# Patient Record
Sex: Female | Born: 1950 | Race: White | Hispanic: No | Marital: Single | Smoking: Former smoker
Health system: Southern US, Community
[De-identification: ages and names within clinical notes are randomized; demographics above are authoritative.]

## PROBLEM LIST (undated history)

## (undated) DIAGNOSIS — C9590 Leukemia, unspecified not having achieved remission: Secondary | ICD-10-CM

## (undated) DIAGNOSIS — F329 Major depressive disorder, single episode, unspecified: Secondary | ICD-10-CM

## (undated) DIAGNOSIS — I2510 Atherosclerotic heart disease of native coronary artery without angina pectoris: Secondary | ICD-10-CM

## (undated) DIAGNOSIS — S42309A Unspecified fracture of shaft of humerus, unspecified arm, initial encounter for closed fracture: Secondary | ICD-10-CM

## (undated) DIAGNOSIS — E7800 Pure hypercholesterolemia, unspecified: Secondary | ICD-10-CM

## (undated) DIAGNOSIS — F209 Schizophrenia, unspecified: Secondary | ICD-10-CM

## (undated) DIAGNOSIS — E039 Hypothyroidism, unspecified: Secondary | ICD-10-CM

## (undated) DIAGNOSIS — F32A Depression, unspecified: Secondary | ICD-10-CM

## (undated) DIAGNOSIS — R569 Unspecified convulsions: Secondary | ICD-10-CM

## (undated) DIAGNOSIS — I219 Acute myocardial infarction, unspecified: Secondary | ICD-10-CM

## (undated) DIAGNOSIS — G40909 Epilepsy, unspecified, not intractable, without status epilepticus: Secondary | ICD-10-CM

## (undated) DIAGNOSIS — F29 Unspecified psychosis not due to a substance or known physiological condition: Secondary | ICD-10-CM

## (undated) DIAGNOSIS — G4700 Insomnia, unspecified: Secondary | ICD-10-CM

## (undated) DIAGNOSIS — M79603 Pain in arm, unspecified: Secondary | ICD-10-CM

## (undated) DIAGNOSIS — J449 Chronic obstructive pulmonary disease, unspecified: Secondary | ICD-10-CM

## (undated) DIAGNOSIS — G8929 Other chronic pain: Secondary | ICD-10-CM

## (undated) DIAGNOSIS — G249 Dystonia, unspecified: Secondary | ICD-10-CM

## (undated) HISTORY — PX: EXTERNAL EAR SURGERY: SHX627

## (undated) HISTORY — PX: HUMERUS FRACTURE SURGERY: SHX670

## (undated) HISTORY — PX: SHOULDER SURGERY: SHX246

---

## 1999-12-07 ENCOUNTER — Emergency Department (HOSPITAL_COMMUNITY): Admission: EM | Admit: 1999-12-07 | Discharge: 1999-12-07 | Payer: Self-pay | Admitting: Emergency Medicine

## 1999-12-17 ENCOUNTER — Encounter: Payer: Self-pay | Admitting: Emergency Medicine

## 1999-12-17 ENCOUNTER — Emergency Department (HOSPITAL_COMMUNITY): Admission: EM | Admit: 1999-12-17 | Discharge: 1999-12-18 | Payer: Self-pay | Admitting: Emergency Medicine

## 2000-02-05 ENCOUNTER — Emergency Department (HOSPITAL_COMMUNITY): Admission: EM | Admit: 2000-02-05 | Discharge: 2000-02-05 | Payer: Self-pay | Admitting: Emergency Medicine

## 2000-03-05 ENCOUNTER — Emergency Department (HOSPITAL_COMMUNITY): Admission: EM | Admit: 2000-03-05 | Discharge: 2000-03-05 | Payer: Self-pay | Admitting: Emergency Medicine

## 2000-04-02 ENCOUNTER — Encounter: Payer: Self-pay | Admitting: Emergency Medicine

## 2000-04-02 ENCOUNTER — Emergency Department (HOSPITAL_COMMUNITY): Admission: EM | Admit: 2000-04-02 | Discharge: 2000-04-02 | Payer: Self-pay | Admitting: Emergency Medicine

## 2000-05-11 ENCOUNTER — Emergency Department (HOSPITAL_COMMUNITY): Admission: EM | Admit: 2000-05-11 | Discharge: 2000-05-11 | Payer: Self-pay | Admitting: Emergency Medicine

## 2000-05-12 ENCOUNTER — Emergency Department (HOSPITAL_COMMUNITY): Admission: EM | Admit: 2000-05-12 | Discharge: 2000-05-12 | Payer: Self-pay | Admitting: Emergency Medicine

## 2000-05-24 ENCOUNTER — Emergency Department (HOSPITAL_COMMUNITY): Admission: EM | Admit: 2000-05-24 | Discharge: 2000-05-24 | Payer: Self-pay | Admitting: Emergency Medicine

## 2000-05-30 ENCOUNTER — Encounter: Payer: Self-pay | Admitting: Emergency Medicine

## 2000-05-30 ENCOUNTER — Emergency Department (HOSPITAL_COMMUNITY): Admission: EM | Admit: 2000-05-30 | Discharge: 2000-05-30 | Payer: Self-pay | Admitting: Emergency Medicine

## 2000-06-06 ENCOUNTER — Ambulatory Visit (HOSPITAL_COMMUNITY): Admission: RE | Admit: 2000-06-06 | Discharge: 2000-06-06 | Payer: Self-pay | Admitting: Family Medicine

## 2000-06-20 ENCOUNTER — Emergency Department (HOSPITAL_COMMUNITY): Admission: EM | Admit: 2000-06-20 | Discharge: 2000-06-20 | Payer: Self-pay | Admitting: Emergency Medicine

## 2000-06-27 ENCOUNTER — Inpatient Hospital Stay (HOSPITAL_COMMUNITY): Admission: EM | Admit: 2000-06-27 | Discharge: 2000-07-03 | Payer: Self-pay | Admitting: *Deleted

## 2000-07-04 ENCOUNTER — Emergency Department (HOSPITAL_COMMUNITY): Admission: EM | Admit: 2000-07-04 | Discharge: 2000-07-04 | Payer: Self-pay | Admitting: Emergency Medicine

## 2000-07-19 ENCOUNTER — Emergency Department (HOSPITAL_COMMUNITY): Admission: EM | Admit: 2000-07-19 | Discharge: 2000-07-19 | Payer: Self-pay

## 2000-09-19 ENCOUNTER — Emergency Department (HOSPITAL_COMMUNITY): Admission: EM | Admit: 2000-09-19 | Discharge: 2000-09-19 | Payer: Self-pay | Admitting: Emergency Medicine

## 2000-11-10 ENCOUNTER — Inpatient Hospital Stay (HOSPITAL_COMMUNITY): Admission: AD | Admit: 2000-11-10 | Discharge: 2000-11-21 | Payer: Self-pay | Admitting: *Deleted

## 2000-11-28 ENCOUNTER — Emergency Department (HOSPITAL_COMMUNITY): Admission: EM | Admit: 2000-11-28 | Discharge: 2000-11-28 | Payer: Self-pay

## 2000-12-18 ENCOUNTER — Observation Stay (HOSPITAL_COMMUNITY): Admission: EM | Admit: 2000-12-18 | Discharge: 2000-12-19 | Payer: Self-pay

## 2001-08-06 ENCOUNTER — Emergency Department (HOSPITAL_COMMUNITY): Admission: EM | Admit: 2001-08-06 | Discharge: 2001-08-06 | Payer: Self-pay | Admitting: Emergency Medicine

## 2001-08-08 ENCOUNTER — Inpatient Hospital Stay (HOSPITAL_COMMUNITY): Admission: EM | Admit: 2001-08-08 | Discharge: 2001-08-14 | Payer: Self-pay | Admitting: Emergency Medicine

## 2001-08-14 ENCOUNTER — Inpatient Hospital Stay (HOSPITAL_COMMUNITY): Admission: EM | Admit: 2001-08-14 | Discharge: 2001-08-30 | Payer: Self-pay | Admitting: Psychiatry

## 2001-08-17 ENCOUNTER — Encounter: Payer: Self-pay | Admitting: Emergency Medicine

## 2001-11-23 ENCOUNTER — Encounter: Admission: RE | Admit: 2001-11-23 | Discharge: 2002-02-21 | Payer: Self-pay | Admitting: Neurology

## 2002-07-23 ENCOUNTER — Encounter: Admission: RE | Admit: 2002-07-23 | Discharge: 2002-07-23 | Payer: Self-pay | Admitting: Sports Medicine

## 2002-07-30 ENCOUNTER — Encounter (INDEPENDENT_AMBULATORY_CARE_PROVIDER_SITE_OTHER): Payer: Self-pay | Admitting: Cardiology

## 2002-07-30 ENCOUNTER — Ambulatory Visit: Admission: RE | Admit: 2002-07-30 | Discharge: 2002-07-30 | Payer: Self-pay | Admitting: Sports Medicine

## 2002-08-13 ENCOUNTER — Encounter: Admission: RE | Admit: 2002-08-13 | Discharge: 2002-08-13 | Payer: Self-pay | Admitting: Sports Medicine

## 2002-09-10 ENCOUNTER — Encounter: Admission: RE | Admit: 2002-09-10 | Discharge: 2002-09-10 | Payer: Self-pay | Admitting: Sports Medicine

## 2002-09-17 ENCOUNTER — Encounter: Payer: Self-pay | Admitting: Oral Surgery

## 2002-09-17 ENCOUNTER — Ambulatory Visit (HOSPITAL_COMMUNITY): Admission: RE | Admit: 2002-09-17 | Discharge: 2002-09-17 | Payer: Self-pay | Admitting: Oral Surgery

## 2002-10-24 ENCOUNTER — Encounter: Payer: Self-pay | Admitting: Oral Surgery

## 2002-10-29 ENCOUNTER — Ambulatory Visit (HOSPITAL_COMMUNITY): Admission: RE | Admit: 2002-10-29 | Discharge: 2002-10-29 | Payer: Self-pay | Admitting: Oral Surgery

## 2002-11-01 ENCOUNTER — Ambulatory Visit (HOSPITAL_COMMUNITY): Admission: RE | Admit: 2002-11-01 | Discharge: 2002-11-01 | Payer: Self-pay | Admitting: Oral Surgery

## 2003-02-05 ENCOUNTER — Encounter: Admission: RE | Admit: 2003-02-05 | Discharge: 2003-02-05 | Payer: Self-pay | Admitting: Family Medicine

## 2003-03-25 ENCOUNTER — Encounter: Admission: RE | Admit: 2003-03-25 | Discharge: 2003-03-25 | Payer: Self-pay | Admitting: Sports Medicine

## 2003-04-15 ENCOUNTER — Encounter: Admission: RE | Admit: 2003-04-15 | Discharge: 2003-04-15 | Payer: Self-pay | Admitting: Sports Medicine

## 2003-05-15 ENCOUNTER — Inpatient Hospital Stay (HOSPITAL_COMMUNITY): Admission: EM | Admit: 2003-05-15 | Discharge: 2003-05-18 | Payer: Self-pay | Admitting: Emergency Medicine

## 2003-06-17 ENCOUNTER — Emergency Department (HOSPITAL_COMMUNITY): Admission: EM | Admit: 2003-06-17 | Discharge: 2003-06-17 | Payer: Self-pay | Admitting: Emergency Medicine

## 2003-06-26 ENCOUNTER — Encounter: Admission: RE | Admit: 2003-06-26 | Discharge: 2003-06-26 | Payer: Self-pay | Admitting: Sports Medicine

## 2003-07-03 ENCOUNTER — Ambulatory Visit (HOSPITAL_COMMUNITY): Admission: RE | Admit: 2003-07-03 | Discharge: 2003-07-03 | Payer: Self-pay | Admitting: Oral Surgery

## 2003-07-15 ENCOUNTER — Encounter: Admission: RE | Admit: 2003-07-15 | Discharge: 2003-07-15 | Payer: Self-pay | Admitting: Sports Medicine

## 2003-08-20 ENCOUNTER — Encounter: Admission: RE | Admit: 2003-08-20 | Discharge: 2003-08-20 | Payer: Self-pay | Admitting: Family Medicine

## 2003-10-24 ENCOUNTER — Encounter: Admission: RE | Admit: 2003-10-24 | Discharge: 2003-10-24 | Payer: Self-pay | Admitting: Family Medicine

## 2003-11-19 ENCOUNTER — Encounter: Admission: RE | Admit: 2003-11-19 | Discharge: 2003-11-19 | Payer: Self-pay | Admitting: Sports Medicine

## 2004-02-19 ENCOUNTER — Emergency Department (HOSPITAL_COMMUNITY): Admission: EM | Admit: 2004-02-19 | Discharge: 2004-02-19 | Payer: Self-pay | Admitting: Emergency Medicine

## 2004-04-13 ENCOUNTER — Ambulatory Visit: Payer: Self-pay | Admitting: Sports Medicine

## 2004-05-05 ENCOUNTER — Ambulatory Visit: Payer: Self-pay | Admitting: Sports Medicine

## 2004-10-13 ENCOUNTER — Ambulatory Visit: Payer: Self-pay | Admitting: Sports Medicine

## 2004-12-17 ENCOUNTER — Emergency Department (HOSPITAL_COMMUNITY): Admission: EM | Admit: 2004-12-17 | Discharge: 2004-12-17 | Payer: Self-pay | Admitting: Family Medicine

## 2005-07-14 ENCOUNTER — Emergency Department (HOSPITAL_COMMUNITY): Admission: EM | Admit: 2005-07-14 | Discharge: 2005-07-14 | Payer: Self-pay | Admitting: Family Medicine

## 2005-08-09 ENCOUNTER — Encounter (INDEPENDENT_AMBULATORY_CARE_PROVIDER_SITE_OTHER): Payer: Self-pay | Admitting: *Deleted

## 2005-08-09 LAB — CONVERTED CEMR LAB

## 2005-08-18 ENCOUNTER — Emergency Department (HOSPITAL_COMMUNITY): Admission: EM | Admit: 2005-08-18 | Discharge: 2005-08-18 | Payer: Self-pay | Admitting: Family Medicine

## 2005-08-23 ENCOUNTER — Ambulatory Visit: Payer: Self-pay | Admitting: Sports Medicine

## 2005-08-23 ENCOUNTER — Encounter (INDEPENDENT_AMBULATORY_CARE_PROVIDER_SITE_OTHER): Payer: Self-pay | Admitting: Sports Medicine

## 2005-09-12 ENCOUNTER — Emergency Department (HOSPITAL_COMMUNITY): Admission: EM | Admit: 2005-09-12 | Discharge: 2005-09-12 | Payer: Self-pay | Admitting: Family Medicine

## 2005-10-28 ENCOUNTER — Emergency Department (HOSPITAL_COMMUNITY): Admission: EM | Admit: 2005-10-28 | Discharge: 2005-10-28 | Payer: Self-pay | Admitting: Emergency Medicine

## 2006-03-09 ENCOUNTER — Ambulatory Visit (HOSPITAL_BASED_OUTPATIENT_CLINIC_OR_DEPARTMENT_OTHER): Admission: RE | Admit: 2006-03-09 | Discharge: 2006-03-09 | Payer: Self-pay | Admitting: Urology

## 2006-06-08 DIAGNOSIS — F209 Schizophrenia, unspecified: Secondary | ICD-10-CM | POA: Insufficient documentation

## 2006-06-08 DIAGNOSIS — E039 Hypothyroidism, unspecified: Secondary | ICD-10-CM

## 2006-06-09 ENCOUNTER — Encounter (INDEPENDENT_AMBULATORY_CARE_PROVIDER_SITE_OTHER): Payer: Self-pay | Admitting: *Deleted

## 2006-12-07 ENCOUNTER — Telehealth: Payer: Self-pay | Admitting: *Deleted

## 2006-12-14 ENCOUNTER — Encounter: Payer: Self-pay | Admitting: Family Medicine

## 2007-04-25 ENCOUNTER — Telehealth: Payer: Self-pay | Admitting: *Deleted

## 2007-07-11 ENCOUNTER — Ambulatory Visit: Payer: Self-pay | Admitting: Family Medicine

## 2007-07-11 LAB — CONVERTED CEMR LAB
ALT: 28 units/L (ref 0–35)
AST: 27 units/L (ref 0–37)
CO2: 23 meq/L (ref 19–32)
Calcium: 9.8 mg/dL (ref 8.4–10.5)
Chloride: 107 meq/L (ref 96–112)
Creatinine, Ser: 0.95 mg/dL (ref 0.40–1.20)
Potassium: 4.4 meq/L (ref 3.5–5.3)
Sodium: 139 meq/L (ref 135–145)
TSH: 9.99 microintl units/mL — ABNORMAL HIGH (ref 0.350–5.50)
Total Protein: 7.1 g/dL (ref 6.0–8.3)

## 2007-07-12 ENCOUNTER — Telehealth: Payer: Self-pay | Admitting: Family Medicine

## 2007-07-12 ENCOUNTER — Encounter: Payer: Self-pay | Admitting: Family Medicine

## 2007-08-06 ENCOUNTER — Encounter: Payer: Self-pay | Admitting: Family Medicine

## 2007-08-21 ENCOUNTER — Encounter: Payer: Self-pay | Admitting: Family Medicine

## 2007-09-05 ENCOUNTER — Ambulatory Visit: Payer: Self-pay | Admitting: Family Medicine

## 2007-09-24 ENCOUNTER — Encounter: Payer: Self-pay | Admitting: Family Medicine

## 2008-07-25 ENCOUNTER — Telehealth: Payer: Self-pay | Admitting: *Deleted

## 2008-07-31 ENCOUNTER — Encounter: Payer: Self-pay | Admitting: Sports Medicine

## 2008-07-31 ENCOUNTER — Ambulatory Visit: Payer: Self-pay | Admitting: Family Medicine

## 2008-07-31 LAB — CONVERTED CEMR LAB
Cholesterol: 246 mg/dL — ABNORMAL HIGH (ref 0–200)
HDL: 47 mg/dL (ref >39)
LDL Cholesterol: 140 mg/dL — ABNORMAL HIGH (ref 0–99)
TSH: 9.23 microintl units/mL — ABNORMAL HIGH (ref 0.350–4.500)
Total CHOL/HDL Ratio: 5.2
Triglycerides: 297 mg/dL — ABNORMAL HIGH (ref <150)
VLDL: 59 mg/dL — ABNORMAL HIGH (ref 0–40)

## 2008-08-11 ENCOUNTER — Encounter: Payer: Self-pay | Admitting: Family Medicine

## 2008-09-04 ENCOUNTER — Encounter: Payer: Self-pay | Admitting: Family Medicine

## 2008-10-15 ENCOUNTER — Telehealth: Payer: Self-pay | Admitting: *Deleted

## 2008-10-21 ENCOUNTER — Ambulatory Visit: Payer: Self-pay | Admitting: Family Medicine

## 2008-10-21 ENCOUNTER — Encounter: Payer: Self-pay | Admitting: Sports Medicine

## 2008-10-22 LAB — CONVERTED CEMR LAB: TSH: 0.554 u[IU]/mL

## 2008-11-13 ENCOUNTER — Encounter: Payer: Self-pay | Admitting: Family Medicine

## 2008-12-02 ENCOUNTER — Encounter: Payer: Self-pay | Admitting: Sports Medicine

## 2008-12-02 ENCOUNTER — Ambulatory Visit: Payer: Self-pay | Admitting: Family Medicine

## 2008-12-05 LAB — CONVERTED CEMR LAB: TSH: 9.364 u[IU]/mL — ABNORMAL HIGH (ref 0.350–4.500)

## 2008-12-09 ENCOUNTER — Ambulatory Visit: Payer: Self-pay | Admitting: Family Medicine

## 2008-12-09 ENCOUNTER — Encounter: Payer: Self-pay | Admitting: Sports Medicine

## 2008-12-10 LAB — CONVERTED CEMR LAB: TSH: 4.939 microintl units/mL — ABNORMAL HIGH (ref 0.350–4.500)

## 2009-01-19 ENCOUNTER — Ambulatory Visit: Payer: Self-pay | Admitting: Family Medicine

## 2009-01-19 ENCOUNTER — Encounter: Payer: Self-pay | Admitting: Sports Medicine

## 2009-01-19 LAB — CONVERTED CEMR LAB
BUN: 20 mg/dL (ref 6–23)
CO2: 21 meq/L (ref 19–32)
Calcium: 9.3 mg/dL (ref 8.4–10.5)
Chloride: 106 meq/L (ref 96–112)
Creatinine, Ser: 0.82 mg/dL (ref 0.40–1.20)
Glucose, Bld: 94 mg/dL (ref 70–99)
Potassium: 4.4 meq/L (ref 3.5–5.3)
Sodium: 141 meq/L (ref 135–145)
TSH: 0.516 microintl units/mL (ref 0.350–4.500)

## 2009-03-09 ENCOUNTER — Telehealth (INDEPENDENT_AMBULATORY_CARE_PROVIDER_SITE_OTHER): Payer: Self-pay | Admitting: Family Medicine

## 2009-07-23 ENCOUNTER — Emergency Department (HOSPITAL_COMMUNITY): Admission: EM | Admit: 2009-07-23 | Discharge: 2009-07-23 | Payer: Self-pay | Admitting: Family Medicine

## 2009-08-11 ENCOUNTER — Encounter: Admission: RE | Admit: 2009-08-11 | Discharge: 2009-08-11 | Payer: Self-pay | Admitting: Orthopedic Surgery

## 2009-09-08 ENCOUNTER — Ambulatory Visit: Payer: Self-pay | Admitting: Family Medicine

## 2009-09-08 ENCOUNTER — Encounter: Payer: Self-pay | Admitting: Family Medicine

## 2009-09-08 LAB — CONVERTED CEMR LAB
Glucose, Urine, Semiquant: NEGATIVE
Nitrite: NEGATIVE
Specific Gravity, Urine: >=1.03
Urobilinogen, UA: 1
pH: 5.5

## 2009-09-16 ENCOUNTER — Encounter: Payer: Self-pay | Admitting: Sports Medicine

## 2009-09-16 ENCOUNTER — Ambulatory Visit: Payer: Self-pay | Admitting: Family Medicine

## 2009-09-16 LAB — CONVERTED CEMR LAB
Bilirubin Urine: NEGATIVE
Blood in Urine, dipstick: NEGATIVE
Glucose, Urine, Semiquant: NEGATIVE
Ketones, urine, test strip: NEGATIVE
Nitrite: NEGATIVE
Protein, U semiquant: NEGATIVE
Specific Gravity, Urine: 1.025
TSH: 0.183 microintl units/mL — ABNORMAL LOW (ref 0.350–4.500)
Urobilinogen, UA: 0.2
WBC Urine, dipstick: NEGATIVE
pH: 6

## 2009-09-24 ENCOUNTER — Telehealth: Payer: Self-pay | Admitting: Sports Medicine

## 2009-10-29 ENCOUNTER — Ambulatory Visit: Payer: Self-pay | Admitting: Family Medicine

## 2009-11-03 ENCOUNTER — Telehealth: Payer: Self-pay | Admitting: Sports Medicine

## 2009-11-04 ENCOUNTER — Ambulatory Visit: Payer: Self-pay | Admitting: Family Medicine

## 2009-11-04 ENCOUNTER — Encounter: Payer: Self-pay | Admitting: Family Medicine

## 2009-11-05 ENCOUNTER — Telehealth: Payer: Self-pay | Admitting: *Deleted

## 2009-11-24 ENCOUNTER — Ambulatory Visit: Payer: Self-pay | Admitting: Family Medicine

## 2009-11-26 ENCOUNTER — Encounter: Admission: RE | Admit: 2009-11-26 | Discharge: 2009-11-26 | Payer: Self-pay | Admitting: Orthopedic Surgery

## 2009-12-07 ENCOUNTER — Encounter: Payer: Self-pay | Admitting: Sports Medicine

## 2009-12-07 ENCOUNTER — Ambulatory Visit: Payer: Self-pay | Admitting: Family Medicine

## 2009-12-07 LAB — CONVERTED CEMR LAB: TSH: 2.857 microintl units/mL (ref 0.350–4.500)

## 2009-12-08 ENCOUNTER — Encounter: Admission: RE | Admit: 2009-12-08 | Discharge: 2009-12-08 | Payer: Self-pay | Admitting: Family Medicine

## 2009-12-09 ENCOUNTER — Encounter: Payer: Self-pay | Admitting: *Deleted

## 2009-12-22 ENCOUNTER — Ambulatory Visit (HOSPITAL_COMMUNITY): Admission: RE | Admit: 2009-12-22 | Discharge: 2009-12-22 | Payer: Self-pay | Admitting: Sports Medicine

## 2009-12-22 ENCOUNTER — Encounter: Payer: Self-pay | Admitting: Sports Medicine

## 2009-12-24 ENCOUNTER — Encounter: Payer: Self-pay | Admitting: *Deleted

## 2009-12-25 ENCOUNTER — Encounter: Payer: Self-pay | Admitting: Sports Medicine

## 2009-12-25 ENCOUNTER — Ambulatory Visit: Payer: Self-pay | Admitting: Family Medicine

## 2009-12-25 LAB — CONVERTED CEMR LAB: Pro B Natriuretic peptide (BNP): 9.8 pg/mL (ref 0.0–100.0)

## 2010-01-20 ENCOUNTER — Encounter: Payer: Self-pay | Admitting: Sports Medicine

## 2010-02-10 ENCOUNTER — Emergency Department (HOSPITAL_COMMUNITY)
Admission: EM | Admit: 2010-02-10 | Discharge: 2010-02-13 | Payer: Self-pay | Source: Home / Self Care | Admitting: Emergency Medicine

## 2010-02-15 ENCOUNTER — Encounter: Payer: Self-pay | Admitting: Sports Medicine

## 2010-02-27 ENCOUNTER — Ambulatory Visit: Payer: Self-pay | Admitting: Psychiatry

## 2010-04-20 ENCOUNTER — Encounter: Payer: Self-pay | Admitting: Sports Medicine

## 2010-05-11 NOTE — Miscellaneous (Signed)
  Clinical Lists Changes  Problems: Removed problem of OTHER DISEASES OF RESPIRATORY SYSTEM NEC (ICD-519.8) Removed problem of UTI (ICD-599.0) Removed problem of SHOULDER PAIN (ICD-719.41) Removed problem of DYSURIA (ICD-788.1) Removed problem of MUSCLE SPASM (ICD-728.85) Removed problem of UNSPECIFIED DISORER NERVOUS SYSTEM&SENSE ORGANS (ICD-V12.40)

## 2010-05-11 NOTE — Assessment & Plan Note (Signed)
Summary: ? UTI & Bronchitis,tcb   Vital Signs:  Patient profile:   60 year old female Weight:      188.8 pounds Temp:     98.8 degrees F oral Pulse rate:   101 / minute Pulse rhythm:   regular BP sitting:   109 / 70  (left arm) Cuff size:   regular  Vitals Entered By: Loralee Pacas CMA (Sep 08, 2009 11:28 AM)  Primary Care Provider:  Rodney Langton MD   History of Present Illness: Bruning with urination, history of urological problems.  Followed in past by [place] MD in [facility], CMG test reported normal one year ago.  Past bladder sling procedure, had been on prophylaxis antibiotics in past.  Denies fever, vomiting, or abdominal pain.  Flexeril not effective in controlling pain from shoulder injury.  She describes it as 'tonic/clonic spasms", she would like to try another muscle relaxant.  She would like to see Dr. Albertha Ghee at [facility], as she knows her from the past and would like her to review her films for a second opinion.  Has another orthopod who told her there is noting that can be done.  She wears an immobilizer.  She believes that she has bronchitis.  She has cough and congestion for weeks.    Current Medications (verified): 1)  Levothroid 125 Mcg Tabs (Levothyroxine Sodium) .... One Tab By Mouth Daily 2)  Zocor 80 Mg Tabs (Simvastatin) .... One Tab By Mouth At Bedtime 3)  Albuterol 90 Mcg/act  Aers (Albuterol) .Marland Kitchen.. 1-2 Puffs Up To Three Times A Day Prn 4)  Neurontin .... 900mg  By Mouth Three Times A Day 5)  Cogentin 1 Mg/ml Soln (Benztropine Mesylate) .... 6 Mg By Mouth Once Daily 6)  Temazepam 15 Mg  Caps (Temazepam) .... One Tab By Mouth Qhs 7)  Abilify 20 Mg  Tabs (Aripiprazole) .Marland Kitchen.. 1 By Mouth Once Daily Per Psychiatry 8)  Effexor Xr 150 Mg  Cp24 (Venlafaxine Hcl) .... Two Times A Day Per Psychiatry 9)  Diphenhydramine .... 50mg  By Mouth Qam, 50mg  By Mouth Midday, 125 By Mouth Qhs 10)  Gas-X Extra Strength 125 Mg Chew (Simethicone) .... One Daily 11)   Valtrex 500 Mg Tabs (Valacyclovir Hcl) .... One Tab By Mouth Daily 12)  Skelaxin 800 Mg Tabs (Metaxalone) .... One Three Times A Day As Needed For Muscle Spasm 13)  Levaquin 500 Mg Tabs (Levofloxacin) .... One Daily For 7 Days 14)  Mucinex 600 Mg Xr12h-Tab (Guaifenesin) .... One Two Times A Day As Needed For Congestion, 1 Box  Allergies (verified): 1)  Amoxicillin (Amoxicillin)  Review of Systems General:  Denies fever. Resp:  Complains of cough, sputum productive, and wheezing.  Physical Exam  General:  Moderately anxious, alert, talkative 60 year old Lungs:  normal respiratory effort, expiratory wheeze, inspiratory rhonchi, frequent cough Heart:  normal rate and regular rhythm.   Genitalia:  catheterized for small amount of cloudy, reddish urine:  + WBC,  +RBC, sent for culture   Impression & Recommendations:  Problem # 1:  UTI (ICD-599.0)  Wanted to add something that would cover her chronic bronchitis and urine, subsequently Levaquin. Her updated medication list for this problem includes:    Levaquin 500 Mg Tabs (Levofloxacin) ..... 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 1-2 puffs up to three times a day prn  Orders: [facility]- Est  Level 4 (57846)  Problem # 3:  SHOULDER PAIN (ICD-719.41) per her request refered for second opinion to [facility], she has her films on dic in her purse The following medications were removed from the medication list:    Flexeril 5 Mg Tabs (Cyclobenzaprine hcl) ..... One tab by mouth tid Her updated medication list for this problem includes:    Skelaxin 800 Mg Tabs (Metaxalone) ..... One three times a day as needed for muscle spasm  Problem # 4:  MUSCLE SPASM (ICD-728.85) requested another relaxant, trial of skelaxin,  follow up with primary MD Orders: Orthopedic Referral (Ortho) [facility]- Est  Level 4 (96295)  Complete Medication List: 1)  Levothroid 125 Mcg Tabs (Levothyroxine sodium) .... One tab by mouth daily 2)  Zocor 80 Mg Tabs (Simvastatin) .... One tab by mouth at bedtime 3)  Albuterol 90 Mcg/act Aers (Albuterol) .Marland Kitchen.. 1-2 puffs up to three times a day prn 4)  Neurontin  .... 900mg  by mouth three times a day 5)  Cogentin 1 Mg/ml Soln (Benztropine mesylate) .... 6 mg by mouth once daily 6)  Temazepam 15 Mg Caps (Temazepam) .... One tab by mouth qhs 7)  Abilify 20 Mg Tabs (Aripiprazole) .Marland Kitchen.. 1 by mouth once daily per psychiatry 8)  Effexor Xr 150 Mg Cp24 (Venlafaxine hcl) .... Two times a day per psychiatry 9)  Diphenhydramine  .... 50mg  by mouth qam, 50mg  by mouth midday, 125 by mouth qhs 10)  Gas-x Extra Strength 125 Mg Chew (Simethicone) .... One daily 11)  Valtrex 500 Mg Tabs (Valacyclovir hcl) .... One tab by mouth daily 12)  Skelaxin 800 Mg Tabs (Metaxalone) .... One three times a day as needed for muscle spasm 13)  Levaquin 500 Mg Tabs (Levofloxacin) .... One daily for 7 days 14)  Mucinex 600 Mg Xr12h-tab (Guaifenesin) .... One two times a day as needed for congestion, 1 box  Other Orders: Urinalysis-[facility] (00000) Urine Culture-[facility] ([id number])  Patient Instructions: 1)  Mucinex to thin secreations 2)  Use inhaller every 4 hours 3)  Use all of antibiotics to treat your urinary infection and lungs 4)  Drink a lot of water-8-10 glasses per day 5)  Trial of skelaxin muscle relaxant 6)  Apt wtih Dr. Serena Colonel Prescriptions: SKELAXIN 800 MG TABS (METAXALONE) one three times a day as needed for muscle spasm Brand medically necessary #90 x 0   Entered and Authorized by:   Luretha Murphy NP   Signed by:   Luretha Murphy NP on 09/08/2009   Method used:   Printed then faxed to ...       [facility] (retail)       [address]       [place], [place]  [id number]       Ph:  [phone]       Fax: [phone]   RxID:   [id number] MUCINEX 600 MG XR12H-TAB (GUAIFENESIN) one two times a day as needed for congestion, 1 box Brand medically necessary #1 x 11   Entered and Authorized by:   Luretha Murphy NP   Signed by:   Luretha Murphy NP on 09/08/2009   Method used:   Printed then faxed to ...       [facility] (retail)       [address]       [place], [place]  [id number]  Ph: [phone]       Fax: [phone]   RxID:   [id number] LEVAQUIN 500 MG TABS (LEVOFLOXACIN) one daily for 7 days Brand medically necessary #7 x 0   Entered and Authorized by:   Luretha Murphy NP   Signed by:   Luretha Murphy NP on 09/08/2009   Method used:   Printed then faxed to ...       [facility] (retail)       [address]       [place], [place]  [address]       Ph: [phone]       Fax: [phone]   RxID:   [id number] SKELAXIN 800 MG TABS (METAXALONE) one three times a day as needed for muscle spasm  #90 x 0   Entered and Authorized by:   Luretha Murphy NP   Signed by:   Luretha Murphy NP on 09/08/2009   Method used:   Printed then faxed to ...       [facility] (retail)       [address]       [place], [place]  [address]       Ph: [phone]       Fax: [phone]   RxID:   [id number]   Laboratory Results   Urine Tests  Date/Time Received: Sep 08, 2009 11:31 AM  Date/Time Reported: Sep 08, 2009 12:12 PM   Routine Urinalysis   Color: brown Appearance: Cloudy Glucose: negative   (Normal Range: Negative) Bilirubin: small-icto negative   (Normal Range: Negative) Ketone: small (15)   (Normal Range: Negative) Spec. Gravity: >=1.030   (Normal Range: 1.003-1.035) Blood: large   (Normal Range: Negative) pH: 5.5   (Normal Range: 5.0-8.0) Protein: >=300   (Normal Range: Negative) Urobilinogen: 1.0   (Normal Range: 0-1) Nitrite: negative   (Normal Range:  Negative) Leukocyte Esterace: small   (Normal Range: Negative)  Urine Microscopic WBC/HPF: >20 RBC/HPF: >20 Bacteria/HPF: 3+ Epithelial/HPF: 5-10    Comments: cath sample. urine sent for culture ...........test performed by...........Marland KitchenTerese Door, CMA

## 2010-05-11 NOTE — Progress Notes (Signed)
----   Converted from flag ---- ---- 11/04/2009 7:18 PM, Jamie Brookes MD wrote: Pt needs more personal care services. She is already getting them for about 2 hours a day from [facility] [phone]). I have put in an order for her to get more. Let me know if there is anything else I need to do. Thanks ------------------------------  [id number] for a change of status for to be faxed over. Then can request more pt time. Jimmy Footman, CMA  Filled out papers and put on Jimmy Footman desk. Jamie Brookes MD  November 06, 2009 9:10 AM  faxed forms out.Jimmy Footman, CMA  November 06, 2009 10:43 AM     New Problems: FRACTURE, ARM, RIGHT (ICD-818.0)   New Problems: FRACTURE, ARM, RIGHT (ICD-818.0)

## 2010-05-11 NOTE — Letter (Signed)
Summary: Generic Letter  [facility]  [address]   [place], [place] [address]   Phone: [phone]  Fax: [phone]    12/09/2009  Dominique Acosta [address] [place], [place]  [address]  Dear Ms. Zimmermann,   I have made an appointment for you to have an 2D Echo done on 09.13.2011 at 10am at [facility]. You will need to report to 1st floor admitting at 945am BEFORE YOU GO TO HAVE YOUR ECHO DONE.  Should you have any questions  please call our office.  Thank you in advance for your time and attention to this matter.        Sincerely,   Loralee Pacas CMA

## 2010-05-11 NOTE — Assessment & Plan Note (Signed)
 Summary: thyroid /meds/kh   Vital Signs:  Patient profile:   60 year old female Weight:      194.3 pounds Temp:     98.7 degrees F oral Pulse rate:   99 / minute BP sitting:   107 / 69  (left arm)  Vitals Entered By: Letitia Reusing (October 21, 2008 2:26 PM) CC: thyroid  meds Is Patient Diabetic? No   Primary Care Provider:  Camie Mulch MD  CC:  thyroid  meds.  History of Present Illness: Pt comes in to discuss thyroid  meds.  Saw me in april 2010, I checked TSH, found it to still be high and increased levothyroxine  from 88mcg to 100mcg.  She did not follow up with PCP as desired, missed appt, and returns to me needing a refill of meds.  No palpitations, restlessness, heat intolerance, but does have fatigue, occasional constipation.  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 EOMI. Perrla. No lid lag. Neck:  No palpable masses. Lungs:  Normal respiratory effort, chest expands symmetrically. Lungs are clear to auscultation, no crackles or wheezes. Heart:  Normal rate and regular rhythm. S1 and S2 normal without gallop, murmur, click, rub or other extra sounds.   Impression & Recommendations:  Problem # 1:  HYPOTHYROIDISM, UNSPECIFIED (ICD-244.9) Assessment Improved Will check TSH today, increase  Levothyroidfrom 100 mcg to 125 micrograms if still elevated TSH.  Will also have pt come back in 6 weeks to check another TSH, then follow up with me AFTER that to readjust med dosage if needed.  Her updated medication list for this problem includes:    Levothroid 100 Mcg Tabs (Levothyroxine  sodium) ..... One tab by mouth daily  Orders: TSH-[facility] ([id number]) [facility]- Est Level  3 (00786)  Future Orders: TSH-[facility] ([id number]) ... 12/05/2008  Complete Medication List: 1)  Levothroid 100 Mcg Tabs (Levothyroxine  sodium) .... One tab by mouth daily 2)  Zocor  80 Mg Tabs (Simvastatin ) .... One tab by mouth at bedtime 3)  Albuterol  90 Mcg/act Aers (Albuterol ) .SABRA.. 1-2 puffs up to three times a day prn 4)  Neurontin   .... 900mg  by mouth three times a day 5)  Cogentin  1 Mg/ml Soln (Benztropine  mesylate) .... 6 mg by mouth once daily 6)  Temazepam  15 Mg Caps (Temazepam ) .... One tab by mouth qhs 7)  Abilify  20 Mg Tabs (Aripiprazole ) .SABRA.. 1 by mouth once daily per psychiatry 8)  Seroquel  Xr 150 Mg Xr24h-tab (Quetiapine  fumarate) .... One tab by mouth qhs 9)  Trazodone Hcl 100 Mg Tabs (Trazodone hcl) .... Per psychiatry 10)  Effexor  Xr 150 Mg Cp24 (Venlafaxine  hcl) .... Two times a day per psychiatry 11)  Diphenhydramine   .... 50mg  by mouth qam, 50mg  by mouth midday, 125 by mouth qhs 12)  Gas-x Extra Strength 125 Mg Chew (Simethicone) .... One daily 13)  Valtrex  500 Mg Tabs (Valacyclovir  hcl) .... One tab by mouth daily  Patient Instructions: 1)  Great to see you again today, 2)  We will check your TSH today, then I will call you and call in the new dosage of your Levothyroxine  if needed. 3)  Make an appt at the front to come back to see me in 6 weeks.  I would like you to come and make a lab appt just prior to your visit to check your TSH so that I know the results PRIOR to your appt. 4)  -Dr. ONEIDA.

## 2010-05-11 NOTE — Assessment & Plan Note (Signed)
 Summary: FU/KH   Vital Signs:  Patient profile:   60 year old female Height:      60 inches Weight:      192.2 pounds BMI:     37.67 Temp:     99.4 degrees F oral Pulse rate:   100 / minute BP sitting:   118 / 76  (left arm) Cuff size:   regular  Vitals Entered By: Chrissie Lerner CMA, (December 09, 2008 11:42 AM) CC: f/u Is Patient Diabetic? No Pain Assessment Patient in pain? no        Primary Care Provider:  Debby Petties MD  CC:  f/u.  History of Present Illness: 79F here for fu of hypothyroidism.   TSH was in the 9's, then dropped to 0.5 after taking levothyroid 100, now back to 9's.  Pt feels sluggish.  Denies skipping doses, states she has taken her levothyroid every day.  No other complaints.  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,  frequent uti--on trimethoprim  daily, --sees UROL Dr Roseann in [facility] Psych doctor at mental health  Past Surgical History: Last updated: 07/11/2007 Echo-normal - 08/20/2002,  tooth extraction--gen anaesthia - 09/10/2002  Family History: Last updated: 09/05/2007 cad--dad mother breast ca in her 74s  Social History: Last updated: 09/05/2007 Lives alone-[facility] housing ACT team visits 3x per week Father helps her with medicationepenses Smokes 2 ppd  Social History: Smoking Status:  never  Physical Exam  General:  Well-developed,well-nourished,in no acute distress; alert,appropriate and cooperative throughout examination Eyes:  No corneal or conjunctival inflammation noted. EOMI. Perrla. No lid lag. Lungs:  Normal respiratory effort, chest expands symmetrically. Lungs are clear to auscultation, no crackles or wheezes. Heart:   Normal rate and regular rhythm. S1 and S2 normal without gallop, murmur, click, rub or other extra sounds. Extremities:  No pretibial myxedema.   Impression & Recommendations:  Problem # 1:  HYPOTHYROIDISM, UNSPECIFIED (ICD-244.9) Assessment Deteriorated Unclear why TSH was normal, then immediately increased if pt was compliant.  Will recheck TSH today and refill levothyroid with 125mcg form.  If TSH still elevated then can just recheck in 6 weeks.  If normal then will go back to 100mcg form.  Her updated medication list for this problem includes:    Levothroid 125 Mcg Tabs (Levothyroxine  sodium) ..... One tab by mouth daily  Orders: TSH-[facility] ([id number]) [facility]- Est Level  3 ([address])Future Orders: TSH-[facility] ([id number]) ... 12/09/2009  Complete Medication List: 1)  Levothroid 125 Mcg Tabs (Levothyroxine  sodium) .... One tab by mouth daily 2)  Zocor  80 Mg Tabs (Simvastatin ) .... One tab by mouth at bedtime 3)  Albuterol  90 Mcg/act Aers (Albuterol ) .SABRA.. 1-2 puffs up to three times a day prn 4)  Neurontin   .... 900mg  by mouth three times a day 5)  Cogentin  1 Mg/ml Soln (Benztropine  mesylate) .... 6 mg by mouth once daily 6)  Temazepam  15 Mg Caps (Temazepam ) .... One tab by mouth qhs 7)  Abilify  20 Mg Tabs (Aripiprazole ) .SABRA.. 1 by mouth once daily per psychiatry 8)  Seroquel  Xr 150 Mg Xr24h-tab (Quetiapine  fumarate) .... One tab by mouth qhs 9)  Trazodone Hcl 100 Mg Tabs (Trazodone hcl) .... Per psychiatry 10)  Effexor  Xr 150 Mg Cp24 (Venlafaxine  hcl) .... Two times a day per psychiatry  11)  Diphenhydramine   .... 50mg  by mouth qam, 50mg  by mouth midday, 125 by mouth qhs 12)  Gas-x Extra Strength 125 Mg Chew (Simethicone) .... One daily 13)  Valtrex  500 Mg Tabs (Valacyclovir  hcl) .... One tab by mouth daily  Patient Instructions: 1)  Good to see you today, 2)  lets check another TSH to make sure there was no lab error and increase your medication to 125mcg daily. 3)  I want you to come  back for a lab visit in 6 weeks to check your TSH again to see how the dose has helped.  Go to the front to set this up. 4)  -Dr. ONEIDA. Prescriptions: LEVOTHROID 125 MCG TABS (LEVOTHYROXINE  SODIUM) One tab by mouth daily  #45 x 0   Entered and Authorized by:   Debby Petties MD   Signed by:   Debby Petties MD on 12/09/2008   Method used:   Electronically to        [facility] (retail)       [address]       [place], [place]  [id number]       Ph: [phone]       Fax: [phone]   RxID:   [id number]

## 2010-05-11 NOTE — Progress Notes (Signed)
Summary: triage  Phone Note Call from Patient Call back at Home Phone [phone]   Caller: Patient Summary of Call: Pt seen last week and told to call if not feeling better this week. Initial call taken by: Clydell Hakim,  November 03, 2009 3:46 PM  Follow-up for Phone Call        took the doxy. this is her last day & still has it. she thinks it is from the smoking & wants patches prescribed. work in tomorrow at 3 with Dr. Clotilde Dieter Follow-up by: Golden Circle RN,  November 03, 2009 3:46 PM  Additional Follow-up for Phone Call Additional follow up Details #1::        Noted, to be seen tomo. Additional Follow-up by: Rodney Langton MD,  November 03, 2009 9:53 PM

## 2010-05-11 NOTE — Assessment & Plan Note (Signed)
Summary: wants patches to stop smoking/[place]/t   Vital Signs:  Patient profile:   60 year old female Weight:      183 pounds Temp:     98.6 degrees F Pulse rate:   99 / minute BP sitting:   101 / 67  (left arm)  Vitals Entered By: Theresia Lo RN (November 04, 2009 3:21 PM) CC: talk about smoking cessation, f/u bronchitis   Primary Care Provider:  Rodney Langton MD  CC:  talk about smoking cessation and f/u bronchitis.  History of Present Illness: Smoking Cessation: Pt comes in today after having chronic bronchitis since Nov (per her report) and recently being on antibiotics again for bronchitis. She has continued to smoke and has actually increased her smoking in the past few months (death in family, mom put in nursing home) to 2.5 - 3 ppd. She has recently broken her arm and says some of the meds she is taking for the pain is making her smoke even more. She has tried to quit in the past and lasted 6 weeks. She has never asked for help before but wants to know about meds to help her quit. She has not had a cig since last night and says she is tired and irritable and really wants a cigarette. She is interested in the patches.   Chronic Bronchitis: Pt just finished 14 days of Doxy and is feeling like she still is sick. She continues to cough and says she feels like she is going to lapse back into an infection. No fevers, no chills, pos cough, pos fatigue.  Habits & Providers  Alcohol-Tobacco-Diet     Tobacco Status: current     Cigarette Packs/Day: 3.0  Current Medications (verified): 1)  Levothroid 112 Mcg Tabs (Levothyroxine Sodium) .... One Tab By Mouth Daily 2)  Zocor 80 Mg Tabs (Simvastatin) .... One Tab By Mouth At Bedtime 3)  Albuterol 90 Mcg/act  Aers (Albuterol) .Marland Kitchen.. 1-2 Puffs Up To Three Times A Day Prn 4)  Neurontin .... 900mg  By Mouth Three Times A Day 5)  Cogentin 1 Mg/ml Soln (Benztropine Mesylate) .... 6 Mg By Mouth Once Daily 6)  Temazepam 15 Mg  Caps (Temazepam)  .... One Tab By Mouth Qhs 7)  Abilify 20 Mg  Tabs (Aripiprazole) .Marland Kitchen.. 1 By Mouth Once Daily Per Psychiatry 8)  Effexor Xr 150 Mg  Cp24 (Venlafaxine Hcl) .... Two Times A Day Per Psychiatry 9)  Diphenhydramine .... 50mg  By Mouth Qam, 50mg  By Mouth Midday, 125 By Mouth Qhs 10)  Gas-X Extra Strength 125 Mg Chew (Simethicone) .... One Daily 11)  Valtrex 500 Mg Tabs (Valacyclovir Hcl) .... One Tab By Mouth Daily 12)  Skelaxin 800 Mg Tabs (Metaxalone) .... One Three Times A Day As Needed For Muscle Spasm 13)  Levaquin 500 Mg Tabs (Levofloxacin) .... One Daily For 7 Days 14)  Mucinex 600 Mg Xr12h-Tab (Guaifenesin) .... One Two Times A Day As Needed For Congestion, 1 Box 15)  Doxycycline Hyclate 100 Mg Caps (Doxycycline Hyclate) .... Take One Two Times A Day By Mouth 16)  Promethazine Hcl 12.5 Mg Tabs (Promethazine Hcl) .... Take One Q6 Hours As Needed Nausea 17)  Nicotine 21 Mg/24hr Pt24 (Nicotine) .... Place One Patch On Daily and Take The Other One Off, Do This For 6 Weeks. 18)  Ra Nicotine Polacrilex 4 Mg Lozg (Nicotine Polacrilex) .... Use 1 Lozenge Every 1-2 Hours, Continue For 6 Weeks.  Allergies (verified): 1)  ! Dilaudid (Hydromorphone Hcl) 2)  Amoxicillin (  Amoxicillin)  Family History: cad--dad mother breast ca in her 73s  Social History: Lives alone-[employer] housing ACT team visits 1x q2 weeks Father helps her with medication expenses Smokes 2.5-3 ppdPacks/Day:  3.0  Review of Systems        vitals reviewed and pertinent negatives and positives seen in HPI   Physical Exam  General:  Well-developed,well-nourished,in no acute distress; alert,appropriate and cooperative throughout examination Lungs:  left posterior lung sounds have rhonchi, otherwise clear throughout.  Heart:  Normal rate and regular rhythm. S1 and S2 normal without gallop, murmur, click, rub or other extra sounds. Psych:  very anxious and jittery.    Impression & Recommendations:  Problem # 1:  CIGARETTE SMOKER  (ICD-305.1) Assessment Deteriorated Pt really wants to quit smoking. Can't have surgery on arm unless she quits. She is using 2.5-3 ppd. She wants to try the patches, suggested using the lozenges as well to make up the difference. Discussed with Paulino Rily. Would consider adding Welbutrin to her regimine (even though she is on other antidepressants) if she is still having a hard time with mood and fatigue next time. Pt to see Paulino Rily in 1 week, then see an MD in 4-5 weeks so we can start to taper her meds at week 6.   Her updated medication list for this problem includes:    Nicotine 21 Mg/24hr Pt24 (Nicotine) .Marland Kitchen... Place one patch on daily and take the other one off, do this for 6 weeks.    Ra Nicotine Polacrilex 4 Mg Lozg (Nicotine polacrilex) ..... Use 1 lozenge every 1-2 hours, continue for 6 weeks.  Orders: [facility]- Est Level  3 (27253)  Problem # 2:  BRONCHITIS, CHRONIC (ICD-491.9) Assessment: Unchanged Pt is still having symptoms. Never went to get her CXR. Still having rhonchi in her left post lung. Plan to extend treatment by 1 week.   Orders: [facility]- Est Level  3 (66440)  Complete Medication List: 1)  Levothroid 112 Mcg Tabs (Levothyroxine sodium) .... One tab by mouth daily 2)  Zocor 80 Mg Tabs (Simvastatin) .... One tab by mouth at bedtime 3)  Albuterol 90 Mcg/act Aers (Albuterol) .Marland Kitchen.. 1-2 puffs up to three times a day prn 4)  Neurontin  .... 900mg  by mouth three times a day 5)  Cogentin 1 Mg/ml Soln (Benztropine mesylate) .... 6 mg by mouth once daily 6)  Temazepam 15 Mg Caps (Temazepam) .... One tab by mouth qhs 7)  Abilify 20 Mg Tabs (Aripiprazole) .Marland Kitchen.. 1 by mouth once daily per psychiatry 8)  Effexor Xr 150 Mg Cp24 (Venlafaxine hcl) .... Two times a day per psychiatry 9)  Diphenhydramine  .... 50mg  by mouth qam, 50mg  by mouth midday, 125 by mouth qhs 10)  Gas-x Extra Strength 125 Mg Chew (Simethicone) .... One daily 11)  Valtrex 500 Mg Tabs (Valacyclovir hcl) .... One tab by  mouth daily 12)  Skelaxin 800 Mg Tabs (Metaxalone) .... One three times a day as needed for muscle spasm 13)  Levaquin 500 Mg Tabs (Levofloxacin) .... One daily for 7 days 14)  Mucinex 600 Mg Xr12h-tab (Guaifenesin) .... One two times a day as needed for congestion, 1 box 15)  Doxycycline Hyclate 100 Mg Caps (Doxycycline hyclate) .... Take one two times a day by mouth 16)  Promethazine Hcl 12.5 Mg Tabs (Promethazine hcl) .... Take one q6 hours as needed nausea 17)  Nicotine 21 Mg/24hr Pt24 (Nicotine) .... Place one patch on daily and take the other one off, do this  for 6 weeks. 18)  Ra Nicotine Polacrilex 4 Mg Lozg (Nicotine polacrilex) .... Use 1 lozenge every 1-2 hours, continue for 6 weeks.  Patient Instructions: 1)  Call us for anything.  2)  If you are doing great make an appointment at week 4-5 so we can adjust doses.  3)  Make an appointment with Paulino Rily (pharmacist) in our office in 1 week.  Prescriptions: DOXYCYCLINE HYCLATE 100 MG CAPS (DOXYCYCLINE HYCLATE) take one two times a day by mouth  #7 x 0   Entered and Authorized by:   Jamie Brookes MD   Signed by:   Jamie Brookes MD on 11/04/2009   Method used:   Electronically to        [facility]* (retail)       [address]       [place], [place]  [address]       Ph: [phone]       Fax: [phone]   RxID:   [id number] RA NICOTINE POLACRILEX 4 MG LOZG (NICOTINE POLACRILEX) use 1 lozenge every 1-2 hours, continue for 6 weeks.  #450 x 1   Entered and Authorized by:   Jamie Brookes MD   Signed by:   Jamie Brookes MD on 11/04/2009   Method used:   Electronically to        [facility]* (retail)       [address]       [place], [place]  [address]       Ph: [phone]       Fax: [phone]   RxID:   [id number] NICOTINE 21 MG/24HR PT24 (NICOTINE) place one patch on daily and take the other one off, do this for 6 weeks.  #30 x 1   Entered and Authorized  by:   Jamie Brookes MD   Signed by:   Jamie Brookes MD on 11/04/2009   Method used:   Electronically to        [facility]* (retail)       [address]       [place], [place]  [address]       Ph: [phone]       Fax: [phone]   RxID:   [id number]

## 2010-05-11 NOTE — Assessment & Plan Note (Signed)
 Summary: med ck,tcb   Vital Signs:  Patient profile:   60 year old female Weight:      193.4 pounds Temp:     98. degrees F Pulse rate:   101 / minute BP sitting:   94 / 67  (left arm)  Vitals Entered By: Letitia Reusing (July 31, 2008 3:45 PM)  CC: med check Is Patient Diabetic? No   History of Present Illness: 69 female with HLD, Hypothyroid, schozophrenia comes in because she needs a refill of her thyroid  medication.  Hasn't been seen in almost a year by Dr. Rosalynn.  Hypothyroidism:  Last TSH was 9.9 one year ago, hasn't had TSH checked since.  Is feeling somewhat fatigued and feels like she cannot lose weight.  No palpitations, no noticeable changes in her eyes, no noticeable changes in her pre-tibial skin.  Occasional constipation, no diarrhea.  No complaints of feeling cold or warm when others feel the opposite.  No HA, SOB, CP, N/V.  No other complaints.  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  Physical Exam  General:  Well-developed,well-nourished,in no acute distress; alert,appropriate and cooperative throughout  examination Eyes:  Lateral Portion of eyebrows thinning.  No lid lag or exophthalmos.  EOMI Neck:  No deformities, masses, or tenderness noted.  Thyroid  gland non-palpable. Lungs:  Normal respiratory effort, chest expands symmetrically. Lungs are clear to auscultation, no crackles or wheezes. Heart:  Normal rate and regular rhythm. S1 and S2 normal without gallop, murmur, click, rub or other extra sounds. Abdomen:  Bowel sounds positive,abdomen soft and non-tender without masses, organomegaly or hernias noted. Extremities:  No clubbing, cyanosis, edema, or deformity noted with normal full range of motion of all joints.     Impression & Recommendations:  Problem # 1:  HYPOTHYROIDISM, UNSPECIFIED (ICD-244.9) Will check TSH today and have pt follow up with Dr. Rosalynn at her next available slot.  Can adjust Levothyroxine  as indicated by TSH.  No signs hyperthyroidism.  Will refill just enough to last until her next appt with Dr. Rosalynn on May 19.  Her updated medication list for this problem includes:    Levothroid 88 Mcg Tabs (Levothyroxine  sodium) .SABRA... 1 by mouth once daily  Orders: [facility]- Est Level  3 (99213) TSH-[facility] ([address]19)  Problem # 2:  HYPERLIPIDEMIA (ICD-272.4) Pt has not had lipid panel checked in a long time.  Will draw it today so that results are ready when pt sees PCP.  Her updated medication list for  this problem includes:    Zocor  40 Mg Tabs (Simvastatin ) .SABRA... Take 1 tablet by mouth at bedtime  Orders: [facility]- Est Level  3 (99213) Lipid-[facility] ([id number])  Complete Medication List: 1)  Levothroid 88 Mcg Tabs (Levothyroxine  sodium) .SABRA.. 1 by mouth once daily 2)  Prilosec 20 Mg Cpdr (Omeprazole) .... Not taking due to expense 3)  Zocor  40 Mg Tabs (Simvastatin ) .... Take 1 tablet by mouth at bedtime 4)  Albuterol  90 Mcg/act Aers (Albuterol ) .SABRA.. 1-2 puffs up to three times a day prn 5)  Celebrex 200 Mg Caps (Celecoxib) .SABRA.. 1 by mouth once daily 6)  Acyclovir  200 Mg Caps  (Acyclovir ) .SABRA.. 1 by mouth once daily 7)  Neurontin  800 Mg Tabs (Gabapentin ) .SABRA.. 1 by mouth three times a day for neuropathy 8)  Enablex  7.5 Mg Tb24 (Darifenacin  hydrobromide) .SABRA.. 1 by mouth once daily per urology 9)  Cogentin   .... Dose per psychiatry 10)  Temazepam  15 Mg Caps (Temazepam ) .... Dose per psychiatry 11)  Abilify  20 Mg Tabs (Aripiprazole ) .SABRA.. 1 by mouth once daily per psychiatry 12)  Seroquel   .... Dose is  once daily per psychiatry 13)  Trazodone Hcl 100 Mg Tabs (Trazodone hcl) .... Per psychiatry 14)  Effexor  Xr 150 Mg Cp24 (Venlafaxine  hcl) .... Two times a day per psychiatry  Patient Instructions: 1)  Great to meet you today. 2)  The physical exam of your thyroid  was normal but because you haven't had a TSH level done in some time I will check another today.  I also want to check your cholesterol so that the results are ready when you go to see Dr. Rosalynn at your next appt. 3)  I will also refill your thyroid  medicine but please be aware that we may have to change the dosage after getting your TSH levels back. 4)  Please come back to see DR NEAL at her next available spot for follow-up of your Thyroid  levels, cholesterol, and other health maintenance items. 5)  -Dr. ONEIDA. Prescriptions: LEVOTHROID 88 MCG  TABS (LEVOTHYROXINE  SODIUM) 1 by mouth once daily  #30 x 0   Entered and Authorized by:   Debby Petties MD   Signed by:   Debby Petties MD on 07/31/2008   Method used:   Electronically to        [facility]* (retail)       [address]       [place], [place]  [id number]       Ph: [phone]       Fax: [phone]   RxID:   [id number]

## 2010-05-11 NOTE — Miscellaneous (Signed)
Summary: re: 2-D Echo/ts  Clinical Lists Changes tried to call pt. re: 2-D Echo is normal. Phone # is disconnected. pt has upcoming appt for labs tomorrow.Arlyss Repress CMA,  December 24, 2009 3:53 PM   Pt informed of nl echo, and is aware that she is to have labs drawn today.Loralee Pacas CMA  December 25, 2009 10:08 AM

## 2010-05-11 NOTE — Assessment & Plan Note (Signed)
Summary: Dominique Acosta   Vital Signs:  Patient profile:   60 year old female Weight:      187.9 pounds Temp:     98.5 degrees F oral Pulse rate:   103 / minute Pulse rhythm:   regular BP sitting:   113 / 71  (left arm) Cuff size:   regular  Vitals Entered By: Loralee Pacas CMA (September 16, 2009 10:04 AM)  Primary Care Provider:  Rodney Langton MD   History of Present Illness: Pt arrived late for appt.  Discussed only Hypothyroidism.  Hypothyroid:  On synthroid.  Due for TSH.  no AE's.  Urine:  Recent UTI tx with levoquin.  UCx sensitive.  Demanded I test for another since she still had some dysuria.  Pt thinks she has bronchitis.  Asked to reschedule to talk about this and hoarseness.   Current Medications (verified): 1)  Levothroid 125 Mcg Tabs (Levothyroxine Sodium) .... One Tab By Mouth Daily 2)  Zocor 80 Mg Tabs (Simvastatin) .... One Tab By Mouth At Bedtime 3)  Albuterol 90 Mcg/act  Aers (Albuterol) .Marland Kitchen.. 1-2 Puffs Up To Three Times A Day Prn 4)  Neurontin .... 900mg  By Mouth Three Times A Day 5)  Cogentin 1 Mg/ml Soln (Benztropine Mesylate) .... 6 Mg By Mouth Once Daily 6)  Temazepam 15 Mg  Caps (Temazepam) .... One Tab By Mouth Qhs 7)  Abilify 20 Mg  Tabs (Aripiprazole) .Marland Kitchen.. 1 By Mouth Once Daily Per Psychiatry 8)  Effexor Xr 150 Mg  Cp24 (Venlafaxine Hcl) .... Two Times A Day Per Psychiatry 9)  Diphenhydramine .... 50mg  By Mouth Qam, 50mg  By Mouth Midday, 125 By Mouth Qhs 10)  Gas-X Extra Strength 125 Mg Chew (Simethicone) .... One Daily 11)  Valtrex 500 Mg Tabs (Valacyclovir Hcl) .... One Tab By Mouth Daily 12)  Skelaxin 800 Mg Tabs (Metaxalone) .... One Three Times A Day As Needed For Muscle Spasm 13)  Levaquin 500 Mg Tabs (Levofloxacin) .... One Daily For 7 Days 14)  Mucinex 600 Mg Xr12h-Tab (Guaifenesin) .... 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,  frequent uti--on trimethoprim daily, --sees UROL Dr Pete Glatter in [facility] Psych doctor at mental health  Past Surgical History: Last updated: 07/11/2007 Echo-normal - 08/20/2002,  tooth extraction--gen anaesthia - 09/10/2002  Family History: Last updated: 09/05/2007 cad--dad mother breast ca in her 25s  Social History: Last updated: 09/05/2007 Lives alone-[employer] housing ACT team visits 3x per week Father helps her with medicationepenses Smokes 2 ppd  Review of Systems       See HPI  Physical Exam  General:  Well-developed,well-nourished,in no acute distress; alert,appropriate and cooperative throughout examination Neck:  No deformities, masses, or tenderness noted. Lungs:  Normal respiratory effort, chest expands symmetrically. Lungs are clear to auscultation, no crackles or wheezes. Heart:  Normal rate and regular rhythm. S1 and S2 normal without gallop, murmur, click, rub or other extra sounds.   Impression & Recommendations:  Problem # 1:  HYPOTHYROIDISM, UNSPECIFIED (ICD-244.9) Assessment Unchanged TSH today.  Her updated medication list for this problem includes:    Levothroid 125 Mcg Tabs (Levothyroxine sodium) ..... One tab by mouth daily  Orders: TSH-[facility] ([id number]) [facility]- Est  Level 4 (09811)  Problem # 2:  DYSURIA (ICD-788.1) Assessment: Improved UA  neg.  Her updated medication list for this problem includes:    Levaquin 500 Mg Tabs (Levofloxacin) ..... One daily for 7 days  Orders: Urinalysis-[facility] (00000) [facility]- Est  Level 4 ([address])  Complete Medication List: 1)  Levothroid 125 Mcg Tabs (Levothyroxine sodium) .... One tab by mouth daily 2)  Zocor 80 Mg Tabs (Simvastatin) .... One tab by mouth at bedtime 3)  Albuterol 90 Mcg/act Aers (Albuterol) .Marland Kitchen.. 1-2 puffs up to three times a day  prn 4)  Neurontin  .... 900mg  by mouth three times a day 5)  Cogentin 1 Mg/ml Soln (Benztropine mesylate) .... 6 mg by mouth once daily 6)  Temazepam 15 Mg Caps (Temazepam) .... One tab by mouth qhs 7)  Abilify 20 Mg Tabs (Aripiprazole) .Marland Kitchen.. 1 by mouth once daily per psychiatry 8)  Effexor Xr 150 Mg Cp24 (Venlafaxine hcl) .... Two times a day per psychiatry 9)  Diphenhydramine  .... 50mg  by mouth qam, 50mg  by mouth midday, 125 by mouth qhs 10)  Gas-x Extra Strength 125 Mg Chew (Simethicone) .... One daily 11)  Valtrex 500 Mg Tabs (Valacyclovir hcl) .... One tab by mouth daily 12)  Skelaxin 800 Mg Tabs (Metaxalone) .... One three times a day as needed for muscle spasm 13)  Levaquin 500 Mg Tabs (Levofloxacin) .... One daily for 7 days 14)  Mucinex 600 Mg Xr12h-tab (Guaifenesin) .... One two times a day as needed for congestion, 1 box  Laboratory Results   Urine Tests  Date/Time Received: September 16, 2009 10:30 AM  Date/Time Reported: September 16, 2009 10:35 AM   Routine Urinalysis   Color: yellow Appearance: Clear Glucose: negative   (Normal Range: Negative) Bilirubin: negative   (Normal Range: Negative) Ketone: negative   (Normal Range: Negative) Spec. Gravity: 1.025   (Normal Range: 1.003-1.035) Blood: negative   (Normal Range: Negative) pH: 6.0   (Normal Range: 5.0-8.0) Protein: negative   (Normal Range: Negative) Urobilinogen: 0.2   (Normal Range: 0-1) Nitrite: negative   (Normal Range: Negative) Leukocyte Esterace: negative   (Normal Range: Negative)    Comments: ...........test performed by...........Marland KitchenTerese Door, CMA

## 2010-05-11 NOTE — Assessment & Plan Note (Signed)
 Summary: f/u thyroid    Vital Signs:  Patient profile:   60 year old female Height:      60 inches Weight:      195.13 pounds Temp:     99.8 degrees F oral Pulse rate:   109 / minute BP sitting:   114 / 69  (right arm)  Vitals Entered By: Arland Morel (January 19, 2009 1:53 PM) CC: F/U thyroid  Is Patient Diabetic? No Pain Assessment Patient in pain? yes     Location: generalized muscle Intensity: 7 Type: spasm   Primary Care Provider:  Debby Petties MD  CC:  F/U thyroid .  History of Present Illness: 55F here for Fu of hypothyroidism, also with some muscle spasm.  Hypothyroid:  Last TSH in the 4's, synthroid  increased from 100 to 125 micrograms, pt feeling MUCH better after increase in dose.  Some jitteriness, rapid speech, but no palpitations, swelling in LE, protrusion of eyes, heat intolerance or other hyperthyroid symptoms.  Muscle spasm:  Feels that her left bicep and right foot are spasming although she has excellent motion and can walk well.  Had used flexeril in the past and had excellent results with it.  Still using benztropine  as Rx'ed.  No other sites of spasm.  Habits & Providers  Alcohol-Tobacco-Diet     Tobacco Status: current     Cigarette Packs/Day: 0.5  Allergies: 1)  Amoxicillin (Amoxicillin)  Past History:  Past Medical History: Last updated: 07/11/2007 significant mental illness--?schizoaffective 295.7 vs schizophrenia vs bipolar vs combo disorganized thinking--paranoid,  dystonia, drug induced  333.7,  frequent uti--on trimethoprim  daily, --sees UROL Dr Roseann in [facility] Psych doctor at mental health  Past Surgical History: Last updated: 07/11/2007 Echo-normal - 08/20/2002,  tooth extraction--gen anaesthia - 09/10/2002  Family History: Last updated: 09/05/2007 cad--dad mother breast ca in her 41s  Social History: Last updated: 09/05/2007 Lives alone-[employer] housing ACT team visits 3x per week Father helps her with  medicationepenses Smokes 2 ppd  Social History: Smoking Status:  current Packs/Day:  0.5  Review of Systems       See HPI  Physical Exam  General:  Well-developed,well-nourished,in no acute distress; alert,appropriate and cooperative throughout examination Eyes:  No corneal or conjunctival inflammation noted. EOMI. Perrla. No lid lag. Lungs:  Normal respiratory effort, chest expands symmetrically. Lungs are clear to auscultation, no crackles or wheezes. Heart:  Normal rate and regular rhythm. S1 and S2 normal without gallop, murmur, click, rub or other extra sounds. Msk:  No deformity or scoliosis noted of thoracic or lumbar spine.   Extremities:  No clubbing, cyanosis, edema, or deformity noted with normal full range of motion of all joints.   Skin:  Intact without suspicious lesions or rashes   Impression & Recommendations:  Problem # 1:  HYPOTHYROIDISM, UNSPECIFIED (ICD-244.9) Assessment Improved Symptoms greatly improved. No changes in dosage, check TSH today with symptoms of jitteriness, adjust meds as needed.  Her updated medication list for this problem includes:    Levothroid 125 Mcg Tabs (Levothyroxine  sodium) ..... One tab by mouth daily  Orders: TSH-[facility] (15556-76719) [facility]- Est Level  3 (00786)  Problem # 2:  MUSCLE SPASM (ICD-728.85) Assessment: New Hx Myotonia, Low Ca can also cause carpopedal spasm, pt with c/o pedal spasm.  Will check BMET for Ca levels, fill rx for flexeril as this has helped in the past.  Pt taking cogentin  as rx'ed.  RTC 6 weeks for re-eval.  Orders: Basic Met-[facility] ([id number]) [facility]- Est Level  3 (00786)  Complete Medication List: 1)  Levothroid 125 Mcg Tabs (Levothyroxine  sodium) .... One tab by mouth daily 2)  Zocor  80 Mg Tabs (Simvastatin ) .... One tab by mouth at bedtime 3)  Albuterol  90 Mcg/act Aers (Albuterol ) .SABRA.. 1-2 puffs up to three times a day prn 4)  Neurontin   .... 900mg  by mouth three times a day 5)  Cogentin  1 Mg/ml Soln  (Benztropine  mesylate) .... 6 mg by mouth once daily 6)  Temazepam  15 Mg Caps (Temazepam ) .... One tab by mouth qhs 7)  Abilify  20 Mg Tabs (Aripiprazole ) .SABRA.. 1 by mouth once daily per psychiatry 8)  Seroquel  Xr 150 Mg Xr24h-tab (Quetiapine  fumarate) .... One tab by mouth qhs 9)  Trazodone Hcl 100 Mg Tabs (Trazodone hcl) .... Per psychiatry 10)  Effexor  Xr 150 Mg Cp24 (Venlafaxine  hcl) .... Two times a day per psychiatry 11)  Diphenhydramine   .... 50mg  by mouth qam, 50mg  by mouth midday, 125 by mouth qhs 12)  Gas-x Extra Strength 125 Mg Chew (Simethicone) .... One daily 13)  Valtrex  500 Mg Tabs (Valacyclovir  hcl) .... One tab by mouth daily 14)  Flexeril 5 Mg Tabs (Cyclobenzaprine hcl) .... One tab by mouth tid  Patient Instructions: 1)  Good to see you today, 2)  Will check your TSH and calcium  levels. 3)  Rx'ed flexeril for one month. 4)  Come back to see me in 6 weeks. 5)  Make sure to take your cogentin  and temazepam . 6)  -Dr. ONEIDA. Prescriptions: FLEXERIL 5 MG TABS (CYCLOBENZAPRINE HCL) One tab by mouth TID  #90 x 0   Entered and Authorized by:   Debby Petties MD   Signed by:   Debby Petties MD on 01/19/2009   Method used:   Electronically to        [facility] (retail)       [address]       [place], [place]  [id number]       Ph: [phone]       Fax: [phone]   RxID:   [id number]

## 2010-05-11 NOTE — Assessment & Plan Note (Signed)
Summary: ? bronchitis,tcb   Vital Signs:  Patient profile:   60 year old female Height:      60 inches Weight:      187 pounds BMI:     36.65 BSA:     1.82 O2 Sat:      95 % on Room air Temp:     98.6 degrees F Pulse rate:   112 / minute BP sitting:   124 / 68  Vitals Entered By: Jone Baseman CMA (October 29, 2009 9:29 AM)  O2 Flow:  Room air CC: ? bronchitis Is Patient Diabetic? No Pain Assessment Patient in pain? no        Primary Care Provider:  Rodney Langton MD  CC:  ? bronchitis.  History of Present Illness: VS repeated and O2 sat was 97 and pulse was 91.  pt complains of 10 days of increased cough which is productive with thick sputum.  she has chills and sweats but no fevers.  she does complain of some reflux symptoms, but feels these are not related to her coughing.  This cough has been on and off since november.  she has never had a cxr.  she complains of nausea and is worried she is going to vomit.  she requests phenergan.  pt now open to smoking cessation and is down to 1/2 ppd.  gave her the handout on the class we have here.  Habits & Providers  Alcohol-Tobacco-Diet     Tobacco Status: current     Tobacco Counseling: to quit use of tobacco products     Cigarette Packs/Day: 0.5  Current Medications (verified): 1)  Levothroid 112 Mcg Tabs (Levothyroxine Sodium) .... One Tab By Mouth Daily 2)  Zocor 80 Mg Tabs (Simvastatin) .... One Tab By Mouth At Bedtime 3)  Albuterol 90 Mcg/act  Aers (Albuterol) .Marland Kitchen.. 1-2 Puffs Up To Three Times A Day Prn 4)  Neurontin .... 900mg  By Mouth Three Times A Day 5)  Cogentin 1 Mg/ml Soln (Benztropine Mesylate) .... 6 Mg By Mouth Once Daily 6)  Temazepam 15 Mg  Caps (Temazepam) .... One Tab By Mouth Qhs 7)  Abilify 20 Mg  Tabs (Aripiprazole) .Marland Kitchen.. 1 By Mouth Once Daily Per Psychiatry 8)  Effexor Xr 150 Mg  Cp24 (Venlafaxine Hcl) .... Two Times A Day Per Psychiatry 9)  Diphenhydramine .... 50mg  By Mouth Qam, 50mg  By  Mouth Midday, 125 By Mouth Qhs 10)  Gas-X Extra Strength 125 Mg Chew (Simethicone) .... One Daily 11)  Valtrex 500 Mg Tabs (Valacyclovir Hcl) .... One Tab By Mouth Daily 12)  Skelaxin 800 Mg Tabs (Metaxalone) .... One Three Times A Day As Needed For Muscle Spasm 13)  Levaquin 500 Mg Tabs (Levofloxacin) .... One Daily For 7 Days 14)  Mucinex 600 Mg Xr12h-Tab (Guaifenesin) .... One Two Times A Day As Needed For Congestion, 1 Box 15)  Doxycycline Hyclate 100 Mg Caps (Doxycycline Hyclate) .... Take One Two Times A Day By Mouth 16)  Promethazine Hcl 12.5 Mg Tabs (Promethazine Hcl) .... 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  The patient denies anorexia, fever, weight loss, chest pain, syncope, headaches, abdominal pain, hematochezia, muscle weakness, and difficulty walking.    Physical Exam  General:  Well-developed,well-nourished,in no acute distress; alert,appropriate and cooperative throughout examination Nose:  no external deformity, no external erythema, and no nasal discharge.   Mouth:  oropharynx clear without postnasal drip evident Lungs:  rhonchi throughout.  no crackles. no wheezes Heart:  normal rate and regular rhythm.   Abdomen:  soft, non-tender, and no masses.     Impression & Recommendations:  Problem # 1:  CHRONIC AIRWAY OBSTRUCTION NEC (ICD-496) Assessment Deteriorated reviewed old records  for this problem in our clinic.   while pt states this is related to bronchospasm due to dystonia, this is more likely to be related to her smoking.  She has been given albuterol, which she has stated has helped her.  Also, she has been offered smoking cessation several times in the past.   Today since pt has worsened and has not had a cxr, will get one today.  will start doxy for 7 days.  and phenergan for nausea.  gave red flags to RTC.   encouraged smoking cessation today and gave handout on class. Her updated medication list for this problem includes:    Albuterol 90 Mcg/act Aers (Albuterol) .Marland Kitchen... 1-2 puffs up to three times a day prn  Orders: CXR- 2view (CXR) [facility]- Est  Level 4 (60454)  Complete Medication List: 1)  Levothroid 112 Mcg Tabs (Levothyroxine sodium) .... One tab by mouth daily 2)  Zocor 80 Mg Tabs (Simvastatin) .... One tab by mouth at bedtime 3)  Albuterol 90 Mcg/act Aers (Albuterol) .Marland Kitchen.. 1-2 puffs up to three times a day prn 4)  Neurontin  .... 900mg  by mouth three times a day 5)  Cogentin 1 Mg/ml Soln (Benztropine mesylate) .... 6 mg by mouth once daily 6)  Temazepam 15 Mg Caps (Temazepam) .... One tab by mouth qhs 7)  Abilify 20 Mg Tabs (Aripiprazole) .Marland Kitchen.. 1 by mouth once daily per psychiatry 8)  Effexor Xr 150 Mg Cp24 (Venlafaxine hcl) .... Two times a day per psychiatry 9)  Diphenhydramine  .... 50mg  by mouth qam, 50mg  by mouth midday, 125 by mouth qhs 10)  Gas-x Extra Strength 125 Mg Chew (Simethicone) .... One daily 11)  Valtrex 500 Mg Tabs (Valacyclovir hcl) .... One tab by mouth daily 12)  Skelaxin 800 Mg Tabs (Metaxalone) .... One three times a day as needed for muscle spasm 13)  Levaquin 500 Mg Tabs (Levofloxacin) .... One daily for 7 days 14)  Mucinex 600 Mg Xr12h-tab (Guaifenesin) .... One two times a day as needed for congestion, 1 box 15)  Doxycycline Hyclate 100 Mg Caps (Doxycycline hyclate) .... Take one two times a day by mouth 16)  Promethazine Hcl 12.5  Mg Tabs (Promethazine hcl) .... Take one q6 hours as needed nausea  Patient Instructions: 1)  It was nice to meet you today. 2)  I am sending you for an xray.  I will call you when I have the result. 3)  I agree that this is probably bronchitis.  Take 7 days of antibiotic doxycycline and I have also sent a perscription for phenergan.  If you get fever or you are not feeling better in the next few days, call us back. 4)  THe air quality right now is bad for people with lung problems.  Try to stay inside. Prescriptions: PROMETHAZINE HCL 12.5 MG TABS (PROMETHAZINE HCL) take one q6 hours as needed nausea  #30 x 1   Entered and Authorized by:   Ellery Plunk MD   Signed by:   Ellery Plunk MD on 10/29/2009   Method used:   Electronically to        [facility] (retail)       [address]       [place], [place]  [address]       Ph: [phone]       Fax: [phone]   RxID:   [id number] DOXYCYCLINE HYCLATE 100 MG CAPS (DOXYCYCLINE HYCLATE) take one two times a day by mouth  #14 x 0   Entered and Authorized by:   Ellery Plunk MD   Signed by:   Ellery Plunk MD on 10/29/2009   Method used:   Electronically to        [facility] (retail)       [address]       [place], [place]  [address]       Ph: [phone]       Fax: [phone]   RxID:   [id number]

## 2010-05-11 NOTE — Miscellaneous (Signed)
Summary: referral for more personal care services.   Clinical Lists Changes  Orders: Added new Referral order of Home Health Referral [facility]) - Signed

## 2010-05-11 NOTE — Consult Note (Signed)
Summary: [facility]   Imported By: De Nurse 01/27/2010 15:45:06  _____________________________________________________________________  External Attachment:    Type:   Image     Comment:   External Document

## 2010-05-11 NOTE — Assessment & Plan Note (Signed)
Summary: Smoking Cessation - Rx Clinic   Vital Signs:  Patient profile:   60 year old female Height:      60 inches Weight:      185 pounds (84.09 kg) BMI:     36.26 Pulse rate:   104 / minute BP sitting:   116 / 24  (left arm) Cuff size:   regular  Primary Care Provider:  Rodney Langton MD   History of Present Illness: Dominique Acosta presenting to Rx clinic wanting to stop smoking. She has tried previously to quit cold [place] and most recently has tried the nicotine patch. She stopped the nicotine patch but her orthopedic doctor told her to stop.   Cold [place] did not work for her. She smokes 3 to 4 packs per day. Reports numerous amily issues that causes stress.  Habits & Providers  Alcohol-Tobacco-Diet     Tobacco Status: current     Year Started: 1964     Pack years: 144  Current Medications (verified): 1)  Levothroid 112 Mcg Tabs (Levothyroxine Sodium) .... One Tab By Mouth Daily 2)  Zocor 80 Mg Tabs (Simvastatin) .... One Tab By Mouth At Bedtime 3)  Albuterol 90 Mcg/act  Aers (Albuterol) .Marland Kitchen.. 1-2 Puffs Up To Three Times A Day Prn 4)  Neurontin .... 900mg  By Mouth Three Times A Day 5)  Cogentin 1 Mg/ml Soln (Benztropine Mesylate) .... 6 Mg By Mouth Once Daily 6)  Temazepam 15 Mg  Caps (Temazepam) .... One Tab By Mouth Qhs 7)  Abilify 20 Mg  Tabs (Aripiprazole) .Marland Kitchen.. 1 By Mouth Once Daily Per Psychiatry 8)  Effexor Xr 150 Mg  Cp24 (Venlafaxine Hcl) .... Two Times A Day Per Psychiatry 9)  Diphenhydramine .... 50mg  By Mouth Qam, 50mg  By Mouth Midday, 125 By Mouth Qhs 10)  Valtrex 500 Mg Tabs (Valacyclovir Hcl) .... One Tab By Mouth Daily 11)  Skelaxin 800 Mg Tabs (Metaxalone) .... One Three Times A Day As Needed For Muscle Spasm 12)  Promethazine Hcl 12.5 Mg Tabs (Promethazine Hcl) .... Take One Q6 Hours As Needed Nausea 13)  Buproban 150 Mg Xr12h-Tab (Bupropion Hcl (Smoking Deter)) .... One Daily in Am 14)  Nicotrol 10 Mg Inha (Nicotine) .... Use Up To 3 Cartridges Per Day.   Dispense One Box  Allergies (verified): 1)  ! 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 Patient also has history of leukemia and bronchitis and would like to quit to better her health. However motivation as of now more for the surgery. Long term success uncertain at this point but patient states she is willing to try. She previously tried the nicotine patch but orthopedic doctor told her to not use it. Patient also states she is bipolar and is on Abilify and effexor xr to treat. Started Buproprion and nictrol inhaler. Patient will  replace 2 cigarettes with the inhaler. Goal to decrease to 10 cigarettes/day x 1 week then decrease by 2 cigarettes each week. Will follow up with patient in 1 week by phone to ensure no issues with buproprion and the current psych regimen. TTFFC:  55 minutes.  Patient seen with: Lyna Poser, pharmD The following medications were removed from the medication list:    Nicotine 21 Mg/24hr Pt24 (Nicotine) .Marland Kitchen... Place one patch on daily and take the other one off, do this for 6 weeks.    Ra Nicotine Polacrilex 4 Mg Lozg (Nicotine polacrilex) ..... Use 1 lozenge every 1-2 hours, continue for 6 weeks. Her  updated medication list for this problem includes:    Buproban 150 Mg Xr12h-tab (Bupropion hcl (smoking deter)) ..... One daily in am    Nicotrol 10 Mg Inha (Nicotine) ..... Use up to 3 cartridges per day.  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   Will follow for change in mood.   Currently she is not being followed closely by the ACT team.   F/U with Dr. Briant Sites.   Complete Medication List: 1)  Levothroid 112 Mcg Tabs (Levothyroxine sodium) .... One tab by mouth daily 2)  Zocor 80 Mg Tabs (Simvastatin) .... One tab by mouth at bedtime 3)  Albuterol 90 Mcg/act Aers (Albuterol) .Marland Kitchen.. 1-2 puffs up to three times a day prn 4)  Neurontin  .... 900mg  by mouth three times a day 5)  Cogentin 1 Mg/ml Soln (Benztropine mesylate) .... 6 mg by mouth once daily 6)  Temazepam 15 Mg Caps (Temazepam) .... One tab by mouth qhs 7)  Abilify 20 Mg Tabs (Aripiprazole) .Marland Kitchen.. 1 by mouth once daily per psychiatry 8)  Effexor Xr 150 Mg Cp24 (Venlafaxine hcl) .... Two times a day per psychiatry 9)  Diphenhydramine  .... 50mg  by mouth qam, 50mg  by mouth midday, 125 by mouth qhs 10)  Valtrex 500 Mg Tabs (Valacyclovir hcl) .... One tab by mouth daily 11)  Skelaxin 800 Mg Tabs (Metaxalone) .... One three times a day as needed for muscle spasm 12)  Promethazine Hcl 12.5 Mg Tabs (Promethazine hcl) .... Take one q6 hours as needed nausea 13)  Buproban 150 Mg Xr12h-tab (Bupropion hcl (smoking deter)) .... One daily in am 14)  Nicotrol 10 Mg Inha (Nicotine) .... Use up to 3 cartridges per day.  dispense one box  Patient Instructions: 1)  Cut down to 10 cigarettes or less in the next week.  2)  Use the nicotrol inhaler to replace one cigarette per day or every other day to help you cut down.  3)  Start bupropion once daily in the morning.   4)  Return to Pharmacy Clinic in one  week.  Prescriptions: NICOTROL 10 MG INHA (NICOTINE) Use up to 3 cartridges per day.  Dispense one box  #1 x 1   Entered by:   Christian Mate D   Authorized by:   Rodney Langton MD   Signed by:   Madelon Lips Pharm D on 11/24/2009   Method used:   Electronically to        [facility] (retail)       [address]       [place], [place]  [address]       Ph: [phone]       Fax: [phone]  RxID:   [id number] BUPROBAN 150 MG XR12H-TAB (BUPROPION HCL (SMOKING DETER)) One daily in AM  #30 x 1   Entered by:   Christian Mate D   Authorized by:   Rodney Langton MD   Signed by:   Madelon Lips Pharm D on 11/24/2009   Method used:   Electronically to        [facility] (retail)       [address]       [place], [place]  [id number]       Ph: [phone]       Fax: [phone]   RxID:   [id number]   Tobacco Counseling   Currently uses tobacco.    Cigarettes      Year started smoking cigarettes:      1964     Number of packs per day smoked:      3.0     Years smoked:            48     Packs per year:          1095     Pack Years:            144  Counseled to quit/cut down on tobacco use.   Readiness to Quit:     Somewhat Ready Cessation Stage:     Contemplative Target Quit Date:     12/10/2009  Quitting Barriers:      -  fear of weight gain     -  enjoys smoking     -  feels addicted  Quitting Motivators:      -  poor health     -  other  Comments: Patient has to have orthopedic surgery in September and the physician told her she needed to quit. She is trying to quit before the surgery or at least cut down so she won't have decreased wound healing. For now she is trying a "cigarette diet" with hopes to quit in September.  Previous Quit Attempts   Comments: to quit use of tobacco products  Smoking Cessation Plan   Readiness:     Somewhat Ready Cessation Stage:   Contemplative Target Quit  Date:   12/10/2009 New Medications: BUPROBAN 150 MG XR12H-TAB (BUPROPION HCL (SMOKING DETER)) One daily in AM NICOTROL 10 MG INHA (NICOTINE) Use up to 3 cartridges per day.  Dispense one box  Medications Added this Update:  BUPROBAN 150 MG XR12H-TAB (BUPROPION HCL (SMOKING DETER)) One daily in AM NICOTROL 10 MG INHA (NICOTINE) Use up to 3 cartridges per day.  Dispense one box  Orders Added this Update:  Inital Assessment Each - [facility] [id number]   Prevention & Chronic Care Immunizations   Influenza vaccine: Not documented    Tetanus booster: Not documented    Pneumococcal vaccine: Not documented    H. zoster vaccine: Not documented  Colorectal Screening   Hemoccult: Not documented    Colonoscopy: Not documented  Other Screening   Pap smear: Done.  (08/09/2005)   Pap smear due: 08/10/2006    Mammogram: Not documented    DXA bone density scan: Not documented   Smoking status: current  (11/24/2009)   Smoking cessation counseling: yes  (11/24/2009)   Target quit date: 12/10/2009  (11/24/2009)  Lipids   Total Cholesterol: 246  (07/31/2008)   LDL: 140  (07/31/2008)   LDL Direct: Not documented   HDL: 47  (07/31/2008)   Triglycerides: 297  (07/31/2008)  SGOT (AST): 27  (07/11/2007)   SGPT (ALT): 28  (07/11/2007)   Alkaline phosphatase: 87  (07/11/2007)   Total bilirubin: 0.3  (07/11/2007)    Lipid flowsheet reviewed?: Yes   Progress toward LDL goal: Unchanged  Self-Management Support :   Personal Goals (by the next clinic visit) :      Personal LDL goal: 130  (11/24/2009)    Lipid self-management support: Not documented

## 2010-05-11 NOTE — Assessment & Plan Note (Signed)
Summary: F/U/KH   Vital Signs:  Patient profile:   59 year old female Weight:      186.5 pounds Temp:     99 degrees F oral Pulse rate:   98 / minute Pulse rhythm:   regular BP sitting:   108 / 76  (left arm) Cuff size:   regular  Vitals Entered By: Loralee Pacas CMA (December 07, 2009 3:29 PM)  Primary Care Neeley Sedivy:  Rodney Langton MD   History of Present Illness: 60 yo female with multiple issues.  Smoking: Recently in pharm clinic.  Provided with nicotine inhaler.  Advised to smoke 10 cigs a day and decrease by 2/d each week until she quits.  She thought this meant she could just smoke 10/day and keep smoking this amount forever.  She has also switched to cigars and smokes 2 cigars a day because the price is better.  She feels that the Wellbutrin has helped and her mood is actually much better.  Doesn't know if it has helped her smoking.  Cough:  Thinks she has bronchitis.  Was rx'ed abx at previous visit and symptoms improved.  Coughing, hoarse voice, refuses to accept that this is because of her smoking.  No fevers/chills, no SOB, no LE swelling.   Thyroid:  recently decreased synthroid due to low TSH approx 7-8wks ago.  Due for a recheck.  Current Medications (verified): 1)  Levothroid 112 Mcg Tabs (Levothyroxine Sodium) .... One Tab By Mouth Daily 2)  Zocor 80 Mg Tabs (Simvastatin) .... One Tab By Mouth At Bedtime 3)  Albuterol 90 Mcg/act  Aers (Albuterol) .Marland Kitchen.. 1-2 Puffs Up To Three Times A Day Prn 4)  Neurontin .... 900mg  By Mouth Three Times A Day 5)  Cogentin 1 Mg/ml Soln (Benztropine Mesylate) .... 6 Mg By Mouth Once Daily 6)  Temazepam 15 Mg  Caps (Temazepam) .... One Tab By Mouth Qhs 7)  Abilify 20 Mg  Tabs (Aripiprazole) .Marland Kitchen.. 1 By Mouth Once Daily Per Psychiatry 8)  Effexor Xr 150 Mg  Cp24 (Venlafaxine Hcl) .... Two Times A Day Per Psychiatry 9)  Diphenhydramine .... 50mg  By Mouth Qam, 50mg  By Mouth Midday, 125 By Mouth Qhs 10)  Valtrex 500 Mg Tabs (Valacyclovir  Hcl) .... One Tab By Mouth Daily 11)  Skelaxin 800 Mg Tabs (Metaxalone) .... One Three Times A Day As Needed For Muscle Spasm 12)  Promethazine Hcl 12.5 Mg Tabs (Promethazine Hcl) .... Take One Q6 Hours As Needed Nausea 13)  Buproban 150 Mg Xr12h-Tab (Bupropion Hcl (Smoking Deter)) .... Two Daily in Am 14)  Nicotrol 10 Mg Inha (Nicotine) .... Use Up To 3 Cartridges Per Day.  Dispense One Box 15)  Zithromax 250 Mg Tabs (Azithromycin) .... Two Tabs By Mouth On Day 1, Then One Tab By Mouth Daily For Days 2-5.  Allergies (verified): 1)  ! Dilaudid (Hydromorphone Hcl) 2)  Amoxicillin (Amoxicillin)  Review of Systems       See HPI  Physical Exam  General:  Well-developed,well-nourished,in no acute distress; alert,appropriate and cooperative throughout examination Neck:  No deformities, masses, or tenderness noted. Lungs:  Normal respiratory effort, chest expands symmetrically. Some crackles heard R lung fields.  No wheeze, no rhonchi. Heart:  Normal rate and regular rhythm. S1 and S2 normal without gallop, murmur, click, rub or other extra sounds. Abdomen:  Bowel sounds positive,abdomen soft and non-tender without masses, organomegaly or hernias noted. Extremities:  No edema.   Impression & Recommendations:  Problem # 1:  BRONCHITIS, CHRONIC (ICD-491.9)  Assessment Deteriorated With abnormal lung exam, will check CXR and tx for CAP with azithro.  Can stop azithro if CXR neg.  Orders: [facility]- Est  Level 4 (99214) CXR- 2view (CXR)  Problem # 2:  CIGARETTE SMOKER (ICD-305.1) Assessment: Deteriorated Will increase wellbutrin dose, pt does not want to fu with pharm appt this soon since we just talked about her smoking.  Advised that she change her pharm clinic appt for a few weeks from now.  Smoking cessation advise given.  Her updated medication list for this problem includes:    Buproban 150 Mg Xr12h-tab (Bupropion hcl (smoking deter)) .Marland Kitchen..Marland Kitchen Two daily in am    Nicotrol 10 Mg Inha  (Nicotine) ..... Use up to 3 cartridges per day.  dispense one box  Orders: [facility]- Est  Level 4 (99214)  Problem # 3:  HYPOTHYROIDISM, UNSPECIFIED (ICD-244.9) Assessment: Unchanged Recheck TSH today.  RTC 6 weeks.  Her updated medication list for this problem includes:    Levothroid 112 Mcg Tabs (Levothyroxine sodium) ..... One tab by mouth daily  Orders: TSH-[facility] ([address]366-44034) [facility]- Est  Level 4 (74259)  Complete Medication List: 1)  Levothroid 112 Mcg Tabs (Levothyroxine sodium) .... One tab by mouth daily 2)  Zocor 80 Mg Tabs (Simvastatin) .... One tab by mouth at bedtime 3)  Albuterol 90 Mcg/act Aers (Albuterol) .Marland Kitchen.. 1-2 puffs up to three times a day prn 4)  Neurontin  .... 900mg  by mouth three times a day 5)  Cogentin 1 Mg/ml Soln (Benztropine mesylate) .... 6 mg by mouth once daily 6)  Temazepam 15 Mg Caps (Temazepam) .... One tab by mouth qhs 7)  Abilify 20 Mg Tabs (Aripiprazole) .Marland Kitchen.. 1 by mouth once daily per psychiatry 8)  Effexor Xr 150 Mg Cp24 (Venlafaxine hcl) .... Two times a day per psychiatry 9)  Diphenhydramine  .... 50mg  by mouth qam, 50mg  by mouth midday, 125 by mouth qhs 10)  Valtrex 500 Mg Tabs (Valacyclovir hcl) .... One tab by mouth daily 11)  Skelaxin 800 Mg Tabs (Metaxalone) .... One three times a day as needed for muscle spasm 12)  Promethazine Hcl 12.5 Mg Tabs (Promethazine hcl) .... Take one q6 hours as needed nausea 13)  Buproban 150 Mg Xr12h-tab (Bupropion hcl (smoking deter)) .... Two daily in am 14)  Nicotrol 10 Mg Inha (Nicotine) .... Use up to 3 cartridges per day.  dispense one box 15)  Zithromax 250 Mg Tabs (Azithromycin) .... Two tabs by mouth on day 1, then one tab by mouth daily for days 2-5.  Patient Instructions: 1)  -Cut back your cigars by 1/2 a cigar weekly until you have stopped. 2)  -Chest Xray, Azithromycin. 3)  -Go to the front and cancel your appt with Dr. Raymondo Band: the pharmacist, I will send them a note letting them know what changes have  occurred, I would like you to make another appt with them. 4)  -Increasing your Wellbutrin. 5)  -Checking a TSH 6)  Come back to see me in 6 weeks. 7)  -Dr. Karie Schwalbe. Prescriptions: ZITHROMAX 250 MG TABS (AZITHROMYCIN) Two tabs by mouth on day 1, then One tab by mouth daily for days 2-5.  #6 x 0   Entered and Authorized by:   Rodney Langton MD   Signed by:   Rodney Langton MD on 12/07/2009   Method used:   Electronically to        [facility] (retail)       [address]  [place], [place]  [id number]       Ph: [phone]       Fax: [phone]   RxID:   [id number]   Appended Document: F/U/KH    Clinical Lists Changes  Medications: Changed medication from BUPROBAN 150 MG XR12H-TAB (BUPROPION HCL (SMOKING DETER)) Two daily in AM to BUPROPION HCL 300 MG XR24H-TAB (BUPROPION HCL) One tab by mouth in the morning - Signed Rx of BUPROPION HCL 300 MG XR24H-TAB (BUPROPION HCL) One tab by mouth in the morning;  #30 x 3;  Signed;  Entered by: Rodney Langton MD;  Authorized by: Rodney Langton MD;  Method used: Electronically to [facility] [address] [place], [place]  [id number], Ph: [phone], Fax: [phone]    Prescriptions: BUPROPION HCL 300 MG XR24H-TAB (BUPROPION HCL) One tab by mouth in the morning  #30 x 3   Entered and Authorized by:   Rodney Langton MD   Signed by:   Rodney Langton MD on 12/08/2009   Method used:   Electronically to        [facility] (retail)       [address]       [place], [place]  [id number]       Ph: [phone]       Fax: [phone]   RxID:   [id number]

## 2010-05-11 NOTE — Progress Notes (Signed)
Summary: Rx Ques  Phone Note Call from Patient Call back at [facility] Phone [phone]   Caller: Patient Summary of Call: Has questions about her thyroid medicine. Had appt today but could not come due to aide not showing up to help her get dressed her arm is broken in 3 places. Initial call taken by: Clydell Hakim,  September 24, 2009 1:49 PM  Follow-up for Phone Call        patient is concerned because she understood that her thyroid test was low and the doctor has sent her in a new Rx for thryoid med and it is a reduced dose. she is confused about this. explained that her lab test ,TSH  , was low which  indicated she had been on a llittle too much thryoid  replacement and this is why it was reduced. she voices understanding. Follow-up by: Theresia Lo RN,  September 24, 2009 4:11 PM  Additional Follow-up for Phone Call Additional follow up Details #1::        Thanks! Additional Follow-up by: Rodney Langton MD,  September 24, 2009 4:46 PM

## 2010-05-11 NOTE — Letter (Signed)
Summary: Generic Letter  [facility]  [address]   [place], [place] [address]   Phone: [phone]  Fax: [phone]    12/09/2009  Dominique Acosta [address] [place], [place]  [address]  Dear Ms. Kimoto,   Dr. Benjamin Stain would like for you to come in to have labs done before you go have your Echo done. Please call our office to schedule a lab apppointment to have this done.  Thank you.        Sincerely,   Loralee Pacas CMA

## 2010-05-13 NOTE — Consult Note (Signed)
Summary: [facility]   Imported By: De Nurse 04/21/2010 15:18:55  _____________________________________________________________________  External Attachment:    Type:   Image     Comment:   External Document

## 2010-06-22 LAB — URINALYSIS, ROUTINE W REFLEX MICROSCOPIC
Bilirubin Urine: NEGATIVE
Ketones, ur: NEGATIVE mg/dL
Nitrite: NEGATIVE
pH: 7 (ref 5.0–8.0)

## 2010-06-22 LAB — RAPID URINE DRUG SCREEN, HOSP PERFORMED
Benzodiazepines: POSITIVE — AB
Cocaine: NOT DETECTED
Tetrahydrocannabinol: NOT DETECTED

## 2010-06-22 LAB — COMPREHENSIVE METABOLIC PANEL
ALT: 27 U/L (ref 0–35)
AST: 31 U/L (ref 0–37)
Alkaline Phosphatase: 102 U/L (ref 39–117)
CO2: 29 mEq/L (ref 19–32)
Calcium: 8.8 mg/dL (ref 8.4–10.5)
Chloride: 100 mEq/L (ref 96–112)
GFR calc Af Amer: >60 mL/min (ref >60)
GFR calc non Af Amer: >60 mL/min (ref >60)
Potassium: 3.9 mEq/L (ref 3.5–5.1)
Sodium: 137 mEq/L (ref 135–145)
Total Bilirubin: 0.6 mg/dL (ref 0.3–1.2)

## 2010-06-22 LAB — DIFFERENTIAL
Basophils Absolute: 0 10*3/uL (ref 0.0–0.1)
Basophils Relative: 0 % (ref 0–1)
Basophils Relative: 0 % (ref 0–1)
Eosinophils Absolute: 0.2 10*3/uL (ref 0.0–0.7)
Eosinophils Relative: 1 % (ref 0–5)
Eosinophils Relative: 1 % (ref 0–5)
Lymphs Abs: 1.8 10*3/uL (ref 0.7–4.0)
Monocytes Absolute: 1.1 10*3/uL — ABNORMAL HIGH (ref 0.1–1.0)

## 2010-06-22 LAB — SALICYLATE LEVEL: Salicylate Lvl: <4 mg/dL (ref 2.8–20.0)

## 2010-06-22 LAB — CBC
HCT: 27.9 % — ABNORMAL LOW (ref 36.0–46.0)
Hemoglobin: 9.1 g/dL — ABNORMAL LOW (ref 12.0–15.0)
MCHC: 31.5 g/dL (ref 30.0–36.0)
MCV: 95.2 fL (ref 78.0–100.0)
RBC: 2.98 MIL/uL — ABNORMAL LOW (ref 3.87–5.11)
RDW: 15.3 % (ref 11.5–15.5)
WBC: 14.9 10*3/uL — ABNORMAL HIGH (ref 4.0–10.5)

## 2010-06-22 LAB — TRICYCLICS SCREEN, URINE: TCA Scrn: NOT DETECTED

## 2010-06-22 LAB — GLUCOSE, CAPILLARY

## 2010-06-22 LAB — ACETAMINOPHEN LEVEL: Acetaminophen (Tylenol), Serum: <10 ug/mL — ABNORMAL LOW (ref 10–30)

## 2010-06-28 ENCOUNTER — Ambulatory Visit: Payer: Self-pay | Admitting: Pharmacist

## 2010-08-13 ENCOUNTER — Ambulatory Visit: Payer: Self-pay | Admitting: Pharmacist

## 2010-08-27 NOTE — H&P (Signed)
[facility]  Patient:    Dominique Acosta, Dominique Acosta                          MRN: [id number] Adm. Date:  06/27/00 Attending:  Jasmine Pang, M.D.                   Psychiatric Admission Assessment  IDENTIFYING INFORMATION:  This is a 60 year old Caucasian female referred by [facility] on [facility] papers.  HISTORY OF PRESENT ILLNESS:  Patient presented to the emergency room in a very histrionic and distressed manner.  She was agitated and distraught and claimed she was "splitting into."  She was acting in a bizarre manner.  She was also delusional regarding medical problems.  She was having "seizures" but these were deemed to be pseudoseizures by the ER doctors.  They had access to her charts and she has been seen by Dr. Thad Ranger, neurologist.  Her thinking was tangential and she was not able to be calmed down safely enough to be sent home.  PAST PSYCHIATRIC HISTORY:  Patient is not seeing a psychiatrist right now. She was on an inpatient unit in [place].  PAST MEDICAL HISTORY:  Dr. Thad Ranger is her neurologist.  Medical problems include complains of "seizures."  She is said to be possible pseudoseizures by [facility] ER.  She also complains of "dystonia."  MEDICATIONS:  Neurontin 1200 mg p.o. t.i.d. (will determine whether she is indeed taking this amount), Vicodin 5/500 p.o. q.6h. p.r.n. pain, Restoril 30 mg p.o. q.h.s., Paxil 20 mg p.o. q.d., Klonopin 2 mg p.o. t.i.d., Cogentin 2 mg p.o. t.i.d., nitroglycerin 0.4 SL every 5 minutes x 3 p.r.n. chest pain.  DRUG ALLERGIES:  PENICILLIN and "ALL NEUROLEPTICS."  FAMILY HISTORY:  Brother has a history of substance abuse.  SUBSTANCE ABUSE HISTORY:  Patient denies.  SOCIAL HISTORY:  Patient has recently moved from [place] several months ago.  She lives with her parents in [place].  She describes a lot of conflict with them.  She has been unable to work due to her medical and psychiatric problem.  There  are no legal problems.  ADMISSION MENTAL STATUS EXAMINATION:  Patient presented as an agitated, flailing, disheveled Caucasian female dressed in pajamas.  She was having "seizures" as we talked.  She was also having "dystonia."  However, she was talking with me throughout all of the episodes including the seizures.  There was no postictal confusion and she never lost consciousness or control of her bowels or bladder.  Speech was soft and slow.  She spoke at times with a clenched mouth, which she said was related to her dystonia.  Mood was angry, depressed.  Affect consistent with mood.  There was positive suicidal ideation.  She stated she wanted to die right away.  There was no homicidal ideation.  There was positive psychosis as indicated in HPI regarding her medical problems.  Thought processes revealed flight of ideas, tangentiality, circumstantiality and bizarre ideation.  Thought content revolved predominantly around her having a seizure disorder and dystonia.  Cognitive exam revealed decreased short-term and long-term memory, decreased attention and concentration with distractibility and judgment and insight were poor.  ADMISSION DIAGNOSES: Axis I:    Psychotic disorder not otherwise specified. Axis II:   Deferred. Axis III:  Pseudoseizures. Axis IV:   Severe. Axis V:    Current Global Assessment of Functioning 10; highest past year 45.  STRENGTHS AND ASSETS:  Patient has a  supportive family.  PROBLEMS:  Delusional ideation regarding medical problems with suicidal ideation.  SHORT-TERM TREATMENT GOAL:  Resolution of suicidal ideation and improvement in ability to function (i.e. decrease in constant pseudoseizures).  LONG-TERM TREATMENT GOAL:  Improvement in mood and psychosis to the point that she is able to manage in a more meaningful manner.  PLAN:  Continue medications.  Start an antipsychotic.  Will place on constant observations at this point for her  safety.  ANTICIPATED LENGTH OF STAY:  Five to seven days.  CONDITION NECESSARY FOR DISCHARGE:  Not psychotic.  Not suicidal.  POST-HOSPITAL CARE PLANS:  Return home to live with family.  Follow-up therapy and medication management will be at the [facility]. D:  06/29/00 TD:  07/01/00 Job: [id number]

## 2010-08-27 NOTE — Op Note (Signed)
   NAME:  Dominique Acosta, Dominique Acosta                           ACCOUNT NO.:  [id number]   MEDICAL RECORD NO.:  [id number]                   PATIENT TYPE:  OIB   LOCATION:  2899                                 FACILITY:  [facility]   PHYSICIAN:  Sable Feil., D.D.S.        DATE OF BIRTH:  05-Sep-1950   DATE OF PROCEDURE:  09/17/2002  DATE OF DISCHARGE:                                 OPERATIVE REPORT   PREOPERATIVE DIAGNOSIS:  Multiple carious necrotic and nonrestorable teeth.   POSTOPERATIVE DIAGNOSIS:  Multiple carious necrotic and nonrestorable teeth.   PROCEDURE:  Extraction of multiple teeth.   INDICATIONS FOR PROCEDURE:  [name] has multiple medical problems and is  unable to tolerate this surgery in an office setting, therefore, general  anesthesia is indicated.   DESCRIPTION OF PROCEDURE:  The patient was brought to the operating room and  placed on the operating table in the supine position. Following successful  induction of general anesthesia via oral endotracheal intubation, the  patient was prepped and draped in the usual sterile fashion for a procedure  of this type. Initially an oropharyngeal throat pack was placed which was  removed prior to the conclusion of the case. Next the oral cavity was  thoroughly irrigated with normal saline and suctioned dry. Following this a  2% Lidocaine solution with 1:100,000 epinephrine was injected into the  maxillary and mandibular tissues; approximately 6 cc of local anesthetic was  injected in total.   After allowing adequate time for anesthesia and hemostasis, teeth numbers 3,  4, 6, 7, 8, 14, 23, 24, 25, 26, 28 and 29 were surgically extracted. The  operative sites were then debrided. Several 4-0 chromic  gut sutures were  then placed  to reapproximate the gingival tissues. The oral cavity was  again irrigated and the throat pack was removed. This completed the case.  All sponge, instrument and needle counts were correct at the  conclusion of  the case.                                               Sable Feil., D.D.S.    JWB/MEDQ  D:  09/17/2002  T:  09/17/2002  Job:  [id number]

## 2010-08-27 NOTE — Op Note (Signed)
NAME:  Dominique Acosta, GRILLI NO.:  [id number]   MEDICAL RECORD NO.:  [id number]                   PATIENT TYPE:  OIB   LOCATION:  2550                                 FACILITY:  [facility]   PHYSICIAN:  Sable Feil., D.D.S.        DATE OF BIRTH:  1950-09-01   DATE OF PROCEDURE:  07/03/2003  DATE OF DISCHARGE:  07/03/2003                                 OPERATIVE REPORT   PREOPERATIVE DIAGNOSIS:  Advanced periodontal disease and dental caries.   POSTOPERATIVE DIAGNOSIS:  Advanced periodontal disease and dental caries.   PROCEDURE:  Surgical extraction of teeth numbers 5, 11, 12, 15, 27, and 30,  and four quadrants of alveoloplasty.   ANESTHESIA:  General via nasal endotracheal intubation.   PROCEDURE IN DETAIL:  The patient was brought to the operating room in  satisfactory preoperative condition and placed on the operating table in  supine position.  Following successful induction of general anesthesia via  nasal endotracheal intubation, the patient was prepped and draped in the  usual fashion for a procedure of this type.  Initially, an oropharyngeal  throat pack was placed which was removed prior to the conclusion of the  case.  Next, the oral cavity was irrigated with normal saline and suctioned  dry.  The patient was given a dose of intravenous Clindamycin 600 mg  preoperatively.  Next, the maxillary and mandibular soft tissues were then  infiltrated with approximately 6 mL of a 2% lidocaine solution with  1:100,000 epinephrine.  After allowing adequate time for local anesthesia  and hemostasis, the remaining teeth were surgically extracted.  These  included teeth numbers 5, 11, 12, 15, 27, and 30.  Following this, a 15  blade was utilized to create crest incisions along the maxilla and mandible.  Full thickness mucoperiosteal flaps were then elevated and alveoloplasty was  carried out with rongeurs and a bone file.  After establishing level,  smooth  bone on the alveolus of the maxilla and mandible, the operative sites were  then irrigated and closed with multiple 4-0 chromic gut sutures.  This  completed the procedure.  The throat pack was removed and the patient was  allowed to recover from general anesthesia and was transported to the post  anesthesia care unit in satisfactory postoperative condition.  All sponge,  needle, and instrument counts were correct at the conclusion of the case.                                               Sable Feil., D.D.S.    JWB/MEDQ  D:  07/03/2003  T:  07/03/2003  Job:  [id number]

## 2010-08-27 NOTE — H&P (Signed)
[facility]  Patient:    Dominique Acosta, Dominique Acosta Visit Number: [id number] MRN: [id number]          Service Type: PSY Location: 400 [id number] 01 Attending Physician:  Jeanice Lim Dictated by:   Candi Leash. Orsini, N.P. Admit Date:  08/14/2001                     Psychiatric Admission Assessment  IDENTIFYING INFORMATION:  A 60 year old single white female who was involuntarily committed on Aug 14, 2001, for psychosis and questionable overdose.  HISTORY OF PRESENT ILLNESS:  The patient presents with a history of schizoaffective disorder, found verbally nonresponsive after an argument with her father.  There was a questionable overdose as patients pupils were very dilated on admission to the emergency room.  Her tox screen was nonspecific. The patient was monitored at [facility] for a 4-day hospitalization, then transferred to [facility] for further evaluation of patients psychosis.  The patient has been increasingly paranoid for the past few days prior to this admission, and some depressive symptoms.  The patient denies any suicidal or homicidal ideation or psychosis, reports her sleep and appetite has been satisfactory.  The patient has been pacing a lot, has a history of tardive dyskinesia, states she does not want to be touched during the interview, and has a history of falls.  PAST PSYCHIATRIC HISTORY:  Sees Dr. Isaac Bliss in [facility].  Has multiple psychiatric admissions to [facility] and [facility].  SOCIAL HISTORY:  She is a 60 year old single white female with no children. She lives alone.  She is disabled.  FAMILY HISTORY:  Unknown.  ALCOHOL DRUG HISTORY:  The patient smokes, denies any alcohol or substance abuse.  PAST MEDICAL HISTORY:  Primary care Amadeus Oyama is unknown.  Medical problems are anemia, epilepsy and reported coronary artery disease with a history of MI.  MEDICATIONS:  Klonopin 2 mg t.i.d., Cogentin 2  mg t.i.d., Neurontin 1200 mg b.i.d., trazodone 25 mg q.h.s.  DRUG ALLERGIES:  PENICILLIN.  PHYSICAL EXAMINATION:  Performed the patient was hospitalized at [facility]. Her acetaminophen level was less than 2.8.  Urine drug screen was positive for benzodiazepines.  Alcohol level was less than 5.  Urinalysis showed positive ketones and 30+ protein.  CBC within normal limits, CMET within normal limits.  MENTAL STATUS EXAMINATION:  She is an alert, middle-aged, unkempt Caucasian female, staggering, unsteady on her feet, talking to the air, appears to be responding to internal stimuli.  Good eye contact, restless.  Speech is normal, some relevant information obtained.  Mood is depressed, affect is somewhat labile.  Thought processes are positive paranoia and questionable auditory hallucinations, appears to be responding to internal stimuli.  No visual hallucinations, no flight of ideas, denies any suicidal ideation. Cognitive function intact.  Memory is fair, impaired insight and judgment.  ADMISSION DIAGNOSES: Axis I:    Schizoaffective disorder, depressive and psychotic features. Axis II:   Deferred. Axis III:  Questionable status post overdose, possible tardive dyskinesia. Axis IV:   Problems with primary support group and other psychosocial problems Axis V:    Current 25, estimated this past year 55-60.  INITIAL PLAN OF CARE:  Involuntary commitment for psychosis, status post overdose.  Contract for safety, check every 15 minutes.  The patient will be placed on the 400 hall for close monitoring and will require 1 to 1 for falls and redirection.  We will resume her routine medications, check her labs,  have case worker to look at living arrangements after discharge.  Patient to be medication compliant.  Treatment plans were discussed with Dr. Kathrynn Running.  We will d/c her Geodon and replace with Zydis Zyprexa for psychosis.  TENTATIVE LENGTH OF STAY:  4 to 6 days or more depending on  patient response to medication. Dictated by:   Candi Leash. Orsini, N.P. Attending Physician:  Jeanice Lim DD:  08/17/01 TD:  08/17/01 Job: [id number]

## 2010-08-27 NOTE — Discharge Summary (Signed)
[facility]  Patient:    Dominique Acosta, Dominique Acosta Visit Number: [id number] MRN: [id number]          Service Type: OBV Location: 4700 4712 01 Attending Physician:  Leilani Able D. Dictated by:   Netta Cedars, M.D. Admit Date:  12/18/2000 Discharge Date: 12/19/2000                             Discharge Summary  HISTORY OF PRESENT ILLNESS:  The patient is a 60 year old single female who was admitted on involuntary commitment.  She was found in the emergency room being disorganized and unable to attend her ADLs.  Mood and affect were extremely labile.  The patient was afraid somebody was going to kill her and maybe she can kill somebody in self-defense.  The patient felt like she is losing her mind.  She felt agitated and like she does not have control of her own body.  Prior to admission, she was treated with Neurontin, Paxil, and Klonopin.  The patient is a client of [facility] with questionable compliance in the past.  Historically, she suffers with some form of schizophrenia.  Medically, she has history of UTI and seizure disorder. 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  CURRENT MEDICATIONS:  Most recent medications were Paxil, Neurontin, and Klonopin.  INITIAL DIAGNOSTIC IMPRESSION: Axis I:     Schizophrenia, disorganized type. Axis V:     Recent Global Assessment of Functioning upon admission 30.  HOSPITAL COURSE:  After admitting to the ward, the patient was placed on special observation.  She was very delusional and suspicious of her family and extremely hard to convince to take her medications.  The patient had pretty severe abnormal movement which could be related to previous use of neuroleptics.  The patient was convinced that because of her dyskinesia she should not receive anymore neuroleptics.  After persuasion, she agreed to take Seroquel which was gradually increased to 25 mg at noon and 50 mg at night. She  was also intermittently agitated at one point and required safety restraints.  Throughout hospitalization the patient had mania-like presentation which however, gradually improved and the patient gained some considerable insight into her condition.  On November 21, 2000, she was still somehow delusional but less irritable and agreeable for arrangements for discharge.  She was reluctant to agree to contact her family but finally she agreed to contact her father and this way discharge planning could materialize.  Review of blood work and additional labs showed normal urinalysis and normal thyroid panel.  Chemistry-8 showed normal electrolytes, BUN and creatinine, calcium and glucose.  CBC was normal.  Urine drug screen was positive for methadone, benzodiazepines, and tricyclics.  The patient denied taking medication which would effect her urine drug screen in this way.  DISCHARGE RECOMMENDATIONS:  Neurontin 600 mg three times a day, vitamin E 400 three times a day, colchicine 0.5 mg three times a day, Seroquel 25 mg at noon and 50 at bedtime, Klonopin 1 mg four times a day, nitroglycerin 0.5 mg sublingually p.r.n.  The patient should call her family doctor and follow up with him because of heart problems and low blood pressure.  The patient should come to ER if recurrence of symptoms or problems with medications.  She has appointment in [facility] on November 22, 2000 at 8:30 p.m. in emergency services.  The patient understood instructions, in good condition was discharged home in care  of her father. Dictated by:   Netta Cedars, M.D. Attending Physician:  Leilani Able D. DD:  01/02/01 TD:  01/03/01 Job: [id number]

## 2010-08-27 NOTE — Consult Note (Signed)
[facility]  Patient:    Dominique Acosta, Dominique Acosta Visit Number: [id number] MRN: [id number]          Service Type: EMS Location: ED Attending Physician:  Shelba Flake Dictated by:   Katy Fitch Naaman Plummer., M.D. Proc. Date: 08/17/01 Consultation Date: 08/17/01 Admit Date:  08/17/2001   CC:         Please send Dr. Budd Palmer copy to his [facility] box rather than [facility] box                          Consultation Report  CHIEF COMPLAINT:  Pain right wrist, status post fall.  HISTORY OF PRESENT ILLNESS:  Dominique Acosta is a 60 year old woman currently residing at the [facility].  She was involved in a dance exercise this evening and fell onto her outstretched right hand sustaining an acute injury to her right wrist region. She was noted to have bruising and deformity of the wrist and was transported by [facility] to the [facility] emergency room.  There she was evaluated by Dr. Earlyne Iba who ordered x-rays of the left wrist.  Dr. Margretta Ditty did a primary survey and identified no other significant injuries.  Dr. Margretta Ditty interpreted the x-rays of her wrist reveal a displaced dorsally angulated impacted fracture of the distal radius and ulnar styloid fracture. Dr. Margretta Ditty called a hand surgery consult.  Prior to my arrival Dr. Margretta Ditty had administered some pain medication.  On initial interview, Ms. Bauer is a 60 year old woman who was mildly sedated. She was noted to have obvious deformity of her right radius.  She was accompanied by an attendant and a nursing tech from the [facility].  Photocopy records of her chart were transported from the [facility] which indicated that she had allergies to PENICILLIN and PHENYTOIN (DILANTIN).  Her medications were listed.  I contacted her charge nurse to confirm her medical records ([facility] at [facility]) who confirmed that her chart  was accurate.  It was also documented that pain medication would be available for her at [facility].  I also had the [facility] folks contact Ms. Morgans father, Hazell Siwik, at home in [place] and we explained her predicament to him.  We obtained informed consent - both from her nursing supervisor and from the conversation with her father - to proceed with treatment of her wrist injury.  After discussion with Ms. Muhlenkamp of the predicament and clinical examination of her upper extremities which revealed intact pulses, normal sensibility, and ability to move her fingers and thumb bilaterally, we proceeded to recommend a closed reduction and application of sugar tong splint.  PROCEDURE:  After questions were answered, Ms. Whittinghill had Betadine prep of the dorsal aspect of her wrist followed by irrigation of 2% Lidocaine, a total of 4 cc into the fracture hematoma.  After approximately five minutes she was placed in fingertraps on the index, long, and ring fingers with 6 pounds of countertraction.  After five minutes of traction a closed reduction was performed, reducing the fracture to a clinically anatomic position.  A sugar tong splint was applied with the Webril directly on the skin and 3 inch plaster slabs and Ace wrap.  No Kling or other unyielding dressing was applied.  Post reduction she had normal capillary refill in her fingers.  DISPOSITION:  After the splint was three-point molded, arrangements were made for her  to be transferred back to the [facility].  We advised the charge nurse that we would write orders for pain medicine in the form of Percocet 5 mg one or two p.o. q.4-6h. p.r.n. pain to be given with nursing discretion after following her vital signs.  I advised her father of her injury, our treatment plan, and the need to see her in follow-up on Aug 22, 2001 at the office.  I further informed her father that we may need  to consider closed or open reduction with internal fixation and/or pin fixation should her fracture behave in an unstable manner.  Questions were invited and answered. Dictated by:   Katy Fitch Naaman Plummer., M.D. Attending Physician:  Shelba Flake DD:  08/17/01 TD:  08/18/01 Job: [id number]

## 2010-08-27 NOTE — H&P (Signed)
[facility]  Patient:    Dominique Acosta, Dominique Acosta Visit Number: [id number] MRN: [id number]          Service Type: OBV Location: 4700 4712 01 Attending Physician:  Leilani Able D. Dictated by:   Young Berry Scott, N.P. Admit Date:  12/18/2000 Discharge Date: 12/19/2000                     Psychiatric Admission Assessment  DATE OF ADMISSION:  November 10, 2000  PATIENT IDENTIFICATION:  This is a 60 year old female who is single.  She is an involuntary commitment.  HISTORY OF PRESENT ILLNESS:  This patient with a history of schizophrenia called EMS for chest pain and they found the patient with disorganized thinking and a messy household.  The patient appeared to have not been attending to her activities of daily living or able to maintain the house in any type of order.  Today her mood is extremely labile.  She fears someone is trying to kill her at home through her natural gas system at home.  She is agitated and crying one minute and calm the next.  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  She denies homicidal ideation or suicidal ideation at this time.  She denies any auditory or visual hallucinations.  PAST PSYCHIATRIC HISTORY:  The patient is followed at [facility] although it is not clear if she is maintaining current appointments.  She does have a history of one prior inpatient admission to [facility] in March 2002 and she states she has a history of prior admissions in [place] when she resided there.  SUBSTANCE ABUSE HISTORY:  The patient denies any use of alcohol or illegal drugs.  PAST MEDICAL HISTORY:  The patients primary care Gleb Mcguire is unclear at this time.  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  The patient is unable to describe the pharmacy that she uses. 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  MEDICATIONS:  Unclear at this time and it is unclear if she is compliant with her medications.  Based on the nursing assessment and what she is able to tell me today, it appears that she takes: 1. Neurontin 1200 mg p.o. t.i.d.; it is unclear is that is for her nerves or    for seizures. 2. Nitroglycerin p.r.n. for chest pain. 3. Klonopin 1 to 2 mg b.i.d. 4. Benadryl dose unknown. 5. Paxil 20 mg q.d. 6. Cogentin at times.  DRUG ALLERGIES:  INDERAL causes and asthma-like reaction.  PHYSICAL EXAMINATION:  GENERAL:  The patient was seen in the emergency room at [facility] and was medically cleared with a negative physical examination.  LABORATORY DATA:  Urine drug screen was positive for benzodiazepines and methadone.  Urinalysis was positive for many epithelials.  She denies any pain or dysuria at this time. SOCIAL HISTORY:  The patient moved from [place] and her previous history states that she has been living with her parents here.  She describes conflict with them now, having to live in "a starched household" where she is made to feel that she is somehow inadequate.  She feels that Gaylyn Lambert, her sister, is the more favored daughter and for this reason she does not  get along well or feel positive toward her relationship with her parents that are currently in the home.  She is currently not permitting [place] today to call her family or obtain any additional history.  FAMILY HISTORY:  Positive for brother with a history of substance abuse.  MENTAL STATUS EXAMINATION:  This is a disheveled Caucasian female in hospital gown with considerable dystonic movements.  Affect is labile and anxious. Speech is rambling and tangential but shows no pressure.  Mood is guarded and anxious.  She  is quite restless and agitated both physically and has psychomotor agitation also.  Thought process is disorganized with evidence of paranoia and flight of ideas.  Cognitive: Intact.  ADMISSION DIAGNOSES: Axis I:    Rule out schizophrenia, disorganized type. Axis II:   Deferred. Axis III:  1. Rule out urinary tract infection.            2. Seizure disorder, not otherwise specified. Axis IV:   Moderate problems related to the primary support group and conflict            within her family. Axis V:    Current 30, past year 78.  INITIAL PLAN OF CARE:  Plan is to admit the patient to stabilize her mood. Q.84m. checks are in place.  We will check her medications with [facility] as soon as possible.  She is not able to tell us her pharmacy at this time or how to locate her physician, Dr. Thad Ranger.  We will repeat her urine in the morning, clean catch for culture and sensitivity and urinalysis.  We will attempt to get information from her family as soon as she will allow [place] to do so.  Meanwhile, we will restart her current medications.  ESTIMATED LENGTH OF STAY: Three to six days. Dictated by:   Young Berry Scott, N.P. Attending Physician:  Leilani Able D. DD:  01/03/01 TD:  01/03/01 Job: [id number]

## 2010-08-27 NOTE — Discharge Summary (Signed)
[facility]  Patient:    Dominique Acosta, Dominique Acosta Visit Number: [id number] MRN: [id number]          Service Type: PSY Location: 400 0402 01 Attending Physician:  Jeanice Lim Dictated by:   Jeanice Lim, M.D. Admit Date:  08/14/2001 Discharge Date: 08/30/2001                             Discharge Summary  IDENTIFYING DATA:  This is a 60 year old single Caucasian female involuntarily committed for psychosis and questionable overdose.  MEDICATIONS:  Klonopin 2 mg t.i.d., Cogentin 2 mg t.i.d., Neurontin 1200 mg b.i.d. and trazodone 25 mg q.h.s.  ALLERGIES:  PENICILLIN.  PHYSICAL EXAMINATION:  Performed at [facility] and essentially within normal limits.  Neurologically nonfocal.  LABORATORY DATA:  Urine drug screen positive for benzodiazepines.  Alcohol level less than 5.  Urinalysis showed positive protein.  CBC and CMET were within normal limits.  MENTAL STATUS EXAMINATION:  Alert, middle-aged, unkempt, Caucasian female, staggering, unsteady on her feet, talking to the air.  Responding to internal stimuli, was somewhat restless.  Speech was mostly relevant.  Mood depressed. Affect somewhat labile.  Thought processes positive for paranoia and possible auditory hallucinations, appearing respondent to internal stimulus.  The patient denied suicidal, homicidal or violent ideation and cognitively intact. Judgment and insight impaired.  ADMISSION DIAGNOSES: Axis I:    Schizoaffective disorder, depressed without psychotic features. Axis II:   None. Axis III:  1. Possible status post overdose.            2. Possible tardive dyskinesia. Axis IV:   Moderate (problems with primary support group). Axis V:    25/55.  HOSPITAL COURSE:  The patient was admitted and ordered routine p.r.n. medications.  Restarted on Ativan, Geodon, Cogentin, Klonopin and Geodon was optimized.  Zyprexa Zydis was added to control psychotic symptoms and mood lability and  Klonopin optimized.  The patient was difficult to redirect at times and would get up and walk unsteadily due to movement disorder with a long history of unstable gait and multiple past falls.  The patient fractured arm and this was placed in a sling as well as patient was put on increased fall precautions as well as a Posey at times to decrease risk of fall until patient was further stabilized.  The patient would pace at times and, as she was stabilized on medication, pacing, abnormal movements and unsteadiness all improved gradually after extended one-to-one, which was required.  The patient reported feeling depressed, was ambivalent about medications but gradually became more cooperative, was clearly responding to internal stimulus, mumbling and talking to people that were not there.  This behavior decreased as patient was further stabilized on medications.  Klonopin was further optimized for akathisia and abnormal movements as well as adjusted on Seroquel.  She described chronic auditory and visual hallucinations, having conversations with herself, aware that the thoughts were coming from inside her head and she refused most antipsychotics, which were ordered due to belief that these were bad for her and would worsen her TD.  The risk/benefit ratio and alternative treatments regarding medications were discussed and Neurontin and Klonopin were optimized to decrease risk of worsening movements.  The patient continued to require episodic one-to-one.  Family was involved in treatment.  Father felt the patient should be further stabilized.  The patient would use wheelchair to decrease risk of fall but had to be redirected to  do so.  The patient exhibited combativeness towards staff, continued to exhibit psychotic symptoms, indicating a need for longer term inpatient treatment and stabilization on medications.  The patient was petitioned and considered for transfer to [facility] for  continued stabilization.  The patient had orthopedic follow-up as needed and was discharged to continue the same medications for further evaluation and stabilization.  DISCHARGE DIAGNOSES: Axis I:    Schizoaffective disorder, depressed without psychotic features. Axis II:   None. Axis III:  1. Possible status post overdose.            2. Possible tardive dyskinesia. Axis IV:   Moderate (problems with primary support group). Axis V:    Global Assessment of Functioning on discharge 40. Dictated by:   Jeanice Lim, M.D. Attending Physician:  Jeanice Lim DD:  10/04/01 TD:  10/05/01 Job: [id number]

## 2010-08-27 NOTE — Op Note (Signed)
NAMEMERAL, GEISSINGER                 ACCOUNT NO.:  [id number]   MEDICAL RECORD NO.:  [id number]          PATIENT TYPE:  AMB   LOCATION:  [facility]                         FACILITY:  [facility]   PHYSICIAN:  Claudette Laws, M.D.  DATE OF BIRTH:  22-Apr-1950   DATE OF PROCEDURE:  03/09/2006  DATE OF DISCHARGE:                               OPERATIVE REPORT   CHIEF COMPLAINT:  History of urethral stricture/urethral syndrome.   POSTOPERATIVE DIAGNOSIS:  History of urethral stricture/urethral  syndrome.   OPERATIONS:  Cystoscopy and urethral dilation to #28-French.   SURGEON:  Claudette Laws, M.D.   HISTORY:  This is a 60 year old lady with a history of urethral  stricture which periodically requires dilation under anesthesia.  She  has had a lot of irritative voiding symptoms recently.  She has been on  Urelle.  In the office, however, she refuses any type of cystoscopic  exam or a catheterization procedure.  She requested a dilation under  anesthesia.   PROCEDURE:  The patient was prepped and draped in the dorsal lithotomy  position under LMA anesthesia.  Exam of the external genitalia reveals a  normal meatus, fairly good anterior support, no obvious pelvic masses.  Initially I passed a 18-French female sound without difficulty.  There  was some depression to the urethra but we did dilate her up to a 28-  [place].  We sent the urine for UA the culture.  Cystoscopy with both at  12 degrees and 70 degrees lens revealed some mild hyperemia throughout  the bladder but also +1 to  +2 trabeculation but no obvious tumors, no  calculi, normal ureteral orifices.  Appropriate pictures were taken.   At the end of the procedure I injected 10 mL of Xylocaine jelly.  She  was then taken back to PACU in satisfactory condition.      Claudette Laws, M.D.  Electronically Signed     RFS/MEDQ  D:  03/09/2006  T:  03/09/2006  Job:  [id number]

## 2010-08-27 NOTE — Discharge Summary (Signed)
[facility]  Patient:    Dominique Acosta, Dominique Acosta                          MRN: [id number] Adm. Date:  [id number] Disc. Date: [id number] Attending:  Milford Cage H                           Discharge Summary  REASON FOR ADMISSION:  The patient is a 60 year old Caucasian female who was referred by [facility] on [facility] papers.  She has a history of pseudoseizures.  She presented to the emergency room acting in a bizarre and delusional manner with tangential thinking.  She was very agitated and distraught.  She claims she was "splitting in two."  She is currently not seeing a psychiatrist in [place].  She had recently moved here from [place].  She does see Dr. Thad Ranger, a neurologist, who is treating her with Neurontin 1200 mg p.o. t.i.d. for "seizures."  For further assessment information, see Psychiatric Admission Assessment.  PHYSICAL EXAMINATION:  This was done at the [facility] ED; results were within normal limits.  She was reported to be having pseudoseizures.  The report is in the [facility] ED chart.  LABORATORY DATA:  Basic metabolic panel was within normal limits, the lipase was within normal limits, and the rest of the labs were done at [facility] Emergency Room and were read by their ER physicians.  There was nothing of concern.  A urine drug screen was also negative.  Urinalysis revealed a large amount of leukocytes indicating a possible urinary tract infection.  I will prescribe Septra DS b.i.d. x 7 days for this.  HOSPITAL COURSE:  Upon admission, the patient was extremely agitated.  She continued to display seizure-like activity consistent with the pseudoseizures that had been seen in the emergency room.  She was restarted on her medications, though Neurontin was decreased initially to 600 mg p.o. t.i.d. She was also restarted on Klonopin 2 mg t.i.d., Vicodin 5/500 one to two p.o. q.6h. p.r.n. pain, Paxil 20 mg q.d., Cogentin 2 mg p.o.  t.i.d., nitroglycerin 0.4 sublingual q. 5 minutes x 3 p.r.n. chest pain, Restoril 30 mg p.o. q.h.s. On June 28, 2000, she became extremely agitated and was having nonstop pseudoseizures.  She was, however, able to talk with me throughout the seizures.  She also was having episodes of what she called dystonia where her extremity muscles would clinch.  She was given Ativan 2 mg IM stat. Eventually, she calmed down, but was placed on a constant observation level in order to avoid her harming herself with her flailing around.  The Neurontin was increased to 900 mg p.o. t.i.d.  A p.r.n. dose of Ativan was started at 2 mg p.o. q.4h. p.r.n. agitation.  On June 30, 2000, she had to be sent to the [facility] ED due to severe stomach spasms and epigastric distress.  She refused to go to the [facility] ED.  She was found to have a gastritis and treated with Phenergan.  Upon return, she was able to sleep probably from the Phenergan dose.  On July 02, 2000, she was started on Protonix 40 mg q.d. for her gastritis/GERD symptoms.  There were no significant side effects to her medications.  The nausea and vomiting did not appear to be related to the meds, but were from gastritis.  As the hospitalization progressed, the patients mood and affect  improved. She was initially very irritable, hostile, and angry.  She was blaming of staff and acting in a paranoid manner.  This improved, and by the end of her hospitalization, she was conversing in a polite engaging manner.  She was no longer angry or irritable and, in fact, expressed the fact that the was sorry for having been difficult upon admission.  She received support from her parents with whom she lives currently, and they made plans to pick her up when she was ready for discharge.  At the time of discharge, she was no longer having pseudoseizures and no longer having her "dystonic episodes."  I attempted to start antipsychotics; however, the patient  refused adamantly to take these.  She stated she was allergic to all NEUROLEPTICS.  At one point, I thought we may attempt to force meds, but the need for this resolved, and she did not meet criteria.  DISCHARGE MENTAL STATUS EXAMINATION:  The patient was much less agitated.  Eye contact was very good.  She was friendly and polite.  She was well dressed and displayed good hygiene.  Her speech was normal rate and flow.  Her mood was less depressed and not angry at all.  Affect wide range and able to smile appropriately and joke.  There was no suicidal or homicidal ideation, no self-injurious behavior, or aggression.  Psychosis was positive for delusions regarding her medical condition, but she was less insistent and agitated about this.  Thought processes still revealed some flight of ideas, but they were more logical and goal directed.  Cognitive exam was back to baseline.  Her judgment was good, insight fair.  DISCHARGE DIAGNOSES: Axis I:    Psychotic disorder, not otherwise specified. Axis II:   Personality disorder, not otherwise specified. Axis III:  1. Pseudoseizures.            2. Gastroesophageal reflux disease/gastritis.            3. Possible urinary tract infection currently being treated with               Septra DS.            4. Occasional back pain. Axis IV:   Severe. Axis V:    Global Assessment of Functioning current is 50; highest past year            is 60; admission status was 10.  DISCHARGE MEDICATIONS: 1. Neurontin 1200 mg p.o. t.i.d. as prescribed by her neurologist,    Dr. Thad Ranger. 2. Klonopin 2 mg p.o. t.i.d. 3. Vicodin 5/500 one to two pills q.4h. p.r.n. pain as prescribed by    Dr. Thad Ranger. 4. Paxil 20 mg q.d. 5. Cogentin 2 mg t.i.d. 6. Nitroglycerin 0.4 mg SL q. 5 minutes x 3 for chest pain. 7. Restoril 30 mg h.s. p.r.n. insomnia. 8. Septra DS one p.o. b.i.d. x 7 days. 9. Protonix 40 mg q.d.  DISCHARGE INSTRUCTIONS:  Activity level:  No  restrictions.  Diet:  No restrictions.  POSTHOSPITAL CARE PLANS:  Return home to live with her family.  DISCHARGE FOLLOWUP:  Follow-up therapy and medication management will be at  the [facility]. DD:  07/03/00 TD:  07/04/00 Job: [id number]

## 2010-08-27 NOTE — Discharge Summary (Signed)
[name], [name]                             ACCOUNT NO.:  [id number]   MEDICAL RECORD NO.:  [id number]                   PATIENT TYPE:  INP   LOCATION:  0457                                 FACILITY:  [facility]   PHYSICIAN:  Corinna L. Lendell Caprice, MD             DATE OF BIRTH:  07/25/1950   DATE OF ADMISSION:  05/14/2003  DATE OF DISCHARGE:  05/17/2003                                 DISCHARGE SUMMARY   TRANSFER SUMMARY:   DIAGNOSES:  1. Bibasilar pneumonia.  2. Tobacco abuse.  3. Prerenal azotemia.  4. Dehydration.  5. Acute psychosis.  6. Schizophrenia.   DISCHARGE MEDICATIONS:  1. Avelox 400 mg p.o. daily x 4 more days.  2. Klonopin 2 mg p.o. t.i.d.  3. Cogentin 2 mg p.o. t.i.d.  4. Zocor 40 mg p.o. daily.  5. Synthroid 75 mcg p.o. daily.  6. Trazodone 150 mg p.o. q.h.s.  7. Neurontin 300 mg p.o. t.i.d.  8. Effexor XR 112 mg p.o. daily.  9. Atarax 25 mg p.o. t.i.d.  10.      Seroquel 100 mg p.o. t.i.d.  11.      Ativan 1-2 mg p.o. IM or IV q.6h. p.r.n.   CONDITION ON DISCHARGE:  Stable.   ACTIVITY:  She was encouraged to stop smoking.   DIET:  Low cholesterol.   CONSULTATIONS:  Dr. Jeanie Sewer.   LABORATORY DATA:  Initial white blood cell count was 20,000.  It decreased  to 10,000.  The rest of her CBC was essentially unremarkable.  Her complete  metabolic panel with unremarkable lipase 21.  Urine drug screen negative.  CPK 652.  UA negative.  Chest x-ray on admission was a poor film but  possibly bilateral infiltrates versus atelectasis.  Repeat chest x-ray the  day after showed the continued infiltrates versus atelectasis.   HISTORY AND HOSPITAL COURSE:  Dominique Acosta is a 60 year old white female,  smoker, with schizophrenia who was brought to the emergency room as she had  been yelling for 24 hours.  She was acutely psychotic.  On laboratory  evaluation in the emergency room, she was noted to have a leukocytosis.  She  had normal oxygen saturations.  She was  tachycardic, normal respiratory rate  and other than the psychosis, had a fairly unremarkable examination.  Given  the leukocytosis and possible pneumonia on chest x-ray, she was admitted to  the medical floor, given IV antibiotics, and monitored.  Dr. Jeanie Sewer was  consulted and agreed that patient was acutely psychotic and recommended  admission to [facility] when medically cleared.  At the time of  discharge, she was slightly less agitated, but she remained hostile and  argumentative, accusational, and she was also noted to be talking to the  walls, according to the nurse.  She was very concerned about her oxygen  levels, but her oxygen saturations remained normal, and she insisted upon  going  out to smoke.  She also complained of right leg pain but refused  Doppler to rule out DVT.  She had no physical findings of DVT.  She also had  no suggestion of fracture on examination and was noted to be walking without  any difficulty, essentially constantly throughout the day, so I feel no x-  ray is necessary.  She should follow up with her primary care physician in  four weeks and will need a repeat chest x-ray in 4-6 weeks.                                               Corinna L. Lendell Caprice, MD    CLS/MEDQ  D:  05/17/2003  T:  05/17/2003  Job:  [id number]

## 2010-08-27 NOTE — Op Note (Signed)
   NAME:  Dominique Acosta, Dominique Acosta                           ACCOUNT NO.:  [id number]   MEDICAL RECORD NO.:  [id number]                   PATIENT TYPE:  OIB   LOCATION:  2866                                 FACILITY:  [facility]   PHYSICIAN:  Sable Feil., D.D.S.        DATE OF BIRTH:  [birth year]   DATE OF PROCEDURE:  11/01/2002  DATE OF DISCHARGE:  11/01/2002                                 OPERATIVE REPORT   PREOPERATIVE DIAGNOSES:  1. Dental caries and periodontal disease.  2. Schizophrenia.   POSTOPERATIVE DIAGNOSES:  1. Dental caries and periodontal disease.  2. Schizophrenia.   DESCRIPTION OF PROCEDURE:  The patient was brought to the operating room in  satisfactory preoperative condition and placed on the operating table in the  supine position. Following successful induction of general anesthesia via  orotracheal intubation, the patient was prepped and draped in the usual  fashion for a procedure of this type.   Initially the oral cavity was thoroughly irrigated with normal saline and  suctioned dry and an oropharyngeal throat pack was placed which was removed  prior to the conclusion of the case. Next approximately 3 mL of 2% Lidocaine  solution with 1:100,000 solution of epinephrine was infiltrated into the  maxillary and mandibular soft tissues.   Following this teeth numbers 9, 10 and 22 were surgically extracted. The  extraction sites were debrided and irrigated. This completed the procedure.   The throat pack was removed and the patient was allowed to recover from  general anesthesia and was extubated and transported to the post anesthesia  care unit in satisfactory condition. All sponge, instrument and needle  counts were correct at the conclusion of the case.                                                Sable Feil., D.D.S.    JWB/MEDQ  D:  11/01/2002  T:  11/02/2002  Job:  [id number]

## 2010-08-27 NOTE — Discharge Summary (Signed)
[facility]  Patient:    MAYUMI, SUMMERSON Visit Number: [id number] MRN: [id number]          Service Type: MED Location: [phone] Attending Physician:  Edwyna Perfect Dictated by:   Ladell Pier, M.D. Admit Date:  08/08/2001 Disc. Date: 08/14/01                             Discharge Summary  ADMISSION DIAGNOSIS:  Overdose.  DISCHARGE DIAGNOSES: 1. Drug overdose of her psychiatric medications. 2. Schizoaffective disorder. 3. Depression. 4. Prolonged Q-T. 5. Urinary retention. 6. Hypokalemia. 7. Tobacco abuse. 8. Dystonia.  DISCHARGE MEDICATIONS: 1. Geodon 30 mg p.o. b.i.d. 2. Klonopin 1 mg p.o. t.i.d. 3. Cogentin 2 mg p.o. b.i.d. 4. Geodon 20 mg IM q.4h. p.r.n. maximum of 120 mg per day.  FOLLOW UP APPOINTMENTS:  The patient is being transferred to [facility].  She is involuntarily committed for two weeks per myself, Dr. Olena Leatherwood and Dr. Jeanie Sewer.  CONSULTS: 1. Dr. Jeanie Sewer, psychiatry. 2. Renal.  PROCEDURES: None.  HISTORY OF PRESENT ILLNESS:  Ms. Regner is a 60 year old, white female brought to the emergency department by EMS secondary to drug overdose after and overdose after an argument with her father.  Most of the history was not able to be obtained because patient had catatonic mutism but per conversation with her father, over the past month the patient has become progressively paranoid stating that someone is going to kill her, changed the locks on her door and has decreased appetite and hallucinating.  She went to the doctor on July 23, 2001 and had her medication of her admission medications of Klonopin, benztropine, Trazodone, Neurontin, Temazepam and Trimethobenzamide filled and father found the bottles empty.  PAST MEDICAL HISTORY:  Significant for schizoaffective disorder, depression, and tobacco use. Per patients father she had had also dystonia.  Father stated that she has had dystonia since she  was 59 years old.  FAMILY HISTORY:  Unknown.  SOCIAL HISTORY:  She smokes. No alcohol use. She has had one year of college. Normally her family takes her around secondary to her mental illness. She is a member of the [employer].  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 Normally she is in good function until she gets these psychotic episodes.  ADMISSION MEDICATIONS: 1. Benztropine 2 mg t.i.d. 2. Trazodone 50 mg 1/2 q.d. 3. Neurontin 600 mg two b.i.d. 4. Clonazepam 2 mg t.i.d. 5. Temazepam 15 mg q.h.s. 6. Trimethobenzamide 250 mg q.6h.  PHYSICAL EXAMINATION:  VITAL SIGNS ON ADMISSION:  Temperature 98.2, blood pressure 109/60, pulse 86, respirations 16, oxygen saturation 97% on room air.  GENERAL:  She is a thin, white female.  HEENT:  On admission her pupils were dilated 5 mm, sluggishly reactive.  Today her pupils are equal, round and reactive to light.  NECK: Supple.  LUNGS: Clear to auscultation bilaterally.  CARDIOVASCULAR:  Tachycardic. S1 and S2 constant.  ABDOMEN: Soft and nontender.  Positive bowel sounds.  EXTREMITIES:  Without edema.  2+ DP pulse bilaterally.  NEUROLOGIC: She is alert, very psychotic.  Talking to the wall.  Constantly active and this is present neurologic status.  HOSPITAL COURSE:  #1 - DRUG OVERDOSE:  She was admitted to the intensive care unit and monitored on telemetry without event.  #2 - PROLONGED Q-T:  That resolved on the second day of her admission.  #3 - SCHIZOAFFECTIVE DISORDER:  Dr. Jeanie Sewer was  called, came in and interviewed patient and between ourselves the patient was of harm to herself and we started her on Geodon scheduled and p.r.n., progressively started back her Klonopin. The patient calmed down for a few days but after that she became psychotic again.  She was then transferred to [facility].  #4 - HYPOKALEMIA:  It was unclear of the etiology of her hypokalemia.  She was not having diarrhea, was not  vomiting.  All the medications on her list did not seem to have a side effect of hypokalemia. She was repleted and her potassium remained stable.  On discharge urine potassium, sodium and creatinine were checked and were pending.  She was not on any diuretics.  Most likely the reason for her hypokalemia is poor intake and because her BUN was less than 1 which also corrected to 9, although it was 15 when she was admitted, so mostly she was dehydrated and with rehydration and equilibration of fluid and probably her low potassium was from potassium shift.  DISCHARGE LABORATORY DATA:  Blood gas:  pH 7.45, pCO2 33, pO2 150, bicarbonate 22, oxygen 98%.  BMET: Sodium 140, potassium 4.3, chloride 111, CO2 24, glucose 85, BUN 9, creatinine 0.6, calcium 9.3, magnesium 1.6.  Cardiac enzymes: Creatinine kinase 97, CK MB 1.9, troponin 0.01.  X-RAYS:  Head CT:  No acute intracranial abnormality. Dictated by:   Ladell Pier, M.D. Attending Physician:  Edwyna Perfect DD:  08/14/01 TD:  08/14/01 Job: [id number]

## 2010-09-02 ENCOUNTER — Encounter: Payer: Self-pay | Admitting: Sports Medicine

## 2010-09-02 ENCOUNTER — Ambulatory Visit (INDEPENDENT_AMBULATORY_CARE_PROVIDER_SITE_OTHER): Payer: Medicare Other | Admitting: Sports Medicine

## 2010-09-02 DIAGNOSIS — J449 Chronic obstructive pulmonary disease, unspecified: Secondary | ICD-10-CM

## 2010-09-02 DIAGNOSIS — S42309A Unspecified fracture of shaft of humerus, unspecified arm, initial encounter for closed fracture: Secondary | ICD-10-CM

## 2010-09-02 DIAGNOSIS — J4489 Other specified chronic obstructive pulmonary disease: Secondary | ICD-10-CM

## 2010-09-02 DIAGNOSIS — K5900 Constipation, unspecified: Secondary | ICD-10-CM

## 2010-09-02 DIAGNOSIS — F209 Schizophrenia, unspecified: Secondary | ICD-10-CM

## 2010-09-02 DIAGNOSIS — G40909 Epilepsy, unspecified, not intractable, without status epilepticus: Secondary | ICD-10-CM

## 2010-09-02 DIAGNOSIS — E039 Hypothyroidism, unspecified: Secondary | ICD-10-CM

## 2010-09-02 DIAGNOSIS — E785 Hyperlipidemia, unspecified: Secondary | ICD-10-CM

## 2010-09-02 DIAGNOSIS — K5909 Other constipation: Secondary | ICD-10-CM | POA: Insufficient documentation

## 2010-09-02 LAB — COMPREHENSIVE METABOLIC PANEL
ALT: 17 U/L (ref 0–35)
Albumin: 4 g/dL (ref 3.5–5.2)
Alkaline Phosphatase: 118 U/L — ABNORMAL HIGH (ref 39–117)
CO2: 22 mEq/L (ref 19–32)
Glucose, Bld: 82 mg/dL (ref 70–99)
Potassium: 4.4 mEq/L (ref 3.5–5.3)
Sodium: 140 mEq/L (ref 135–145)
Total Protein: 6.9 g/dL (ref 6.0–8.3)

## 2010-09-02 LAB — CBC
Hemoglobin: 13.5 g/dL (ref 12.0–15.0)
MCHC: 32.1 g/dL (ref 30.0–36.0)
RBC: 4.49 MIL/uL (ref 3.87–5.11)

## 2010-09-02 LAB — LIPID PANEL
LDL Cholesterol: 81 mg/dL (ref 0–99)
VLDL: 41 mg/dL — ABNORMAL HIGH (ref 0–40)

## 2010-09-02 MED ORDER — FLUTICASONE-SALMETEROL 250-50 MCG/DOSE IN AEPB: 1.0000 | INHALATION_SPRAY | Freq: Two times a day (BID) | RESPIRATORY_TRACT | Status: DC

## 2010-09-02 MED ORDER — LORAZEPAM 0.5 MG PO TABS: 0.5000 mg | ORAL_TABLET | Freq: Three times a day (TID) | ORAL | Status: DC | PRN

## 2010-09-02 MED ORDER — LUBIPROSTONE 24 MCG PO CAPS: 24.0000 ug | ORAL_CAPSULE | Freq: Two times a day (BID) | ORAL | Status: AC

## 2010-09-02 NOTE — Assessment & Plan Note (Signed)
Will rx amitiza. GI referral, due for colonoscopy.

## 2010-09-02 NOTE — Progress Notes (Signed)
Addended by: Rodney Langton on: 09/02/2010 04:57 PM   Modules accepted: Orders

## 2010-09-02 NOTE — Assessment & Plan Note (Addendum)
Controlled, has psychiatrist. Refilling ativan.

## 2010-09-02 NOTE — Patient Instructions (Signed)
Great to see you, Referrals to Neurology, Gastroenterology for colonoscopy, and pain clinic.  We will call you with appts. Refilled lorazepam. Called in Amitiza. Stat advair as directed for breathing.  Take this every day 2x a day regardless of symptoms. Come back to see [place] in 6 weeks to reassess your breathing.  Ihor Austin. Benjamin Stain, M.D.

## 2010-09-02 NOTE — Assessment & Plan Note (Signed)
Checking Lipids.

## 2010-09-02 NOTE — Assessment & Plan Note (Signed)
S/P ORIF. Needs pain MD, was fired from previous pain clinic.

## 2010-09-02 NOTE — Assessment & Plan Note (Signed)
Will go ahead and Rx advair.

## 2010-09-02 NOTE — Assessment & Plan Note (Signed)
Pt req referral to Dr. Sandria Manly with neurology.

## 2010-09-02 NOTE — Assessment & Plan Note (Signed)
Checking TSH.

## 2010-09-02 NOTE — Progress Notes (Signed)
  Subjective:    Patient ID: Dominique Acosta, female    DOB: 11-19-1950, 60 y.o.   MRN: [id number]  HPI  Hypothyroid:  Due for TSH.  Thought disorder:  On Abilify, benadryl, ativan, Effexor.  Has psychiatrist.  Needs refill on ativan.  Constipation:  Would like to try amitiza.  Has tried stool softeners, laxatives, and bulking agents.  Last colonscopy in [place] ~ 10 years ago, negative.  Chronic pain: Fired from pain clinic for not having opiates they rx'ed in system.  She understands we will not be rxing opiates.  Review of Systems    See HPI Objective:   Physical Exam  Constitutional: She appears well-developed and well-nourished. No distress.  Cardiovascular: Normal rate, regular rhythm and normal heart sounds.  Exam reveals no gallop and no friction rub.   No murmur heard. Pulmonary/Chest: Effort normal. No respiratory distress. She has wheezes. She has no rales. She exhibits no tenderness.       Exp wheeze  Skin: Skin is warm and dry.          Assessment & Plan:

## 2010-09-03 ENCOUNTER — Telehealth: Payer: Self-pay | Admitting: *Deleted

## 2010-09-03 ENCOUNTER — Encounter: Payer: Self-pay | Admitting: Sports Medicine

## 2010-09-03 DIAGNOSIS — F209 Schizophrenia, unspecified: Secondary | ICD-10-CM

## 2010-09-03 NOTE — Telephone Encounter (Signed)
PA required for Amitiza. Form placed in MD box for completion.

## 2010-09-03 NOTE — Telephone Encounter (Signed)
Pt is calling back, says someone called her but she wasn't able to get to the phone.

## 2010-09-03 NOTE — Telephone Encounter (Signed)
Informed pt of appt with Dr. Randa Evens 6.5.12 @ 930am. Pt informed me that she could not get transportation for that time and that the appt would need to be changed. I gave pt the # for their office [phone] to call and reschedule her appt she agreed.Laureen Ochs, Viann Shove

## 2010-09-03 NOTE — Telephone Encounter (Signed)
Requesting to speak with RN re: Rxs from yesterday, has questions.

## 2010-09-03 NOTE — Telephone Encounter (Signed)
Patient was concerned because she didn't get the medication for constipation. Advised her that it requires a a PA and this form has been placed in MD box and he will fill out to send to insurance company    also she states RX for ativan was only for # 30 tabs . She can take it three times a day but only 30 tabs were given . Will send message to MD to ask for more tabs

## 2010-09-07 ENCOUNTER — Ambulatory Visit: Payer: Self-pay | Admitting: Pharmacist

## 2010-09-07 MED ORDER — LORAZEPAM 0.5 MG PO TABS: 0.5000 mg | ORAL_TABLET | Freq: Three times a day (TID) | ORAL | Status: DC | PRN

## 2010-09-07 NOTE — Telephone Encounter (Signed)
Patient notified that rx for ativan for additional tabs is ready to pick up.

## 2010-09-07 NOTE — Telephone Encounter (Signed)
Faxed PA to insurance company °

## 2010-09-07 NOTE — Telephone Encounter (Signed)
Addended by: Monica Becton on: 09/07/2010 09:30 AM   Modules accepted: Orders

## 2010-09-07 NOTE — Telephone Encounter (Signed)
Approval received for Amitiza from [employer].

## 2010-09-09 ENCOUNTER — Telehealth: Payer: Self-pay | Admitting: Sports Medicine

## 2010-09-09 NOTE — Telephone Encounter (Signed)
Spoke with Dominique Acosta and told her that all of her referrals have been faxed in and the referral providers will contact her with time and date of appts. Pt understood and was thankful.Laureen Ochs, Viann Shove

## 2010-09-09 NOTE — Telephone Encounter (Signed)
CNA calling requesting paperwork to be faxed to Dr. Sandria Manly at [phone] for pts neurologist, says this needs to be done asap so they can get pt an appt on 6/26 or 6/29.

## 2010-09-21 ENCOUNTER — Telehealth: Payer: Self-pay | Admitting: Sports Medicine

## 2010-09-21 NOTE — Telephone Encounter (Signed)
Spoke with patient and informed her that pain clinic referral was just faxed out. She informed me that she has already made appointments to her other referrals. Neurology 6/21 and GI 6/19.Dominique Acosta

## 2010-09-21 NOTE — Telephone Encounter (Signed)
Wants to know about pain clinic referral

## 2010-09-29 ENCOUNTER — Telehealth: Payer: Self-pay | Admitting: *Deleted

## 2010-09-29 NOTE — Telephone Encounter (Signed)
Spoke with patient's CNA and I asked her if she knew of any other pain clinics patient has been to. I have received 2 requests asking for previous pain clinic notes before pain clinic appointment can be made. She will ask and inform us later

## 2010-10-12 ENCOUNTER — Encounter: Payer: Self-pay | Admitting: Pharmacist

## 2010-10-12 ENCOUNTER — Ambulatory Visit (INDEPENDENT_AMBULATORY_CARE_PROVIDER_SITE_OTHER): Payer: Medicare Other | Admitting: Pharmacist

## 2010-10-12 ENCOUNTER — Other Ambulatory Visit: Payer: Self-pay | Admitting: Sports Medicine

## 2010-10-12 VITALS — Ht 61.0 in | Wt 184.0 lb

## 2010-10-12 DIAGNOSIS — F17200 Nicotine dependence, unspecified, uncomplicated: Secondary | ICD-10-CM

## 2010-10-12 DIAGNOSIS — G40909 Epilepsy, unspecified, not intractable, without status epilepticus: Secondary | ICD-10-CM

## 2010-10-12 NOTE — Assessment & Plan Note (Signed)
Patient with Severe Nicotine abuse who is currently preparing for quit attempt.  She appears to have a low level of motivation at this time. She did however agree to attempt dosage reduction in tobacco intake.  She agreed to decrease her utilization to less than 2 ppd AND possibly less in the next two weeks.  I would attempt patch (21mg ) and nicotrol inhaler in combination if patient is able to make considerable progress in reducing tobacco intake.   TTFFC:  35 minutes.  Next visit in Rx clinic in 2 weeks.

## 2010-10-12 NOTE — Progress Notes (Signed)
  Subjective:    Patient ID: Dominique Acosta, female    DOB: December 05, 1950, 60 y.o.   MRN: [id number]  HPI Patient arrives to visit with [facility] Claris Che (patient refers to her as "Marge").  She appears eager to talk.   She describes Chain Smoking 3 PPD since her mother died in 06-13-2022.    She has numerous complaints and family stressors.   She is willing to attempt a reduction in tobacco use as her next step.   Review of Systems     Objective:   Physical Exam    Appears to have akathesia.       Assessment & Plan:  Tobacco Abuse.  Patient with Severe Nicotine abuse who is currently preparing for quit attempt.  She appears to have a low level of motivation at this time. She did however agree to attempt dosage reduction in tobacco intake.  She agreed to decrease her utilization to less than 2 ppd AND possibly less in the next two weeks.  I would attempt patch (21mg ) and nicotrol inhaler in combination if patient is able to make considerable progress in reducing tobacco intake.   TTFFC:  35 minutes.  Next visit in Rx clinic in 2 weeks.

## 2010-10-12 NOTE — Telephone Encounter (Signed)
Refill request

## 2010-10-12 NOTE — Patient Instructions (Signed)
Cut down to 2 ppd or less in the next 2 weeks.  Follow up in 2 weeks.

## 2010-10-14 NOTE — Progress Notes (Signed)
  Subjective:    Patient ID: Dominique Acosta, female    DOB: 06-09-1950, 60 y.o.   MRN: [id number]  HPI Reviewed and agree with Dr. Raymondo Band management.      Review of Systems     Objective:   Physical Exam        Assessment & Plan:

## 2010-10-26 ENCOUNTER — Ambulatory Visit: Payer: Medicare Other | Admitting: Pharmacist

## 2010-11-22 ENCOUNTER — Encounter: Payer: Self-pay | Admitting: Pharmacist

## 2010-11-22 ENCOUNTER — Ambulatory Visit (INDEPENDENT_AMBULATORY_CARE_PROVIDER_SITE_OTHER): Payer: Medicare Other | Admitting: Pharmacist

## 2010-11-22 ENCOUNTER — Telehealth: Payer: Self-pay | Admitting: Family Medicine

## 2010-11-22 DIAGNOSIS — F17200 Nicotine dependence, unspecified, uncomplicated: Secondary | ICD-10-CM

## 2010-11-22 DIAGNOSIS — S42293A Other displaced fracture of upper end of unspecified humerus, initial encounter for closed fracture: Secondary | ICD-10-CM

## 2010-11-22 DIAGNOSIS — F209 Schizophrenia, unspecified: Secondary | ICD-10-CM

## 2010-11-22 MED ORDER — ALBUTEROL 90 MCG/ACT IN AERS: 2.0000 | INHALATION_SPRAY | RESPIRATORY_TRACT | Status: DC

## 2010-11-22 NOTE — Progress Notes (Signed)
  Subjective:    Patient ID: Dominique Acosta, female    DOB: 1950-10-31, 60 y.o.   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  She exhibited frustration with many elements of her life including legal issues with family, recent deaths, money, pain, smoking.  She has a significant history of mental illness being followed by multiple providers.    She reports smoking 1 1/2 to 2 packs per day, and has tried using an Photographer, which she feels helps to reduce the number of regular cigarettes she uses, though cost is an issue.  She reported not smoking for a length of 8 hours while shopping.  Unable to identify activities which give her enjoyment.    History  Smoking status  . Current Everyday Smoker  Smokeless tobacco  . Not on file   Review of Systems     Objective:   Physical Exam        Assessment & Plan:  Ms. Dominique Acosta has a longstanding history of severe nicotine dependence.  Her chance of long-term success with quitting is poor due to mental illness, family stress, and no support system.  She does express a desire to quit, but acknowledges lack of motivation and interest in daily activities.  CNA appears to be a supportive advocate.  Proposed goal of cutting back to below 1 1/2 packs per day before next visit, patient expressed understanding and willingness to accept plan.  Next visit in one month with Dr. Raymondo Band.  Total time spent counseling patient: . Patient seen with Audry Riles, PharmD Candidate and Albertine Grates, Pharmacy Resident

## 2010-11-22 NOTE — Telephone Encounter (Signed)
[facility] [address], they left without the Rx for Albuterol.  Was in and saw Dr. Raymondo Band.  Dominique Acosta are hoping he can take care of this right away.

## 2010-11-22 NOTE — Assessment & Plan Note (Signed)
Ms. Dominique Acosta has a longstanding history of severe nicotine dependence.  Her chance of long-term success with quitting is poor due to mental illness, family stress, and no support system.  She does express a desire to quit, but acknowledges lack of motivation and interest in daily activities.  CNA appears to be a supportive advocate.  Proposed goal of cutting back to below 1 1/2 packs per day before next visit, patient expressed understanding and willingness to accept plan.  Next visit in one month with Dr. Raymondo Band.  Total time spent counseling patient: . Patient seen with Audry Riles, PharmD Candidate and Albertine Grates, Pharmacy Resident

## 2010-11-22 NOTE — Patient Instructions (Addendum)
You have made great progress cutting back, keep it up!  My goal: cut back to less than 1 1/2 packs per day  Next visit with Dr. Raymondo Band in one month

## 2010-11-22 NOTE — Progress Notes (Signed)
  Subjective:    Patient ID: Dominique Acosta, female    DOB: 1950-10-09, 60 y.o.   MRN: [id number]  HPI Reviewed and agree with Dr. Macky Lower management    Review of Systems     Objective:   Physical Exam        Assessment & Plan:

## 2010-12-21 ENCOUNTER — Ambulatory Visit (INDEPENDENT_AMBULATORY_CARE_PROVIDER_SITE_OTHER): Payer: Medicare Other | Admitting: Pharmacist

## 2010-12-21 ENCOUNTER — Encounter: Payer: Self-pay | Admitting: Pharmacist

## 2010-12-21 VITALS — BP 95/67 | HR 97 | Ht 61.0 in | Wt 168.0 lb

## 2010-12-21 DIAGNOSIS — F17200 Nicotine dependence, unspecified, uncomplicated: Secondary | ICD-10-CM

## 2010-12-21 NOTE — Patient Instructions (Signed)
Good luck with quitting smoking this week. Quit date 12/24/10 after finishing the 2 packs of cigarettes you already have. Try to increase walking outside and being more active with cooler weather. Keep yourself busy with reading and writing to keep your mind off of smoking. Avoid stress and other triggers that make you want to have a cigarette. If you find any hidden money, buy yourself a treat or new piece of clothing instead of buying cigarettes. Follow up with me in 6-8 weeks.

## 2010-12-21 NOTE — Progress Notes (Signed)
  Subjective:    Patient ID: Dominique Acosta, female    DOB: 09/02/1950, 60 y.o.   MRN: [id number]  HPI Reviewed and agree with Dr. Koval's management    Review of Systems     Objective:   Physical Exam        Assessment & Plan:   

## 2010-12-21 NOTE — Assessment & Plan Note (Signed)
Severe Nicotine Dependence of long standing duration in a patient who is poor-to- fair candidate for success b/c of current level of motivation and improved pain control.    Patient will continue on current supply of nicotine cartridges.  Written information provided.  F/U Rx Clinic Visit  In 6-8 weeks. Total time with patient in face-to-face counseling 45 minutes.  Patient seen with Dominique Acosta, PharmD Candidate and Dominique Acosta, Pharmacy Resident.

## 2010-12-21 NOTE — Progress Notes (Signed)
  Subjective:    Patient ID: Kamri Gotsch, female    DOB: 25-Apr-1950, 60 y.o.   MRN: [id number]  HPI  Patient continues to utilize 1 ppd in addition to nicotine cartridges. Due to financial concerns, patient is being forced to quit by the end of the week (states she has 2 packs remaining and cannot afford to buy more cigarettes).  Review of Systems     Objective:   Physical Exam        Assessment & Plan:   Severe Nicotine Dependence of long standing duration in a patient who is poor-to- fair candidate for success b/c of current level of motivation and improved pain control.    Patient will continue on current supply of nicotine cartridges.  Written information provided.  F/U Rx Clinic Visit  In 6-8 weeks. Total time with patient in face-to-face counseling 45 minutes.  Patient seen with Tomi Bamberger, PharmD Candidate and Benjaman Pott, Pharmacy Resident.

## 2011-01-20 ENCOUNTER — Encounter: Payer: Self-pay | Admitting: Family Medicine

## 2011-01-20 ENCOUNTER — Ambulatory Visit (INDEPENDENT_AMBULATORY_CARE_PROVIDER_SITE_OTHER): Payer: Medicare Other | Admitting: Family Medicine

## 2011-01-20 DIAGNOSIS — G40909 Epilepsy, unspecified, not intractable, without status epilepticus: Secondary | ICD-10-CM

## 2011-01-20 DIAGNOSIS — F259 Schizoaffective disorder, unspecified: Secondary | ICD-10-CM

## 2011-01-20 DIAGNOSIS — Z23 Encounter for immunization: Secondary | ICD-10-CM

## 2011-01-20 MED ORDER — ARIPIPRAZOLE 20 MG PO TABS: 20.0000 mg | ORAL_TABLET | Freq: Every day | ORAL | Status: DC

## 2011-01-20 NOTE — Assessment & Plan Note (Signed)
Sees Dr. Sandria Manly. Increased Neurontin and does not have seizures at this time.

## 2011-01-20 NOTE — Assessment & Plan Note (Signed)
Unsure of diagnosis. Would need more time to interview patient. Will refill her Abilify 20 mg for mood disorder. Will refer to Dr. Norm Parcel for evaluation and clarification of diagnosis.

## 2011-01-20 NOTE — Patient Instructions (Signed)
Mood Disorders Mood disorders are conditions that affect the way a person feels emotionally. The main mood disorders include:  Depression.   Bipolar disorder.   Dysthymia. Symptoms of dysthymia are similar to depression, but not as severe. Dysthymia is a mild, lasting (chronic) depression.   Cyclothymia. Symptoms of cyclothymia are similar to those of bipolar disorder, but less extreme. Cyclothymia includes mood swings, but the highs and lows are not as severe as they are in bipolar disorder.  CAUSES Mood disorders are probably caused by a combination of factors. People with mood disorders seem to have physical and chemical changes in their brains. Mood disorders run in families, so there may be genetic causes. Severe trauma or stressful life events may also increase the risk of mood disorders.   SYMPTOMS   Symptoms of mood disorders depend on the specific type of condition. Symptoms of depression include:  Feeling sad, worthless, hopeless.   Negative thoughts.   Inability to enjoy one's usual activities.   Low energy.   Sleeping too much or too little.   Appetite changes.   Crying.   Problems with concentration.   Thoughts of harming oneself.  Symptoms of bipolar disorder include:  Periods of depression (see above symptoms).   Mood swings, from sadness and depression, to abnormal elation and excitement.   Periods of mania:   Racing thoughts.   Fast speech.   Poor judgment; careless, dangerous choices.   Decreased need for sleep.   Risky behavior.   Difficulty concentrating.   Irritability.   Increased energy.   Increased sex drive.  DIAGNOSIS There are no blood tests or x-rays that can confirm a mood disorder. However, your caregiver may choose to run some tests to make sure that there is not another physical cause for your symptoms. A mood disorder is usually diagnosed after an in-depth interview with a healthcare provider. TREATMENT Mood disorders can  be treated with one or more of the following:  Medicine (antidepressants, mood-stabilizers, anti-psychotics).   Psychotherapy (talk therapy).   Cognitive behavioral therapy (you are taught to recognize negative thoughts and behavior patterns, and replace them with healthy thoughts and behaviors).   Electroconvulsive therapy (for very severe cases of deep depression, a series of treatments in which an electrical current is applied to the brain).   Vagus nerve stimulation (a pulse of electricity is applied to a portion of the brain).   Transcranial magnetic stimulation (powerful magnets are placed on the head that produce electrical currents).   Hospitalization. In severe situations, or when someone is having serious thoughts of harming him or herself, hospitalization may be necessary in order to keep the person safe. This is also done to quickly start and monitor treatment.  HOME CARE INSTRUCTIONS  Take your medicine exactly as directed.   Attend all of your therapy sessions.   Try to eat regular, healthy meals.   Exercise daily - exercise is known to improve mood symptoms.   Get good sleep.   Do not use alcohol, pot, or other drugs. These can worsen mood symptoms and cause anxiety and psychosis.   If you develop any side effects, such as nausea, dry mouth, dizziness, constipation, drowsiness, tremor, weight gain, or sexual symptoms, tell your caregiver. He or she may suggest things you can do to improve symptoms.   Consider learning some ways to cope with the stress of having a chronic illness. This includes yoga, meditation, tai chi, or participating in a support group.   Drink a lot  of water and eat a high-fiber diet to avoid constipation from medicine.  SEEK IMMEDIATE MEDICAL CARE IF YOU DEVELOP:  Worsening mood.   Thoughts of hurting yourself or others.   Inability to care for yourself.   The sensation of hearing or seeing something that is not actually present (auditory  or visual hallucinations).   Abnormal thoughts.  Document Released: 01/23/2009   [facility] Patient Information 2011 [place], [place].

## 2011-01-20 NOTE — Progress Notes (Signed)
  Subjective:    Patient ID: Dominique Acosta, female    DOB: 08-12-1950, 60 y.o.   MRN: [id number]  HPI 1. Here to Meet new doc Pt. Is a new patient of mine. She has a very complex medical history. She has seen a multitude of providers. She is currently getting care from the [facility], Orthopedics, another Family medicine doctor (who she will no longer see, but has a pain contract with) She is waiting to go see pain management for dystonia. And she sees Dr. Sandria Manly with neurology for seizure disorder. Her main complaint today is a refill of her Abilify.  She was seen by Dr. Solmon Ice a new psychiatrist with the [facility]. She however does not have records of what was prescribed her, but says she did not get any medication from her. She has been on Abilify in the past for Schizophrenia, but there is no formal eval. For schizophrenia and the patient denies this diagnosis. She says she has a mood disorder, but not sure what.   2. Mood Disorder of unknown type See above.   Review of Systems  Constitutional: Negative for fever, activity change, appetite change and unexpected weight change.  Eyes: Negative for visual disturbance.  Respiratory: Negative for shortness of breath.   Cardiovascular: Negative for chest pain, palpitations and leg swelling.  Gastrointestinal: Negative for abdominal pain.  Musculoskeletal: Positive for myalgias.  Neurological: Negative for seizures, syncope, speech difficulty and headaches.  Psychiatric/Behavioral: Positive for dysphoric mood. Negative for suicidal ideas, hallucinations, behavioral problems, self-injury and decreased concentration. The patient is nervous/anxious.        Objective:   Physical Exam  Nursing note and vitals reviewed. Constitutional: She appears well-developed and well-nourished. No distress.       obese  Cardiovascular: Normal rate, regular rhythm and normal heart sounds.   Pulmonary/Chest: Effort normal and breath sounds normal.  No respiratory distress.  Skin: She is not diaphoretic.      Assessment & Plan:

## 2011-01-26 ENCOUNTER — Ambulatory Visit (INDEPENDENT_AMBULATORY_CARE_PROVIDER_SITE_OTHER): Payer: Medicare Other | Admitting: Family Medicine

## 2011-01-26 ENCOUNTER — Encounter: Payer: Self-pay | Admitting: Family Medicine

## 2011-01-26 DIAGNOSIS — Z1211 Encounter for screening for malignant neoplasm of colon: Secondary | ICD-10-CM

## 2011-01-26 DIAGNOSIS — Z1231 Encounter for screening mammogram for malignant neoplasm of breast: Secondary | ICD-10-CM

## 2011-01-26 DIAGNOSIS — F4321 Adjustment disorder with depressed mood: Secondary | ICD-10-CM

## 2011-01-26 DIAGNOSIS — Z1239 Encounter for other screening for malignant neoplasm of breast: Secondary | ICD-10-CM

## 2011-01-26 DIAGNOSIS — F319 Bipolar disorder, unspecified: Secondary | ICD-10-CM

## 2011-01-26 NOTE — Patient Instructions (Signed)
Please call Dr. Pascal Lux and set up an appointment with her ASAP.  Depression You have signs of depression. This is a common problem. It can occur at any age. It is often hard to recognize. People can suffer from depression and still have moments of enjoyment. Depression interferes with your basic ability to function in life. It upsets your relationships, sleep, eating, and work habits. CAUSES Depression is believed to be caused by an imbalance in brain chemicals. It may be triggered by an unpleasant event. Relationship crises, a death in the family, financial worries, retirement, or other stressors are normal causes of depression. Depression may also start for no known reason. Other factors that may play a part include medical illnesses, some medicines, genetics, and alcohol or drug abuse. SYMPTOMS  Feeling unhappy or worthless.   Long-lasting (chronic) tiredness or worn-out feeling.   Self-destructive thoughts and actions.   Not being able to sleep or sleeping too much.   Eating more than usual or not eating at all.   Headaches or feeling anxious.   Trouble concentrating or making decisions.   Unexplained physical problems and substance abuse.  TREATMENT Depression usually gets better with treatment. This can include:  Antidepressant medicines. It can take weeks before the proper dose is achieved and benefits are reached.   Talking with a therapist, clergyperson, counselor, or friend. These people can help you gain insight into your problem and regain control of your life.   Eating a good diet.   Getting regular physical exercise, such as walking for 30 minutes every day.   Not abusing alcohol or drugs.  Treating depression often takes 6 months or longer. This length of treatment is needed to keep symptoms from returning. Call your caregiver and arrange for follow-up care as suggested. SEEK IMMEDIATE MEDICAL CARE IF:  You start to have thoughts of hurting yourself or others.    Call your local emergency services (911 in [place]   Go to your local medical emergency department.   Call the National Suicide Prevention Lifeline: 1-800-273-TALK 709-051-3933).  Document Released: 03/28/2005 Document Re-Released: 09/15/2009 [facility] Patient Information 2011 [place], [place].

## 2011-01-26 NOTE — Progress Notes (Signed)
  Subjective:   Dominique Acosta is an 60 y.o. female who presents for evaluation and treatment of depressive symptoms.  Onset approximately 3 years ago, unchanged since that time.  Current symptoms include depressed mood, hypersomnia, psychomotor retardation, fatigue, difficulty concentrating, hopelessness, anxiety, loss of energy/fatigue, weight loss and decreased appetite.  Current treatment for depression:Medication Sleep problems: Mild   Early awakening:Absent   Energy: Deteriorated Motivation: Poor Concentration: Poor Rumination/worrying: Moderate Memory: Good Tearfulness: Mild  Anxiety: Moderate  Panic: Moderate  Overall Mood: Moderately worse  Hopelessness: Mild Suicidal ideation: Absent  Other/Psychosocial Stressors: grief from death of 9 friends and family in last 5 years. Family history positive for depression in the patient's unknown.  Previous treatment modalities employed include Medication.  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   Review of Systems Pertinent items are noted in HPI.   Objective:   Mental Status Examination: Posture and motor behavior: appears hyperactive Dress, grooming, personal hygiene: hygiene good, appears histrionic, wearing see through blouse without bra. Facial expression: appears upset Speech: Appropriate Mood: appears hopeless, and upset Coherency and relevance of thought: Positive for tangential thinking Thought content: no paranoid thoughts. Seems irritable. Perceptions: Negative Orientation:Appropriate Attention and concentration: Appropriate Memory: : Not examined Information: Not examined Vocabulary: Appropriate Abstract reasoning: Appropriate Judgment: appears off    Assessment:   Experiencing the following symptoms of depression most of the day nearly every day for more than two consecutive weeks: depressed mood, loss of interests/pleasure, change in  appetite or weight, change in psychomotor activity, loss of energy, trouble concentrating, thoughts of worthlessness or guilt  Depressive Disorder: appears combined with mania. Likely bipolar.  Suicide Risk Assessment: not suicidal  Suicidal intent: no Suicidal plan: no3 Access to means for suicide: no Lethality of means for suicide: no Prior suicide attempts: no Recent exposure to suicide:no   Plan:    1. Screening for breast cancer  MM Digital Screening  2. Screening for colon cancer      Reviewed concept of depression as biochemical imbalance of neurotransmitters and rationale for treatment. Instructed patient to contact office or on-call physician promptly should condition worsen or any new symptoms appear and provided on-call telephone numbers.    Will continue her Abilify as this seems to be helping somewhat. [facility]. She filled out a PHQ 9 with a score of 19, somewhat difficult. She also filled out a MDQ. Both documents should be scanned in the system.

## 2011-01-27 ENCOUNTER — Other Ambulatory Visit: Payer: Self-pay | Admitting: Family Medicine

## 2011-01-27 MED ORDER — AZITHROMYCIN 250 MG PO TABS: ORAL_TABLET | ORAL | Status: AC

## 2011-01-27 NOTE — Telephone Encounter (Signed)
[facility] for the [facility] [address] yesterday, but it was not there.  The Aide was adamant that this needs to happen as soon as possible because the patient is not feeling well.  She also would like a call back when this has been completed.

## 2011-02-14 ENCOUNTER — Ambulatory Visit (INDEPENDENT_AMBULATORY_CARE_PROVIDER_SITE_OTHER): Payer: Medicare Other | Admitting: Psychology

## 2011-02-14 ENCOUNTER — Ambulatory Visit (INDEPENDENT_AMBULATORY_CARE_PROVIDER_SITE_OTHER): Payer: Medicare Other | Admitting: Family Medicine

## 2011-02-14 ENCOUNTER — Encounter: Payer: Self-pay | Admitting: Family Medicine

## 2011-02-14 VITALS — BP 97/62 | HR 100 | Temp 98.3°F | Wt 165.0 lb

## 2011-02-14 DIAGNOSIS — R197 Diarrhea, unspecified: Secondary | ICD-10-CM

## 2011-02-14 DIAGNOSIS — F259 Schizoaffective disorder, unspecified: Secondary | ICD-10-CM

## 2011-02-14 NOTE — Patient Instructions (Signed)
Viral Gastroenteritis Viral gastroenteritis is also known as stomach flu. This condition affects the stomach and intestinal tract. The illness typically lasts 3 to 8 days. Most people develop an immune response. This eventually gets rid of the virus. While this natural response develops, the virus can make you quite ill.   CAUSES   Diarrhea and vomiting are often caused by a virus. Medicines (antibiotics) that kill germs will not help unless there is also a germ (bacterial) infection. SYMPTOMS   The most common symptom is diarrhea. This can cause severe loss of fluids (dehydration) and body salt (electrolyte) imbalance. TREATMENT   Treatments for this illness are aimed at rehydration. Antidiarrheal medicines are not recommended. They do not decrease diarrhea volume and may be harmful. Usually, home treatment is all that is needed. The most serious cases involve vomiting so severely that you are not able to keep down fluids taken by mouth (orally). In these cases, intravenous (IV) fluids are needed. Vomiting with viral gastroenteritis is common, but it will usually go away with treatment. HOME CARE INSTRUCTIONS   Small amounts of fluids should be taken frequently. Large amounts at one time may not be tolerated. Plain water may be harmful in infants and the elderly. Oral rehydration solutions (ORS) are available at pharmacies and grocery stores. ORS replace water and important electrolytes in proper proportions. Sports drinks are not as effective as ORS and may be harmful due to sugars worsening diarrhea.  As a general guideline for children, replace any new fluid losses from diarrhea or vomiting with ORS as follows:     If your child weighs 22 pounds or under (10 kg or less), give 60-120 mL (1/4 - 1/2 cup or 2 - 4 ounces) of ORS for each diarrheal stool or vomiting episode.     If your child weighs more than 22 pounds (more than 10 kgs), give 120-240 mL (1/2 - 1 cup or 4 - 8 ounces) of ORS for  each diarrheal stool or vomiting episode.     In a child with vomiting, it may be helpful to give the above ORS replacement in 5 mL (1 teaspoon) amounts every 5 minutes, then increase as tolerated.     While correcting for dehydration, children should eat normally. However, foods high in sugar should be avoided because this may worsen diarrhea. Large amounts of carbonated soft drinks, juice, gelatin desserts, and other highly sugared drinks should be avoided.     After correction of dehydration, other liquids that are appealing to the child may be added. Children should drink small amounts of fluids frequently and fluids should be increased as tolerated.     Adults should eat normally while drinking more fluids than usual. Drink small amounts of fluids frequently and increase as tolerated. Drink enough water and fluids to keep your urine clear or pale yellow. Broths, weak decaffeinated tea, lemon-lime soft drinks (allowed to go flat), and ORS replace fluids and electrolytes.     Avoid:    Carbonated drinks.     Juice.    Extremely hot or cold fluids.     Caffeine drinks.     Fatty, greasy foods.     Alcohol.    Tobacco.    Too much intake of anything at one time.     Gelatin desserts.     Probiotics are active cultures of beneficial bacteria. They may lessen the amount and number of diarrheal stools in adults. Probiotics can be found in yogurt with active  cultures and in supplements.     Wash your hands well to avoid spreading bacteria and viruses.     Antidiarrheal medicines are not recommended for infants and children.     Only take over-the-counter or prescription medicines for pain, discomfort, or fever as directed by your caregiver. Do not give aspirin to children.     For adults with dehydration, ask your caregiver if you should continue all prescribed and over-the-counter medicines.     If your caregiver has given you a follow-up appointment, it is very important to  keep that appointment. Not keeping the appointment could result in a lasting (chronic) or permanent injury and disability. If there is any problem keeping the appointment, you must call to reschedule.  SEEK IMMEDIATE MEDICAL CARE IF:    You are unable to keep fluids down.     There is no urine output in 6 to 8 hours or there is only a small amount of very dark urine.     You develop shortness of breath.     There is blood in the vomit (may look like coffee grounds) or stool.     Belly (abdominal) pain develops, increases, or localizes.     There is persistent vomiting or diarrhea.     You have a fever.      MAKE SURE YOU:    Understand these instructions.     Will watch your condition.     Will get help right away if you are not doing well or get worse.  Document Released: 03/28/2005 Document Revised: 12/08/2010 Document Reviewed: 08/09/2006 [facility] Patient Information 2012 [place], [place].

## 2011-02-14 NOTE — Assessment & Plan Note (Addendum)
Will treat today as self limited gastroenteritis, supportive care with PO fluids, No evidence of bowel obstruction or severe dehydration.  If continues to be a pattern, would speak to PCP as it is possible she has underlying IBS as she is suggesting with her request for librax.  She was referred to gastroenterology this year twice but states she does not want to go.  I recommended immodium, she states she cannot afford this.  I declined her requests for librax at this time.

## 2011-02-14 NOTE — Progress Notes (Signed)
  Subjective:    Patient ID: Dominique Acosta, female    DOB: 19-Oct-1950, 60 y.o.   MRN: [id number]  HPI 1 day episodes of diarrhea.  States she has had nonstop water diarrhea for 1 day.  No blood or mucous.  Some abdominal bloating and mild pain throughout.  Denies fevers, has had one resident also with diarrhea.  States she feels dehydrated? Dizziness.  No emesis or nausea.  NO betetr or worse with food, but states everything "goes right through"  She requests rx for librax  States she has a history of chronic constipation, but every few months has diarrhea.  Here with her aide.  Review of Systems see HPI   Objective:   Physical Exam GEN: Alert & Oriented, No acute distress CV:  Regular Rate & Rhythm, no murmur Respiratory:  Normal work of breathing, CTAB Abd:  + BS, soft, bloated, no rebound or guarding.  Mild TTP throguhout. Ext: no pre-tibial edema       Assessment & Plan:

## 2011-02-15 NOTE — Assessment & Plan Note (Addendum)
Krizia has pronounced make up and is wearing a bright red kimono type wrap over black pants and shirt.  She maintains adequate eye contact.  Affect appears anxious.  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  Thought process is tangential and full of statements that may not be true such as she is a genius, an award Clinical biochemist and has written seven books (most of which remain in her head).  No evidence of current suicidal ideation.  Unsure if she is responding to any internal stimuli.  She is not able to maintain train of thought or concentrate on the questions.  Judgment and insight are poor.  Carlye has a long and complex psychiatric history.  I can not make a specific diagnosis based on my limited interaction with her and the difficulty I had eliciting a coherent history.  Would suspect that delusions are present and would also suspect a mood component making schizoaffective a possibility.  I do think delusions occur outside the context of a mood disturbance.  Katelinn fits the picture of someone who chronically, persistently, mentally ill.  She has had care in the past that is the most appropriate for this type of mental health problem - specifically, community based care.  The [facility] is not equipped (either through [facility] or Behavioral Medicine) to manage her illness.  She reports having been to the [facility] in the past and "hating" a doctor there.  Not sure what happened with the ACT team.  Hannelore seems sensitive to people spending limited time with her and changing her medications.  I wonder if a Behavioral Medicine caseworker might be a good match for her for social / psychological support in her home environment.  Will speak with her PCP and Nelva Bush, clinic social worker, to see what resources might be available to [place].  I told Shiann I would call her back when I found out some information.  At this point, I would caution adding or changing psychiatric medications in a  primary care environment.   Addendum:  Dr. Rivka Safer made referral to social work and [place] followed up with the patient with recommendations.
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
## 2011-02-16 ENCOUNTER — Telehealth: Payer: Self-pay | Admitting: Family Medicine

## 2011-02-16 DIAGNOSIS — E785 Hyperlipidemia, unspecified: Secondary | ICD-10-CM

## 2011-02-16 MED ORDER — SIMVASTATIN 80 MG PO TABS: 40.0000 mg | ORAL_TABLET | Freq: Every day | ORAL | Status: DC

## 2011-02-16 NOTE — Telephone Encounter (Signed)
States she is still having diarrhea and nausea - would like something called into pharmacy pls call to let them know [facility]

## 2011-02-16 NOTE — Telephone Encounter (Signed)
Dominique Acosta, I just spoke with patient's aide Marge. Yamilka was in here yesterday to see Dr. Pascal Lux and then was seen by Dr. Earnest Bailey for nausea & vomiting. She stated that she was told to call back if not feeling any better. Patient bought OTC meds for this and it is not helping at all. Aide would like something called in for this. Also, Dr. Pascal Lux had told them yesterday that she was going to consult with you about her visit yesterday with her. Stated that they were told that she was going to talk to you about what meds patient needs to be on and what kind and a plan of action. I inform her that you were not in clinic yesterday and was not about to speak with Dr. Pascal Lux about patient yet. Told her you are in clinic today with a full schedule and may not be able to consult till afternoon or later on about this with Dr. Pascal Lux. May be another day before a plan of action can be made. She agreed to this and was appreciative.  -----Huntley Dec

## 2011-02-16 NOTE — Telephone Encounter (Signed)
Calling back, also needs to speak with Dr. Rivka Safer about having an appt with Dr. Pascal Lux, wants to speak with him first.

## 2011-02-17 ENCOUNTER — Telehealth: Payer: Self-pay | Admitting: Family Medicine

## 2011-02-17 NOTE — Telephone Encounter (Signed)
I called and spoke with patient and her aide. Dr. Rivka Safer also witnessed this conversation that happened. Ms. Been and I discussed the Ativan issue. She told me that Dr. Benjamin Stain has prescribed her this before and she does not know what the issue is now since getting new doctor. I explained to her that this is not a preferred drug for her anymore due to her psychologic condition at this time and that Dr. Rivka Safer does not feel comfortable prescribing this to her. She then told me that she understands this and now wants to be prescribed Valium which was supposedly discussed at the visit. After Aurea Graff had changed subject several times she then gave phone to her aide.  I then spoke with aide who said pretty much the same as above but now wants to know why we are not re prescribing her the Ativan. I also informed her that at the last visit she was at it was told to both of them that the medications will not discussed or prescribed until visit with Dr. Pascal Lux and Dr. Kathrynn Running. After speaking with Dr. Rivka Safer these medications are not going to be prescribed and that patient will be needing to find a psychiatrist to handle these medications due to patient's background situation.  Will forward to [facility] to see what options we have to help patient get in to see a psychiatrist.   Forward to [facility]. Pascal Lux to [facility]

## 2011-02-17 NOTE — Telephone Encounter (Signed)
Pts aide is calling re: rx for ativan, says pt has always gotten it from Dr. Karie Schwalbe and wants to know why Dr. Rivka Safer denied it?

## 2011-02-18 ENCOUNTER — Telehealth: Payer: Self-pay | Admitting: Family Medicine

## 2011-02-18 ENCOUNTER — Telehealth: Payer: Self-pay | Admitting: Clinical

## 2011-02-18 MED ORDER — METAXALONE 800 MG PO TABS: 800.0000 mg | ORAL_TABLET | Freq: Three times a day (TID) | ORAL | Status: DC

## 2011-02-18 NOTE — Telephone Encounter (Signed)
Thank you. This is a very difficult patient and I appreciate your assistance greatly.

## 2011-02-18 NOTE — Telephone Encounter (Signed)
Clinical Social Worker received referral to assist pt. In finding a psychiatrist. Clinical Social Worker contacted pt. Who stated she was very upset with the services/ or lack of that she has received at the clinic. Clinical Social Worker provided active listening and showed empathy towards frustration. Clinical Social Worker provided pt. With contact information to several psychiatrist in the area and encouraged her to call them today. Pt. Asked that I inform her PCP of her frustration and interest in possibly pursuing services elsewhere. I informed pt. That if she needs additional resources she can contact Family Medicine and they will contact me. Theresia Bough, MSW, Theresia Majors [phone]

## 2011-02-18 NOTE — Telephone Encounter (Signed)
Medication sent in. 

## 2011-02-18 NOTE — Telephone Encounter (Signed)
Is changing pharmacies and she has no refills on her    metaxalone (SKELAXIN) 800 MG tablet    Going to [facility]

## 2011-03-08 ENCOUNTER — Encounter: Payer: Medicare Other | Attending: Physical Medicine & Rehabilitation | Admitting: Physical Medicine & Rehabilitation

## 2011-03-08 DIAGNOSIS — M47817 Spondylosis without myelopathy or radiculopathy, lumbosacral region: Secondary | ICD-10-CM

## 2011-03-08 DIAGNOSIS — F319 Bipolar disorder, unspecified: Secondary | ICD-10-CM

## 2011-03-08 DIAGNOSIS — R259 Unspecified abnormal involuntary movements: Secondary | ICD-10-CM

## 2011-03-08 DIAGNOSIS — M25519 Pain in unspecified shoulder: Secondary | ICD-10-CM | POA: Insufficient documentation

## 2011-03-08 DIAGNOSIS — G40909 Epilepsy, unspecified, not intractable, without status epilepticus: Secondary | ICD-10-CM | POA: Insufficient documentation

## 2011-03-08 DIAGNOSIS — M7500 Adhesive capsulitis of unspecified shoulder: Secondary | ICD-10-CM | POA: Insufficient documentation

## 2011-03-08 DIAGNOSIS — M545 Low back pain, unspecified: Secondary | ICD-10-CM | POA: Insufficient documentation

## 2011-03-08 DIAGNOSIS — Z79899 Other long term (current) drug therapy: Secondary | ICD-10-CM | POA: Insufficient documentation

## 2011-03-08 DIAGNOSIS — G4700 Insomnia, unspecified: Secondary | ICD-10-CM | POA: Insufficient documentation

## 2011-03-08 NOTE — Progress Notes (Signed)
REFERRING PROVIDER:  Melina Fiddler, MD  CHIEF COMPLAINT:  Shoulder and low back pain.  HISTORY OF PRESENT ILLNESS:  This is a 60 year old white female with apparent bipolar disorder and extremely complicated overall medical history, who presents today for evaluation.  I have gerth of background material on this patient, which I am sure just scratches of the surface of her medical history.  She states that she was diagnosed with "dystonia" in 1984.  She has been on medications for bipolar disorder for some time.  She states that her dystonia is not a side effect or complication of her medication but some type of familial dystonia apparently.  She tells me she has seen Dr. Avie Echevaria, for evaluation. She has had a history of seizures in her past as well.  She had a fall in April of this year with a fracture of her right humerus and has had limited range of motion since then.  She has had increased back pain and leg pain as well.  She has had more falls recently and just fell the other day.  Apparently she has a home health aide who assists her around the house.  She rarely gets out of the home.  She walks short distances within the house but uses a wheelchair outside.  She states she has an ACT team member come to the house 3 times a week for followup regarding her psychiatric issues.  Her pain is about a 5-7/10.  Described as sharp, stabbing, tingling, and aching.  Pain interferes with general activities, relations with others, enjoyment of life on a moderate level.  REVIEW OF SYSTEMS:  Notable for confusion, depression, anxiety, suicidal thoughts, loss of taste, insomnia.  Reports vomiting, constipation, dysuria, urine retention.  Full review is in the written health and history section of the chart.  SOCIAL HISTORY:  Noted above.  She is single, living alone.  She still smokes a pack of cigarettes per day.  She tells me she has experienced a loss of family members due to  deaths over the last year which has been particularly stressful for her.  She has not worked for some time, is on disability.  PAST MEDICAL HISTORY:  Notable for bipolar disorder, coronary artery disease, apparent bypass surgeries, bladder sling for incontinence and frequency and frequent UTIs, vaginal herpes, seizure disorder, history of low back pain.  Again I am sure just scratched the surface of her medical history.  CURRENT MEDICATIONS: 1. Macrodantin 100 mg at bedtime. 2. Benztropine 2 mg 1 pill t.i.d. 3. Acyclovir 200 mg a day. 4. Simvastatin 40 mg daily. 5. Abilify 20 mg q.a.m. 6. Lorazepam 0.5 q.8 hours p.r.n. 7. Venlafaxine 75 mg daily. 8. Gabapentin 800 mg 1 t.i.d. and 2 at bedtime. 9. Venlafaxine XR 150 mg b.i.d. 10.Aspirin 81 mg daily. 11.OxyContin 20 mg q.8. 12.Flurazepam 30 mg at bedtime. 13.Skelaxin 800 mg t.i.d. p.r.n. 14.Levothyroxine 112 mcg q.a.m.  FAMILY HISTORY:  Positive for heart disease, diabetes, high blood pressure, alcohol, psychiatric history, drug abuse, disability.  X-RAY:  All I have the secondhand report regarding a lumbar spine film which shows degenerative disk disease at L5-S1 and an old facet fracture with slight spondylolisthesis of L5 on S1.  PHYSICAL EXAMINATION:  VITAL SIGNS:  Blood pressure is 121/82, pulse is 91, respiratory rate 16, she is satting 98% on room air. GENERAL:  The patient is alert.  She is quite anxious.  She ambulated for me today and uses a step-to-pattern with the right leg.  She has antalgia with weightbearing on the right side.  The right leg and left leg themselves generally have full motor function.  She has multiple dystonic movements of either leg which seemed to be worse when we performed exams.  They worsen to the point that I suspect there is a non physiological aspect to these.  Upper extremities were similar in that pattern.  Strength in the upper extremity is grossly 4 5/5.  However, she is limited in  the right shoulder to about 30-40 degrees of active abduction, about 50 degrees of flexion, 40 degrees of extension, and 20 degrees of internal and external rotation.  She has pain when trying to push the shoulder to pass those movements.  Back was tender with palpation over the PSIS areas in the lower lumbar segments.  Posture was fair although she tends to be a bit more elevated on the left than the right in standing, but was able to balance out when cued.  She had pain with flexion at the 40-50 degrees more than extension.  Facet maneuvers to the right were provocative and not provocative to the left.  Rotation and lateral bending were nonspecific. CRANIAL NERVE:  Grossly intact.  From a psychological standpoint,  she is quite anxious and difficult to keep on tasks.  Thought processing was tangential and circumferential.  I had to redirect her multiple times today.  ASSESSMENT: 1. Chronic low back pain with exam and reported x-ray consistent with     lumbar spondylosis with facet and likely disk components leading to     pain. 2. Recent right humerus fracture with adhesive capsulitis of the right     shoulder. 3. Bipolar disorder. 4. Chronic dystonia. 5. Seizure disorder. 6. Insomnia.  PLAN: 1. The patient states that she is out of her OxyContin today and will     going to withdrawal as she has no more and was not told that she     needs to continue with her prior prescriber prior to [place] filling her     meds initially.  We will attempt to collect the urine drug screen     today.  I did give her 10 days worth of OxyContin to hold her over     until we have results back. 2. We will change her lorazepam to Ambien 5 mg at bedtime for sleep. 3. Refill lorazepam 0.5 q.8 hours for her anxiety. 4. Made referral to [facility] for balance assessment as well as to work     on her right shoulder and low back.  Details of my prescription are     in the carbon copy. 5. The patient needs  ongoing psychiatric management.  Obviously the     main component here is psychiatric overlay as is playing a large     role in the overall picture. 6. We will see her back here in about a month.  We will talk about     other options going forward as I get to know were a bit better.     Ranelle Oyster, M.D. Electronically Signed   ZTS/MedQ D:  03/08/2011 13:07:32  T:  03/08/2011 11:91:47  Job #:  [id number]  cc:   Melina Fiddler, MD

## 2011-04-01 ENCOUNTER — Encounter: Payer: Medicare Other | Admitting: Neurosurgery

## 2011-04-06 ENCOUNTER — Encounter: Payer: Medicare Other | Attending: Neurosurgery | Admitting: Neurosurgery

## 2011-04-06 DIAGNOSIS — S42309S Unspecified fracture of shaft of humerus, unspecified arm, sequela: Secondary | ICD-10-CM | POA: Insufficient documentation

## 2011-04-06 DIAGNOSIS — X58XXXS Exposure to other specified factors, sequela: Secondary | ICD-10-CM | POA: Insufficient documentation

## 2011-04-06 DIAGNOSIS — M545 Low back pain, unspecified: Secondary | ICD-10-CM | POA: Insufficient documentation

## 2011-04-06 DIAGNOSIS — G40909 Epilepsy, unspecified, not intractable, without status epilepticus: Secondary | ICD-10-CM | POA: Insufficient documentation

## 2011-04-06 DIAGNOSIS — M7500 Adhesive capsulitis of unspecified shoulder: Secondary | ICD-10-CM | POA: Insufficient documentation

## 2011-04-06 DIAGNOSIS — G894 Chronic pain syndrome: Secondary | ICD-10-CM

## 2011-04-06 DIAGNOSIS — G8929 Other chronic pain: Secondary | ICD-10-CM | POA: Insufficient documentation

## 2011-04-06 DIAGNOSIS — M5430 Sciatica, unspecified side: Secondary | ICD-10-CM

## 2011-04-06 DIAGNOSIS — F319 Bipolar disorder, unspecified: Secondary | ICD-10-CM | POA: Insufficient documentation

## 2011-04-06 DIAGNOSIS — M25519 Pain in unspecified shoulder: Secondary | ICD-10-CM | POA: Insufficient documentation

## 2011-04-06 DIAGNOSIS — M25529 Pain in unspecified elbow: Secondary | ICD-10-CM

## 2011-04-07 NOTE — Assessment & Plan Note (Signed)
This patient was seen last month with Dr. Hermelinda Medicus as a new patient for shoulder and low back pain.  The patient obviously has some heavy and serious psychological issues.  She was seen today with her CNA.  "The patient's last UDS was consistent."  Considering the fact that Dr. Thersa Salt had given her a 10-day supply of OxyContin, did follow that today with a full month's supply, however, the patient was somewhat argumentative over pain medicines but she was not sure exactly what pain medicine, the CNA did try to explain it to her as well.  She rates her average pain as 7.  It is a burning, dull aching type pain.  General activity level is 5-9.  Pain is worse in the morning.  Sleep patterns are poor.  Walking, bending, standing and activity aggravate. Medication helps some.  She does use a cane for ambulation.  She can walk about 15 minutes at a time.  She does not climb steps.  She does not drive.  She is on disability.  REVIEW OF SYSTEMS:  Notable for difficulties described above as well as some night sweats, GI issues, coughing, shortness of breath, wheezing, numbness, trouble walking, spasms, depression, confusion and anxiety. No suicidal thoughts.  As I said, UDS was consistent with reported meds.  PAST MEDICAL HISTORY, SOCIAL HISTORY AND FAMILY HISTORY:  Unchanged.  PHYSICAL EXAMINATION:  Her blood pressure is 131/74, pulse 95, respirations are 16, O2 sats 97 on room air.  Upper extremity strength on the left is about 4/5, about a 3/5 on the right.  She has had several scar on her right lateral biceps from a previous surgery.  She is about 4/5 resistance testing in lower extremities.  Sensation appears to be somewhat diminished.  She has a somewhat unstable gait.  ASSESSMENT: 1. Chronic low back pain. 2. History of right humerus fracture. 3. Adhesive capsulitis right shoulder. 4. Bipolar. 5. Seizure disorder.  PLAN:  I went ahead and refilled her OxyContin CR 20 mg 1 p.o. q.8  hours 90 with no refill.  I have tried to answer her questions about mobility. She will follow up with Dr. Riley Kill to discuss her medicines in 1 month.     Costas Sena L. Blima Dessert Electronically Signed    RLW/MedQ D:  04/06/2011 14:05:03  T:  04/07/2011 04:27:44  Job #:  [id number]

## 2011-05-06 ENCOUNTER — Ambulatory Visit: Payer: Medicare Other | Admitting: Physical Medicine & Rehabilitation

## 2011-05-15 ENCOUNTER — Encounter (HOSPITAL_COMMUNITY): Payer: Self-pay | Admitting: *Deleted

## 2011-05-15 ENCOUNTER — Emergency Department (INDEPENDENT_AMBULATORY_CARE_PROVIDER_SITE_OTHER)
Admission: EM | Admit: 2011-05-15 | Discharge: 2011-05-15 | Disposition: A | Discharge status: Home / Self Care | Payer: Medicare Other | Source: Home / Self Care | Attending: Family Medicine | Admitting: Family Medicine

## 2011-05-15 ENCOUNTER — Other Ambulatory Visit: Payer: Self-pay

## 2011-05-15 ENCOUNTER — Emergency Department (INDEPENDENT_AMBULATORY_CARE_PROVIDER_SITE_OTHER): Payer: Medicare Other

## 2011-05-15 DIAGNOSIS — R609 Edema, unspecified: Secondary | ICD-10-CM

## 2011-05-15 DIAGNOSIS — G40909 Epilepsy, unspecified, not intractable, without status epilepticus: Secondary | ICD-10-CM

## 2011-05-15 DIAGNOSIS — R600 Localized edema: Secondary | ICD-10-CM

## 2011-05-15 HISTORY — DX: Epilepsy, unspecified, not intractable, without status epilepticus: G40.909

## 2011-05-15 HISTORY — DX: Atherosclerotic heart disease of native coronary artery without angina pectoris: I25.10

## 2011-05-15 HISTORY — DX: Hypothyroidism, unspecified: E03.9

## 2011-05-15 HISTORY — DX: Pure hypercholesterolemia, unspecified: E78.00

## 2011-05-15 HISTORY — DX: Unspecified convulsions: R56.9

## 2011-05-15 HISTORY — DX: Acute myocardial infarction, unspecified: I21.9

## 2011-05-15 HISTORY — DX: Major depressive disorder, single episode, unspecified: F32.9

## 2011-05-15 HISTORY — DX: Pain in arm, unspecified: M79.603

## 2011-05-15 HISTORY — DX: Unspecified fracture of shaft of humerus, unspecified arm, initial encounter for closed fracture: S42.309A

## 2011-05-15 HISTORY — DX: Depression, unspecified: F32.A

## 2011-05-15 HISTORY — DX: Leukemia, unspecified not having achieved remission: C95.90

## 2011-05-15 HISTORY — DX: Other chronic pain: G89.29

## 2011-05-15 HISTORY — DX: Dystonia, unspecified: G24.9

## 2011-05-15 LAB — POCT I-STAT, CHEM 8
BUN: 10 mg/dL (ref 6–23)
Calcium, Ion: 1.11 mmol/L — ABNORMAL LOW (ref 1.12–1.32)
Chloride: 107 mEq/L (ref 96–112)
Creatinine, Ser: 0.5 mg/dL (ref 0.50–1.10)
Glucose, Bld: 98 mg/dL (ref 70–99)
HCT: 45 % (ref 36.0–46.0)
Potassium: 3.9 mEq/L (ref 3.5–5.1)

## 2011-05-15 NOTE — ED Notes (Signed)
Pt with questionable seizure last night - CNA left at 9pm this am CNA arrived 9am - found pt on floor incontinent of urine - pt with swelling bilateral eye lids - per pt and sitter some swelling face - pt alert and oriented - - right foot pain and leg pain - unable to wear brace due to back pain -

## 2011-05-15 NOTE — ED Provider Notes (Signed)
History     CSN: [id number]  Arrival date & time 05/15/11  1423   First MD Initiated Contact with Patient 05/15/11 1505      Chief Complaint  Patient presents with  . Facial Swelling  . Eye Pain  . Seizures  . Neck Pain  . Arm Pain    (Consider location/radiation/quality/duration/timing/severity/associated sxs/prior treatment) HPI Comments: 61 y/o female smoker with multiple co-morbidities including seizure disorder, dystonia, COPD, chronic pain and mood disorder. Here with care taker complaining of swelling around her eyes. Her care taker left her at home at 9 pm last night this morning at 9 am found her in the floor conscious but urine incontinent. Apparently had a seizure. Pt unsure about what time she had the seizure and does not have an idea for how long she was in the floor. Has had about 6 seizure episodes in the last month which is not usual for her states she has about 2-3 episodes a year. Her neurologist is doctor Love whom she see every 3 months.  Denies head bruising or focal tenderness no skin hematomas abrasions or lacerations. Swelling around eyes is decreasing and no tender and although right eye vision is difficult due to swelling, when she keeps her eye open with the help of her hands; there are not changes in her vision from base line. Appears as she was wearing her eyeglasses and was head down during last seizure, eyeglasses are intact. Also reports not new right foot and leg pain she takes oxycodone for chronic pain. Denies weakness or motor impairment from base line. Denies focal tenderness, able to bear weight in both extremities walks with her cane. She was recently diagnosed with bronchitis and just finished 10 days of antibiotics. Denies chest pain fever or shortness of breath. Also denies burning on urination, nausea or vomiting. Patient and care taker state that patient has been at her base line at home for the last 8 hours before coming here and they are just  concerned about the eye swelling.    Past Medical History  Diagnosis Date  . Seizure   . Epilepsy   . Hypothyroid   . High cholesterol   . Arm pain   . Dystonia   . Depression   . Chronic pain   . Coronary artery disease   . Leukemia   . Myocardial infarct   . Humerus fracture     Past Surgical History  Procedure Date  . Shoulder surgery   . Humerus fracture surgery   . External ear surgery     History reviewed. No pertinent family history.  History  Substance Use Topics  . Smoking status: Current Everyday Smoker -- 3.0 packs/day for 41 years    Types: Cigarettes  . Smokeless tobacco: Not on file  . Alcohol Use: No    OB History    Grav Para Term Preterm Abortions TAB SAB Ect Mult Living                  Review of Systems  Constitutional: Negative for fever and chills.  HENT: Negative for trouble swallowing and neck pain.   Respiratory: Positive for cough. Negative for chest tightness and shortness of breath.   Cardiovascular: Negative for chest pain, palpitations and leg swelling.  Gastrointestinal: Negative for nausea, vomiting, abdominal pain and diarrhea.  Genitourinary: Negative for dysuria, urgency and frequency.  Skin: Negative for rash.       No skin bruising, abrasions or cuts.   Neurological:  Positive for seizures. Negative for dizziness, syncope, speech difficulty, weakness, numbness and headaches.    Allergies  Amoxicillin; Dilaudid; Morphine and related; and Penicillins  Home Medications   Current Outpatient Rx  Name Route Sig Dispense Refill  . ARIPIPRAZOLE 20 MG PO TABS Oral Take 1 tablet (20 mg total) by mouth daily. Per psychiatry 30 tablet 3  . ASPIRIN 81 MG PO TABS Oral Take 160 mg by mouth daily.    Marland Kitchen BENZTROPINE MESYLATE 1 MG PO TABS Oral Take 2 tablets (2 mg total) by mouth 3 (three) times daily.    Marland Kitchen DIPHENHYDRAMINE HCL 50 MG PO TABS Oral Take 50 mg by mouth as directed. 50mg  by mouth qAM, 50mg  by mouth midday, 125 by mouth qHS.     Marland Kitchen FLUTICASONE-SALMETEROL 250-50 MCG/DOSE IN AEPB Inhalation Inhale 1 puff into the lungs 2 (two) times daily. 1 each 11  . GABAPENTIN 800 MG PO TABS  1 tab three times daily and 2 tablets at beditme. 30 tablet 2  . LEVOTHYROXINE SODIUM 112 MCG PO TABS Oral Take 112 mcg by mouth daily.      Marland Kitchen LORAZEPAM 0.5 MG PO TABS Oral Take 1 mg by mouth 3 (three) times daily.     Marland Kitchen NITROFURANTOIN MONOHYD MACRO 100 MG PO CAPS Oral Take 1 capsule (100 mg total) by mouth at bedtime.    . OXYCODONE HCL ER 30 MG PO TB12 Oral Take 30 mg by mouth 3 (three) times daily as needed.    Marland Kitchen PROMETHAZINE HCL 12.5 MG PO TABS Oral Take 12.5 mg by mouth every 6 (six) hours as needed. For nausea     . SIMVASTATIN 80 MG PO TABS Oral Take 0.5 tablets (40 mg total) by mouth at bedtime. 30 tablet 11  . SOLIFENACIN SUCCINATE 5 MG PO TABS Oral Take 5 mg by mouth daily.     Marland Kitchen TEMAZEPAM 30 MG PO CAPS Oral Take 30 mg by mouth at bedtime as needed.    Marland Kitchen VALACYCLOVIR HCL 500 MG PO TABS Oral Take 500 mg by mouth daily.      . VENLAFAXINE HCL 75 MG PO TABS Oral Take 75 mg by mouth 1 day or 1 dose. Takes at 6pm     . VENLAFAXINE HCL ER 150 MG PO CP24 Oral Take 150 mg by mouth 2 (two) times daily. Per psychiatry Morning and noon    . ALBUTEROL 90 MCG/ACT IN AERS Inhalation Inhale 2 puffs into the lungs as directed. 1-2 puffs up to three times a day prn 17 g 5  . METAXALONE 800 MG PO TABS Oral Take 1 tablet (800 mg total) by mouth 3 (three) times daily. 90 tablet 3  . NICOTINE 10 MG IN INHA Inhalation Inhale 1 puff into the lungs as needed for smoking cessation.      BP 111/62  Pulse 98  Temp(Src) 98.9 F (37.2 C) (Oral)  Resp 22  SpO2 96%  Physical Exam  Nursing note and vitals reviewed. Constitutional: She is oriented to person, place, and time. She appears well-developed and well-nourished. No distress.  HENT:  Head: Normocephalic and atraumatic.  Right Ear: External ear normal.  Left Ear: External ear normal.  Nose: Nose  normal.  Mouth/Throat: Oropharynx is clear and moist.  Eyes: Conjunctivae and EOM are normal. Pupils are equal, round, and reactive to light. Right eye exhibits no discharge. Left eye exhibits no discharge.       There is mild to moderate lid edema bilateral worse in  right upper lid. No erythema or blepharitis. No tenderness to palpation around periorbital areas. No hematomas, echymosis or raccoon eyes.   Neck: Neck supple. No JVD present.       Fair range of motion.  Cardiovascular: Normal rate, regular rhythm and intact distal pulses.  Exam reveals no gallop and no friction rub.   No murmur heard. Pulmonary/Chest:       transmitted sound with scattered bilateral rhonchi. No active wheezing. No tachypnea or orthopnea.   Abdominal: Soft. There is no tenderness.  Lymphadenopathy:    She has no cervical adenopathy.  Neurological: She is alert and oriented to person, place, and time. She has normal strength. No cranial nerve deficit or sensory deficit. Coordination normal.    ED Course  Procedures (including critical care time)  Labs Reviewed  POCT I-STAT, CHEM 8 - Abnormal; Notable for the following:    Calcium, Ion 1.11 (*)    Hemoglobin 15.3 (*)    All other components within normal limits  LAB REPORT - SCANNED   Dg Chest 2 View  05/15/2011  *RADIOLOGY REPORT*  Clinical Data: Chest and arm pain.  Abnormal breath sounds on physical exam.  CHEST - 2 VIEW  Comparison: 02/10/2010  Findings: Improved aeration of both lungs is seen.  Both lungs are clear.  No evidence of pleural effusion.  Heart size is within normal limits.  No mass or lymphadenopathy identified.  Right middle lobe and lingular scarring is again noted.  Right shoulder prosthesis again seen.  IMPRESSION: Right middle lobe and lingular scarring.  No active cardiopulmonary disease.  Original Report Authenticated By: Danae Orleans, M.D.     1. Periorbital edema   2. Seizure disorder       MDM  Known seizure disorder.  Clinically well here with stable vital signs. Rhabdomyolysis unlikely with normal creatinine. Impress impending edema of the face possibly from laying face down in the floor. No clinical or radiologic findings consistent with pneumonia.  Patient was discharged home in stable condition reccommended follow up with neurologist to discuss increased seizure activity. Return if any new or worsening symptoms.           Sharin Grave, MD 05/16/11 1345

## 2012-01-19 ENCOUNTER — Other Ambulatory Visit: Payer: Self-pay | Admitting: Internal Medicine

## 2012-01-19 ENCOUNTER — Ambulatory Visit
Admission: RE | Admit: 2012-01-19 | Discharge: 2012-01-19 | Disposition: A | Discharge status: Home / Self Care | Payer: Medicare Other | Source: Ambulatory Visit | Attending: Internal Medicine | Admitting: Internal Medicine

## 2012-01-19 DIAGNOSIS — R05 Cough: Secondary | ICD-10-CM

## 2013-05-21 ENCOUNTER — Other Ambulatory Visit: Payer: Self-pay | Admitting: Internal Medicine

## 2013-05-21 ENCOUNTER — Ambulatory Visit
Admission: RE | Admit: 2013-05-21 | Discharge: 2013-05-21 | Disposition: A | Discharge status: Home / Self Care | Payer: Medicare Other | Source: Ambulatory Visit | Attending: Internal Medicine | Admitting: Internal Medicine

## 2013-05-21 DIAGNOSIS — R05 Cough: Secondary | ICD-10-CM

## 2013-05-21 DIAGNOSIS — R059 Cough, unspecified: Secondary | ICD-10-CM

## 2014-03-21 ENCOUNTER — Encounter (HOSPITAL_COMMUNITY): Payer: Self-pay | Admitting: Emergency Medicine

## 2014-03-21 ENCOUNTER — Other Ambulatory Visit (HOSPITAL_COMMUNITY): Payer: Medicare Other

## 2014-03-21 ENCOUNTER — Emergency Department (HOSPITAL_COMMUNITY): Payer: Medicare Other

## 2014-03-21 ENCOUNTER — Emergency Department (HOSPITAL_COMMUNITY)
Admission: EM | Admit: 2014-03-21 | Discharge: 2014-03-22 | Disposition: A | Discharge status: Home / Self Care | Payer: Medicare Other | Attending: Emergency Medicine | Admitting: Emergency Medicine

## 2014-03-21 DIAGNOSIS — I252 Old myocardial infarction: Secondary | ICD-10-CM | POA: Diagnosis not present

## 2014-03-21 DIAGNOSIS — E780 Pure hypercholesterolemia: Secondary | ICD-10-CM | POA: Diagnosis not present

## 2014-03-21 DIAGNOSIS — G8929 Other chronic pain: Secondary | ICD-10-CM | POA: Diagnosis not present

## 2014-03-21 DIAGNOSIS — I2510 Atherosclerotic heart disease of native coronary artery without angina pectoris: Secondary | ICD-10-CM | POA: Insufficient documentation

## 2014-03-21 DIAGNOSIS — Z792 Long term (current) use of antibiotics: Secondary | ICD-10-CM | POA: Insufficient documentation

## 2014-03-21 DIAGNOSIS — E039 Hypothyroidism, unspecified: Secondary | ICD-10-CM | POA: Insufficient documentation

## 2014-03-21 DIAGNOSIS — M549 Dorsalgia, unspecified: Secondary | ICD-10-CM

## 2014-03-21 DIAGNOSIS — Z720 Tobacco use: Secondary | ICD-10-CM | POA: Insufficient documentation

## 2014-03-21 DIAGNOSIS — Z7982 Long term (current) use of aspirin: Secondary | ICD-10-CM | POA: Insufficient documentation

## 2014-03-21 DIAGNOSIS — J449 Chronic obstructive pulmonary disease, unspecified: Secondary | ICD-10-CM | POA: Insufficient documentation

## 2014-03-21 DIAGNOSIS — M5442 Lumbago with sciatica, left side: Secondary | ICD-10-CM

## 2014-03-21 DIAGNOSIS — F329 Major depressive disorder, single episode, unspecified: Secondary | ICD-10-CM | POA: Diagnosis not present

## 2014-03-21 DIAGNOSIS — Z880 Allergy status to penicillin: Secondary | ICD-10-CM | POA: Insufficient documentation

## 2014-03-21 DIAGNOSIS — Z8781 Personal history of (healed) traumatic fracture: Secondary | ICD-10-CM | POA: Insufficient documentation

## 2014-03-21 HISTORY — DX: Chronic obstructive pulmonary disease, unspecified: J44.9

## 2014-03-21 LAB — COMPREHENSIVE METABOLIC PANEL
ALK PHOS: 83 U/L (ref 39–117)
ALT: 16 U/L (ref 0–35)
AST: 20 U/L (ref 0–37)
Albumin: 3.6 g/dL (ref 3.5–5.2)
Anion gap: 14 (ref 5–15)
BUN: 11 mg/dL (ref 6–23)
CO2: 27 meq/L (ref 19–32)
Calcium: 9.3 mg/dL (ref 8.4–10.5)
Chloride: 101 mEq/L (ref 96–112)
Creatinine, Ser: 0.73 mg/dL (ref 0.50–1.10)
GFR calc non Af Amer: 88 mL/min — ABNORMAL LOW (ref >90)
GLUCOSE: 83 mg/dL (ref 70–99)
POTASSIUM: 4.5 meq/L (ref 3.7–5.3)
SODIUM: 142 meq/L (ref 137–147)
TOTAL PROTEIN: 7.4 g/dL (ref 6.0–8.3)
Total Bilirubin: 0.3 mg/dL (ref 0.3–1.2)

## 2014-03-21 LAB — CBC WITH DIFFERENTIAL/PLATELET
BASOS ABS: 0.1 10*3/uL (ref 0.0–0.1)
Basophils Relative: 1 % (ref 0–1)
EOS ABS: 0.4 10*3/uL (ref 0.0–0.7)
Eosinophils Relative: 4 % (ref 0–5)
HCT: 42.2 % (ref 36.0–46.0)
Hemoglobin: 12.8 g/dL (ref 12.0–15.0)
LYMPHS ABS: 2.6 10*3/uL (ref 0.7–4.0)
LYMPHS PCT: 26 % (ref 12–46)
MCH: 29.4 pg (ref 26.0–34.0)
MCHC: 30.3 g/dL (ref 30.0–36.0)
MCV: 97 fL (ref 78.0–100.0)
Monocytes Absolute: 0.8 10*3/uL (ref 0.1–1.0)
Monocytes Relative: 8 % (ref 3–12)
NEUTROS PCT: 61 % (ref 43–77)
Neutro Abs: 6.2 10*3/uL (ref 1.7–7.7)
PLATELETS: 270 10*3/uL (ref 150–400)
RBC: 4.35 MIL/uL (ref 3.87–5.11)
RDW: 14 % (ref 11.5–15.5)
WBC: 10 10*3/uL (ref 4.0–10.5)

## 2014-03-21 MED ORDER — DIAZEPAM 5 MG/ML IJ SOLN
5.0000 mg | Freq: Once | INTRAMUSCULAR | Status: AC
  Administered 2014-03-21: 5 mg via INTRAVENOUS
  Filled 2014-03-21: qty 2

## 2014-03-21 MED ORDER — OXYCODONE HCL 5 MG PO TABS
10.0000 mg | ORAL_TABLET | Freq: Once | ORAL | Status: AC
  Administered 2014-03-21: 10 mg via ORAL
  Filled 2014-03-21: qty 2

## 2014-03-21 NOTE — ED Provider Notes (Signed)
CSN: [id number]     Arrival date & time 03/21/14  1650 History   First MD Initiated Contact with Patient 03/21/14 1836     Chief Complaint  Patient presents with  . Back Pain     (Consider location/radiation/quality/duration/timing/severity/associated sxs/prior Treatment) Patient is a 63 y.o. female presenting with back pain.  Back Pain Location:  Lumbar spine Quality:  Shooting Radiates to:  L posterior upper leg and L knee Pain severity:  Severe Onset quality:  Gradual Duration:  3 days Timing:  Constant Progression:  Worsening Chronicity:  Chronic Context comment:  Pt reportedly had a seizure yesterday, denies falling because she recognized her aura. Relieved by:  Nothing Worsened by:  Coughing, movement and standing Associated symptoms: bladder incontinence ("only if you make me laugh" described as chronic.)   Associated symptoms: no bowel incontinence, no chest pain, no fever, no numbness, no paresthesias, no perianal numbness, no tingling and no weakness     Past Medical History  Diagnosis Date  . Seizure   . Epilepsy   . Hypothyroid   . High cholesterol   . Arm pain   . Dystonia   . Depression   . Chronic pain   . Coronary artery disease   . Leukemia   . Myocardial infarct   . Humerus fracture   . COPD (chronic obstructive pulmonary disease)    Past Surgical History  Procedure Laterality Date  . Shoulder surgery    . Humerus fracture surgery    . External ear surgery     No family history on file. History  Substance Use Topics  . Smoking status: Current Every Day Smoker -- 3.00 packs/day for 41 years    Types: Cigarettes  . Smokeless tobacco: Not on file  . Alcohol Use: No   OB History    No data available     Review of Systems  Constitutional: Negative for fever.  Cardiovascular: Negative for chest pain.  Gastrointestinal: Negative for bowel incontinence.  Genitourinary: Positive for bladder incontinence ("only if you make me laugh" described  as chronic.).  Musculoskeletal: Positive for back pain.  Neurological: Negative for tingling, weakness, numbness and paresthesias.  All other systems reviewed and are negative.     Allergies  Amoxicillin; Penicillins; Dilaudid; and Morphine and related  Home Medications   Prior to Admission medications   Medication Sig Start Date End Date Taking? Authorizing Provider  acetaminophen-codeine (TYLENOL #3) 300-30 MG per tablet Take 1 tablet by mouth 2 (two) times daily as needed for moderate pain.  02/20/14  Yes Historical Provider, MD  acyclovir (ZOVIRAX) 200 MG capsule Take 1 capsule by mouth 3 (three) times daily. 03/10/14  Yes Historical Provider, MD  albuterol (PROVENTIL,VENTOLIN) 90 MCG/ACT inhaler Inhale 2 puffs into the lungs as directed. 1-2 puffs up to three times a day prn Patient taking differently: Inhale 1-2 puffs into the lungs 3 (three) times daily as needed for wheezing or shortness of breath. 1-2 puffs up to three times a day prn 11/22/10  Yes Todd D McDiarmid, MD  ARIPiprazole (ABILIFY) 30 MG tablet Take 1 tablet by mouth daily. 03/10/14  Yes Historical Provider, MD  aspirin 81 MG tablet Take 81 mg by mouth daily.    Yes Historical Provider, MD  benztropine (COGENTIN) 2 MG tablet Take 1 tablet by mouth 3 (three) times daily. 01/06/14  Yes Historical Provider, MD  ciprofloxacin (CIPRO) 250 MG tablet Take 1 tablet by mouth 2 (two) times daily. 10 day course. Pt started taking  03/20/14 03/12/14  Yes Historical Provider, MD  diphenhydrAMINE (BENADRYL) 50 MG tablet Take 50 mg by mouth 2 (two) times daily.    Yes Historical Provider, MD  gabapentin (NEURONTIN) 800 MG tablet Take 800-1,600 mg by mouth 4 (four) times daily. 1 tab three times daily and 2 tablets at beditme. 10/12/10  Yes Todd D McDiarmid, MD  levothyroxine (SYNTHROID, LEVOTHROID) 112 MCG tablet Take 112 mcg by mouth daily.     Yes Historical Provider, MD  LORazepam (ATIVAN) 1 MG tablet Take 1 tablet by mouth 3 (three)  times daily as needed for anxiety.  03/12/14  Yes Historical Provider, MD  Oxycodone HCl 10 MG TABS Take 2 tablets by mouth 4 (four) times daily. 03/12/14  Yes Historical Provider, MD  OXYCONTIN 20 MG T12A 12 hr tablet Take 1 tablet by mouth 2 (two) times daily. 02/11/14  Yes Historical Provider, MD  promethazine (PHENERGAN) 12.5 MG tablet Take 12.5 mg by mouth every 6 (six) hours as needed for nausea. For nausea   Yes Historical Provider, MD  simvastatin (ZOCOR) 80 MG tablet Take 0.5 tablets (40 mg total) by mouth at bedtime. Patient taking differently: Take 80 mg by mouth at bedtime.  02/16/11  Yes Lyndee Hensen, MD  temazepam (RESTORIL) 30 MG capsule Take 30 mg by mouth at bedtime as needed for sleep.    Yes Historical Provider, MD  venlafaxine (EFFEXOR-XR) 150 MG 24 hr capsule Take 150 mg by mouth 2 (two) times daily. Pt takes 150mg  capsule in the morning and at noon, then takes 75mg  capsule at bedtime.   Yes Historical Provider, MD  venlafaxine XR (EFFEXOR-XR) 75 MG 24 hr capsule Take 1 capsule by mouth every evening. Pt takes 150mg  capsule in the morning and at noon, then takes 75mg  capsule at bedtime. 03/10/14  Yes Historical Provider, MD  ARIPiprazole (ABILIFY) 20 MG tablet Take 1 tablet (20 mg total) by mouth daily. Per psychiatry Patient not taking: Reported on 03/21/2014 01/20/11   Lyndee Hensen, MD  Fluticasone-Salmeterol (ADVAIR DISKUS) 250-50 MCG/DOSE AEPB Inhale 1 puff into the lungs 2 (two) times daily. Patient not taking: Reported on 03/21/2014 09/02/10 09/02/11  Silverio Decamp, MD  metaxalone (SKELAXIN) 800 MG tablet Take 1 tablet (800 mg total) by mouth 3 (three) times daily. Patient not taking: Reported on 03/21/2014 02/18/11   Lyndee Hensen, MD   BP 102/53 mmHg  Pulse 81  Temp(Src) 99.1 F (37.3 C) (Oral)  Resp 15  SpO2 95% Physical Exam  Constitutional: She is oriented to person, place, and time. She appears well-developed and well-nourished. No distress.  HENT:    Head: Normocephalic and atraumatic.  Eyes: Conjunctivae are normal. No scleral icterus.  Neck: Neck supple.  Cardiovascular: Normal rate and intact distal pulses.   Pulmonary/Chest: Effort normal. No stridor. No respiratory distress.  Abdominal: Normal appearance. She exhibits no distension.  Musculoskeletal:       Back:  Neurological: She is alert and oriented to person, place, and time.  Reflex Scores:      Patellar reflexes are 2+ on the right side and 4+ on the left side.      Achilles reflexes are 4+ on the left side. Skin: Skin is warm and dry. No rash noted.  Psychiatric: She has a normal mood and affect. Her behavior is normal.  Nursing note and vitals reviewed.   ED Course  Procedures (including critical care time) Labs Review Labs Reviewed  COMPREHENSIVE METABOLIC PANEL - Abnormal; Notable for the following:  GFR calc non Af Amer 88 (*)    All other components within normal limits  CBC WITH DIFFERENTIAL    Imaging Review Mr Attempted Study-no Report  03/21/2014   This examination belongs to an outside facility and is stored  here for comparison purposes only.  Contact the originating outside  institution for any associated report or interpretation.    EKG Interpretation None      MDM   Final diagnoses:  Back pain  Left-sided low back pain with left-sided sciatica    63 year old female with a history of schizophrenia and dystonia who presents with left low back pain. Symptoms present for the past several days. On exam, has severe tenderness of her left paraspinal lumbar musculature radiating into her gluteus. She has no strength or sensation deficits. However, she is hyperreflexive, seemingly worse on the left. She is unable to state whether this is baseline for her. I attempted to get imaging studies, but patient was unable to tolerate these secondary to pain and spasms. I consulted neurology and Dr. Leonel Ramsay evaluated the patient in the emergency room.  He did not feel that we needed to pursue MR imaging tonight.  He recommended outpatient follow-up. Plan treat with muscle relaxers and low-dose NSAIDs.    Artis Delay, MD 03/22/14 [id number]

## 2014-03-21 NOTE — ED Notes (Signed)
Patient taken to MRI

## 2014-03-21 NOTE — ED Notes (Signed)
Patient states she is ready to go home.   

## 2014-03-21 NOTE — ED Notes (Signed)
Pt comes from home via EMS for buttock pain that radiates done her leg. Pt told EMS that pain started 3 weeks ago then changed it to 5 days.    Pt told EMS that she had a seizure yesterday and today she woke up on the floor.

## 2014-03-22 DIAGNOSIS — M545 Low back pain: Secondary | ICD-10-CM

## 2014-03-22 MED ORDER — NAPROXEN SODIUM 220 MG PO TABS: 220.0000 mg | ORAL_TABLET | Freq: Two times a day (BID) | ORAL | Status: AC

## 2014-03-22 MED ORDER — METAXALONE 800 MG PO TABS: 800.0000 mg | ORAL_TABLET | Freq: Three times a day (TID) | ORAL | Status: DC | PRN

## 2014-03-22 NOTE — ED Notes (Signed)
Patient is upset because she said she came to the hospital with a wheelchair. Patient came by [facility]. Patient did not come with a wheelchair.

## 2014-03-22 NOTE — ED Notes (Signed)
Pt able to ambulate with cane assistance.

## 2014-03-22 NOTE — Discharge Instructions (Signed)
Back Pain, Adult Low back pain is very common. About 1 in 5 people have back pain.The cause of low back pain is rarely dangerous. The pain often gets better over time.About half of people with a sudden onset of back pain feel better in just 2 weeks. About 8 in 10 people feel better by 6 weeks.  CAUSES Some common causes of back pain include:  Strain of the muscles or ligaments supporting the spine.  Wear and tear (degeneration) of the spinal discs.  Arthritis.  Direct injury to the back. DIAGNOSIS Most of the time, the direct cause of low back pain is not known.However, back pain can be treated effectively even when the exact cause of the pain is unknown.Answering your caregiver's questions about your overall health and symptoms is one of the most accurate ways to make sure the cause of your pain is not dangerous. If your caregiver needs more information, he or she may order lab work or imaging tests (X-rays or MRIs).However, even if imaging tests show changes in your back, this usually does not require surgery. HOME CARE INSTRUCTIONS For many people, back pain returns.Since low back pain is rarely dangerous, it is often a condition that people can learn to manageon their own.   Remain active. It is stressful on the back to sit or stand in one place. Do not sit, drive, or stand in one place for more than 30 minutes at a time. Take short walks on level surfaces as soon as pain allows.Try to increase the length of time you walk each day.  Do not stay in bed.Resting more than 1 or 2 days can delay your recovery.  Do not avoid exercise or work.Your body is made to move.It is not dangerous to be active, even though your back may hurt.Your back will likely heal faster if you return to being active before your pain is gone.  Pay attention to your body when you bend and lift. Many people have less discomfortwhen lifting if they bend their knees, keep the load close to their bodies,and  avoid twisting. Often, the most comfortable positions are those that put less stress on your recovering back.  Find a comfortable position to sleep. Use a firm mattress and lie on your side with your knees slightly bent. If you lie on your back, put a pillow under your knees.  Only take over-the-counter or prescription medicines as directed by your caregiver. Over-the-counter medicines to reduce pain and inflammation are often the most helpful.Your caregiver may prescribe muscle relaxant drugs.These medicines help dull your pain so you can more quickly return to your normal activities and healthy exercise.  Put ice on the injured area.  Put ice in a plastic bag.  Place a towel between your skin and the bag.  Leave the ice on for 15-20 minutes, 03-04 times a day for the first 2 to 3 days. After that, ice and heat may be alternated to reduce pain and spasms.  Ask your caregiver about trying back exercises and gentle massage. This may be of some benefit.  Avoid feeling anxious or stressed.Stress increases muscle tension and can worsen back pain.It is important to recognize when you are anxious or stressed and learn ways to manage it.Exercise is a great option. SEEK MEDICAL CARE IF:  You have pain that is not relieved with rest or medicine.  You have pain that does not improve in 1 week.  You have new symptoms.  You are generally not feeling well. SEEK   IMMEDIATE MEDICAL CARE IF:   You have pain that radiates from your back into your legs.  You develop new bowel or bladder control problems.  You have unusual weakness or numbness in your arms or legs.  You develop nausea or vomiting.  You develop abdominal pain.  You feel faint. Document Released: 03/28/2005 Document Revised: 09/27/2011 Document Reviewed: 07/30/2013 [employer] Patient Information 2015 [employer]. This information is not intended to replace advice given to you by your health care provider. Make sure you  discuss any questions you have with your health care provider.  

## 2014-03-22 NOTE — ED Notes (Signed)
Patient came back from MRI.

## 2014-03-22 NOTE — Consult Note (Addendum)
Neurology Consultation Reason for Consult: Back pain Referring Physician: Doy Mince, T  CC: Back pain  History is obtained from: Patient  HPI: Dominique Acosta is a 63 y.o. female who states that she was diagnosed by Dr. love with epilepsy as well as paroxysmal dystonia. She is currently having back pain radiating down into her left leg which she says is consistent with her history of dystonia. She states that these pains can come on at any time and can be anywhere but they feel similar to what she is experiencing. This event has been going on for several days.  She is tangential when answering questions, and it is difficult to get obtain a history of what has worked for her in the past. Currently she has tried gabapentin as well as analgesia.  She states that any movement makes it much worse but she has been coping at home.    ROS: A 14 point ROS was performed and is negative except as noted in the HPI.   Past Medical History  Diagnosis Date  . Seizure   . Epilepsy   . Hypothyroid   . High cholesterol   . Arm pain   . Dystonia   . Depression   . Chronic pain   . Coronary artery disease   . Leukemia   . Myocardial infarct   . Humerus fracture   . COPD (chronic obstructive pulmonary disease)     Family History: No history of dystonia  Social History: Tob: Current smoker  Exam: Current vital signs: BP 112/54 mmHg  Pulse 78  Temp(Src) 98.9 F (37.2 C) (Oral)  Resp 14  SpO2 92% Vital signs in last 24 hours: Temp:  [98.9 F (37.2 C)-99.1 F (37.3 C)] 98.9 F (37.2 C) (12/12 0010) Pulse Rate:  [76-81] 78 (12/12 0010) Resp:  [14-17] 14 (12/12 0010) BP: (102-122)/(49-54) 112/54 mmHg (12/12 0010) SpO2:  [88 %-100 %] 92 % (12/12 0010) Weight:  [74.844 kg (165 lb)] 74.844 kg (165 lb) (12/11 2141)  General: In bed   Physical Exam  Constitutional: Appears well-developed and well-nourished.  Psych: Affect appropriate, but tangential responses at times. Eyes: No scleral  injection HENT: No OP obstrucion Head: Normocephalic.  Cardiovascular: Normal rate and regular rhythm.  Respiratory: Effort normal and breath sounds normal to anterior ascultation GI: Soft.  No distension. There is no tenderness.  Skin: WDI  Neuro: Mental Status: Patient is awake, alert, oriented to person, place, month, year, and situation. Patient is able to give a clear and coherent history. No signs of aphasia or neglect Cranial Nerves: II: Visual Fields are full. Pupils are equal, round, and reactive to light.   III,IV, VI: EOMI without ptosis or diploplia.  V: Facial sensation is symmetric to temperature VII: Facial movement is symmetric.  VIII: hearing is intact to voice X: Uvula elevates symmetrically XI: Shoulder shrug is symmetric. XII: tongue is midline without atrophy or fasciculations.  Motor: Tone is normal. Bulk is normal. 5/5 strength was present in all four extremities.  Sensory: Sensation is symmetric to light touch and temperature in the arms and legs. Deep Tendon Reflexes: 3+ and symmetric in the biceps and patellae.  Plantars: Toes are downgoing bilaterally.  Cerebellar: FNF intact bilaterally  Her back was tender to palpation to the left of the midline and she experiences a subjective spasm after I palpate in the area.   I have reviewed labs in epic and the results pertinent to this consultation are: CMP-unremarkable  Impression: 63 year old female with new  back pain of several days duration. It is possible that this is related to some paroxysmal dystonic disorder, but difficult for me to say this with certainty given that I do not have access to records. She was not able to tolerate an MRI. Given that she has no deficits in strength or sensation, even if she did have a bulging disc, I think that conservative therapy would be indicated as opposed to surgery in any case.  I would be hesitant to pursue steroids given her psychiatric  history.  Recommendations: 1) Skelaxin 2) could increase her Ativan to taking 3 times per day (this is what her prescription is for but she takes less often most times.) 3) follow up with outpatient neurology. 4) analgesia 5) also with hyperreflexia, I advised her that if she has any worsening of her gait or weakness to pursue a C-spine MRI.  Roland Rack, MD [facility] [phone]  If 7pm- 7am, please page neurology on call as listed in [place].

## 2014-03-25 ENCOUNTER — Emergency Department (HOSPITAL_COMMUNITY)
Admission: EM | Admit: 2014-03-25 | Discharge: 2014-03-25 | Disposition: A | Discharge status: Home / Self Care | Payer: Medicare Other | Attending: Emergency Medicine | Admitting: Emergency Medicine

## 2014-03-25 ENCOUNTER — Emergency Department (HOSPITAL_COMMUNITY): Payer: Medicare Other

## 2014-03-25 ENCOUNTER — Encounter (HOSPITAL_COMMUNITY): Payer: Self-pay | Admitting: Emergency Medicine

## 2014-03-25 DIAGNOSIS — F1310 Sedative, hypnotic or anxiolytic abuse, uncomplicated: Secondary | ICD-10-CM | POA: Diagnosis not present

## 2014-03-25 DIAGNOSIS — E780 Pure hypercholesterolemia: Secondary | ICD-10-CM | POA: Insufficient documentation

## 2014-03-25 DIAGNOSIS — G43909 Migraine, unspecified, not intractable, without status migrainosus: Secondary | ICD-10-CM | POA: Insufficient documentation

## 2014-03-25 DIAGNOSIS — Z7982 Long term (current) use of aspirin: Secondary | ICD-10-CM | POA: Insufficient documentation

## 2014-03-25 DIAGNOSIS — J449 Chronic obstructive pulmonary disease, unspecified: Secondary | ICD-10-CM | POA: Insufficient documentation

## 2014-03-25 DIAGNOSIS — F1110 Opioid abuse, uncomplicated: Secondary | ICD-10-CM | POA: Diagnosis not present

## 2014-03-25 DIAGNOSIS — G8929 Other chronic pain: Secondary | ICD-10-CM | POA: Diagnosis not present

## 2014-03-25 DIAGNOSIS — Z856 Personal history of leukemia: Secondary | ICD-10-CM | POA: Insufficient documentation

## 2014-03-25 DIAGNOSIS — N390 Urinary tract infection, site not specified: Secondary | ICD-10-CM | POA: Diagnosis not present

## 2014-03-25 DIAGNOSIS — Z720 Tobacco use: Secondary | ICD-10-CM | POA: Diagnosis not present

## 2014-03-25 DIAGNOSIS — R4182 Altered mental status, unspecified: Secondary | ICD-10-CM | POA: Diagnosis present

## 2014-03-25 DIAGNOSIS — R410 Disorientation, unspecified: Secondary | ICD-10-CM

## 2014-03-25 DIAGNOSIS — I2510 Atherosclerotic heart disease of native coronary artery without angina pectoris: Secondary | ICD-10-CM | POA: Insufficient documentation

## 2014-03-25 DIAGNOSIS — Z880 Allergy status to penicillin: Secondary | ICD-10-CM | POA: Insufficient documentation

## 2014-03-25 DIAGNOSIS — F329 Major depressive disorder, single episode, unspecified: Secondary | ICD-10-CM | POA: Diagnosis not present

## 2014-03-25 DIAGNOSIS — Z79899 Other long term (current) drug therapy: Secondary | ICD-10-CM | POA: Diagnosis not present

## 2014-03-25 DIAGNOSIS — E039 Hypothyroidism, unspecified: Secondary | ICD-10-CM | POA: Insufficient documentation

## 2014-03-25 DIAGNOSIS — Z7951 Long term (current) use of inhaled steroids: Secondary | ICD-10-CM | POA: Diagnosis not present

## 2014-03-25 DIAGNOSIS — Z8781 Personal history of (healed) traumatic fracture: Secondary | ICD-10-CM | POA: Diagnosis not present

## 2014-03-25 DIAGNOSIS — I252 Old myocardial infarction: Secondary | ICD-10-CM | POA: Diagnosis not present

## 2014-03-25 LAB — RAPID URINE DRUG SCREEN, HOSP PERFORMED
Amphetamines: NOT DETECTED
BARBITURATES: NOT DETECTED
Benzodiazepines: POSITIVE — AB
Cocaine: NOT DETECTED
Opiates: POSITIVE — AB
Tetrahydrocannabinol: NOT DETECTED

## 2014-03-25 LAB — I-STAT ARTERIAL BLOOD GAS, ED
Bicarbonate: 26.9 mEq/L — ABNORMAL HIGH (ref 20.0–24.0)
O2 SAT: 84 %
PH ART: 7.31 — AB (ref 7.350–7.450)
PO2 ART: 54 mmHg — AB (ref 80.0–100.0)
TCO2: 28 mmol/L (ref 0–100)
pCO2 arterial: 53.4 mmHg — ABNORMAL HIGH (ref 35.0–45.0)

## 2014-03-25 LAB — I-STAT CHEM 8, ED
BUN: 20 mg/dL (ref 6–23)
CHLORIDE: 106 meq/L (ref 96–112)
CREATININE: 0.6 mg/dL (ref 0.50–1.10)
Calcium, Ion: 0.93 mmol/L — ABNORMAL LOW (ref 1.13–1.30)
Glucose, Bld: 94 mg/dL (ref 70–99)
HCT: 44 % (ref 36.0–46.0)
Hemoglobin: 15 g/dL (ref 12.0–15.0)
POTASSIUM: 6.3 meq/L — AB (ref 3.7–5.3)
SODIUM: 136 meq/L — AB (ref 137–147)
TCO2: 25 mmol/L (ref 0–100)

## 2014-03-25 LAB — URINALYSIS, ROUTINE W REFLEX MICROSCOPIC
Glucose, UA: NEGATIVE mg/dL
Hgb urine dipstick: NEGATIVE
KETONES UR: NEGATIVE mg/dL
NITRITE: NEGATIVE
PROTEIN: NEGATIVE mg/dL
Specific Gravity, Urine: 1.036 — ABNORMAL HIGH (ref 1.005–1.030)
UROBILINOGEN UA: 1 mg/dL (ref 0.0–1.0)
pH: 5.5 (ref 5.0–8.0)

## 2014-03-25 LAB — URINE MICROSCOPIC-ADD ON

## 2014-03-25 MED ORDER — ALBUTEROL SULFATE (2.5 MG/3ML) 0.083% IN NEBU
5.0000 mg | INHALATION_SOLUTION | Freq: Once | RESPIRATORY_TRACT | Status: AC
  Administered 2014-03-25: 5 mg via RESPIRATORY_TRACT
  Filled 2014-03-25: qty 6

## 2014-03-25 MED ORDER — IPRATROPIUM BROMIDE 0.02 % IN SOLN
0.5000 mg | Freq: Once | RESPIRATORY_TRACT | Status: AC
  Administered 2014-03-25: 0.5 mg via RESPIRATORY_TRACT
  Filled 2014-03-25: qty 2.5

## 2014-03-25 MED ORDER — SODIUM CHLORIDE 0.9 % IV SOLN: Freq: Once | INTRAVENOUS | Status: DC

## 2014-03-25 MED ORDER — NALOXONE HCL 1 MG/ML IJ SOLN
2.0000 mg | Freq: Once | INTRAMUSCULAR | Status: AC
  Administered 2014-03-25: 2 mg via SUBCUTANEOUS
  Filled 2014-03-25 (×2): qty 2

## 2014-03-25 MED ORDER — CIPROFLOXACIN HCL 500 MG PO TABS: 500.0000 mg | ORAL_TABLET | Freq: Two times a day (BID) | ORAL | Status: DC

## 2014-03-25 NOTE — Discharge Instructions (Signed)
Follow up with your family md this week °

## 2014-03-25 NOTE — ED Notes (Signed)
Per ems, pt came from MD's office after going for normal physical; pt became altered at office with hallucinations, confabulation and unable to stand without assist; pt recently admitted to [facility] for back pain--only new med noted on list was flexeril; BS 81; no weakness; slurred speech noted and pt is lethargic

## 2014-03-25 NOTE — ED Notes (Signed)
[facility] contacted to tx patient back home 

## 2014-03-25 NOTE — ED Notes (Signed)
Pt left via [facility] with dc papers, underwear, hair tie, cane, purse with wallet and glasses. These are the same belongings pt came to facility with according to staff.

## 2014-03-25 NOTE — ED Provider Notes (Signed)
CSN: [id number]     Arrival date & time 03/25/14  1250 History   First MD Initiated Contact with Patient 03/25/14 1253     Chief Complaint  Patient presents with  . Altered Mental Status     (Consider location/radiation/quality/duration/timing/severity/associated sxs/prior Treatment) Patient is a 63 y.o. female presenting with altered mental status. The history is provided by the EMS personnel (pt sent here from office with lethargy and confusion).  Altered Mental Status Presenting symptoms: confusion and disorientation   Severity:  Moderate Most recent episode:  Today Episode history:  Single Timing:  Constant Progression:  Waxing and waning Chronicity:  New Context: drug use   Context: not alcohol use   Associated symptoms: no abdominal pain, no hallucinations, no headaches, no rash and no seizures     Past Medical History  Diagnosis Date  . Seizure   . Epilepsy   . Hypothyroid   . High cholesterol   . Arm pain   . Dystonia   . Depression   . Chronic pain   . Coronary artery disease   . Leukemia   . Myocardial infarct   . Humerus fracture   . COPD (chronic obstructive pulmonary disease)    Past Surgical History  Procedure Laterality Date  . Shoulder surgery    . Humerus fracture surgery    . External ear surgery     History reviewed. No pertinent family history. History  Substance Use Topics  . Smoking status: Current Every Day Smoker -- 3.00 packs/day for 41 years    Types: Cigarettes  . Smokeless tobacco: Not on file  . Alcohol Use: No   OB History    No data available     Review of Systems  Constitutional: Negative for appetite change and fatigue.  HENT: Negative for congestion, ear discharge and sinus pressure.   Eyes: Negative for discharge.  Respiratory: Negative for cough.   Cardiovascular: Negative for chest pain.  Gastrointestinal: Negative for abdominal pain and diarrhea.  Genitourinary: Negative for frequency and hematuria.   Musculoskeletal: Negative for back pain.  Skin: Negative for rash.  Neurological: Negative for seizures and headaches.  Psychiatric/Behavioral: Positive for confusion. Negative for hallucinations.      Allergies  Amoxicillin; Penicillins; Dilaudid; and Morphine and related  Home Medications   Prior to Admission medications   Medication Sig Start Date End Date Taking? Authorizing Provider  acetaminophen-codeine (TYLENOL #3) 300-30 MG per tablet Take 1 tablet by mouth 2 (two) times daily as needed for moderate pain.  02/20/14   Historical Provider, MD  acyclovir (ZOVIRAX) 200 MG capsule Take 1 capsule by mouth 3 (three) times daily. 03/10/14   Historical Provider, MD  albuterol (PROVENTIL,VENTOLIN) 90 MCG/ACT inhaler Inhale 2 puffs into the lungs as directed. 1-2 puffs up to three times a day prn Patient taking differently: Inhale 1-2 puffs into the lungs 3 (three) times daily as needed for wheezing or shortness of breath. 1-2 puffs up to three times a day prn 11/22/10   Blane Ohara McDiarmid, MD  ARIPiprazole (ABILIFY) 20 MG tablet Take 1 tablet (20 mg total) by mouth daily. Per psychiatry Patient not taking: Reported on 03/21/2014 01/20/11   Lyndee Hensen, MD  ARIPiprazole (ABILIFY) 30 MG tablet Take 1 tablet by mouth daily. 03/10/14   Historical Provider, MD  aspirin 81 MG tablet Take 81 mg by mouth daily.     Historical Provider, MD  benztropine (COGENTIN) 2 MG tablet Take 1 tablet by mouth 3 (three) times daily. 01/06/14  Historical Provider, MD  ciprofloxacin (CIPRO) 500 MG tablet Take 1 tablet (500 mg total) by mouth 2 (two) times daily. 03/25/14   Maudry Diego, MD  diphenhydrAMINE (BENADRYL) 50 MG tablet Take 50 mg by mouth 2 (two) times daily.     Historical Provider, MD  Fluticasone-Salmeterol (ADVAIR DISKUS) 250-50 MCG/DOSE AEPB Inhale 1 puff into the lungs 2 (two) times daily. Patient not taking: Reported on 03/21/2014 09/02/10 09/02/11  Silverio Decamp, MD  gabapentin  (NEURONTIN) 800 MG tablet Take 800-1,600 mg by mouth 4 (four) times daily. 1 tab three times daily and 2 tablets at beditme. 10/12/10   Blane Ohara McDiarmid, MD  levothyroxine (SYNTHROID, LEVOTHROID) 112 MCG tablet Take 112 mcg by mouth daily.      Historical Provider, MD  LORazepam (ATIVAN) 1 MG tablet Take 1 tablet by mouth 3 (three) times daily as needed for anxiety.  03/12/14   Historical Provider, MD  metaxalone (SKELAXIN) 800 MG tablet Take 1 tablet (800 mg total) by mouth 3 (three) times daily as needed for muscle spasms. 03/22/14   Artis Delay, MD  naproxen sodium (ANAPROX) 220 MG tablet Take 1 tablet (220 mg total) by mouth 2 (two) times daily with a meal. 03/22/14 03/25/14  Artis Delay, MD  Oxycodone HCl 10 MG TABS Take 2 tablets by mouth 4 (four) times daily. 03/12/14   Historical Provider, MD  OXYCONTIN 20 MG T12A 12 hr tablet Take 1 tablet by mouth 2 (two) times daily. 02/11/14   Historical Provider, MD  promethazine (PHENERGAN) 12.5 MG tablet Take 12.5 mg by mouth every 6 (six) hours as needed for nausea. For nausea    Historical Provider, MD  simvastatin (ZOCOR) 80 MG tablet Take 0.5 tablets (40 mg total) by mouth at bedtime. Patient taking differently: Take 80 mg by mouth at bedtime.  02/16/11   Lyndee Hensen, MD  temazepam (RESTORIL) 30 MG capsule Take 30 mg by mouth at bedtime as needed for sleep.     Historical Provider, MD  venlafaxine (EFFEXOR-XR) 150 MG 24 hr capsule Take 150 mg by mouth 2 (two) times daily. Pt takes 150mg  capsule in the morning and at noon, then takes 75mg  capsule at bedtime.    Historical Provider, MD  venlafaxine XR (EFFEXOR-XR) 75 MG 24 hr capsule Take 1 capsule by mouth every evening. Pt takes 150mg  capsule in the morning and at noon, then takes 75mg  capsule at bedtime. 03/10/14   Historical Provider, MD   BP 103/49 mmHg  Pulse 102  Temp(Src) 99.2 F (37.3 C) (Oral)  Resp 20  Ht 5\' 1"  (1.549 m)  Wt 165 lb (74.844 kg)  BMI 31.19 kg/m2  SpO2 82% Physical Exam   Constitutional: She appears well-developed.  HENT:  Head: Normocephalic.  Eyes: Conjunctivae and EOM are normal. No scleral icterus.  Neck: Neck supple. No thyromegaly present.  Cardiovascular: Normal rate and regular rhythm.  Exam reveals no gallop and no friction rub.   No murmur heard. Pulmonary/Chest: No stridor. She has no wheezes. She has no rales. She exhibits no tenderness.  Abdominal: She exhibits no distension. There is no tenderness. There is no rebound.  Musculoskeletal: Normal range of motion. She exhibits no edema.  Lymphadenopathy:    She has no cervical adenopathy.  Neurological: She exhibits normal muscle tone. Coordination normal.  Initially pt confused,  But oriented to person and place.   Pt a little ataxic but walked with a cane  Skin: No rash noted. No erythema.  Psychiatric: She  has a normal mood and affect. Her behavior is normal.    ED Course  Procedures (including critical care time) Labs Review Labs Reviewed  URINALYSIS, ROUTINE W REFLEX MICROSCOPIC - Abnormal; Notable for the following:    Color, Urine AMBER (*)    APPearance CLOUDY (*)    Specific Gravity, Urine 1.036 (*)    Bilirubin Urine SMALL (*)    Leukocytes, UA MODERATE (*)    All other components within normal limits  URINE RAPID DRUG SCREEN (HOSP PERFORMED) - Abnormal; Notable for the following:    Opiates POSITIVE (*)    Benzodiazepines POSITIVE (*)    All other components within normal limits  URINE MICROSCOPIC-ADD ON - Abnormal; Notable for the following:    Squamous Epithelial / LPF MANY (*)    Bacteria, UA FEW (*)    All other components within normal limits  I-STAT CHEM 8, ED - Abnormal; Notable for the following:    Sodium 136 (*)    Potassium 6.3 (*)    Calcium, Ion 0.93 (*)    All other components within normal limits  I-STAT ARTERIAL BLOOD GAS, ED - Abnormal; Notable for the following:    pH, Arterial 7.310 (*)    pCO2 arterial 53.4 (*)    pO2, Arterial 54.0 (*)     Bicarbonate 26.9 (*)    All other components within normal limits  BLOOD GAS, ARTERIAL    Imaging Review Ct Head Wo Contrast  03/25/2014   CLINICAL DATA:  Confusion  EXAM: CT HEAD WITHOUT CONTRAST  TECHNIQUE: Contiguous axial images were obtained from the base of the skull through the vertex without intravenous contrast.  COMPARISON:  02/10/2010  FINDINGS: Bony calvarium is intact. No gross soft tissue abnormality is noted. Atrophic changes are seen. No findings to suggest acute hemorrhage, acute infarction or space-occupying mass lesion are seen. No other focal abnormality is noted.  IMPRESSION: Atrophic changes without acute abnormality.   Electronically Signed   By: Inez Catalina M.D.   On: 03/25/2014 14:47   Dg Chest Port 1 View  03/25/2014   CLINICAL DATA:  Confusion.  Leukemia.  COPD.  EXAM: PORTABLE CHEST - 1 VIEW  COMPARISON:  05/21/2013  FINDINGS: Airway thickening is present, suggesting bronchitis or reactive airways disease. Low lung volumes are present, causing crowding of the pulmonary vasculature. Cardiac and mediastinal margins appear normal. Right shoulder joint prosthesis.  IMPRESSION: 1. Airway thickening is present, suggesting bronchitis or reactive airways disease.   Electronically Signed   By: Sherryl Barters M.D.   On: 03/25/2014 13:51     EKG Interpretation   Date/Time:  Tuesday March 25 2014 15:16:18 EST Ventricular Rate:  84 PR Interval:  167 QRS Duration: 101 QT Interval:  360 QTC Calculation: 425 R Axis:   57 Text Interpretation:  Sinus rhythm Confirmed by Ronit Cranfield  MD, Frederico Gerling [phone])  on 03/25/2014 3:48:41 PM      MDM   Final diagnoses:  Confused  UTI (lower urinary tract infection)  Narcotic abuse  femoral stick done by md,  Blood difficult to draw and was hemolized.   K elevated from hemolized specimin.  Pt did not want another blood draw and ekg showed no Twave peaked.    Pt was given narcan and she awoke and was oriented to person place and  situation and wanted to be sent home.  She will be treated for uti and follow up with her md    Maudry Diego, MD 03/25/14 [phone]

## 2014-03-25 NOTE — ED Notes (Signed)
IV team at bedside 

## 2014-04-11 DIAGNOSIS — F328 Other depressive episodes: Secondary | ICD-10-CM | POA: Diagnosis not present

## 2014-04-11 DIAGNOSIS — E890 Postprocedural hypothyroidism: Secondary | ICD-10-CM | POA: Diagnosis not present

## 2014-04-11 DIAGNOSIS — E782 Mixed hyperlipidemia: Secondary | ICD-10-CM | POA: Diagnosis not present

## 2014-04-12 DIAGNOSIS — F328 Other depressive episodes: Secondary | ICD-10-CM | POA: Diagnosis not present

## 2014-04-12 DIAGNOSIS — E890 Postprocedural hypothyroidism: Secondary | ICD-10-CM | POA: Diagnosis not present

## 2014-04-12 DIAGNOSIS — E782 Mixed hyperlipidemia: Secondary | ICD-10-CM | POA: Diagnosis not present

## 2014-04-13 DIAGNOSIS — F328 Other depressive episodes: Secondary | ICD-10-CM | POA: Diagnosis not present

## 2014-04-13 DIAGNOSIS — E890 Postprocedural hypothyroidism: Secondary | ICD-10-CM | POA: Diagnosis not present

## 2014-04-13 DIAGNOSIS — E782 Mixed hyperlipidemia: Secondary | ICD-10-CM | POA: Diagnosis not present

## 2014-04-14 DIAGNOSIS — M5431 Sciatica, right side: Secondary | ICD-10-CM | POA: Diagnosis not present

## 2014-04-21 DIAGNOSIS — F328 Other depressive episodes: Secondary | ICD-10-CM | POA: Diagnosis not present

## 2014-04-21 DIAGNOSIS — E782 Mixed hyperlipidemia: Secondary | ICD-10-CM | POA: Diagnosis not present

## 2014-04-21 DIAGNOSIS — E890 Postprocedural hypothyroidism: Secondary | ICD-10-CM | POA: Diagnosis not present

## 2014-04-22 DIAGNOSIS — E890 Postprocedural hypothyroidism: Secondary | ICD-10-CM | POA: Diagnosis not present

## 2014-04-22 DIAGNOSIS — F328 Other depressive episodes: Secondary | ICD-10-CM | POA: Diagnosis not present

## 2014-04-22 DIAGNOSIS — E782 Mixed hyperlipidemia: Secondary | ICD-10-CM | POA: Diagnosis not present

## 2014-04-23 DIAGNOSIS — E782 Mixed hyperlipidemia: Secondary | ICD-10-CM | POA: Diagnosis not present

## 2014-04-23 DIAGNOSIS — E890 Postprocedural hypothyroidism: Secondary | ICD-10-CM | POA: Diagnosis not present

## 2014-04-23 DIAGNOSIS — F328 Other depressive episodes: Secondary | ICD-10-CM | POA: Diagnosis not present

## 2014-04-24 DIAGNOSIS — E782 Mixed hyperlipidemia: Secondary | ICD-10-CM | POA: Diagnosis not present

## 2014-04-24 DIAGNOSIS — F328 Other depressive episodes: Secondary | ICD-10-CM | POA: Diagnosis not present

## 2014-04-24 DIAGNOSIS — E890 Postprocedural hypothyroidism: Secondary | ICD-10-CM | POA: Diagnosis not present

## 2014-04-25 DIAGNOSIS — E782 Mixed hyperlipidemia: Secondary | ICD-10-CM | POA: Diagnosis not present

## 2014-04-25 DIAGNOSIS — F328 Other depressive episodes: Secondary | ICD-10-CM | POA: Diagnosis not present

## 2014-04-25 DIAGNOSIS — E890 Postprocedural hypothyroidism: Secondary | ICD-10-CM | POA: Diagnosis not present

## 2014-04-26 DIAGNOSIS — E890 Postprocedural hypothyroidism: Secondary | ICD-10-CM | POA: Diagnosis not present

## 2014-04-26 DIAGNOSIS — E782 Mixed hyperlipidemia: Secondary | ICD-10-CM | POA: Diagnosis not present

## 2014-04-26 DIAGNOSIS — F328 Other depressive episodes: Secondary | ICD-10-CM | POA: Diagnosis not present

## 2014-04-27 DIAGNOSIS — E782 Mixed hyperlipidemia: Secondary | ICD-10-CM | POA: Diagnosis not present

## 2014-04-27 DIAGNOSIS — E890 Postprocedural hypothyroidism: Secondary | ICD-10-CM | POA: Diagnosis not present

## 2014-04-27 DIAGNOSIS — F328 Other depressive episodes: Secondary | ICD-10-CM | POA: Diagnosis not present

## 2014-04-28 DIAGNOSIS — F328 Other depressive episodes: Secondary | ICD-10-CM | POA: Diagnosis not present

## 2014-04-28 DIAGNOSIS — E782 Mixed hyperlipidemia: Secondary | ICD-10-CM | POA: Diagnosis not present

## 2014-04-28 DIAGNOSIS — E890 Postprocedural hypothyroidism: Secondary | ICD-10-CM | POA: Diagnosis not present

## 2014-04-29 DIAGNOSIS — E782 Mixed hyperlipidemia: Secondary | ICD-10-CM | POA: Diagnosis not present

## 2014-04-29 DIAGNOSIS — E890 Postprocedural hypothyroidism: Secondary | ICD-10-CM | POA: Diagnosis not present

## 2014-04-29 DIAGNOSIS — F328 Other depressive episodes: Secondary | ICD-10-CM | POA: Diagnosis not present

## 2014-04-30 DIAGNOSIS — F328 Other depressive episodes: Secondary | ICD-10-CM | POA: Diagnosis not present

## 2014-04-30 DIAGNOSIS — E782 Mixed hyperlipidemia: Secondary | ICD-10-CM | POA: Diagnosis not present

## 2014-04-30 DIAGNOSIS — E890 Postprocedural hypothyroidism: Secondary | ICD-10-CM | POA: Diagnosis not present

## 2014-05-01 DIAGNOSIS — E890 Postprocedural hypothyroidism: Secondary | ICD-10-CM | POA: Diagnosis not present

## 2014-05-01 DIAGNOSIS — F328 Other depressive episodes: Secondary | ICD-10-CM | POA: Diagnosis not present

## 2014-05-01 DIAGNOSIS — E782 Mixed hyperlipidemia: Secondary | ICD-10-CM | POA: Diagnosis not present

## 2014-05-02 DIAGNOSIS — E782 Mixed hyperlipidemia: Secondary | ICD-10-CM | POA: Diagnosis not present

## 2014-05-02 DIAGNOSIS — E890 Postprocedural hypothyroidism: Secondary | ICD-10-CM | POA: Diagnosis not present

## 2014-05-02 DIAGNOSIS — F328 Other depressive episodes: Secondary | ICD-10-CM | POA: Diagnosis not present

## 2014-05-03 DIAGNOSIS — F328 Other depressive episodes: Secondary | ICD-10-CM | POA: Diagnosis not present

## 2014-05-03 DIAGNOSIS — E782 Mixed hyperlipidemia: Secondary | ICD-10-CM | POA: Diagnosis not present

## 2014-05-03 DIAGNOSIS — E890 Postprocedural hypothyroidism: Secondary | ICD-10-CM | POA: Diagnosis not present

## 2014-05-04 DIAGNOSIS — E782 Mixed hyperlipidemia: Secondary | ICD-10-CM | POA: Diagnosis not present

## 2014-05-04 DIAGNOSIS — F328 Other depressive episodes: Secondary | ICD-10-CM | POA: Diagnosis not present

## 2014-05-04 DIAGNOSIS — E890 Postprocedural hypothyroidism: Secondary | ICD-10-CM | POA: Diagnosis not present

## 2014-05-05 DIAGNOSIS — E890 Postprocedural hypothyroidism: Secondary | ICD-10-CM | POA: Diagnosis not present

## 2014-05-05 DIAGNOSIS — E782 Mixed hyperlipidemia: Secondary | ICD-10-CM | POA: Diagnosis not present

## 2014-05-05 DIAGNOSIS — F328 Other depressive episodes: Secondary | ICD-10-CM | POA: Diagnosis not present

## 2014-05-12 DIAGNOSIS — E782 Mixed hyperlipidemia: Secondary | ICD-10-CM | POA: Diagnosis not present

## 2014-05-12 DIAGNOSIS — E890 Postprocedural hypothyroidism: Secondary | ICD-10-CM | POA: Diagnosis not present

## 2014-05-12 DIAGNOSIS — F328 Other depressive episodes: Secondary | ICD-10-CM | POA: Diagnosis not present

## 2014-05-13 DIAGNOSIS — E890 Postprocedural hypothyroidism: Secondary | ICD-10-CM | POA: Diagnosis not present

## 2014-05-13 DIAGNOSIS — F328 Other depressive episodes: Secondary | ICD-10-CM | POA: Diagnosis not present

## 2014-05-13 DIAGNOSIS — E782 Mixed hyperlipidemia: Secondary | ICD-10-CM | POA: Diagnosis not present

## 2014-05-14 DIAGNOSIS — M5431 Sciatica, right side: Secondary | ICD-10-CM | POA: Diagnosis not present

## 2014-05-14 DIAGNOSIS — E890 Postprocedural hypothyroidism: Secondary | ICD-10-CM | POA: Diagnosis not present

## 2014-05-14 DIAGNOSIS — E782 Mixed hyperlipidemia: Secondary | ICD-10-CM | POA: Diagnosis not present

## 2014-05-14 DIAGNOSIS — F328 Other depressive episodes: Secondary | ICD-10-CM | POA: Diagnosis not present

## 2014-05-15 DIAGNOSIS — E890 Postprocedural hypothyroidism: Secondary | ICD-10-CM | POA: Diagnosis not present

## 2014-05-15 DIAGNOSIS — F328 Other depressive episodes: Secondary | ICD-10-CM | POA: Diagnosis not present

## 2014-05-15 DIAGNOSIS — E782 Mixed hyperlipidemia: Secondary | ICD-10-CM | POA: Diagnosis not present

## 2014-05-16 DIAGNOSIS — E782 Mixed hyperlipidemia: Secondary | ICD-10-CM | POA: Diagnosis not present

## 2014-05-16 DIAGNOSIS — E890 Postprocedural hypothyroidism: Secondary | ICD-10-CM | POA: Diagnosis not present

## 2014-05-16 DIAGNOSIS — F328 Other depressive episodes: Secondary | ICD-10-CM | POA: Diagnosis not present

## 2014-05-17 DIAGNOSIS — E782 Mixed hyperlipidemia: Secondary | ICD-10-CM | POA: Diagnosis not present

## 2014-05-17 DIAGNOSIS — E890 Postprocedural hypothyroidism: Secondary | ICD-10-CM | POA: Diagnosis not present

## 2014-05-17 DIAGNOSIS — F328 Other depressive episodes: Secondary | ICD-10-CM | POA: Diagnosis not present

## 2014-05-18 DIAGNOSIS — F328 Other depressive episodes: Secondary | ICD-10-CM | POA: Diagnosis not present

## 2014-05-18 DIAGNOSIS — E890 Postprocedural hypothyroidism: Secondary | ICD-10-CM | POA: Diagnosis not present

## 2014-05-18 DIAGNOSIS — E782 Mixed hyperlipidemia: Secondary | ICD-10-CM | POA: Diagnosis not present

## 2014-05-19 DIAGNOSIS — E782 Mixed hyperlipidemia: Secondary | ICD-10-CM | POA: Diagnosis not present

## 2014-05-19 DIAGNOSIS — F328 Other depressive episodes: Secondary | ICD-10-CM | POA: Diagnosis not present

## 2014-05-19 DIAGNOSIS — E890 Postprocedural hypothyroidism: Secondary | ICD-10-CM | POA: Diagnosis not present

## 2014-05-20 DIAGNOSIS — F328 Other depressive episodes: Secondary | ICD-10-CM | POA: Diagnosis not present

## 2014-05-20 DIAGNOSIS — E890 Postprocedural hypothyroidism: Secondary | ICD-10-CM | POA: Diagnosis not present

## 2014-05-20 DIAGNOSIS — E782 Mixed hyperlipidemia: Secondary | ICD-10-CM | POA: Diagnosis not present

## 2014-05-21 DIAGNOSIS — E890 Postprocedural hypothyroidism: Secondary | ICD-10-CM | POA: Diagnosis not present

## 2014-05-21 DIAGNOSIS — E782 Mixed hyperlipidemia: Secondary | ICD-10-CM | POA: Diagnosis not present

## 2014-05-21 DIAGNOSIS — F328 Other depressive episodes: Secondary | ICD-10-CM | POA: Diagnosis not present

## 2014-05-22 DIAGNOSIS — F328 Other depressive episodes: Secondary | ICD-10-CM | POA: Diagnosis not present

## 2014-05-22 DIAGNOSIS — E782 Mixed hyperlipidemia: Secondary | ICD-10-CM | POA: Diagnosis not present

## 2014-05-22 DIAGNOSIS — E890 Postprocedural hypothyroidism: Secondary | ICD-10-CM | POA: Diagnosis not present

## 2014-05-23 DIAGNOSIS — F328 Other depressive episodes: Secondary | ICD-10-CM | POA: Diagnosis not present

## 2014-05-23 DIAGNOSIS — E782 Mixed hyperlipidemia: Secondary | ICD-10-CM | POA: Diagnosis not present

## 2014-05-23 DIAGNOSIS — E890 Postprocedural hypothyroidism: Secondary | ICD-10-CM | POA: Diagnosis not present

## 2014-05-24 DIAGNOSIS — E890 Postprocedural hypothyroidism: Secondary | ICD-10-CM | POA: Diagnosis not present

## 2014-05-24 DIAGNOSIS — F328 Other depressive episodes: Secondary | ICD-10-CM | POA: Diagnosis not present

## 2014-05-24 DIAGNOSIS — E782 Mixed hyperlipidemia: Secondary | ICD-10-CM | POA: Diagnosis not present

## 2014-05-25 DIAGNOSIS — F328 Other depressive episodes: Secondary | ICD-10-CM | POA: Diagnosis not present

## 2014-05-25 DIAGNOSIS — E890 Postprocedural hypothyroidism: Secondary | ICD-10-CM | POA: Diagnosis not present

## 2014-05-25 DIAGNOSIS — E782 Mixed hyperlipidemia: Secondary | ICD-10-CM | POA: Diagnosis not present

## 2014-05-26 DIAGNOSIS — F328 Other depressive episodes: Secondary | ICD-10-CM | POA: Diagnosis not present

## 2014-05-26 DIAGNOSIS — E890 Postprocedural hypothyroidism: Secondary | ICD-10-CM | POA: Diagnosis not present

## 2014-05-26 DIAGNOSIS — E782 Mixed hyperlipidemia: Secondary | ICD-10-CM | POA: Diagnosis not present

## 2014-05-27 DIAGNOSIS — F328 Other depressive episodes: Secondary | ICD-10-CM | POA: Diagnosis not present

## 2014-05-27 DIAGNOSIS — E782 Mixed hyperlipidemia: Secondary | ICD-10-CM | POA: Diagnosis not present

## 2014-05-27 DIAGNOSIS — E890 Postprocedural hypothyroidism: Secondary | ICD-10-CM | POA: Diagnosis not present

## 2014-05-28 DIAGNOSIS — F328 Other depressive episodes: Secondary | ICD-10-CM | POA: Diagnosis not present

## 2014-05-28 DIAGNOSIS — E890 Postprocedural hypothyroidism: Secondary | ICD-10-CM | POA: Diagnosis not present

## 2014-05-28 DIAGNOSIS — E782 Mixed hyperlipidemia: Secondary | ICD-10-CM | POA: Diagnosis not present

## 2014-05-29 DIAGNOSIS — E782 Mixed hyperlipidemia: Secondary | ICD-10-CM | POA: Diagnosis not present

## 2014-05-29 DIAGNOSIS — E890 Postprocedural hypothyroidism: Secondary | ICD-10-CM | POA: Diagnosis not present

## 2014-05-29 DIAGNOSIS — F328 Other depressive episodes: Secondary | ICD-10-CM | POA: Diagnosis not present

## 2014-05-30 DIAGNOSIS — E782 Mixed hyperlipidemia: Secondary | ICD-10-CM | POA: Diagnosis not present

## 2014-05-30 DIAGNOSIS — F328 Other depressive episodes: Secondary | ICD-10-CM | POA: Diagnosis not present

## 2014-05-30 DIAGNOSIS — E890 Postprocedural hypothyroidism: Secondary | ICD-10-CM | POA: Diagnosis not present

## 2014-05-31 DIAGNOSIS — F328 Other depressive episodes: Secondary | ICD-10-CM | POA: Diagnosis not present

## 2014-05-31 DIAGNOSIS — E782 Mixed hyperlipidemia: Secondary | ICD-10-CM | POA: Diagnosis not present

## 2014-05-31 DIAGNOSIS — E890 Postprocedural hypothyroidism: Secondary | ICD-10-CM | POA: Diagnosis not present

## 2014-06-01 DIAGNOSIS — E890 Postprocedural hypothyroidism: Secondary | ICD-10-CM | POA: Diagnosis not present

## 2014-06-01 DIAGNOSIS — F328 Other depressive episodes: Secondary | ICD-10-CM | POA: Diagnosis not present

## 2014-06-01 DIAGNOSIS — E782 Mixed hyperlipidemia: Secondary | ICD-10-CM | POA: Diagnosis not present

## 2014-06-02 DIAGNOSIS — E890 Postprocedural hypothyroidism: Secondary | ICD-10-CM | POA: Diagnosis not present

## 2014-06-02 DIAGNOSIS — E782 Mixed hyperlipidemia: Secondary | ICD-10-CM | POA: Diagnosis not present

## 2014-06-02 DIAGNOSIS — F328 Other depressive episodes: Secondary | ICD-10-CM | POA: Diagnosis not present

## 2014-06-02 DIAGNOSIS — G40001 Localization-related (focal) (partial) idiopathic epilepsy and epileptic syndromes with seizures of localized onset, not intractable, with status epilepticus: Secondary | ICD-10-CM | POA: Diagnosis not present

## 2014-06-03 DIAGNOSIS — E782 Mixed hyperlipidemia: Secondary | ICD-10-CM | POA: Diagnosis not present

## 2014-06-03 DIAGNOSIS — E890 Postprocedural hypothyroidism: Secondary | ICD-10-CM | POA: Diagnosis not present

## 2014-06-03 DIAGNOSIS — F328 Other depressive episodes: Secondary | ICD-10-CM | POA: Diagnosis not present

## 2014-06-04 DIAGNOSIS — E890 Postprocedural hypothyroidism: Secondary | ICD-10-CM | POA: Diagnosis not present

## 2014-06-04 DIAGNOSIS — F328 Other depressive episodes: Secondary | ICD-10-CM | POA: Diagnosis not present

## 2014-06-04 DIAGNOSIS — E782 Mixed hyperlipidemia: Secondary | ICD-10-CM | POA: Diagnosis not present

## 2014-06-05 DIAGNOSIS — F328 Other depressive episodes: Secondary | ICD-10-CM | POA: Diagnosis not present

## 2014-06-05 DIAGNOSIS — E890 Postprocedural hypothyroidism: Secondary | ICD-10-CM | POA: Diagnosis not present

## 2014-06-05 DIAGNOSIS — E782 Mixed hyperlipidemia: Secondary | ICD-10-CM | POA: Diagnosis not present

## 2014-06-06 DIAGNOSIS — F328 Other depressive episodes: Secondary | ICD-10-CM | POA: Diagnosis not present

## 2014-06-06 DIAGNOSIS — E782 Mixed hyperlipidemia: Secondary | ICD-10-CM | POA: Diagnosis not present

## 2014-06-06 DIAGNOSIS — E890 Postprocedural hypothyroidism: Secondary | ICD-10-CM | POA: Diagnosis not present

## 2014-06-07 DIAGNOSIS — E890 Postprocedural hypothyroidism: Secondary | ICD-10-CM | POA: Diagnosis not present

## 2014-06-07 DIAGNOSIS — F328 Other depressive episodes: Secondary | ICD-10-CM | POA: Diagnosis not present

## 2014-06-07 DIAGNOSIS — E782 Mixed hyperlipidemia: Secondary | ICD-10-CM | POA: Diagnosis not present

## 2014-06-10 DIAGNOSIS — E890 Postprocedural hypothyroidism: Secondary | ICD-10-CM | POA: Diagnosis not present

## 2014-06-10 DIAGNOSIS — E782 Mixed hyperlipidemia: Secondary | ICD-10-CM | POA: Diagnosis not present

## 2014-06-10 DIAGNOSIS — F328 Other depressive episodes: Secondary | ICD-10-CM | POA: Diagnosis not present

## 2014-06-11 DIAGNOSIS — E782 Mixed hyperlipidemia: Secondary | ICD-10-CM | POA: Diagnosis not present

## 2014-06-11 DIAGNOSIS — I10 Essential (primary) hypertension: Secondary | ICD-10-CM | POA: Diagnosis not present

## 2014-06-11 DIAGNOSIS — F328 Other depressive episodes: Secondary | ICD-10-CM | POA: Diagnosis not present

## 2014-06-11 DIAGNOSIS — E890 Postprocedural hypothyroidism: Secondary | ICD-10-CM | POA: Diagnosis not present

## 2014-06-12 DIAGNOSIS — F328 Other depressive episodes: Secondary | ICD-10-CM | POA: Diagnosis not present

## 2014-06-12 DIAGNOSIS — E890 Postprocedural hypothyroidism: Secondary | ICD-10-CM | POA: Diagnosis not present

## 2014-06-12 DIAGNOSIS — E782 Mixed hyperlipidemia: Secondary | ICD-10-CM | POA: Diagnosis not present

## 2014-06-13 DIAGNOSIS — E890 Postprocedural hypothyroidism: Secondary | ICD-10-CM | POA: Diagnosis not present

## 2014-06-13 DIAGNOSIS — E782 Mixed hyperlipidemia: Secondary | ICD-10-CM | POA: Diagnosis not present

## 2014-06-13 DIAGNOSIS — F328 Other depressive episodes: Secondary | ICD-10-CM | POA: Diagnosis not present

## 2014-06-14 DIAGNOSIS — F328 Other depressive episodes: Secondary | ICD-10-CM | POA: Diagnosis not present

## 2014-06-14 DIAGNOSIS — E890 Postprocedural hypothyroidism: Secondary | ICD-10-CM | POA: Diagnosis not present

## 2014-06-14 DIAGNOSIS — E782 Mixed hyperlipidemia: Secondary | ICD-10-CM | POA: Diagnosis not present

## 2014-06-15 DIAGNOSIS — E782 Mixed hyperlipidemia: Secondary | ICD-10-CM | POA: Diagnosis not present

## 2014-06-15 DIAGNOSIS — E890 Postprocedural hypothyroidism: Secondary | ICD-10-CM | POA: Diagnosis not present

## 2014-06-15 DIAGNOSIS — F328 Other depressive episodes: Secondary | ICD-10-CM | POA: Diagnosis not present

## 2014-06-16 DIAGNOSIS — E890 Postprocedural hypothyroidism: Secondary | ICD-10-CM | POA: Diagnosis not present

## 2014-06-16 DIAGNOSIS — F328 Other depressive episodes: Secondary | ICD-10-CM | POA: Diagnosis not present

## 2014-06-16 DIAGNOSIS — E782 Mixed hyperlipidemia: Secondary | ICD-10-CM | POA: Diagnosis not present

## 2014-06-17 DIAGNOSIS — E890 Postprocedural hypothyroidism: Secondary | ICD-10-CM | POA: Diagnosis not present

## 2014-06-17 DIAGNOSIS — F328 Other depressive episodes: Secondary | ICD-10-CM | POA: Diagnosis not present

## 2014-06-17 DIAGNOSIS — E782 Mixed hyperlipidemia: Secondary | ICD-10-CM | POA: Diagnosis not present

## 2014-06-18 DIAGNOSIS — E890 Postprocedural hypothyroidism: Secondary | ICD-10-CM | POA: Diagnosis not present

## 2014-06-18 DIAGNOSIS — E782 Mixed hyperlipidemia: Secondary | ICD-10-CM | POA: Diagnosis not present

## 2014-06-18 DIAGNOSIS — F328 Other depressive episodes: Secondary | ICD-10-CM | POA: Diagnosis not present

## 2014-06-19 DIAGNOSIS — E782 Mixed hyperlipidemia: Secondary | ICD-10-CM | POA: Diagnosis not present

## 2014-06-19 DIAGNOSIS — F328 Other depressive episodes: Secondary | ICD-10-CM | POA: Diagnosis not present

## 2014-06-19 DIAGNOSIS — E890 Postprocedural hypothyroidism: Secondary | ICD-10-CM | POA: Diagnosis not present

## 2014-06-20 DIAGNOSIS — E782 Mixed hyperlipidemia: Secondary | ICD-10-CM | POA: Diagnosis not present

## 2014-06-20 DIAGNOSIS — F328 Other depressive episodes: Secondary | ICD-10-CM | POA: Diagnosis not present

## 2014-06-20 DIAGNOSIS — E890 Postprocedural hypothyroidism: Secondary | ICD-10-CM | POA: Diagnosis not present

## 2014-06-21 DIAGNOSIS — E890 Postprocedural hypothyroidism: Secondary | ICD-10-CM | POA: Diagnosis not present

## 2014-06-21 DIAGNOSIS — F328 Other depressive episodes: Secondary | ICD-10-CM | POA: Diagnosis not present

## 2014-06-21 DIAGNOSIS — E782 Mixed hyperlipidemia: Secondary | ICD-10-CM | POA: Diagnosis not present

## 2014-06-22 DIAGNOSIS — E782 Mixed hyperlipidemia: Secondary | ICD-10-CM | POA: Diagnosis not present

## 2014-06-22 DIAGNOSIS — E890 Postprocedural hypothyroidism: Secondary | ICD-10-CM | POA: Diagnosis not present

## 2014-06-22 DIAGNOSIS — F328 Other depressive episodes: Secondary | ICD-10-CM | POA: Diagnosis not present

## 2014-06-23 DIAGNOSIS — E890 Postprocedural hypothyroidism: Secondary | ICD-10-CM | POA: Diagnosis not present

## 2014-06-23 DIAGNOSIS — E782 Mixed hyperlipidemia: Secondary | ICD-10-CM | POA: Diagnosis not present

## 2014-06-23 DIAGNOSIS — F328 Other depressive episodes: Secondary | ICD-10-CM | POA: Diagnosis not present

## 2014-06-24 DIAGNOSIS — E890 Postprocedural hypothyroidism: Secondary | ICD-10-CM | POA: Diagnosis not present

## 2014-06-24 DIAGNOSIS — E782 Mixed hyperlipidemia: Secondary | ICD-10-CM | POA: Diagnosis not present

## 2014-06-24 DIAGNOSIS — F328 Other depressive episodes: Secondary | ICD-10-CM | POA: Diagnosis not present

## 2014-06-25 DIAGNOSIS — E890 Postprocedural hypothyroidism: Secondary | ICD-10-CM | POA: Diagnosis not present

## 2014-06-25 DIAGNOSIS — E782 Mixed hyperlipidemia: Secondary | ICD-10-CM | POA: Diagnosis not present

## 2014-06-25 DIAGNOSIS — F328 Other depressive episodes: Secondary | ICD-10-CM | POA: Diagnosis not present

## 2014-06-26 DIAGNOSIS — E782 Mixed hyperlipidemia: Secondary | ICD-10-CM | POA: Diagnosis not present

## 2014-06-26 DIAGNOSIS — F328 Other depressive episodes: Secondary | ICD-10-CM | POA: Diagnosis not present

## 2014-06-26 DIAGNOSIS — E890 Postprocedural hypothyroidism: Secondary | ICD-10-CM | POA: Diagnosis not present

## 2014-06-27 DIAGNOSIS — E782 Mixed hyperlipidemia: Secondary | ICD-10-CM | POA: Diagnosis not present

## 2014-06-27 DIAGNOSIS — F328 Other depressive episodes: Secondary | ICD-10-CM | POA: Diagnosis not present

## 2014-06-27 DIAGNOSIS — E890 Postprocedural hypothyroidism: Secondary | ICD-10-CM | POA: Diagnosis not present

## 2014-06-28 DIAGNOSIS — F328 Other depressive episodes: Secondary | ICD-10-CM | POA: Diagnosis not present

## 2014-06-28 DIAGNOSIS — E782 Mixed hyperlipidemia: Secondary | ICD-10-CM | POA: Diagnosis not present

## 2014-06-28 DIAGNOSIS — E890 Postprocedural hypothyroidism: Secondary | ICD-10-CM | POA: Diagnosis not present

## 2014-06-29 DIAGNOSIS — E890 Postprocedural hypothyroidism: Secondary | ICD-10-CM | POA: Diagnosis not present

## 2014-06-29 DIAGNOSIS — E782 Mixed hyperlipidemia: Secondary | ICD-10-CM | POA: Diagnosis not present

## 2014-06-29 DIAGNOSIS — F328 Other depressive episodes: Secondary | ICD-10-CM | POA: Diagnosis not present

## 2014-06-30 DIAGNOSIS — E782 Mixed hyperlipidemia: Secondary | ICD-10-CM | POA: Diagnosis not present

## 2014-06-30 DIAGNOSIS — F328 Other depressive episodes: Secondary | ICD-10-CM | POA: Diagnosis not present

## 2014-06-30 DIAGNOSIS — E890 Postprocedural hypothyroidism: Secondary | ICD-10-CM | POA: Diagnosis not present

## 2014-07-01 DIAGNOSIS — E782 Mixed hyperlipidemia: Secondary | ICD-10-CM | POA: Diagnosis not present

## 2014-07-01 DIAGNOSIS — E890 Postprocedural hypothyroidism: Secondary | ICD-10-CM | POA: Diagnosis not present

## 2014-07-01 DIAGNOSIS — F328 Other depressive episodes: Secondary | ICD-10-CM | POA: Diagnosis not present

## 2014-07-02 DIAGNOSIS — E782 Mixed hyperlipidemia: Secondary | ICD-10-CM | POA: Diagnosis not present

## 2014-07-02 DIAGNOSIS — F328 Other depressive episodes: Secondary | ICD-10-CM | POA: Diagnosis not present

## 2014-07-02 DIAGNOSIS — E890 Postprocedural hypothyroidism: Secondary | ICD-10-CM | POA: Diagnosis not present

## 2014-07-03 DIAGNOSIS — F328 Other depressive episodes: Secondary | ICD-10-CM | POA: Diagnosis not present

## 2014-07-03 DIAGNOSIS — E890 Postprocedural hypothyroidism: Secondary | ICD-10-CM | POA: Diagnosis not present

## 2014-07-03 DIAGNOSIS — E782 Mixed hyperlipidemia: Secondary | ICD-10-CM | POA: Diagnosis not present

## 2014-07-04 DIAGNOSIS — E782 Mixed hyperlipidemia: Secondary | ICD-10-CM | POA: Diagnosis not present

## 2014-07-04 DIAGNOSIS — E890 Postprocedural hypothyroidism: Secondary | ICD-10-CM | POA: Diagnosis not present

## 2014-07-04 DIAGNOSIS — F328 Other depressive episodes: Secondary | ICD-10-CM | POA: Diagnosis not present

## 2014-07-05 DIAGNOSIS — F328 Other depressive episodes: Secondary | ICD-10-CM | POA: Diagnosis not present

## 2014-07-05 DIAGNOSIS — E890 Postprocedural hypothyroidism: Secondary | ICD-10-CM | POA: Diagnosis not present

## 2014-07-05 DIAGNOSIS — E782 Mixed hyperlipidemia: Secondary | ICD-10-CM | POA: Diagnosis not present

## 2014-07-06 DIAGNOSIS — E782 Mixed hyperlipidemia: Secondary | ICD-10-CM | POA: Diagnosis not present

## 2014-07-06 DIAGNOSIS — E890 Postprocedural hypothyroidism: Secondary | ICD-10-CM | POA: Diagnosis not present

## 2014-07-06 DIAGNOSIS — F328 Other depressive episodes: Secondary | ICD-10-CM | POA: Diagnosis not present

## 2014-07-11 DIAGNOSIS — E782 Mixed hyperlipidemia: Secondary | ICD-10-CM | POA: Diagnosis not present

## 2014-07-11 DIAGNOSIS — F328 Other depressive episodes: Secondary | ICD-10-CM | POA: Diagnosis not present

## 2014-07-11 DIAGNOSIS — E890 Postprocedural hypothyroidism: Secondary | ICD-10-CM | POA: Diagnosis not present

## 2014-07-12 DIAGNOSIS — E782 Mixed hyperlipidemia: Secondary | ICD-10-CM | POA: Diagnosis not present

## 2014-07-12 DIAGNOSIS — F328 Other depressive episodes: Secondary | ICD-10-CM | POA: Diagnosis not present

## 2014-07-12 DIAGNOSIS — E890 Postprocedural hypothyroidism: Secondary | ICD-10-CM | POA: Diagnosis not present

## 2014-07-13 DIAGNOSIS — E890 Postprocedural hypothyroidism: Secondary | ICD-10-CM | POA: Diagnosis not present

## 2014-07-13 DIAGNOSIS — E782 Mixed hyperlipidemia: Secondary | ICD-10-CM | POA: Diagnosis not present

## 2014-07-13 DIAGNOSIS — F328 Other depressive episodes: Secondary | ICD-10-CM | POA: Diagnosis not present

## 2014-07-14 DIAGNOSIS — E782 Mixed hyperlipidemia: Secondary | ICD-10-CM | POA: Diagnosis not present

## 2014-07-14 DIAGNOSIS — E890 Postprocedural hypothyroidism: Secondary | ICD-10-CM | POA: Diagnosis not present

## 2014-07-14 DIAGNOSIS — F328 Other depressive episodes: Secondary | ICD-10-CM | POA: Diagnosis not present

## 2014-07-15 DIAGNOSIS — F328 Other depressive episodes: Secondary | ICD-10-CM | POA: Diagnosis not present

## 2014-07-15 DIAGNOSIS — E890 Postprocedural hypothyroidism: Secondary | ICD-10-CM | POA: Diagnosis not present

## 2014-07-15 DIAGNOSIS — E782 Mixed hyperlipidemia: Secondary | ICD-10-CM | POA: Diagnosis not present

## 2014-07-16 DIAGNOSIS — E890 Postprocedural hypothyroidism: Secondary | ICD-10-CM | POA: Diagnosis not present

## 2014-07-16 DIAGNOSIS — I10 Essential (primary) hypertension: Secondary | ICD-10-CM | POA: Diagnosis not present

## 2014-07-16 DIAGNOSIS — E782 Mixed hyperlipidemia: Secondary | ICD-10-CM | POA: Diagnosis not present

## 2014-07-16 DIAGNOSIS — F328 Other depressive episodes: Secondary | ICD-10-CM | POA: Diagnosis not present

## 2014-07-17 DIAGNOSIS — E890 Postprocedural hypothyroidism: Secondary | ICD-10-CM | POA: Diagnosis not present

## 2014-07-17 DIAGNOSIS — F328 Other depressive episodes: Secondary | ICD-10-CM | POA: Diagnosis not present

## 2014-07-17 DIAGNOSIS — E782 Mixed hyperlipidemia: Secondary | ICD-10-CM | POA: Diagnosis not present

## 2014-07-18 DIAGNOSIS — F328 Other depressive episodes: Secondary | ICD-10-CM | POA: Diagnosis not present

## 2014-07-18 DIAGNOSIS — E782 Mixed hyperlipidemia: Secondary | ICD-10-CM | POA: Diagnosis not present

## 2014-07-18 DIAGNOSIS — E890 Postprocedural hypothyroidism: Secondary | ICD-10-CM | POA: Diagnosis not present

## 2014-07-19 DIAGNOSIS — E782 Mixed hyperlipidemia: Secondary | ICD-10-CM | POA: Diagnosis not present

## 2014-07-19 DIAGNOSIS — E890 Postprocedural hypothyroidism: Secondary | ICD-10-CM | POA: Diagnosis not present

## 2014-07-19 DIAGNOSIS — F328 Other depressive episodes: Secondary | ICD-10-CM | POA: Diagnosis not present

## 2014-07-20 DIAGNOSIS — E782 Mixed hyperlipidemia: Secondary | ICD-10-CM | POA: Diagnosis not present

## 2014-07-20 DIAGNOSIS — F328 Other depressive episodes: Secondary | ICD-10-CM | POA: Diagnosis not present

## 2014-07-20 DIAGNOSIS — E890 Postprocedural hypothyroidism: Secondary | ICD-10-CM | POA: Diagnosis not present

## 2014-07-21 DIAGNOSIS — E890 Postprocedural hypothyroidism: Secondary | ICD-10-CM | POA: Diagnosis not present

## 2014-07-21 DIAGNOSIS — F328 Other depressive episodes: Secondary | ICD-10-CM | POA: Diagnosis not present

## 2014-07-21 DIAGNOSIS — E782 Mixed hyperlipidemia: Secondary | ICD-10-CM | POA: Diagnosis not present

## 2014-07-22 DIAGNOSIS — E890 Postprocedural hypothyroidism: Secondary | ICD-10-CM | POA: Diagnosis not present

## 2014-07-22 DIAGNOSIS — E782 Mixed hyperlipidemia: Secondary | ICD-10-CM | POA: Diagnosis not present

## 2014-07-22 DIAGNOSIS — F328 Other depressive episodes: Secondary | ICD-10-CM | POA: Diagnosis not present

## 2014-07-23 DIAGNOSIS — E782 Mixed hyperlipidemia: Secondary | ICD-10-CM | POA: Diagnosis not present

## 2014-07-23 DIAGNOSIS — E890 Postprocedural hypothyroidism: Secondary | ICD-10-CM | POA: Diagnosis not present

## 2014-07-23 DIAGNOSIS — F328 Other depressive episodes: Secondary | ICD-10-CM | POA: Diagnosis not present

## 2014-07-24 DIAGNOSIS — E890 Postprocedural hypothyroidism: Secondary | ICD-10-CM | POA: Diagnosis not present

## 2014-07-24 DIAGNOSIS — E782 Mixed hyperlipidemia: Secondary | ICD-10-CM | POA: Diagnosis not present

## 2014-07-24 DIAGNOSIS — F328 Other depressive episodes: Secondary | ICD-10-CM | POA: Diagnosis not present

## 2014-07-25 DIAGNOSIS — E890 Postprocedural hypothyroidism: Secondary | ICD-10-CM | POA: Diagnosis not present

## 2014-07-25 DIAGNOSIS — F328 Other depressive episodes: Secondary | ICD-10-CM | POA: Diagnosis not present

## 2014-07-25 DIAGNOSIS — E782 Mixed hyperlipidemia: Secondary | ICD-10-CM | POA: Diagnosis not present

## 2014-07-26 DIAGNOSIS — E782 Mixed hyperlipidemia: Secondary | ICD-10-CM | POA: Diagnosis not present

## 2014-07-26 DIAGNOSIS — E890 Postprocedural hypothyroidism: Secondary | ICD-10-CM | POA: Diagnosis not present

## 2014-07-26 DIAGNOSIS — F328 Other depressive episodes: Secondary | ICD-10-CM | POA: Diagnosis not present

## 2014-07-27 DIAGNOSIS — F328 Other depressive episodes: Secondary | ICD-10-CM | POA: Diagnosis not present

## 2014-07-27 DIAGNOSIS — E782 Mixed hyperlipidemia: Secondary | ICD-10-CM | POA: Diagnosis not present

## 2014-07-27 DIAGNOSIS — E890 Postprocedural hypothyroidism: Secondary | ICD-10-CM | POA: Diagnosis not present

## 2014-07-28 DIAGNOSIS — E890 Postprocedural hypothyroidism: Secondary | ICD-10-CM | POA: Diagnosis not present

## 2014-07-28 DIAGNOSIS — F328 Other depressive episodes: Secondary | ICD-10-CM | POA: Diagnosis not present

## 2014-07-28 DIAGNOSIS — E782 Mixed hyperlipidemia: Secondary | ICD-10-CM | POA: Diagnosis not present

## 2014-07-29 DIAGNOSIS — E890 Postprocedural hypothyroidism: Secondary | ICD-10-CM | POA: Diagnosis not present

## 2014-07-29 DIAGNOSIS — E782 Mixed hyperlipidemia: Secondary | ICD-10-CM | POA: Diagnosis not present

## 2014-07-29 DIAGNOSIS — F328 Other depressive episodes: Secondary | ICD-10-CM | POA: Diagnosis not present

## 2014-07-30 DIAGNOSIS — E782 Mixed hyperlipidemia: Secondary | ICD-10-CM | POA: Diagnosis not present

## 2014-07-30 DIAGNOSIS — F328 Other depressive episodes: Secondary | ICD-10-CM | POA: Diagnosis not present

## 2014-07-30 DIAGNOSIS — M5431 Sciatica, right side: Secondary | ICD-10-CM | POA: Diagnosis not present

## 2014-07-30 DIAGNOSIS — E890 Postprocedural hypothyroidism: Secondary | ICD-10-CM | POA: Diagnosis not present

## 2014-07-31 DIAGNOSIS — E890 Postprocedural hypothyroidism: Secondary | ICD-10-CM | POA: Diagnosis not present

## 2014-07-31 DIAGNOSIS — E782 Mixed hyperlipidemia: Secondary | ICD-10-CM | POA: Diagnosis not present

## 2014-07-31 DIAGNOSIS — F328 Other depressive episodes: Secondary | ICD-10-CM | POA: Diagnosis not present

## 2014-08-01 DIAGNOSIS — E782 Mixed hyperlipidemia: Secondary | ICD-10-CM | POA: Diagnosis not present

## 2014-08-01 DIAGNOSIS — F328 Other depressive episodes: Secondary | ICD-10-CM | POA: Diagnosis not present

## 2014-08-01 DIAGNOSIS — E890 Postprocedural hypothyroidism: Secondary | ICD-10-CM | POA: Diagnosis not present

## 2014-08-02 DIAGNOSIS — E782 Mixed hyperlipidemia: Secondary | ICD-10-CM | POA: Diagnosis not present

## 2014-08-02 DIAGNOSIS — F328 Other depressive episodes: Secondary | ICD-10-CM | POA: Diagnosis not present

## 2014-08-02 DIAGNOSIS — E890 Postprocedural hypothyroidism: Secondary | ICD-10-CM | POA: Diagnosis not present

## 2014-08-03 DIAGNOSIS — E890 Postprocedural hypothyroidism: Secondary | ICD-10-CM | POA: Diagnosis not present

## 2014-08-03 DIAGNOSIS — F328 Other depressive episodes: Secondary | ICD-10-CM | POA: Diagnosis not present

## 2014-08-03 DIAGNOSIS — E782 Mixed hyperlipidemia: Secondary | ICD-10-CM | POA: Diagnosis not present

## 2014-08-04 DIAGNOSIS — F328 Other depressive episodes: Secondary | ICD-10-CM | POA: Diagnosis not present

## 2014-08-04 DIAGNOSIS — E782 Mixed hyperlipidemia: Secondary | ICD-10-CM | POA: Diagnosis not present

## 2014-08-04 DIAGNOSIS — E890 Postprocedural hypothyroidism: Secondary | ICD-10-CM | POA: Diagnosis not present

## 2014-08-05 DIAGNOSIS — F328 Other depressive episodes: Secondary | ICD-10-CM | POA: Diagnosis not present

## 2014-08-05 DIAGNOSIS — E890 Postprocedural hypothyroidism: Secondary | ICD-10-CM | POA: Diagnosis not present

## 2014-08-05 DIAGNOSIS — E782 Mixed hyperlipidemia: Secondary | ICD-10-CM | POA: Diagnosis not present

## 2014-08-06 DIAGNOSIS — E890 Postprocedural hypothyroidism: Secondary | ICD-10-CM | POA: Diagnosis not present

## 2014-08-06 DIAGNOSIS — E782 Mixed hyperlipidemia: Secondary | ICD-10-CM | POA: Diagnosis not present

## 2014-08-06 DIAGNOSIS — F328 Other depressive episodes: Secondary | ICD-10-CM | POA: Diagnosis not present

## 2014-08-07 DIAGNOSIS — F328 Other depressive episodes: Secondary | ICD-10-CM | POA: Diagnosis not present

## 2014-08-07 DIAGNOSIS — E890 Postprocedural hypothyroidism: Secondary | ICD-10-CM | POA: Diagnosis not present

## 2014-08-07 DIAGNOSIS — E782 Mixed hyperlipidemia: Secondary | ICD-10-CM | POA: Diagnosis not present

## 2014-08-08 DIAGNOSIS — F328 Other depressive episodes: Secondary | ICD-10-CM | POA: Diagnosis not present

## 2014-08-08 DIAGNOSIS — E890 Postprocedural hypothyroidism: Secondary | ICD-10-CM | POA: Diagnosis not present

## 2014-08-08 DIAGNOSIS — E782 Mixed hyperlipidemia: Secondary | ICD-10-CM | POA: Diagnosis not present

## 2014-08-09 DIAGNOSIS — E890 Postprocedural hypothyroidism: Secondary | ICD-10-CM | POA: Diagnosis not present

## 2014-08-09 DIAGNOSIS — E782 Mixed hyperlipidemia: Secondary | ICD-10-CM | POA: Diagnosis not present

## 2014-08-09 DIAGNOSIS — F328 Other depressive episodes: Secondary | ICD-10-CM | POA: Diagnosis not present

## 2014-09-05 ENCOUNTER — Emergency Department (HOSPITAL_COMMUNITY): Payer: Medicare Other

## 2014-09-05 ENCOUNTER — Inpatient Hospital Stay (HOSPITAL_COMMUNITY)
Admission: EM | Admit: 2014-09-05 | Discharge: 2014-09-06 | DRG: 071 | Disposition: A | Discharge status: Home / Self Care | Payer: Medicare Other | Attending: Internal Medicine | Admitting: Internal Medicine

## 2014-09-05 ENCOUNTER — Encounter (HOSPITAL_COMMUNITY): Payer: Self-pay | Admitting: Emergency Medicine

## 2014-09-05 DIAGNOSIS — I2510 Atherosclerotic heart disease of native coronary artery without angina pectoris: Secondary | ICD-10-CM | POA: Diagnosis present

## 2014-09-05 DIAGNOSIS — G894 Chronic pain syndrome: Secondary | ICD-10-CM | POA: Diagnosis present

## 2014-09-05 DIAGNOSIS — N390 Urinary tract infection, site not specified: Secondary | ICD-10-CM | POA: Diagnosis present

## 2014-09-05 DIAGNOSIS — F259 Schizoaffective disorder, unspecified: Secondary | ICD-10-CM | POA: Diagnosis present

## 2014-09-05 DIAGNOSIS — Z7982 Long term (current) use of aspirin: Secondary | ICD-10-CM

## 2014-09-05 DIAGNOSIS — F17210 Nicotine dependence, cigarettes, uncomplicated: Secondary | ICD-10-CM | POA: Diagnosis present

## 2014-09-05 DIAGNOSIS — G9340 Encephalopathy, unspecified: Secondary | ICD-10-CM | POA: Diagnosis present

## 2014-09-05 DIAGNOSIS — E785 Hyperlipidemia, unspecified: Secondary | ICD-10-CM | POA: Diagnosis present

## 2014-09-05 DIAGNOSIS — F329 Major depressive disorder, single episode, unspecified: Secondary | ICD-10-CM | POA: Diagnosis present

## 2014-09-05 DIAGNOSIS — Z888 Allergy status to other drugs, medicaments and biological substances status: Secondary | ICD-10-CM

## 2014-09-05 DIAGNOSIS — R410 Disorientation, unspecified: Secondary | ICD-10-CM

## 2014-09-05 DIAGNOSIS — E039 Hypothyroidism, unspecified: Secondary | ICD-10-CM | POA: Diagnosis present

## 2014-09-05 DIAGNOSIS — J449 Chronic obstructive pulmonary disease, unspecified: Secondary | ICD-10-CM | POA: Diagnosis present

## 2014-09-05 DIAGNOSIS — G40909 Epilepsy, unspecified, not intractable, without status epilepticus: Secondary | ICD-10-CM | POA: Diagnosis present

## 2014-09-05 DIAGNOSIS — Z880 Allergy status to penicillin: Secondary | ICD-10-CM | POA: Diagnosis not present

## 2014-09-05 DIAGNOSIS — I252 Old myocardial infarction: Secondary | ICD-10-CM

## 2014-09-05 DIAGNOSIS — E038 Other specified hypothyroidism: Secondary | ICD-10-CM | POA: Diagnosis not present

## 2014-09-05 DIAGNOSIS — Z881 Allergy status to other antibiotic agents status: Secondary | ICD-10-CM | POA: Diagnosis not present

## 2014-09-05 DIAGNOSIS — Z885 Allergy status to narcotic agent status: Secondary | ICD-10-CM | POA: Diagnosis not present

## 2014-09-05 DIAGNOSIS — R4182 Altered mental status, unspecified: Secondary | ICD-10-CM | POA: Diagnosis not present

## 2014-09-05 DIAGNOSIS — F258 Other schizoaffective disorders: Secondary | ICD-10-CM | POA: Diagnosis not present

## 2014-09-05 LAB — URINALYSIS, ROUTINE W REFLEX MICROSCOPIC
BILIRUBIN URINE: NEGATIVE
BILIRUBIN URINE: NEGATIVE
GLUCOSE, UA: NEGATIVE mg/dL
Glucose, UA: NEGATIVE mg/dL
HGB URINE DIPSTICK: NEGATIVE
HGB URINE DIPSTICK: NEGATIVE
Ketones, ur: NEGATIVE mg/dL
Ketones, ur: NEGATIVE mg/dL
NITRITE: NEGATIVE
Nitrite: NEGATIVE
PROTEIN: NEGATIVE mg/dL
Protein, ur: NEGATIVE mg/dL
SPECIFIC GRAVITY, URINE: 1.016 (ref 1.005–1.030)
Specific Gravity, Urine: 1.021 (ref 1.005–1.030)
UROBILINOGEN UA: 0.2 mg/dL (ref 0.0–1.0)
Urobilinogen, UA: 0.2 mg/dL (ref 0.0–1.0)
pH: 5.5 (ref 5.0–8.0)
pH: 7 (ref 5.0–8.0)

## 2014-09-05 LAB — URINE MICROSCOPIC-ADD ON

## 2014-09-05 LAB — CBC WITH DIFFERENTIAL/PLATELET
BASOS ABS: 0 10*3/uL (ref 0.0–0.1)
Basophils Relative: 0 % (ref 0–1)
Eosinophils Absolute: 0.2 10*3/uL (ref 0.0–0.7)
Eosinophils Relative: 2 % (ref 0–5)
HCT: 40.2 % (ref 36.0–46.0)
Hemoglobin: 12.6 g/dL (ref 12.0–15.0)
LYMPHS ABS: 2.5 10*3/uL (ref 0.7–4.0)
LYMPHS PCT: 27 % (ref 12–46)
MCH: 30.2 pg (ref 26.0–34.0)
MCHC: 31.3 g/dL (ref 30.0–36.0)
MCV: 96.4 fL (ref 78.0–100.0)
Monocytes Absolute: 0.5 10*3/uL (ref 0.1–1.0)
Monocytes Relative: 6 % (ref 3–12)
NEUTROS ABS: 5.9 10*3/uL (ref 1.7–7.7)
Neutrophils Relative %: 65 % (ref 43–77)
Platelets: 290 10*3/uL (ref 150–400)
RBC: 4.17 MIL/uL (ref 3.87–5.11)
RDW: 13.7 % (ref 11.5–15.5)
WBC: 9.2 10*3/uL (ref 4.0–10.5)

## 2014-09-05 LAB — RAPID URINE DRUG SCREEN, HOSP PERFORMED
Amphetamines: NOT DETECTED
BARBITURATES: NOT DETECTED
Benzodiazepines: POSITIVE — AB
Cocaine: NOT DETECTED
Opiates: POSITIVE — AB
Tetrahydrocannabinol: NOT DETECTED

## 2014-09-05 LAB — COMPREHENSIVE METABOLIC PANEL
ALT: 22 U/L (ref 14–54)
AST: 25 U/L (ref 15–41)
Albumin: 3.5 g/dL (ref 3.5–5.0)
Alkaline Phosphatase: 67 U/L (ref 38–126)
Anion gap: 8 (ref 5–15)
BILIRUBIN TOTAL: 0.3 mg/dL (ref 0.3–1.2)
BUN: 11 mg/dL (ref 6–20)
CALCIUM: 8.7 mg/dL — AB (ref 8.9–10.3)
CO2: 27 mmol/L (ref 22–32)
Chloride: 106 mmol/L (ref 101–111)
Creatinine, Ser: 0.81 mg/dL (ref 0.44–1.00)
GFR calc non Af Amer: >60 mL/min (ref >60)
Glucose, Bld: 93 mg/dL (ref 65–99)
Potassium: 4.3 mmol/L (ref 3.5–5.1)
SODIUM: 141 mmol/L (ref 135–145)
TOTAL PROTEIN: 6.6 g/dL (ref 6.5–8.1)

## 2014-09-05 LAB — GRAM STAIN

## 2014-09-05 LAB — TSH: TSH: 0.332 u[IU]/mL — ABNORMAL LOW (ref 0.350–4.500)

## 2014-09-05 LAB — CBG MONITORING, ED: Glucose-Capillary: 79 mg/dL (ref 65–99)

## 2014-09-05 LAB — I-STAT CG4 LACTIC ACID, ED: Lactic Acid, Venous: 0.86 mmol/L (ref 0.5–2.0)

## 2014-09-05 LAB — AMMONIA: AMMONIA: 26 umol/L (ref 9–35)

## 2014-09-05 MED ORDER — ALBUTEROL SULFATE (2.5 MG/3ML) 0.083% IN NEBU: 2.5000 mg | INHALATION_SOLUTION | RESPIRATORY_TRACT | Status: DC | PRN

## 2014-09-05 MED ORDER — ACETAMINOPHEN 650 MG RE SUPP: 650.0000 mg | Freq: Four times a day (QID) | RECTAL | Status: DC | PRN

## 2014-09-05 MED ORDER — LEVOTHYROXINE SODIUM 112 MCG PO TABS
112.0000 ug | ORAL_TABLET | Freq: Every day | ORAL | Status: DC
  Administered 2014-09-06: 112 ug via ORAL
  Filled 2014-09-05 (×2): qty 1

## 2014-09-05 MED ORDER — ONDANSETRON HCL 4 MG/2ML IJ SOLN: 4.0000 mg | Freq: Four times a day (QID) | INTRAMUSCULAR | Status: DC | PRN

## 2014-09-05 MED ORDER — LORAZEPAM 0.5 MG PO TABS
0.5000 mg | ORAL_TABLET | Freq: Two times a day (BID) | ORAL | Status: DC | PRN
  Administered 2014-09-05: 0.5 mg via ORAL
  Filled 2014-09-05: qty 1

## 2014-09-05 MED ORDER — ACETAMINOPHEN 325 MG PO TABS: 650.0000 mg | ORAL_TABLET | Freq: Four times a day (QID) | ORAL | Status: DC | PRN

## 2014-09-05 MED ORDER — ARIPIPRAZOLE 10 MG PO TABS: 20.0000 mg | ORAL_TABLET | Freq: Every day | ORAL | Status: DC

## 2014-09-05 MED ORDER — SODIUM CHLORIDE 0.9 % IV SOLN
INTRAVENOUS | Status: DC
  Administered 2014-09-05: 18:00:00 via INTRAVENOUS

## 2014-09-05 MED ORDER — ATORVASTATIN CALCIUM 40 MG PO TABS
40.0000 mg | ORAL_TABLET | Freq: Every day | ORAL | Status: DC
  Administered 2014-09-05: 40 mg via ORAL
  Filled 2014-09-05 (×2): qty 1

## 2014-09-05 MED ORDER — POLYETHYLENE GLYCOL 3350 17 G PO PACK
17.0000 g | PACK | Freq: Two times a day (BID) | ORAL | Status: DC
  Filled 2014-09-05 (×3): qty 1

## 2014-09-05 MED ORDER — BENZTROPINE MESYLATE 2 MG PO TABS
2.0000 mg | ORAL_TABLET | Freq: Three times a day (TID) | ORAL | Status: DC
  Administered 2014-09-05 – 2014-09-06 (×2): 2 mg via ORAL
  Filled 2014-09-05 (×4): qty 1

## 2014-09-05 MED ORDER — ONDANSETRON HCL 4 MG PO TABS: 4.0000 mg | ORAL_TABLET | Freq: Four times a day (QID) | ORAL | Status: DC | PRN

## 2014-09-05 MED ORDER — OXYCODONE HCL 5 MG PO TABS
5.0000 mg | ORAL_TABLET | ORAL | Status: DC | PRN
  Administered 2014-09-05: 5 mg via ORAL
  Filled 2014-09-05: qty 1

## 2014-09-05 MED ORDER — POLYETHYLENE GLYCOL 3350 17 G PO PACK
17.0000 g | PACK | Freq: Every day | ORAL | Status: DC | PRN
  Filled 2014-09-05: qty 1

## 2014-09-05 MED ORDER — ASPIRIN 81 MG PO TABS: 81.0000 mg | ORAL_TABLET | Freq: Every day | ORAL | Status: DC

## 2014-09-05 MED ORDER — BISACODYL 10 MG RE SUPP
10.0000 mg | Freq: Every day | RECTAL | Status: DC | PRN
Start: 2014-09-05 — End: 2014-09-06

## 2014-09-05 MED ORDER — VENLAFAXINE HCL ER 150 MG PO CP24
150.0000 mg | ORAL_CAPSULE | Freq: Two times a day (BID) | ORAL | Status: DC
Start: 2014-09-06 — End: 2014-09-06
  Administered 2014-09-06 (×2): 150 mg via ORAL
  Filled 2014-09-05 (×3): qty 1

## 2014-09-05 MED ORDER — BISACODYL 10 MG RE SUPP: 10.0000 mg | Freq: Two times a day (BID) | RECTAL | Status: DC

## 2014-09-05 MED ORDER — HEPARIN SODIUM (PORCINE) 5000 UNIT/ML IJ SOLN
5000.0000 [IU] | Freq: Three times a day (TID) | INTRAMUSCULAR | Status: DC
  Administered 2014-09-05 – 2014-09-06 (×3): 5000 [IU] via SUBCUTANEOUS
  Filled 2014-09-05 (×5): qty 1

## 2014-09-05 MED ORDER — ASPIRIN EC 81 MG PO TBEC
81.0000 mg | DELAYED_RELEASE_TABLET | Freq: Every day | ORAL | Status: DC
  Administered 2014-09-05 – 2014-09-06 (×2): 81 mg via ORAL
  Filled 2014-09-05 (×2): qty 1

## 2014-09-05 MED ORDER — SODIUM CHLORIDE 0.9 % IV BOLUS (SEPSIS)
1000.0000 mL | Freq: Once | INTRAVENOUS | Status: AC
  Administered 2014-09-05: 1000 mL via INTRAVENOUS

## 2014-09-05 MED ORDER — ARIPIPRAZOLE 15 MG PO TABS
30.0000 mg | ORAL_TABLET | Freq: Every day | ORAL | Status: DC
  Administered 2014-09-05 – 2014-09-06 (×2): 30 mg via ORAL
  Filled 2014-09-05 (×2): qty 2

## 2014-09-05 MED ORDER — ACYCLOVIR 200 MG PO CAPS
200.0000 mg | ORAL_CAPSULE | Freq: Three times a day (TID) | ORAL | Status: DC
  Administered 2014-09-05 – 2014-09-06 (×2): 200 mg via ORAL
  Filled 2014-09-05 (×4): qty 1

## 2014-09-05 MED ORDER — VENLAFAXINE HCL ER 75 MG PO CP24
75.0000 mg | ORAL_CAPSULE | Freq: Every day | ORAL | Status: DC
  Filled 2014-09-05 (×3): qty 1

## 2014-09-05 MED ORDER — LEVOFLOXACIN IN D5W 500 MG/100ML IV SOLN
500.0000 mg | INTRAVENOUS | Status: DC
  Administered 2014-09-05: 500 mg via INTRAVENOUS
  Filled 2014-09-05 (×2): qty 100

## 2014-09-05 NOTE — ED Notes (Signed)
Pt care giver states she does NOT have a hx of dementia.

## 2014-09-05 NOTE — ED Notes (Signed)
2 IV attempts unsuccessful by this RN. Second RN to attempt.

## 2014-09-05 NOTE — ED Notes (Signed)
Pt coming from home, pt home health RN called EMS, last seen normal 10 am yesterday; nurse stated "she had an abnormal gait today." Odd speech. CBG 90 - peripheral vision loss left side. NAD. Hx of dementia, hx of ataxia and hx of dystonia. NSR @ 90. BP 130/60.

## 2014-09-05 NOTE — ED Provider Notes (Signed)
CSN: [id number]     Arrival date & time 09/05/14  1025 History   First MD Initiated Contact with Patient 09/05/14 1040     Chief Complaint  Patient presents with  . Altered Mental Status     (Consider location/radiation/quality/duration/timing/severity/associated sxs/prior Treatment) HPI Comments: LEVEL 5 EXCEPTION AS PT ALTERED  64 year old female past medical history is negative for dementia comes in with altered mental status. Patient is altered and unable to write any information. Only information from EMS. States they were called by home health worker who states she had odd speech and was acting abnormally. Last known normal yesterday. No other information available  Patient is a 64 y.o. female presenting with altered mental status.  Altered Mental Status Presenting symptoms: confusion and disorientation   Severity:  Unable to specify   Past Medical History  Diagnosis Date  . Seizure   . Epilepsy   . Hypothyroid   . High cholesterol   . Arm pain   . Dystonia   . Depression   . Chronic pain   . Coronary artery disease   . Leukemia   . Myocardial infarct   . Humerus fracture   . COPD (chronic obstructive pulmonary disease)    Past Surgical History  Procedure Laterality Date  . Shoulder surgery    . Humerus fracture surgery    . External ear surgery     No family history on file. History  Substance Use Topics  . Smoking status: Current Every Day Smoker -- 2.00 packs/day for 41 years    Types: Cigarettes  . Smokeless tobacco: Not on file  . Alcohol Use: No   OB History    No data available     Review of Systems  Unable to perform ROS: Mental status change  Psychiatric/Behavioral: Positive for confusion.      Allergies  Amoxicillin; Penicillins; Dilaudid; and Morphine and related  Home Medications   Prior to Admission medications   Medication Sig Start Date End Date Taking? Authorizing Provider  acetaminophen-codeine (TYLENOL #3) 300-30 MG per  tablet Take 1 tablet by mouth 2 (two) times daily as needed for moderate pain.  02/20/14   Historical Provider, MD  acyclovir (ZOVIRAX) 200 MG capsule Take 1 capsule by mouth 3 (three) times daily. 03/10/14   Historical Provider, MD  albuterol (PROVENTIL,VENTOLIN) 90 MCG/ACT inhaler Inhale 2 puffs into the lungs as directed. 1-2 puffs up to three times a day prn Patient taking differently: Inhale 1-2 puffs into the lungs 3 (three) times daily as needed for wheezing or shortness of breath. 1-2 puffs up to three times a day prn 11/22/10   Blane Ohara McDiarmid, MD  ARIPiprazole (ABILIFY) 20 MG tablet Take 1 tablet (20 mg total) by mouth daily. Per psychiatry Patient not taking: Reported on 03/21/2014 01/20/11   Lyndee Hensen, MD  ARIPiprazole (ABILIFY) 30 MG tablet Take 1 tablet by mouth daily. 03/10/14   Historical Provider, MD  aspirin 81 MG tablet Take 81 mg by mouth daily.     Historical Provider, MD  benztropine (COGENTIN) 2 MG tablet Take 1 tablet by mouth 3 (three) times daily. 01/06/14   Historical Provider, MD  ciprofloxacin (CIPRO) 500 MG tablet Take 1 tablet (500 mg total) by mouth 2 (two) times daily. 03/25/14   Milton Ferguson, MD  diphenhydrAMINE (BENADRYL) 50 MG tablet Take 50 mg by mouth 2 (two) times daily.     Historical Provider, MD  Fluticasone-Salmeterol (ADVAIR DISKUS) 250-50 MCG/DOSE AEPB Inhale 1 puff into the  lungs 2 (two) times daily. Patient not taking: Reported on 03/21/2014 09/02/10 09/02/11  Silverio Decamp, MD  gabapentin (NEURONTIN) 800 MG tablet Take 800-1,600 mg by mouth 4 (four) times daily. 1 tab three times daily and 2 tablets at beditme. 10/12/10   Blane Ohara McDiarmid, MD  levothyroxine (SYNTHROID, LEVOTHROID) 112 MCG tablet Take 112 mcg by mouth daily.      Historical Provider, MD  LORazepam (ATIVAN) 1 MG tablet Take 1 tablet by mouth 3 (three) times daily as needed for anxiety.  03/12/14   Historical Provider, MD  metaxalone (SKELAXIN) 800 MG tablet Take 1 tablet (800 mg  total) by mouth 3 (three) times daily as needed for muscle spasms. 03/22/14   Serita Grit, MD  Oxycodone HCl 10 MG TABS Take 2 tablets by mouth 4 (four) times daily. 03/12/14   Historical Provider, MD  OXYCONTIN 20 MG T12A 12 hr tablet Take 1 tablet by mouth 2 (two) times daily. 02/11/14   Historical Provider, MD  promethazine (PHENERGAN) 12.5 MG tablet Take 12.5 mg by mouth every 6 (six) hours as needed for nausea. For nausea    Historical Provider, MD  simvastatin (ZOCOR) 80 MG tablet Take 0.5 tablets (40 mg total) by mouth at bedtime. Patient taking differently: Take 80 mg by mouth at bedtime.  02/16/11   Lyndee Hensen, MD  temazepam (RESTORIL) 30 MG capsule Take 30 mg by mouth at bedtime as needed for sleep.     Historical Provider, MD  venlafaxine (EFFEXOR-XR) 150 MG 24 hr capsule Take 150 mg by mouth 2 (two) times daily. Pt takes 150mg  capsule in the morning and at noon, then takes 75mg  capsule at bedtime.    Historical Provider, MD  venlafaxine XR (EFFEXOR-XR) 75 MG 24 hr capsule Take 1 capsule by mouth every evening. Pt takes 150mg  capsule in the morning and at noon, then takes 75mg  capsule at bedtime. 03/10/14   Historical Provider, MD   BP 113/60 mmHg  Pulse 76  Temp(Src) 98.9 F (37.2 C) (Oral)  Resp 18  SpO2 97% Physical Exam  Constitutional: She appears well-developed.  HENT:  Head: Normocephalic and atraumatic.  Eyes: Pupils are equal, round, and reactive to light.  Cardiovascular: Normal rate and intact distal pulses.   No murmur heard. Pulmonary/Chest: Effort normal. No stridor. No respiratory distress.  Abdominal: Soft. She exhibits no distension. There is no tenderness.  Musculoskeletal:  No gross deformity  Neurological:  Oriented to person only. No gross cranial nerve abnormalities. Unable to cooperate with coordination exam. Tone normal. Reflexes normal.  Skin: Skin is warm.  Psychiatric:  confused  Vitals reviewed.   ED Course  Procedures (including critical  care time) Labs Review Labs Reviewed  COMPREHENSIVE METABOLIC PANEL - Abnormal; Notable for the following:    Calcium 8.7 (*)    All other components within normal limits  URINALYSIS, ROUTINE W REFLEX MICROSCOPIC (NOT AT [facility]) - Abnormal; Notable for the following:    APPearance HAZY (*)    Leukocytes, UA MODERATE (*)    All other components within normal limits  URINE MICROSCOPIC-ADD ON - Abnormal; Notable for the following:    Squamous Epithelial / LPF MANY (*)    Bacteria, UA FEW (*)    All other components within normal limits  GRAM STAIN  CBC WITH DIFFERENTIAL/PLATELET  AMMONIA  URINE RAPID DRUG SCREEN (HOSP PERFORMED) NOT AT [facility]  URINALYSIS, ROUTINE W REFLEX MICROSCOPIC (NOT AT [facility])  CBG MONITORING, ED  I-STAT CG4 LACTIC ACID, ED  Imaging Review Dg Chest 2 View  09/05/2014   CLINICAL DATA:  Altered mental status.  EXAM: CHEST  2 VIEW  COMPARISON:  March 25, 2014.  FINDINGS: The heart size and mediastinal contours are within normal limits. Both lungs are clear. No pneumothorax or pleural effusion is noted. Status Nur Rabold right shoulder arthroplasty.  IMPRESSION: No active cardiopulmonary disease.   Electronically Signed   By: Marijo Conception, M.D.   On: 09/05/2014 12:33   Ct Head Wo Contrast  09/05/2014   CLINICAL DATA:  Altered mental status.  EXAM: CT HEAD WITHOUT CONTRAST  TECHNIQUE: Contiguous axial images were obtained from the base of the skull through the vertex without intravenous contrast.  COMPARISON:  CT scan of March 25, 2014.  FINDINGS: Bony calvarium appears intact. Mild diffuse cortical atrophy is noted. No mass effect or midline shift is noted. Ventricular size is within normal limits. There is no evidence of mass lesion, hemorrhage or acute infarction.  IMPRESSION: Mild diffuse cortical atrophy. No acute intracranial abnormality seen.   Electronically Signed   By: Marijo Conception, M.D.   On: 09/05/2014 13:09     EKG Interpretation   Date/Time:  Friday  Sep 05 2014 10:31:27 EDT Ventricular Rate:  81 PR Interval:  163 QRS Duration: 95 QT Interval:  367 QTC Calculation: 426 R Axis:   17 Text Interpretation:  Sinus rhythm Probable left ventricular hypertrophy  No significant change was found Confirmed by [facility]  MD, TREY ([id number]) on  09/05/2014 12:27:01 PM      MDM  64 year old female presents to emergency department with altered mental status. On arrival patient is oriented only to person. She does appear acutely confused. Her neuro exam is nonfocal. She has no meningismus. Initial head CT shows no acute abnormalities. Chest x-ray showed no sign of infectious process. UA did have some leukocytes and bacteria however likely contaminated with many squamous epithelial cells. Possible early developing UTI. However likely to be causing altered mental status. CBC unremarkable. CMP without abnormality. Do not suspect infectious etiology causing altered mental status. On reexamination patient appears better. She is now alert and oriented 4. Given patient's fluctuating mental status/delirium likely need workup for cause of this. At this time do not suspect this to be infectious etiology although maybe early developing UTI. We'll defer anabiotic choice to hospitalist. Also of note while the worst part patient initially had blood pressures which dipped into the 96E and 45W systolic which responded appropriately to 1 L normal saline.  Final diagnoses:  Delirium        Robynn Pane, MD 09/05/14 1422  Serita Grit, MD 09/09/14 1141

## 2014-09-05 NOTE — ED Notes (Signed)
Pt's home health aid is Pamala Hurry. [phone]. Pamala Hurry will drive pt home if discharged, or would like to be notified of admission.

## 2014-09-05 NOTE — ED Notes (Signed)
Admitting at bedside. Would like pt to be I/O cathed for urine sample. Jasmine RN of 6E has been made aware of MD order.

## 2014-09-05 NOTE — H&P (Signed)
Patient Demographics  Dominique Acosta, is a 64 y.o. female  MRN: [id number]   DOB - 1950-08-17  Admit Date - 09/05/2014  Outpatient Primary MD for the patient is ROBERTS, Sharol Given, MD   With History of -  Past Medical History  Diagnosis Date  . Seizure   . Epilepsy   . Hypothyroid   . High cholesterol   . Arm pain   . Dystonia   . Depression   . Chronic pain   . Coronary artery disease   . Leukemia   . Myocardial infarct   . Humerus fracture   . COPD (chronic obstructive pulmonary disease)       Past Surgical History  Procedure Laterality Date  . Shoulder surgery    . Humerus fracture surgery    . External ear surgery      in for   Chief Complaint  Patient presents with  . Altered Mental Status     HPI  Dominique Acosta  is a 64 y.o. female, with past medical history of schizoaffective disorder, smoker, hypothyroidism, chronic pain syndrome, patient was brought by her aide, given altered mental status, as she has been acting abnormally over the last 24 hours, will aid reports patient had multiple falls over the last 3 days, initially in ED patient was confused and disoriented, could not provide any history, workup was significant for abnormal urinalysis(specimen was contaminated), CT head without acute findings, chest x-ray with no active cardiopulmonary disease, patient mental status started to improve while in ED, back to baseline at the time of my examination, patient reports she had recent changes in her pain medication, where she had her oxycodone decreased, and OxyContin stopped, denies any fever, chills, shortness of breath, cough, productive sputum, reports mild dysuria.    Review of Systems    In addition to the HPI above,  No Fever-chills, No Headache, No changes with Vision or hearing, No problems swallowing food or Liquids, No Chest pain, Cough or Shortness of Breath, No Abdominal pain, No Nausea or Vommitting, Bowel movements are regular, No Blood  in stool or Urine, Reports dysuria, No new skin rashes or bruises, No new joints pains-aches,  No new weakness, tingling, numbness in any extremity, No recent weight gain or loss, No polyuria, polydypsia or polyphagia, No significant Mental Stressors.  A full 10 point Review of Systems was done, except as stated above, all other Review of Systems were negative.   Social History History  Substance Use Topics  . Smoking status: Current Every Day Smoker -- 2.00 packs/day for 41 years    Types: Cigarettes  . Smokeless tobacco: Not on file  . Alcohol Use: No     Family History No family history on file. significant for hypertension in the family  Prior to Admission medications   Medication Sig Start Date End Date Taking? Authorizing Provider  acetaminophen-codeine (TYLENOL #3) 300-30 MG per tablet Take 1 tablet by mouth 2 (two) times daily as needed for moderate pain.  02/20/14   Historical Provider, MD  acyclovir (ZOVIRAX) 200 MG capsule Take 1 capsule by mouth 3 (three) times daily. 03/10/14   Historical Provider, MD  albuterol (PROVENTIL,VENTOLIN) 90 MCG/ACT inhaler Inhale 2 puffs into the lungs as directed. 1-2 puffs up to three times a day prn Patient taking differently: Inhale 1-2 puffs into the lungs 3 (three) times daily as needed for wheezing or shortness of breath. 1-2 puffs up to three times a day prn 11/22/10  Blane Ohara McDiarmid, MD  ARIPiprazole (ABILIFY) 20 MG tablet Take 1 tablet (20 mg total) by mouth daily. Per psychiatry Patient not taking: Reported on 03/21/2014 01/20/11   Lyndee Hensen, MD  ARIPiprazole (ABILIFY) 30 MG tablet Take 1 tablet by mouth daily. 03/10/14   Historical Provider, MD  aspirin 81 MG tablet Take 81 mg by mouth daily.     Historical Provider, MD  benztropine (COGENTIN) 2 MG tablet Take 1 tablet by mouth 3 (three) times daily. 01/06/14   Historical Provider, MD  ciprofloxacin (CIPRO) 500 MG tablet Take 1 tablet (500 mg total) by mouth 2 (two) times  daily. 03/25/14   Milton Ferguson, MD  diphenhydrAMINE (BENADRYL) 50 MG tablet Take 50 mg by mouth 2 (two) times daily.     Historical Provider, MD  Fluticasone-Salmeterol (ADVAIR DISKUS) 250-50 MCG/DOSE AEPB Inhale 1 puff into the lungs 2 (two) times daily. Patient not taking: Reported on 03/21/2014 09/02/10 09/02/11  Silverio Decamp, MD  gabapentin (NEURONTIN) 800 MG tablet Take 800-1,600 mg by mouth 4 (four) times daily. 1 tab three times daily and 2 tablets at beditme. 10/12/10   Blane Ohara McDiarmid, MD  levothyroxine (SYNTHROID, LEVOTHROID) 112 MCG tablet Take 112 mcg by mouth daily.      Historical Provider, MD  LORazepam (ATIVAN) 1 MG tablet Take 1 tablet by mouth 3 (three) times daily as needed for anxiety.  03/12/14   Historical Provider, MD  metaxalone (SKELAXIN) 800 MG tablet Take 1 tablet (800 mg total) by mouth 3 (three) times daily as needed for muscle spasms. 03/22/14   Serita Grit, MD  Oxycodone HCl 10 MG TABS Take 2 tablets by mouth 4 (four) times daily. 03/12/14   Historical Provider, MD  OXYCONTIN 20 MG T12A 12 hr tablet Take 1 tablet by mouth 2 (two) times daily. 02/11/14   Historical Provider, MD  promethazine (PHENERGAN) 12.5 MG tablet Take 12.5 mg by mouth every 6 (six) hours as needed for nausea. For nausea    Historical Provider, MD  simvastatin (ZOCOR) 80 MG tablet Take 0.5 tablets (40 mg total) by mouth at bedtime. Patient taking differently: Take 80 mg by mouth at bedtime.  02/16/11   Lyndee Hensen, MD  temazepam (RESTORIL) 30 MG capsule Take 30 mg by mouth at bedtime as needed for sleep.     Historical Provider, MD  venlafaxine (EFFEXOR-XR) 150 MG 24 hr capsule Take 150 mg by mouth 2 (two) times daily. Pt takes 150mg  capsule in the morning and at noon, then takes 75mg  capsule at bedtime.    Historical Provider, MD  venlafaxine XR (EFFEXOR-XR) 75 MG 24 hr capsule Take 1 capsule by mouth every evening. Pt takes 150mg  capsule in the morning and at noon, then takes 75mg  capsule at  bedtime. 03/10/14   Historical Provider, MD    Allergies  Allergen Reactions  . Amoxicillin Anaphylaxis  . Penicillins Anaphylaxis  . Dilaudid [Hydromorphone Hcl] Other (See Comments)    Psychosis (per patient)  . Morphine And Related Other (See Comments)    psychosis    Physical Exam  Vitals  Blood pressure 102/71, pulse 87, temperature 99.1 F (37.3 C), temperature source Oral, resp. rate 19, SpO2 99 %.   1. General well-nourished female lying in bed in NAD,    2. Normal affect and insight, Not Suicidal or Homicidal, Awake Alert, Oriented X 3.  3. No F.N deficits, ALL C.Nerves Intact, Strength 5/5 all 4 extremities, Sensation intact all 4 extremities, Plantars down going.  4.  Ears and Eyes appear Normal, Conjunctivae clear, PERRLA. Moist Oral Mucosa.  5. Supple Neck, No JVD, No cervical lymphadenopathy appriciated, No Carotid Bruits.  6. Symmetrical Chest wall movement, Good air movement bilaterally, CTAB.  7. RRR, No Gallops, Rubs or Murmurs, No Parasternal Heave.  8. Positive Bowel Sounds, Abdomen Soft, No tenderness, No organomegaly appriciated,No rebound -guarding or rigidity.  9.  No Cyanosis, Normal Skin Turgor, No Skin Rash or Bruise.  10. Good muscle tone,  joints appear normal , no effusions, Normal ROM.  11. No Palpable Lymph Nodes in Neck or Axillae    Data Review  CBC  Recent Labs Lab 09/05/14 1125  WBC 9.2  HGB 12.6  HCT 40.2  PLT 290  MCV 96.4  MCH 30.2  MCHC 31.3  RDW 13.7  LYMPHSABS 2.5  MONOABS 0.5  EOSABS 0.2  BASOSABS 0.0   ------------------------------------------------------------------------------------------------------------------  Chemistries   Recent Labs Lab 09/05/14 1125  NA 141  K 4.3  CL 106  CO2 27  GLUCOSE 93  BUN 11  CREATININE 0.81  CALCIUM 8.7*  AST 25  ALT 22  ALKPHOS 67  BILITOT 0.3    ------------------------------------------------------------------------------------------------------------------ CrCl cannot be calculated (Unknown ideal weight.). ------------------------------------------------------------------------------------------------------------------ No results for input(s): TSH, T4TOTAL, T3FREE, THYROIDAB in the last 72 hours.  Invalid input(s): FREET3   Coagulation profile No results for input(s): INR, PROTIME in the last 168 hours. ------------------------------------------------------------------------------------------------------------------- No results for input(s): DDIMER in the last 72 hours. -------------------------------------------------------------------------------------------------------------------  Cardiac Enzymes No results for input(s): CKMB, TROPONINI, MYOGLOBIN in the last 168 hours.  Invalid input(s): CK ------------------------------------------------------------------------------------------------------------------ Invalid input(s): POCBNP   ---------------------------------------------------------------------------------------------------------------  Urinalysis    Component Value Date/Time   COLORURINE YELLOW 09/05/2014 1143   APPEARANCEUR HAZY* 09/05/2014 1143   LABSPEC 1.021 09/05/2014 1143   PHURINE 5.5 09/05/2014 1143   GLUCOSEU NEGATIVE 09/05/2014 1143   HGBUR NEGATIVE 09/05/2014 1143   HGBUR negative 09/16/2009 0954   BILIRUBINUR NEGATIVE 09/05/2014 1143   KETONESUR NEGATIVE 09/05/2014 1143   PROTEINUR NEGATIVE 09/05/2014 1143   UROBILINOGEN 0.2 09/05/2014 1143   NITRITE NEGATIVE 09/05/2014 1143   LEUKOCYTESUR MODERATE* 09/05/2014 1143    ----------------------------------------------------------------------------------------------------------------  Imaging results:   Dg Chest 2 View  09/05/2014   CLINICAL DATA:  Altered mental status.  EXAM: CHEST  2 VIEW  COMPARISON:  March 25, 2014.  FINDINGS: The  heart size and mediastinal contours are within normal limits. Both lungs are clear. No pneumothorax or pleural effusion is noted. Status post right shoulder arthroplasty.  IMPRESSION: No active cardiopulmonary disease.   Electronically Signed   By: Marijo Conception, M.D.   On: 09/05/2014 12:33   Ct Head Wo Contrast  09/05/2014   CLINICAL DATA:  Altered mental status.  EXAM: CT HEAD WITHOUT CONTRAST  TECHNIQUE: Contiguous axial images were obtained from the base of the skull through the vertex without intravenous contrast.  COMPARISON:  CT scan of March 25, 2014.  FINDINGS: Bony calvarium appears intact. Mild diffuse cortical atrophy is noted. No mass effect or midline shift is noted. Ventricular size is within normal limits. There is no evidence of mass lesion, hemorrhage or acute infarction.  IMPRESSION: Mild diffuse cortical atrophy. No acute intracranial abnormality seen.   Electronically Signed   By: Marijo Conception, M.D.   On: 09/05/2014 13:09        Assessment & Plan  Active Problems:   Hypothyroidism   Hyperlipemia   CIGARETTE SMOKER   Schizoaffective disorder   Acute encephalopathy   Chronic  pain syndrome    Acute encephalopathy - This is most likely in the setting of polypharmacy, she appears to be on multiple sedative medication including gabapentin, Ativan, oxycodone, Benadryl, Skelaxin, Phenergan, as well her schizoaffective disorder medication including Cogentin, Effexor and Abilify. - Will decrease the dosage of Ativan and oxycodone at this point to prevent withdrawals, especially patient mentation back to baseline, will continue to hold Phenergan, Skelaxin, Benadryl and gabapentin. - Patient does not appear to be having any symptoms of serotonin syndrome, so we'll resume her back on Effexor and Abilify. - We'll start her on Rocephin to treat UTI, will repeat urinalysis, but given patient complains of dysuria but on treatment.  UTI - Continue with Rocephin, follow on  urine culture  Chronic pain syndrome - Please see above discussion  Schizoaffective disorder - Resume home medication  Cigarette smoker - Consult, patient reports she is significantly decreased her tobacco use.  Hypothyroidism - Continue with Synthroid, check TSH  Hyperlipidemia - Continue with statin   DVT Prophylaxis Heparin -   AM Labs Ordered, also please review Full Orders  Family Communication: Admission, patients condition and plan of care including tests being ordered have been discussed with the patient who indicate understanding and agree with the plan and Code Status.  Code Status FULL  Likely DC to  home when stable Condition GUARDED   Time spent in minutes : 60 minutes    Amyjo Mizrachi M.D on 09/05/2014 at 4:18 PM  Between 7am to 7pm - Pager - [phone]  After 7pm go to [email or web address] - password [email or web address]  And look for the night coverage person covering me after hours  [facility] Office  [phone]

## 2014-09-05 NOTE — ED Notes (Signed)
Pt reports having multiple falls in the last 3 days. Recently started taking oxycodone.

## 2014-09-06 DIAGNOSIS — E038 Other specified hypothyroidism: Secondary | ICD-10-CM

## 2014-09-06 DIAGNOSIS — G9340 Encephalopathy, unspecified: Principal | ICD-10-CM

## 2014-09-06 DIAGNOSIS — F258 Other schizoaffective disorders: Secondary | ICD-10-CM

## 2014-09-06 DIAGNOSIS — Z720 Tobacco use: Secondary | ICD-10-CM

## 2014-09-06 DIAGNOSIS — G894 Chronic pain syndrome: Secondary | ICD-10-CM

## 2014-09-06 LAB — BASIC METABOLIC PANEL
Anion gap: 10 (ref 5–15)
BUN: 11 mg/dL (ref 6–20)
CO2: 23 mmol/L (ref 22–32)
CREATININE: 0.75 mg/dL (ref 0.44–1.00)
Calcium: 9 mg/dL (ref 8.9–10.3)
Chloride: 106 mmol/L (ref 101–111)
GFR calc Af Amer: >60 mL/min (ref >60)
GFR calc non Af Amer: >60 mL/min (ref >60)
GLUCOSE: 148 mg/dL — AB (ref 65–99)
POTASSIUM: 4 mmol/L (ref 3.5–5.1)
SODIUM: 139 mmol/L (ref 135–145)

## 2014-09-06 LAB — CBC
HEMATOCRIT: 38.6 % (ref 36.0–46.0)
HEMOGLOBIN: 12.8 g/dL (ref 12.0–15.0)
MCH: 31.1 pg (ref 26.0–34.0)
MCHC: 33.2 g/dL (ref 30.0–36.0)
MCV: 93.9 fL (ref 78.0–100.0)
Platelets: 269 10*3/uL (ref 150–400)
RBC: 4.11 MIL/uL (ref 3.87–5.11)
RDW: 13.5 % (ref 11.5–15.5)
WBC: 9 10*3/uL (ref 4.0–10.5)

## 2014-09-06 MED ORDER — SULFAMETHOXAZOLE-TRIMETHOPRIM 800-160 MG PO TABS: 1.0000 | ORAL_TABLET | Freq: Two times a day (BID) | ORAL | Status: DC

## 2014-09-06 NOTE — Evaluation (Signed)
Physical Therapy Evaluation Patient Details Name: Dominique Acosta MRN: [id number] DOB: [birth year] Today's Date: 09/06/2014   History of Present Illness  Dominique Acosta is a 64 y.o. female, with past medical history of schizoaffective disorder, smoker, hypothyroidism, chronic pain syndrome, patient was brought by her aide, given altered mental status, as she has been acting abnormally over the last 24 hours, will aid reports patient had multiple falls over the last 3 days, initially in ED patient was confused and disoriented, could not provide any history, workup was significant for abnormal urinalysis(specimen was contaminated), CT head without acute findings, chest x-ray with no active cardiopulmonary disease, patient mental status started to improve while in ED  Clinical Impression  Pt pleasant but very hyper with quick speed and rapid change in train of thought throughout session. Pt reports frequent falls at home but unable to rationalize balance deficits stating it is related to dyskinesia and her arm. Attempted to educate pt for fall risk, use of increased DME such as RW and benefit of additional therapy however she denies all of these stating she is just here because of the cockroach spray and wants to go home. Pt is a moderate fall risk and will discontinue therapy per pt request with recommendation for daily mobility with nursing supervision.     Follow Up Recommendations Home health PT (pt declines)    Equipment Recommendations  None recommended by PT    Recommendations for Other Services       Precautions / Restrictions Precautions Precautions: Fall Precaution Comments: pt reports multiple falls but refused to guess how many in the last year      Mobility  Bed Mobility               General bed mobility comments: pt in chair on arrival  Transfers Overall transfer level: Independent                  Ambulation/Gait Ambulation/Gait assistance: Modified independent  (Device/Increase time) Ambulation Distance (Feet): 200 Feet Assistive device: Straight cane Gait Pattern/deviations: Step-through pattern;Decreased stride length   Gait velocity interpretation: at or above normal speed for age/gender General Gait Details: pt able to ambulate in halll without difficulty with cane but denied further distance stating "this hall reminds of my mother dying" and abruptly turned back to room   Stairs            Wheelchair Mobility    Modified Rankin (Stroke Patients Only)       Balance Overall balance assessment: Needs assistance;History of Falls   Sitting balance-Leahy Scale: Good       Standing balance-Leahy Scale: Fair                               Pertinent Vitals/Pain Pain Assessment: 0-10 Pain Score: 4  Pain Location: generalized soreness Pain Intervention(s): Repositioned    Home Living Family/patient expects to be discharged to:: Private residence Living Arrangements: Alone Available Help at Discharge: Personal care attendant Type of Home: Apartment Home Access: Elevator     Home Layout: One level Home Equipment: Cane - single point      Prior Function Level of Independence: Independent with assistive device(s)         Comments: pt states she uses a cane at times, pt states aide helps her with cooking and housework but that she can perform bathing and dressing on her own, pt states she can do her grocery shopping  Hand Dominance        Extremity/Trunk Assessment   Upper Extremity Assessment: Overall WFL for tasks assessed           Lower Extremity Assessment: Overall WFL for tasks assessed      Cervical / Trunk Assessment: Kyphotic  Communication   Communication: No difficulties  Cognition Arousal/Alertness: Awake/alert Behavior During Therapy: WFL for tasks assessed/performed Overall Cognitive Status: History of cognitive impairments - at baseline                       General Comments      Exercises        Assessment/Plan    PT Assessment Patient needs continued PT services  PT Diagnosis Altered mental status   PT Problem List Decreased balance;Decreased safety awareness  PT Treatment Interventions     PT Goals (Current goals can be found in the Care Plan section) Acute Rehab PT Goals PT Goal Formulation: All assessment and education complete, DC therapy    Frequency     Barriers to discharge Decreased caregiver support Pt could potentially benefit from further acute and HHPT for balance but pt states she doesn't want to do therapy anymore because she has been this way for 30 years and her arm is her only issue. Pt unable to realize and rationalize deficits    Co-evaluation               End of Session Equipment Utilized During Treatment: Gait belt Activity Tolerance: Patient tolerated treatment well Patient left: in chair;with call bell/phone within reach (no chair alarm available) Nurse Communication: Mobility status;Precautions         Time: 1497-0263 PT Time Calculation (min) (ACUTE ONLY): 17 min   Charges:   PT Evaluation $Initial PT Evaluation Tier I: 1 Procedure     PT G CodesMelford Aase 09/06/2014, 1:01 PM Elwyn Reach, [place]

## 2014-09-06 NOTE — Discharge Summary (Signed)
Physician Discharge Summary  Dominique Acosta LEX:[id number] DOB: Oct 18, 1950 DOA: 09/05/2014  PCP: Myriam Jacobson, MD  Admit date: 09/05/2014 Discharge date: 09/06/2014  Recommendations for Outpatient Follow-up:  1. Pt will need to follow up with PCP in 2 weeks post discharge 2. Please follow up on urine culture and adjust antibiotics as needed   Discharge Diagnoses:  acute encephalopathy -Likely due to polypharmacy -When questioned regarding her numerous opiate and hypnotic medications, the patient became very defensive and angry -Patient stated, "why do you need to know who gives me the narcotics?  You don't need to know.  I'm not going to tell you.  You don't even know my medical history" - patient firmly believes that her altered mental status was due to inhaling fumes from her cockroaches spray -Mental status at baseline -When I stated that she may need to decrease her opiate and hypnotic medications, patient stated, "you don't know my medical history.  I will go back to my own doctor" -Patient became more defensive when questioned regarding her past medical history. -Urinalysis shows pyuria suggestive of possible UTI -send pt home with Bactrim DS x 4 more days to complete 5 days of therapy -Patient refuses to stay in the hospital longer as she feels that her mental status is back to baseline Gait Instability -PT evaluation Pyuria -Please follow up on urine culture and adjust antibiotics as necessary Chronic pain syndrome -I informed the patient that I will not be providing any prescriptions for opiates or hypnotic medications -Follow up with primary care provider or the management physician Tobacco abuse -Patient has no desire to quit Schizoaffective disorder -Continue Effexor, Abilify, Cogentin Hyperlipidemia -Continue statin Hypothyroidism -Continue Synthroid  Discharge Condition: stable  Disposition:  home -  Diet: heart healthy Wt Readings from Last 3  Encounters:  09/05/14 81.4 kg (179 lb 7.3 oz)  03/25/14 74.844 kg (165 lb)  03/21/14 74.844 kg (165 lb)    History of present illness:  64 year old female with a history of schizoaffective disorder, chronic pain syndrome, continue tobacco abuse, and hyperlipidemia presented from home secondary to altered mental status for 24 hours. Initially, the patient was confused in the emergency department and was not able to provide history. Her home health aide activated EMS initially. Subsequently, the patient's mental status improved, but the patient was admitted for further workup and observation. In less than 24 hours, the patient's mental status returned to baseline. The patient was adamant about going home as she felt that there was nothing else to be done in the hospital. In addition, the patient was quite adamant that she did not want any of her opiate or hypnotic medications changed. She understood that no further prescriptions will be given to her at the time of discharge. During hospitalization, the patient received half of her home dose of oxycodone. I encouraged the patient to follow up with her primary care provider to discuss decreasing her opiate medications. The patient was quite defensive when there was discussion regarding her polypharmacy particularly her numerous opiate and hypnotic medications. She refused to reveal from whom she had been receiving her opiates and hypnotic medications.  The patient will be sent home with Bactrim for empiric treatment of UTI for 4 additional days.    Discharge Exam: Filed Vitals:   09/06/14 0840  BP: 102/47  Pulse: 83  Temp: 99.3 F (37.4 C)  Resp: 18   Filed Vitals:   09/05/14 1732 09/05/14 2121 09/06/14 0518 09/06/14 0840  BP: 128/67 122/61 125/58 102/47  Pulse: 86 87 85 83  Temp: 98.6 F (37 C) 99.5 F (37.5 C) 98.6 F (37 C) 99.3 F (37.4 C)  TempSrc: Oral Oral Oral Oral  Resp: 18 18 18 18   Weight:  81.4 kg (179 lb 7.3 oz)    SpO2: 97%  95% 97% 96%   General: A&O x 3, NAD, pleasant, cooperative Cardiovascular: RRR, no rub, no gallop, no S3 Respiratory: Diminished breath sounds at the bases without any wheezing. Abdomen:soft, nontender, nondistended, positive bowel sounds Extremities: No edema, No lymphangitis, no petechiae  Discharge Instructions     Medication List    STOP taking these medications        acetaminophen-codeine 300-30 MG per tablet  Commonly known as:  TYLENOL #3     diphenhydrAMINE 50 MG tablet  Commonly known as:  BENADRYL     promethazine 12.5 MG tablet  Commonly known as:  PHENERGAN     temazepam 30 MG capsule  Commonly known as:  RESTORIL      TAKE these medications        acyclovir 200 MG capsule  Commonly known as:  ZOVIRAX  Take 1 capsule by mouth 3 (three) times daily.     albuterol 90 MCG/ACT inhaler  Commonly known as:  PROVENTIL,VENTOLIN  Inhale 2 puffs into the lungs as directed. 1-2 puffs up to three times a day prn     ARIPiprazole 30 MG tablet  Commonly known as:  ABILIFY  Take 1 tablet by mouth daily.     aspirin 81 MG tablet  Take 81 mg by mouth daily.     benztropine 2 MG tablet  Commonly known as:  COGENTIN  Take 1 tablet by mouth 3 (three) times daily.     levothyroxine 112 MCG tablet  Commonly known as:  SYNTHROID, LEVOTHROID  Take 112 mcg by mouth daily.     LORazepam 1 MG tablet  Commonly known as:  ATIVAN  Take 1 tablet by mouth 3 (three) times daily as needed for anxiety.     NEURONTIN 800 MG tablet  Generic drug:  gabapentin  Take 800-1,600 mg by mouth 4 (four) times daily. 1 tab three times daily and 2 tablets at beditme.     Oxycodone HCl 10 MG Tabs  Take 2 tablets by mouth 4 (four) times daily.     simvastatin 80 MG tablet  Commonly known as:  ZOCOR  Take 0.5 tablets (40 mg total) by mouth at bedtime.     sulfamethoxazole-trimethoprim 800-160 MG per tablet  Commonly known as:  BACTRIM DS,SEPTRA DS  Take 1 tablet by mouth 2 (two)  times daily.     venlafaxine XR 150 MG 24 hr capsule  Commonly known as:  EFFEXOR-XR  Take 150 mg by mouth 2 (two) times daily. Pt takes 150mg  capsule in the morning and at noon, then takes 75mg  capsule at bedtime.         The results of significant diagnostics from this hospitalization (including imaging, microbiology, ancillary and laboratory) are listed below for reference.    Significant Diagnostic Studies: Dg Chest 2 View  09/05/2014   CLINICAL DATA:  Altered mental status.  EXAM: CHEST  2 VIEW  COMPARISON:  March 25, 2014.  FINDINGS: The heart size and mediastinal contours are within normal limits. Both lungs are clear. No pneumothorax or pleural effusion is noted. Status post right shoulder arthroplasty.  IMPRESSION: No active cardiopulmonary disease.   Electronically Signed   By: Marijo Conception, M.D.  On: 09/05/2014 12:33   Ct Head Wo Contrast  09/05/2014   CLINICAL DATA:  Altered mental status.  EXAM: CT HEAD WITHOUT CONTRAST  TECHNIQUE: Contiguous axial images were obtained from the base of the skull through the vertex without intravenous contrast.  COMPARISON:  CT scan of March 25, 2014.  FINDINGS: Bony calvarium appears intact. Mild diffuse cortical atrophy is noted. No mass effect or midline shift is noted. Ventricular size is within normal limits. There is no evidence of mass lesion, hemorrhage or acute infarction.  IMPRESSION: Mild diffuse cortical atrophy. No acute intracranial abnormality seen.   Electronically Signed   By: Marijo Conception, M.D.   On: 09/05/2014 13:09     Microbiology: Recent Results (from the past 240 hour(s))  Gram stain     Status: None   Collection Time: 09/05/14 11:43 AM  Result Value Ref Range Status   Specimen Description URINE, CLEAN CATCH  Final   Special Requests NONE  Final   Gram Stain   Final    WBC PRESENT,BOTH PMN AND MONONUCLEAR GRAM NEGATIVE RODS GRAM POSITIVE COCCI IN CHAINS CYTOSPIN SMEAR    Report Status 09/05/2014 FINAL   Final     Labs: Basic Metabolic Panel:  Recent Labs Lab 09/05/14 1125 09/06/14 0835  NA 141 139  K 4.3 4.0  CL 106 106  CO2 27 23  GLUCOSE 93 148*  BUN 11 11  CREATININE 0.81 0.75  CALCIUM 8.7* 9.0   Liver Function Tests:  Recent Labs Lab 09/05/14 1125  AST 25  ALT 22  ALKPHOS 67  BILITOT 0.3  PROT 6.6  ALBUMIN 3.5   No results for input(s): LIPASE, AMYLASE in the last 168 hours.  Recent Labs Lab 09/05/14 1125  AMMONIA 26   CBC:  Recent Labs Lab 09/05/14 1125 09/06/14 0501  WBC 9.2 9.0  NEUTROABS 5.9  --   HGB 12.6 12.8  HCT 40.2 38.6  MCV 96.4 93.9  PLT 290 269   Cardiac Enzymes: No results for input(s): CKTOTAL, CKMB, CKMBINDEX, TROPONINI in the last 168 hours. BNP: Invalid input(s): POCBNP CBG:  Recent Labs Lab 09/05/14 1136  GLUCAP 79    Time coordinating discharge:  Greater than 30 minutes  Signed:  Krishon Adkison, DO [facility] Pager: [phone] 09/06/2014, 10:46 AM

## 2014-09-06 NOTE — Progress Notes (Signed)
PT Cancellation Note  Patient Details Name: Dominique Acosta MRN: [id number] DOB: 23-Apr-1950   Cancelled Treatment:    Reason Eval/Treat Not Completed: Medical issues which prohibited therapy (pt currently on strict bedrest and unable to evaluate at this time. Await increased activity order)   Lanetta Inch [facility] 09/06/2014, 7:18 AM Elwyn Reach, [place]

## 2015-03-06 ENCOUNTER — Encounter (HOSPITAL_COMMUNITY): Payer: Self-pay

## 2015-03-06 ENCOUNTER — Emergency Department (HOSPITAL_COMMUNITY)
Admission: EM | Admit: 2015-03-06 | Discharge: 2015-03-07 | Disposition: A | Discharge status: Home / Self Care | Payer: Medicare Other | Attending: Emergency Medicine | Admitting: Emergency Medicine

## 2015-03-06 DIAGNOSIS — M79662 Pain in left lower leg: Secondary | ICD-10-CM | POA: Diagnosis not present

## 2015-03-06 DIAGNOSIS — Z880 Allergy status to penicillin: Secondary | ICD-10-CM | POA: Diagnosis not present

## 2015-03-06 DIAGNOSIS — F329 Major depressive disorder, single episode, unspecified: Secondary | ICD-10-CM | POA: Diagnosis not present

## 2015-03-06 DIAGNOSIS — E7800 Pure hypercholesterolemia, unspecified: Secondary | ICD-10-CM | POA: Diagnosis not present

## 2015-03-06 DIAGNOSIS — G8929 Other chronic pain: Secondary | ICD-10-CM | POA: Insufficient documentation

## 2015-03-06 DIAGNOSIS — G249 Dystonia, unspecified: Secondary | ICD-10-CM | POA: Insufficient documentation

## 2015-03-06 DIAGNOSIS — Z79899 Other long term (current) drug therapy: Secondary | ICD-10-CM | POA: Insufficient documentation

## 2015-03-06 DIAGNOSIS — R2242 Localized swelling, mass and lump, left lower limb: Secondary | ICD-10-CM | POA: Diagnosis not present

## 2015-03-06 DIAGNOSIS — Z7982 Long term (current) use of aspirin: Secondary | ICD-10-CM | POA: Diagnosis not present

## 2015-03-06 DIAGNOSIS — I252 Old myocardial infarction: Secondary | ICD-10-CM | POA: Diagnosis not present

## 2015-03-06 DIAGNOSIS — I2510 Atherosclerotic heart disease of native coronary artery without angina pectoris: Secondary | ICD-10-CM | POA: Insufficient documentation

## 2015-03-06 DIAGNOSIS — F17210 Nicotine dependence, cigarettes, uncomplicated: Secondary | ICD-10-CM | POA: Diagnosis not present

## 2015-03-06 DIAGNOSIS — Z856 Personal history of leukemia: Secondary | ICD-10-CM | POA: Diagnosis not present

## 2015-03-06 DIAGNOSIS — Z8781 Personal history of (healed) traumatic fracture: Secondary | ICD-10-CM | POA: Diagnosis not present

## 2015-03-06 DIAGNOSIS — J449 Chronic obstructive pulmonary disease, unspecified: Secondary | ICD-10-CM | POA: Diagnosis not present

## 2015-03-06 DIAGNOSIS — G40909 Epilepsy, unspecified, not intractable, without status epilepticus: Secondary | ICD-10-CM | POA: Insufficient documentation

## 2015-03-06 DIAGNOSIS — E039 Hypothyroidism, unspecified: Secondary | ICD-10-CM | POA: Diagnosis not present

## 2015-03-06 DIAGNOSIS — M7989 Other specified soft tissue disorders: Secondary | ICD-10-CM

## 2015-03-06 LAB — CBC WITH DIFFERENTIAL/PLATELET
BASOS PCT: 0 %
Basophils Absolute: 0 10*3/uL (ref 0.0–0.1)
EOS ABS: 0.3 10*3/uL (ref 0.0–0.7)
EOS PCT: 3 %
HCT: 39.2 % (ref 36.0–46.0)
Hemoglobin: 12.6 g/dL (ref 12.0–15.0)
Lymphocytes Relative: 27 %
Lymphs Abs: 2.6 10*3/uL (ref 0.7–4.0)
MCH: 30.2 pg (ref 26.0–34.0)
MCHC: 32.1 g/dL (ref 30.0–36.0)
MCV: 94 fL (ref 78.0–100.0)
Monocytes Absolute: 0.8 10*3/uL (ref 0.1–1.0)
Monocytes Relative: 8 %
NEUTROS PCT: 62 %
Neutro Abs: 6 10*3/uL (ref 1.7–7.7)
PLATELETS: 257 10*3/uL (ref 150–400)
RBC: 4.17 MIL/uL (ref 3.87–5.11)
RDW: 12.9 % (ref 11.5–15.5)
WBC: 9.7 10*3/uL (ref 4.0–10.5)

## 2015-03-06 LAB — BASIC METABOLIC PANEL
Anion gap: 6 (ref 5–15)
BUN: 17 mg/dL (ref 6–20)
CHLORIDE: 102 mmol/L (ref 101–111)
CO2: 28 mmol/L (ref 22–32)
CREATININE: 0.67 mg/dL (ref 0.44–1.00)
Calcium: 9 mg/dL (ref 8.9–10.3)
GFR calc non Af Amer: >60 mL/min (ref >60)
Glucose, Bld: 98 mg/dL (ref 65–99)
Potassium: 4.4 mmol/L (ref 3.5–5.1)
Sodium: 136 mmol/L (ref 135–145)

## 2015-03-06 LAB — CK: Total CK: 83 U/L (ref 38–234)

## 2015-03-06 LAB — D-DIMER, QUANTITATIVE: D-Dimer, Quant: 0.75 ug/mL-FEU — ABNORMAL HIGH (ref 0.00–0.50)

## 2015-03-06 MED ORDER — SULFAMETHOXAZOLE-TRIMETHOPRIM 800-160 MG PO TABS: 1.0000 | ORAL_TABLET | Freq: Two times a day (BID) | ORAL | Status: AC

## 2015-03-06 MED ORDER — ENOXAPARIN SODIUM 100 MG/ML ~~LOC~~ SOLN
90.0000 mg | Freq: Once | SUBCUTANEOUS | Status: AC
  Administered 2015-03-06: 90 mg via SUBCUTANEOUS
  Filled 2015-03-06: qty 1

## 2015-03-06 NOTE — ED Provider Notes (Signed)
CSN: [id number]     Arrival date & time 03/06/15  1310 History   First MD Initiated Contact with Patient 03/06/15 1904     Chief Complaint  Patient presents with  . 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 The patient states that yesterday she had a seizure. She states that sometimes she is post ictal state, lasting for 4 hours. She states she was on the floor for 4 hours. She estimates this because her sister was calling her phone for that long. She states that since that time she's had a lot of pain in her left leg running from the medial knee down to the foot. She complains also of heat, redness and swelling. Patient is concerned she may have a blood clot or an infection of some kind.  Past Medical History  Diagnosis Date  . Seizure ([place])   . Epilepsy ([place])   . Hypothyroid   . High cholesterol   . Arm pain   . Dystonia   . Depression   . Chronic pain   . Coronary artery disease   . Leukemia ([place])   . Myocardial infarct ([place])   . Humerus fracture   . COPD (chronic obstructive pulmonary disease) [facility])    Past Surgical History  Procedure Laterality Date  . Shoulder surgery    . Humerus fracture surgery    . External ear surgery     History reviewed. No pertinent family history. Social History  Substance Use Topics  . Smoking status: Current Every Day Smoker -- 2.00 packs/day for 41 years    Types: Cigarettes  . Smokeless tobacco: None  . Alcohol Use: No   OB History    No data available     Review of Systems  Ten systems reviewed and are negative for acute change, except as noted in the HPI.    Allergies  Amoxicillin; Penicillins; Phenytoin; Dilaudid; Haldol; and Morphine and related  Home Medications   Prior to Admission medications    Medication Sig Start Date End Date Taking? Authorizing Provider  acetaminophen-codeine (TYLENOL #3) 300-30 MG tablet Take 1 tablet by mouth 3 (three) times daily.    Yes Historical Provider, MD  acyclovir (ZOVIRAX) 200 MG capsule Take 1 capsule by mouth 2 (two) times daily.  03/10/14  Yes Historical Provider, MD  albuterol (PROVENTIL,VENTOLIN) 90 MCG/ACT inhaler Inhale 2 puffs into the lungs as directed. 1-2 puffs up to three times a day prn Patient taking differently: Inhale 1-2 puffs into the lungs 3 (three) times daily as needed for wheezing or shortness of breath. 1-2 puffs up to three times a day prn 11/22/10  Yes Todd D McDiarmid, MD  ARIPiprazole (ABILIFY) 15 MG tablet Take 15 mg by mouth daily.   Yes Historical Provider, MD  aspirin 81 MG tablet Take 81 mg by mouth daily.    Yes Historical Provider, MD  baclofen (LIORESAL) 10 MG tablet Take 10 mg by mouth 3 (three) times daily as needed for muscle spasms.   Yes Historical Provider, MD  benztropine (COGENTIN) 2 MG tablet Take 1 tablet by mouth 3 (three) times daily. 01/06/14  Yes Historical Provider, MD  bisacodyl (DULCOLAX) 5 MG EC tablet Take 5 mg by mouth daily as needed for moderate constipation.   Yes Historical Provider, MD  diphenhydrAMINE (BENADRYL) 25 mg capsule Take 25 mg by mouth every  6 (six) hours as needed for itching, allergies or sleep.   Yes Historical Provider, MD  docusate sodium (COLACE) 100 MG capsule Take 100 mg by mouth daily as needed for mild constipation or moderate constipation.   Yes Historical Provider, MD  gabapentin (NEURONTIN) 800 MG tablet Take 800-1,600 mg by mouth 4 (four) times daily. 1 tab three times daily and 2 tablets at beditme. 10/12/10  Yes Todd D McDiarmid, MD  levothyroxine (SYNTHROID, LEVOTHROID) 112 MCG tablet Take 112 mcg by mouth daily.     Yes Historical Provider, MD  LORazepam (ATIVAN) 1 MG tablet Take 1 tablet by mouth 3 (three) times daily as needed for anxiety.  03/12/14  Yes Historical  Provider, MD  Oxycodone HCl 10 MG TABS Take 1 tablet by mouth 4 (four) times daily.  03/12/14  Yes Historical Provider, MD  Simethicone (GAS-X PO) Take 1 capsule by mouth daily as needed (indigestion).   Yes Historical Provider, MD  simvastatin (ZOCOR) 80 MG tablet Take 0.5 tablets (40 mg total) by mouth at bedtime. Patient taking differently: Take 80 mg by mouth daily.  02/16/11  Yes Lyndee Hensen, MD  solifenacin (VESICARE) 10 MG tablet Take 10 mg by mouth daily as needed.   Yes Historical Provider, MD  temazepam (RESTORIL) 30 MG capsule Take 30 mg by mouth at bedtime.   Yes Historical Provider, MD  venlafaxine (EFFEXOR-XR) 150 MG 24 hr capsule Take 150 mg by mouth 2 (two) times daily. Pt takes 150mg  capsule in the morning and at noon, then takes 75mg  capsule at bedtime.   Yes Historical Provider, MD  venlafaxine XR (EFFEXOR-XR) 75 MG 24 hr capsule Take 75 mg by mouth at bedtime.    Yes Historical Provider, MD  sulfamethoxazole-trimethoprim (BACTRIM DS,SEPTRA DS) 800-160 MG per tablet Take 1 tablet by mouth 2 (two) times daily. Patient not taking: Reported on 03/06/2015 09/06/14   Orson Eva, MD   BP 126/70 mmHg  Pulse 77  Temp(Src) 98.3 F (36.8 C) (Oral)  Resp 18  SpO2 98% Physical Exam  Constitutional: She is oriented to person, place, and time. She appears well-developed and well-nourished. No distress.  HENT:  Head: Normocephalic and atraumatic.  Eyes: Conjunctivae are normal. No scleral icterus.  Neck: Normal range of motion.  Cardiovascular: Normal rate, regular rhythm, normal heart sounds and intact distal pulses.  Exam reveals no gallop and no friction rub.   No murmur heard. Left leg with TTP along the medial aspect. Swelling, tenderness. LLE is warm red and tender. Distal pulse intact BL.  Pulmonary/Chest: Effort normal and breath sounds normal. No respiratory distress.  Abdominal: Soft. Bowel sounds are normal. She exhibits no distension and no mass. There is no tenderness.  There is no guarding.  Neurological: She is alert and oriented to person, place, and time.  Skin: Skin is warm and dry. She is not diaphoretic.  Nursing note and vitals reviewed.   ED Course  Procedures (including critical care time) Labs Review Labs Reviewed  D-DIMER, QUANTITATIVE (NOT AT [facility]) - Abnormal; Notable for the following:    D-Dimer, Quant 0.75 (*)    All other components within normal limits  BASIC METABOLIC PANEL  CBC WITH DIFFERENTIAL/PLATELET  CK    Imaging Review No results found. I have personally reviewed and evaluated these images and lab results as part of my medical decision-making.   EKG Interpretation None      MDM   Final diagnoses:  Pain and swelling of left lower leg  Patient with left lower extremity edema, heat and redness. Does not appear to be infected, but I do have a concern for possible DVT after fall and extended period of time without movement. As we do not have ultrasound available at this time. We will order a d-dimer and her workup.   10:21 PM Patient labs show elevated D-Dimer. Negative CK and no concern for fracture. Labs are otherwise noncontributory. i have concern for dvt. lovenox given.  Op duplex in the morning. I discussed return precautions with the patient. Patient understands instructions for follow-up. He is safe for discharge at this time  Margarita Mail, PA-C 03/09/15 [place], MD 03/10/15 2213

## 2015-03-06 NOTE — Discharge Instructions (Signed)
Deep Vein Thrombosis °A deep vein thrombosis (DVT) is a blood clot (thrombus) that usually occurs in a deep, larger vein of the lower leg or the pelvis, or in an upper extremity such as the arm. These are dangerous and can lead to serious and even life-threatening complications if the clot travels to the lungs. °A DVT can damage the valves in your leg veins so that instead of flowing upward, the blood pools in the lower leg. This is called post-thrombotic syndrome, and it can result in pain, swelling, discoloration, and sores on the leg. °CAUSES °A DVT is caused by the formation of a blood clot in your leg, pelvis, or arm. Usually, several things contribute to the formation of blood clots. A clot may develop when: °· Your blood flow slows down. °· Your vein becomes damaged in some way. °· You have a condition that makes your blood clot more easily. °RISK FACTORS °A DVT is more likely to develop in: °· People who are older, especially over 60 years of age. °· People who are overweight (obese). °· People who sit or lie still for a long time, such as during long-distance travel (over 4 hours), bed rest, hospitalization, or during recovery from certain medical conditions like a stroke. °· People who do not engage in much physical activity (sedentary lifestyle). °· People who have chronic breathing disorders. °· People who have a personal or family history of blood clots or blood clotting disease. °· People who have peripheral vascular disease (PVD), diabetes, or some types of cancer. °· People who have heart disease, especially if the person had a recent heart attack or has congestive heart failure. °· People who have neurological diseases that affect the legs (leg paresis). °· People who have had a traumatic injury, such as breaking a hip or leg. °· People who have recently had major or lengthy surgery, especially on the hip, knee, or abdomen. °· People who have had a central line placed inside a large vein. °· People  who take medicines that contain the hormone estrogen. These include birth control pills and hormone replacement therapy. °· Pregnancy or during childbirth or the postpartum period. °· Long plane flights (over 8 hours). °SIGNS AND SYMPTOMS °Symptoms of a DVT can include:  °· Swelling of your leg or arm, especially if one side is much worse. °· Warmth and redness of your leg or arm, especially if one side is much worse. °· Pain in your arm or leg. If the clot is in your leg, symptoms may be more noticeable or worse when you stand or walk. °· A feeling of pins and needles, if the clot is in the arm. °The symptoms of a DVT that has traveled to the lungs (pulmonary embolism, PE) usually start suddenly and include: °· Shortness of breath while active or at rest. °· Coughing or coughing up blood or blood-tinged mucus. °· Chest pain that is often worse with deep breaths. °· Rapid or irregular heartbeat. °· Feeling light-headed or dizzy. °· Fainting. °· Feeling anxious. °· Sweating. °There may also be pain and swelling in a leg if that is where the blood clot started. °These symptoms may represent a serious problem that is an emergency. Do not wait to see if the symptoms will go away. Get medical help right away. Call your local emergency services (911 in the [place] Do not drive yourself to the hospital. °DIAGNOSIS °Your health care provider will take a medical history and perform a physical exam. You may also   have other tests, including: °· Blood tests to assess the clotting properties of your blood. °· Imaging tests, such as CT, ultrasound, MRI, X-ray, and other tests to see if you have clots anywhere in your body. °TREATMENT °After a DVT is identified, it can be treated. The type of treatment that you receive depends on many factors, such as the cause of your DVT, your risk for bleeding or developing more clots, and other medical conditions that you have. Sometimes, a combination of treatments is necessary. Treatment  options may be combined and include: °· Monitoring the blood clot with ultrasound. °· Taking medicines by mouth, such as newer blood thinners (anticoagulants), thrombolytics, or warfarin. °· Taking anticoagulant medicine by injection or through an IV tube. °· Wearing compression stockings or using different types of devices. °· Surgery (rare) to remove the blood clot or to place a filter in your abdomen to stop the blood clot from traveling to your lungs. °Treatments for a DVT are often divided into immediate treatment and long-term treatment (up to 3 months after DVT). You can work with your health care provider to choose the treatment program that is best for you. °HOME CARE INSTRUCTIONS °If you are taking a newer oral anticoagulant: °· Take the medicine every single day at the same time each day. °· Understand what foods and drugs interact with this medicine. °· Understand that there are no regular blood tests required when using this medicine. °· Understand the side effects of this medicine, including excessive bruising or bleeding. Ask your health care provider or pharmacist about other possible side effects. °If you are taking warfarin: °· Understand how to take warfarin and know which foods can affect how warfarin works in your body. °· Understand that it is dangerous to take too much or too little warfarin. Too much warfarin increases the risk of bleeding. Too little warfarin continues to allow the risk for blood clots. °· Follow your PT and INR blood testing schedule. The PT and INR results allow your health care provider to adjust your dose of warfarin. It is very important that you have your PT and INR tested as often as told by your health care provider. °· Avoid major changes in your diet, or tell your health care provider before you change your diet. Arrange a visit with a registered dietitian to answer your questions. Many foods, especially foods that are high in vitamin K, can interfere with warfarin  and affect the PT and INR results. Eat a consistent amount of foods that are high in vitamin K, such as: °¨ Spinach, kale, broccoli, cabbage, collard greens, turnip greens, Brussels sprouts, peas, cauliflower, seaweed, and parsley. °¨ Beef liver and pork liver. °¨ Green tea. °¨ Soybean oil. °· Tell your health care provider about any and all medicines, vitamins, and supplements that you take, including aspirin and other over-the-counter anti-inflammatory medicines. Be especially cautious with aspirin and anti-inflammatory medicines. Do not take those before you ask your health care provider if it is safe to do so. This is important because many medicines can interfere with warfarin and affect the PT and INR results. °· Do not start or stop taking any over-the-counter or prescription medicine unless your health care provider or pharmacist tells you to do so. °If you take warfarin, you will also need to do these things: °· Hold pressure over cuts for longer than usual. °· Tell your dentist and other health care providers that you are taking warfarin before you have any procedures in which   bleeding may occur. °· Avoid alcohol or drink very small amounts. Tell your health care provider if you change your alcohol intake. °· Do not use tobacco products, including cigarettes, chewing tobacco, and e-cigarettes. If you need help quitting, ask your health care provider. °· Avoid contact sports. °General Instructions °· Take over-the-counter and prescription medicines only as told by your health care provider. Anticoagulant medicines can have side effects, including easy bruising and difficulty stopping bleeding. If you are prescribed an anticoagulant, you will also need to do these things: °¨ Hold pressure over cuts for longer than usual. °¨ Tell your dentist and other health care providers that you are taking anticoagulants before you have any procedures in which bleeding may occur. °¨ Avoid contact sports. °· Wear a medical  alert bracelet or carry a medical alert card that says you have had a PE. °· Ask your health care provider how soon you can go back to your normal activities. Stay active to prevent new blood clots from forming. °· Make sure to exercise while traveling or when you have been sitting or standing for a long period of time. It is very important to exercise. Exercise your legs by walking or by tightening and relaxing your leg muscles often. Take frequent walks. °· Wear compression stockings as told by your health care provider to help prevent more blood clots from forming. °· Do not use tobacco products, including cigarettes, chewing tobacco, and e-cigarettes. If you need help quitting, ask your health care provider. °· Keep all follow-up appointments with your health care provider. This is important. °PREVENTION °Take these actions to decrease your risk of developing another DVT: °· Exercise regularly. For at least 30 minutes every day, engage in: °¨ Activity that involves moving your arms and legs. °¨ Activity that encourages good blood flow through your body by increasing your heart rate. °· Exercise your arms and legs every hour during long-distance travel (over 4 hours). Drink plenty of water and avoid drinking alcohol while traveling. °· Avoid sitting or lying in bed for long periods of time without moving your legs. °· Maintain a weight that is appropriate for your height. Ask your health care provider what weight is healthy for you. °· If you are a woman who is over 35 years of age, avoid unnecessary use of medicines that contain estrogen. These include birth control pills. °· Do not smoke, especially if you take estrogen medicines. If you need help quitting, ask your health care provider. °If you are hospitalized, prevention measures may include: °· Early walking after surgery, as soon as your health care provider says that it is safe. °· Receiving anticoagulants to prevent blood clots. If you cannot take  anticoagulants, other options may be available, such as wearing compression stockings or using different types of devices. °SEEK IMMEDIATE MEDICAL CARE IF: °· You have new or increased pain, swelling, or redness in an arm or leg. °· You have numbness or tingling in an arm or leg. °· You have shortness of breath while active or at rest. °· You have chest pain. °· You have a rapid or irregular heartbeat. °· You feel light-headed or dizzy. °· You cough up blood. °· You notice blood in your vomit, bowel movement, or urine. °These symptoms may represent a serious problem that is an emergency. Do not wait to see if the symptoms will go away. Get medical help right away. Call your local emergency services (911 in the [place] Do not drive yourself to the hospital. °  °  This information is not intended to replace advice given to you by your health care provider. Make sure you discuss any questions you have with your health care provider.   Document Released: 03/28/2005 Document Revised: 12/17/2014 Document Reviewed: 07/23/2014 Elsevier Interactive Patient Education 2016 Elsevier Inc[employer] Cellulitis Cellulitis is an infection of the skin and the tissue beneath it. The infected area is usually red and tender. Cellulitis occurs most often in the arms and lower legs.  CAUSES  Cellulitis is caused by bacteria that enter the skin through cracks or cuts in the skin. The most common types of bacteria that cause cellulitis are staphylococci and streptococci. SIGNS AND SYMPTOMS   Redness and warmth.  Swelling.  Tenderness or pain.  Fever. DIAGNOSIS  Your health care provider can usually determine what is wrong based on a physical exam. Blood tests may also be done. TREATMENT  Treatment usually involves taking an antibiotic medicine. HOME CARE INSTRUCTIONS   Take your antibiotic medicine as directed by your health care provider. Finish the antibiotic even if you start to feel better.  Keep the infected arm or leg  elevated to reduce swelling.  Apply a warm cloth to the affected area up to 4 times per day to relieve pain.  Take medicines only as directed by your health care provider.  Keep all follow-up visits as directed by your health care provider. SEEK MEDICAL CARE IF:   You notice red streaks coming from the infected area.  Your red area gets larger or turns dark in color.  Your bone or joint underneath the infected area becomes painful after the skin has healed.  Your infection returns in the same area or another area.  You notice a swollen bump in the infected area.  You develop new symptoms.  You have a fever. SEEK IMMEDIATE MEDICAL CARE IF:   You feel very sleepy.  You develop vomiting or diarrhea.  You have a general ill feeling (malaise) with muscle aches and pains.   This information is not intended to replace advice given to you by your health care provider. Make sure you discuss any questions you have with your health care provider.   Document Released: 01/05/2005 Document Revised: 12/17/2014 Document Reviewed: 06/13/2011 Elsevier Interactive Patient Education [employer].

## 2015-03-06 NOTE — ED Notes (Signed)
Per EMS, pt from home. Pt states she had seizure yesterday and fell twice.  HX of epilepsy and dystonia.  Pt c/o left leg pain shooting up leg and has worsened today.  Pt also states right foot is not "healing right".  Dressing on foot.  Vitals: 128/74, hr 86, resp 18, 96% ra

## 2015-03-06 NOTE — ED Notes (Signed)
Ms Widmark reports she doesn't have the key to get in her apartment and multiple calls have been placed to to her apartment maintinace to attempt to gain access for her to her apartment

## 2015-03-07 ENCOUNTER — Ambulatory Visit (HOSPITAL_COMMUNITY): Admission: RE | Admit: 2015-03-07 | Payer: Medicare Other | Source: Ambulatory Visit

## 2015-03-07 NOTE — ED Notes (Signed)
[facility] called for transport to home  ?

## 2015-03-09 ENCOUNTER — Ambulatory Visit (HOSPITAL_COMMUNITY)
Admission: RE | Admit: 2015-03-09 | Discharge: 2015-03-09 | Disposition: A | Discharge status: Home / Self Care | Payer: Medicare Other | Source: Ambulatory Visit | Attending: Emergency Medicine | Admitting: Emergency Medicine

## 2015-03-09 DIAGNOSIS — M79605 Pain in left leg: Secondary | ICD-10-CM | POA: Diagnosis present

## 2015-03-09 DIAGNOSIS — M79609 Pain in unspecified limb: Secondary | ICD-10-CM | POA: Diagnosis not present

## 2015-03-09 NOTE — Progress Notes (Signed)
VASCULAR LAB PRELIMINARY  PRELIMINARY  PRELIMINARY  PRELIMINARY  Left lower extremity venous duplex completed.    Preliminary report:  There is no DVT or SVT noted in the left lower extremity.   Aerilynn Goin, RVT 03/09/2015, 2:48 PM

## 2015-10-27 ENCOUNTER — Emergency Department (HOSPITAL_COMMUNITY): Payer: Commercial Managed Care - HMO

## 2015-10-27 ENCOUNTER — Encounter (HOSPITAL_COMMUNITY): Payer: Self-pay

## 2015-10-27 ENCOUNTER — Observation Stay (HOSPITAL_COMMUNITY)
Admission: EM | Admit: 2015-10-27 | Discharge: 2015-10-28 | Disposition: A | Discharge status: Home / Self Care | Payer: Commercial Managed Care - HMO | Attending: Emergency Medicine | Admitting: Emergency Medicine

## 2015-10-27 ENCOUNTER — Emergency Department (HOSPITAL_COMMUNITY)
Admission: EM | Admit: 2015-10-27 | Discharge: 2015-10-27 | Disposition: A | Discharge status: Home / Self Care | Payer: Commercial Managed Care - HMO | Attending: Emergency Medicine | Admitting: Emergency Medicine

## 2015-10-27 ENCOUNTER — Encounter (HOSPITAL_COMMUNITY): Payer: Self-pay | Admitting: *Deleted

## 2015-10-27 DIAGNOSIS — Z7982 Long term (current) use of aspirin: Secondary | ICD-10-CM | POA: Diagnosis not present

## 2015-10-27 DIAGNOSIS — F17210 Nicotine dependence, cigarettes, uncomplicated: Secondary | ICD-10-CM | POA: Insufficient documentation

## 2015-10-27 DIAGNOSIS — Z79899 Other long term (current) drug therapy: Secondary | ICD-10-CM | POA: Diagnosis not present

## 2015-10-27 DIAGNOSIS — I2510 Atherosclerotic heart disease of native coronary artery without angina pectoris: Secondary | ICD-10-CM | POA: Insufficient documentation

## 2015-10-27 DIAGNOSIS — F419 Anxiety disorder, unspecified: Secondary | ICD-10-CM | POA: Insufficient documentation

## 2015-10-27 DIAGNOSIS — J449 Chronic obstructive pulmonary disease, unspecified: Secondary | ICD-10-CM | POA: Insufficient documentation

## 2015-10-27 DIAGNOSIS — E7800 Pure hypercholesterolemia, unspecified: Secondary | ICD-10-CM | POA: Diagnosis not present

## 2015-10-27 DIAGNOSIS — E039 Hypothyroidism, unspecified: Secondary | ICD-10-CM | POA: Insufficient documentation

## 2015-10-27 DIAGNOSIS — G40909 Epilepsy, unspecified, not intractable, without status epilepticus: Secondary | ICD-10-CM | POA: Insufficient documentation

## 2015-10-27 DIAGNOSIS — Z7951 Long term (current) use of inhaled steroids: Secondary | ICD-10-CM | POA: Insufficient documentation

## 2015-10-27 DIAGNOSIS — W19XXXD Unspecified fall, subsequent encounter: Secondary | ICD-10-CM

## 2015-10-27 DIAGNOSIS — F259 Schizoaffective disorder, unspecified: Principal | ICD-10-CM | POA: Insufficient documentation

## 2015-10-27 DIAGNOSIS — I252 Old myocardial infarction: Secondary | ICD-10-CM | POA: Insufficient documentation

## 2015-10-27 DIAGNOSIS — E876 Hypokalemia: Secondary | ICD-10-CM | POA: Diagnosis not present

## 2015-10-27 DIAGNOSIS — D72829 Elevated white blood cell count, unspecified: Secondary | ICD-10-CM | POA: Diagnosis not present

## 2015-10-27 DIAGNOSIS — Z856 Personal history of leukemia: Secondary | ICD-10-CM | POA: Diagnosis not present

## 2015-10-27 DIAGNOSIS — R569 Unspecified convulsions: Secondary | ICD-10-CM

## 2015-10-27 HISTORY — DX: Schizophrenia, unspecified: F20.9

## 2015-10-27 LAB — COMPREHENSIVE METABOLIC PANEL
ALT: 20 U/L (ref 14–54)
ANION GAP: 11 (ref 5–15)
AST: 24 U/L (ref 15–41)
Albumin: 4.1 g/dL (ref 3.5–5.0)
Alkaline Phosphatase: 81 U/L (ref 38–126)
BUN: 18 mg/dL (ref 6–20)
CHLORIDE: 102 mmol/L (ref 101–111)
CO2: 24 mmol/L (ref 22–32)
CREATININE: 0.9 mg/dL (ref 0.44–1.00)
Calcium: 8.5 mg/dL — ABNORMAL LOW (ref 8.9–10.3)
Glucose, Bld: 106 mg/dL — ABNORMAL HIGH (ref 65–99)
POTASSIUM: 2.9 mmol/L — AB (ref 3.5–5.1)
SODIUM: 137 mmol/L (ref 135–145)
Total Bilirubin: 0.5 mg/dL (ref 0.3–1.2)
Total Protein: 7.3 g/dL (ref 6.5–8.1)

## 2015-10-27 LAB — URINE MICROSCOPIC-ADD ON: RBC / HPF: NONE SEEN RBC/hpf (ref 0–5)

## 2015-10-27 LAB — CBG MONITORING, ED: Glucose-Capillary: 106 mg/dL — ABNORMAL HIGH (ref 65–99)

## 2015-10-27 LAB — CBC WITH DIFFERENTIAL/PLATELET
BASOS ABS: 0.1 10*3/uL (ref 0.0–0.1)
Basophils Relative: 1 %
EOS PCT: 3 %
Eosinophils Absolute: 0.4 10*3/uL (ref 0.0–0.7)
HCT: 45.2 % (ref 36.0–46.0)
HEMOGLOBIN: 14.9 g/dL (ref 12.0–15.0)
LYMPHS PCT: 28 %
Lymphs Abs: 4.8 10*3/uL — ABNORMAL HIGH (ref 0.7–4.0)
MCH: 29.8 pg (ref 26.0–34.0)
MCHC: 33 g/dL (ref 30.0–36.0)
MCV: 90.4 fL (ref 78.0–100.0)
Monocytes Absolute: 1.4 10*3/uL — ABNORMAL HIGH (ref 0.1–1.0)
Monocytes Relative: 8 %
NEUTROS PCT: 60 %
Neutro Abs: 10.2 10*3/uL — ABNORMAL HIGH (ref 1.7–7.7)
PLATELETS: 335 10*3/uL (ref 150–400)
RBC: 5 MIL/uL (ref 3.87–5.11)
RDW: 13 % (ref 11.5–15.5)
WBC: 16.9 10*3/uL — AB (ref 4.0–10.5)

## 2015-10-27 LAB — RAPID URINE DRUG SCREEN, HOSP PERFORMED
Amphetamines: NOT DETECTED
Barbiturates: NOT DETECTED
Benzodiazepines: POSITIVE — AB
Cocaine: NOT DETECTED
OPIATES: POSITIVE — AB
Tetrahydrocannabinol: NOT DETECTED

## 2015-10-27 LAB — URINALYSIS, ROUTINE W REFLEX MICROSCOPIC
Glucose, UA: NEGATIVE mg/dL
Hgb urine dipstick: NEGATIVE
KETONES UR: NEGATIVE mg/dL
LEUKOCYTES UA: NEGATIVE
Nitrite: NEGATIVE
PROTEIN: 30 mg/dL — AB
Specific Gravity, Urine: 1.028 (ref 1.005–1.030)
pH: 5.5 (ref 5.0–8.0)

## 2015-10-27 LAB — ACETAMINOPHEN LEVEL

## 2015-10-27 LAB — ETHANOL

## 2015-10-27 LAB — SALICYLATE LEVEL

## 2015-10-27 MED ORDER — BACLOFEN 10 MG PO TABS
10.0000 mg | ORAL_TABLET | Freq: Three times a day (TID) | ORAL | Status: DC
  Administered 2015-10-27 – 2015-10-28 (×2): 10 mg via ORAL
  Filled 2015-10-27 (×3): qty 1

## 2015-10-27 MED ORDER — POTASSIUM CHLORIDE CRYS ER 20 MEQ PO TBCR
40.0000 meq | EXTENDED_RELEASE_TABLET | Freq: Once | ORAL | Status: AC
  Administered 2015-10-27: 40 meq via ORAL
  Filled 2015-10-27: qty 2

## 2015-10-27 NOTE — ED Notes (Signed)
[facility] called for patient transport home.  

## 2015-10-27 NOTE — ED Notes (Signed)
Per EMS, pt from home, reports seizure episode, pt was sitting in a chair upon arrival on scene.  EMS reports pt stated she fell on the glass table after a seizure episode, but the glass table is intact.  Pt's home is in disarray.  Pt is not post-ictal.

## 2015-10-27 NOTE — ED Notes (Signed)
Bed: WA02 Expected date:  Expected time:  Means of arrival:  Comments: EMS- elderly fall 

## 2015-10-27 NOTE — Progress Notes (Signed)
CSW was notified by Nurse CM to speak with patient at bedside due to possible disheveled home.  CSW spoke with patient at bedside. Patient was alert and oriented. Patient informed CSW that she lives in a senior apartment called [facility]. She states that she has been staying there for almost 20 years. Patient reports that she feels as though she does not have a good support system. She states that her home is messy, but says that she cleans it when she is able. She states that sometimes she can clean it and sometimes he cannot. She states that she cannot afford a maid. Patient states that she has a fixed income.  Patient informed CSW that she has fallen x5 within the past 6 months. Patient states that she needs assistance with ADL's. Per note, patient was given home health agencies to follow up.  Patient states that she has air, heat, food and water at home.   Willette Brace [id number] ED CSW 10/27/2015 11:10 PM

## 2015-10-27 NOTE — Care Management Note (Signed)
Case Management Note  Patient Details  Name: Dominique Acosta MRN: [id number] Date of Birth: [birth year]  Subjective/Objective:   Patient presents to Ed with seizure.  EDCM consulted to speak to patient regarding home health services.         Action/Plan:  EDCM spoke to patient at bedside.  Patient reports she lives alone.  Patient confirms her pcp is Dr. Lorene Dy.  She reports she was supposed to have an appointment with him today but she had a seizure and came to the ED.  Patient reports she was approved for 57.5 hours for private aide services but reports she is "between aides" right now.  She reports her aides are "Through the state."  [facility] provided patient with list of private duty nursing agencies.  Patient also reports she would be interested in meals on wheels as she has difficulty getting to the grocery store. She reports she was also active with an ACT team in the community through [place], but she no longer is.  She reports she doesn't understand why, "they dropped me."  Patient is agreeable to home health RN for medication management and social work for above noted reasons.  EDCM provided patient with list of home health agencies in [facility] of which she has chosen [facility].  EDCM informed Rhett Bannister of  [facility] of home health referral and patient's needs.  Discussed with EDP.  Home health orders for home health RN and SW placed.  No further EDCM needs at this time.   Expected Discharge Date:                  Expected Discharge Plan:  [place]  In-House Referral:   social work  Discharge planning Services  CM Consult  Post Acute Care Choice:  Home Health Choice offered to:  Patient  DME Arranged:    DME Agency:  [facility]  HH Arranged:  RN, Social Work [employer] Agency:     Status of Service:  Completed, signed off  If discussed at [employer], dates discussed:    Additional CommentsLivia Snellen, RN 10/27/2015, 4:23 PM

## 2015-10-27 NOTE — Discharge Instructions (Signed)
It was our pleasure to provide your ER care today - we hope that you feel better.  Follow up with your primary care doctor in the coming week.  No driving until cleared to do so by your doctor.  We have made a home health referral that our case manager talk with you about - you should hear from them tomorrow.   Return to ER if worse, new symptoms, fevers, trouble breathing, recurrent seizures, or other medical emergency.       Seizure, Adult A seizure is abnormal electrical activity in the brain. Seizures usually last from 30 seconds to 2 minutes. There are various types of seizures. Before a seizure, you may have a warning sensation (aura) that a seizure is about to occur. An aura may include the following symptoms:   Fear or anxiety.  Nausea.  Feeling like the room is spinning (vertigo).  Vision changes, such as seeing flashing lights or spots. Common symptoms during a seizure include:  A change in attention or behavior (altered mental status).  Convulsions with rhythmic jerking movements.  Drooling.  Rapid eye movements.  Grunting.  Loss of bladder and bowel control.  Bitter taste in the mouth.  Tongue biting. After a seizure, you may feel confused and sleepy. You may also have an injury resulting from convulsions during the seizure. HOME CARE INSTRUCTIONS   If you are given medicines, take them exactly as prescribed by your health care provider.  Keep all follow-up appointments as directed by your health care provider.  Do not swim or drive or engage in risky activity during which a seizure could cause further injury to you or others until your health care provider says it is OK.  Get adequate rest.  Teach friends and family what to do if you have a seizure. They should:  Lay you on the ground to prevent a fall.  Put a cushion under your head.  Loosen any tight clothing around your neck.  Turn you on your side. If vomiting occurs, this helps keep your  airway clear.  Stay with you until you recover.  Know whether or not you need emergency care. SEEK IMMEDIATE MEDICAL CARE IF:  The seizure lasts longer than 5 minutes.  The seizure is severe or you do not wake up immediately after the seizure.  You have an altered mental status after the seizure.  You are having more frequent or worsening seizures. Someone should drive you to the emergency department or call local emergency services (911 in [place] MAKE SURE YOU:  Understand these instructions.  Will watch your condition.  Will get help right away if you are not doing well or get worse.   This information is not intended to replace advice given to you by your health care provider. Make sure you discuss any questions you have with your health care provider.   Document Released: 03/25/2000 Document Revised: 04/18/2014 Document Reviewed: 11/07/2012 Elsevier Interactive Patient Education 2016 [employer].    Nonepileptic Seizures Nonepileptic seizures are seizures that are not caused by abnormal electrical signals in your brain. These seizures often seem like epileptic seizures, but they are not caused by epilepsy.  There are two types of nonepileptic seizures:  A physiologic nonepileptic seizure results from a disruption in your brain.  A psychogenic seizure results from emotional stress. These seizures are sometimes called pseudoseizures. CAUSES  Causes of physiologic nonepileptic seizures include:   Sudden drop in blood pressure.  Low blood sugar.  Low levels of salt (  sodium) in your blood.  Low levels of calcium in your blood.  Migraine.  Heart rhythm problems.  Sleep disorders.  Drug and alcohol abuse. Common causes of psychogenic nonepileptic seizures include:  Stress.  Emotional trauma.  Sexual or physical abuse.  Major life events, such as divorce or the death of a loved one.  Mental health disorders, including panic attack and hyperactivity  disorder. SIGNS AND SYMPTOMS A nonepileptic seizure can look like an epileptic seizure, including uncontrollable shaking (convulsions), or changes in attention, behavior, or the ability to remain awake and alert. However, there are some differences. Nonepileptic seizures usually:  Do not cause physical injuries.  Start slowly.  Include crying or shrieking.  Last longer than 2 minutes.  Have a short recovery time without headache or exhaustion. DIAGNOSIS  Your health care provider can usually diagnose nonepileptic seizures after taking your medical history and giving you a physical exam. Your health care provider may want to talk to your friends or relatives who have seen you have a seizure.  You may also need to have tests to look for causes of physiologic nonepileptic seizures. This may include an electroencephalogram (EEG), which is a test that measures electrical activity in your brain. If you have had an epileptic seizure, the results of your EEG will be abnormal. If your health care provider thinks you have had a psychogenic nonepileptic seizure, you may need to see a mental health specialist for an evaluation. TREATMENT  Treatment depends on the type and cause of your seizures.  For physiologic nonepileptic seizures, treatment is aimed at addressing the underlying condition that caused the seizures. These seizures usually stop when the underlying condition is properly treated.  Nonepileptic seizures do not respond to the seizure medicines used to treat epilepsy.  For psychogenic seizures, you may need to work with a mental health specialist. [place] care will depend on the type of nonepileptic seizures you have.   Follow all your health care provider's instructions.  Keep all your follow-up appointments. SEEK MEDICAL CARE IF: You continue to have seizures after treatment. SEEK IMMEDIATE MEDICAL CARE IF:  Your seizures change or become more frequent.  You  injure yourself during a seizure.  You have one seizure after another.  You have trouble recovering from a seizure.  You have chest pain or trouble breathing. MAKE SURE YOU:  Understand these instructions.  Will watch your condition.  Will get help right away if you are not doing well or get worse.   This information is not intended to replace advice given to you by your health care provider. Make sure you discuss any questions you have with your health care provider.   Document Released: 05/13/2005 Document Revised: 04/18/2014 Document Reviewed: 01/22/2013 Elsevier Interactive Patient Education [employer].

## 2015-10-27 NOTE — ED Notes (Signed)
Bed: [id number] Expected date:  Expected time:  Means of arrival:  Comments: EMS [phone] F fall / eval this am / can't take care of self

## 2015-10-27 NOTE — ED Notes (Signed)
Patient provided with blanket and offered drink.

## 2015-10-27 NOTE — ED Notes (Signed)
Pt states she gets an aura prior to seizure episode.  States she takes neurontin for her seizure.  Pt is A&O x 4 at present.  Pt is asking for juice to drink, c/o dry mouth.

## 2015-10-27 NOTE — ED Notes (Signed)
Pt arrives to the ER via EMS for complaints of generalized pain after fall this am; pt states that she is too scared and in too much pain to be home alone; pt seen and evaluated in the ER earlier today for fall and possible seizure; pt saw Case Management and was given Home Health agencies for follow up; no agencies available to see pt tonight and she does not want to be left home alone; no new fall or new seizure

## 2015-10-27 NOTE — ED Provider Notes (Signed)
CSN: [id number]     Arrival date & time 10/27/15  2006 History   First MD Initiated Contact with Patient 10/27/15 2038     Chief Complaint  Patient presents with  . Generalized Pain    . Fall   HPI  Dominique Acosta is a 65 year old female with past medical history of schizophrenia, COPD, CAD, seizure disorder presenting after a fall. Patient was evaluated in the emergency department earlier with a seizure causing a fall. Chart reviewed and patient has a very unclear seizure history. She is unable to fully describe her seizures. She states that she returns because she "forgot to tell the first doctor that she was having amnesia ". Patient is extremely poor historian and often tangential in speech. She states that she is forgetting how to use the phone and she can't remember whether or not her sister is dead. She asks "is it possible to be conscious and dreaming". Is unable to explain why she asked this question. She endorses increased anxiety. Denies recurrent fall or seizure like activity since last discharge. Denies pain but states she is concerned because nobody x-rayed her back. Denies new complaints. States that she remembers seeing case management and discussing home health options earlier today. States that she could not be home alone so she returned to ED. Pt states "don't put me back in that psych area". Denies fevers, chills, URI symptoms, cough, SOB, abdominal pain, nausea, vomiting, dysuria, flank pain or rashes.   Past Medical History  Diagnosis Date  . Seizure ([place])   . Epilepsy ([place])   . Hypothyroid   . High cholesterol   . Arm pain   . Dystonia   . Depression   . Chronic pain   . Coronary artery disease   . Leukemia ([place])   . Myocardial infarct ([place])   . Humerus fracture   . COPD (chronic obstructive pulmonary disease) ([place])   . Schizophrenia [facility])    Past Surgical History  Procedure Laterality Date  . Shoulder surgery    . Humerus fracture surgery    . External ear surgery      No family history on file. Social History  Substance Use Topics  . Smoking status: Current Every Day Smoker -- 2.00 packs/day for 41 years    Types: Cigarettes  . Smokeless tobacco: None  . Alcohol Use: No   OB History    No data available     Review of Systems  All other systems reviewed and are negative.     Allergies  Amoxicillin; Penicillins; Phenytoin; Dilaudid; Haldol; and Morphine and related  Home Medications   Prior to Admission medications   Medication Sig Start Date End Date Taking? Authorizing Provider  acetaminophen-codeine (TYLENOL #3) 300-30 MG tablet Take 1 tablet by mouth 3 (three) times daily.    Yes Historical Provider, MD  albuterol (PROVENTIL,VENTOLIN) 90 MCG/ACT inhaler Inhale 2 puffs into the lungs as directed. 1-2 puffs up to three times a day prn Patient taking differently: Inhale 1-2 puffs into the lungs 3 (three) times daily as needed for wheezing or shortness of breath. 1-2 puffs up to three times a day prn 11/22/10  Yes Todd D McDiarmid, MD  ARIPiprazole (ABILIFY) 30 MG tablet Take 30 mg by mouth daily.  08/11/15  Yes Historical Provider, MD  aspirin 81 MG tablet Take 81 mg by mouth daily.    Yes Historical Provider, MD  baclofen (LIORESAL) 10 MG tablet Take 10 mg by mouth 2 (two) times daily as  needed for muscle spasms.  09/13/15  Yes Historical Provider, MD  benztropine (COGENTIN) 2 MG tablet Take 1 tablet by mouth 3 (three) times daily. 01/06/14  Yes Historical Provider, MD  gabapentin (NEURONTIN) 800 MG tablet Take 800-1,600 mg by mouth 4 (four) times daily. 1 tab three times daily and 2 tablets at beditme. 10/12/10  Yes Todd D McDiarmid, MD  levothyroxine (SYNTHROID, LEVOTHROID) 112 MCG tablet Take 112 mcg by mouth daily.     Yes Historical Provider, MD  Oxycodone HCl 10 MG TABS Take 1 tablet by mouth 4 (four) times daily.  03/12/14  Yes Historical Provider, MD  simvastatin (ZOCOR) 40 MG tablet Take 40 mg by mouth daily.   Yes Historical Provider, MD   temazepam (RESTORIL) 30 MG capsule Take 30 mg by mouth at bedtime.   Yes Historical Provider, MD  venlafaxine (EFFEXOR-XR) 150 MG 24 hr capsule Take 150 mg by mouth 2 (two) times daily. Reported on 10/27/2015   Yes Historical Provider, MD  bisacodyl (DULCOLAX) 5 MG EC tablet Take 5 mg by mouth daily as needed for moderate constipation.    Historical Provider, MD  diphenhydrAMINE (BENADRYL) 25 mg capsule Take 25 mg by mouth every 6 (six) hours as needed for itching, allergies or sleep.    Historical Provider, MD  docusate sodium (COLACE) 100 MG capsule Take 100 mg by mouth daily as needed for mild constipation or moderate constipation.    Historical Provider, MD  LORazepam (ATIVAN) 1 MG tablet Take 1 tablet by mouth 3 (three) times daily as needed for anxiety.  03/12/14   Historical Provider, MD  simvastatin (ZOCOR) 80 MG tablet Take 0.5 tablets (40 mg total) by mouth at bedtime. Patient not taking: Reported on 10/27/2015 02/16/11   Lyndee Hensen, MD   BP 118/78 mmHg  Pulse 78  Temp(Src) 98.6 F (37 C) (Oral)  Resp 18  Ht 5' (1.524 m)  Wt 79.379 kg  BMI 34.18 kg/m2  SpO2 98% Physical Exam  Constitutional: She is oriented to person, place, and time. She appears well-developed and well-nourished. No distress.  HENT:  Head: Normocephalic and atraumatic.  Eyes: Conjunctivae are normal. Right eye exhibits no discharge. Left eye exhibits no discharge. No scleral icterus.  Neck: Normal range of motion.  Cardiovascular: Normal rate, regular rhythm and normal heart sounds.   Pulmonary/Chest: Effort normal and breath sounds normal. No respiratory distress. She has no wheezes.  Abdominal: Soft. Bowel sounds are normal. She exhibits no distension. There is no tenderness.  Musculoskeletal: Normal range of motion.  Neurological: She is alert and oriented to person, place, and time. Coordination normal.  Oriented with clear speech. Cranial nerves intact. Strength 5/5 in all major muscle groups. Sensation  to light touch grossly intact. No ataxia.   Skin: Skin is warm and dry. No rash noted.  No rashes  Psychiatric: She has a normal mood and affect. Her behavior is normal. Her speech is tangential.  Speech is frequently tangential and pt rambles. Endorses increased anxiety recently. No SI or HI. Reports "amnesia" but is able to recall all the events of today.   Nursing note and vitals reviewed.   ED Course  Procedures (including critical care time) Labs Review Labs Reviewed  COMPREHENSIVE METABOLIC PANEL - Abnormal; Notable for the following:    Potassium 2.9 (*)    Glucose, Bld 106 (*)    Calcium 8.5 (*)    All other components within normal limits  CBC WITH DIFFERENTIAL/PLATELET - Abnormal; Notable for the  following:    WBC 16.9 (*)    Neutro Abs 10.2 (*)    Lymphs Abs 4.8 (*)    Monocytes Absolute 1.4 (*)    All other components within normal limits  URINE RAPID DRUG SCREEN, HOSP PERFORMED - Abnormal; Notable for the following:    Opiates POSITIVE (*)    Benzodiazepines POSITIVE (*)    All other components within normal limits  ACETAMINOPHEN LEVEL - Abnormal; Notable for the following:    Acetaminophen (Tylenol), Serum <10 (*)    All other components within normal limits  URINALYSIS, ROUTINE W REFLEX MICROSCOPIC (NOT AT [facility]) - Abnormal; Notable for the following:    Color, Urine AMBER (*)    Bilirubin Urine SMALL (*)    Protein, ur 30 (*)    All other components within normal limits  URINE MICROSCOPIC-ADD ON - Abnormal; Notable for the following:    Squamous Epithelial / LPF 0-5 (*)    Bacteria, UA FEW (*)    Casts HYALINE CASTS (*)    All other components within normal limits  ACETAMINOPHEN LEVEL - Abnormal; Notable for the following:    Acetaminophen (Tylenol), Serum <10 (*)    All other components within normal limits  ETHANOL  SALICYLATE LEVEL  ETHANOL  SALICYLATE LEVEL    Imaging Review Dg Chest 2 View  10/27/2015  CLINICAL DATA:  Leukocytosis. EXAM: CHEST  2  VIEW COMPARISON:  Sep 05, 2014 FINDINGS: Patient is status post right shoulder replacement. The heart, hila, mediastinum, lungs, and pleura are normal. No acute abnormalities. IMPRESSION: No active cardiopulmonary disease. Electronically Signed   By: Dorise Bullion III M.D   On: 10/27/2015 23:10   Dg Thoracic Spine 2 View  10/27/2015  CLINICAL DATA:  Pain after fall EXAM: THORACIC SPINE 2 VIEWS COMPARISON:  None. FINDINGS: There is no evidence of thoracic spine fracture. Alignment is normal. No other significant bone abnormalities are identified. IMPRESSION: Negative. Electronically Signed   By: Dorise Bullion III M.D   On: 10/27/2015 22:31   Dg Lumbar Spine Complete  10/27/2015  CLINICAL DATA:  Pain after fall EXAM: LUMBAR SPINE - COMPLETE 4+ VIEW COMPARISON:  None. FINDINGS: No fracture or malalignment. Atherosclerotic change seen in the distal aorta. IMPRESSION: Atherosclerosis in the distal aorta. No fracture or traumatic malalignment. Electronically Signed   By: Dorise Bullion III M.D   On: 10/27/2015 22:32   Ct Head Wo Contrast  10/27/2015  CLINICAL DATA:  Fall this morning. Initial encounter. EXAM: CT HEAD WITHOUT CONTRAST TECHNIQUE: Contiguous axial images were obtained from the base of the skull through the vertex without intravenous contrast. COMPARISON:  09/05/2014 FINDINGS: Brain: No evidence of acute infarction, hemorrhage, hydrocephalus, or mass lesion/mass effect. Stable mild generalized cortical volume loss. Vascular: Atherosclerotic calcification of the carotid siphons. Skull: Negative for fracture or focal lesion. Sinuses/Orbits: Chronic partial opacification of air cells in the left mastoid tip. Right cataract resection. Other: None. IMPRESSION: No acute finding or change since 2016. Electronically Signed   By: Monte Fantasia M.D.   On: 10/27/2015 22:13   I have personally reviewed and evaluated these images and lab results as part of my medical decision-making.   EKG  Interpretation None      MDM   Final diagnoses:  Schizoaffective disorder, unspecified type [facility])   65 year old female presenting today stating she can not take care of herself at home. Seen earlier today for seizure and fall with discharge home with home health resources. Returns stating "  I forgot to tell the doctor I had amnesia". No other new complaints. Pt is tangential in speech and often rambling. She is very poor historian and frequently states that she cannot go home. Unsure about psych meds and if she is compliant. Non-focal neuro exam. Leukocytosis on CBC. Again re-interviewed pt who denies infectious symptoms. No findings on CXR or UA. No rashes. No abdominal TTP. No meningeal signs. No not believe leukocytosis is infectious at this time. Hypokalemia which was repleted in ED. Urine positive for opiates and benzos. Imaging of spine negative. CT head negative. Pt will be monitored overnight and evaluated by psych in the morning for possible worsening schizoaffective that is now interfering with ADLs. Discussed patient with Dr. Oleta Mouse who agrees with this plan. Pt cleared for psych hold.     Lahoma Crocker Dia Jefferys, PA-C 10/28/15 [id number]  Forde Dandy, MD 10/28/15 [id number]

## 2015-10-27 NOTE — ED Provider Notes (Signed)
CSN: [id number]     Arrival date & time 10/27/15  1433 History   First MD Initiated Contact with Patient 10/27/15 1436     Chief Complaint  Patient presents with  . Seizures     (Consider location/radiation/quality/duration/timing/severity/associated sxs/prior Treatment) Patient is a 65 y.o. female presenting with seizures. The history is provided by the patient.  Seizures Patient indicates she has history of seizure, and thinks she may have had seizure.   Patient called ems, was sitting in chair when they arrived, alert, not postictal. Patient very poor historian - does not answer many questions, tangential answers to others.  Patient asks whether she needs to increase her neurontin dose. Patient does appear anxious. Denies specific pain or injury. Denies sleep deprivation. No current or recent fever or chills. Normal po intake, normal appetite. No gu c/o. No nvd. No uri c/o. No chest pain or palpitations. No sob. Denies recent change in meds.       Past Medical History  Diagnosis Date  . Seizure ([place])   . Epilepsy ([place])   . Hypothyroid   . High cholesterol   . Arm pain   . Dystonia   . Depression   . Chronic pain   . Coronary artery disease   . Leukemia ([place])   . Myocardial infarct ([place])   . Humerus fracture   . COPD (chronic obstructive pulmonary disease) ([place])   . Schizophrenia [facility])    Past Surgical History  Procedure Laterality Date  . Shoulder surgery    . Humerus fracture surgery    . External ear surgery     No family history on file. Social History  Substance Use Topics  . Smoking status: Current Every Day Smoker -- 2.00 packs/day for 41 years    Types: Cigarettes  . Smokeless tobacco: None  . Alcohol Use: No   OB History    No data available     Review of Systems  Constitutional: Negative for fever and chills.  HENT: Negative for sore throat.   Eyes: Negative for visual disturbance.  Respiratory: Negative for shortness of breath.   Cardiovascular:  Negative for chest pain, palpitations and leg swelling.  Gastrointestinal: Negative for vomiting, abdominal pain and diarrhea.  Genitourinary: Negative for dysuria and flank pain.  Musculoskeletal: Negative for back pain and neck pain.  Skin: Negative for rash.  Neurological: Positive for seizures. Negative for weakness, numbness and headaches.  Hematological: Does not bruise/bleed easily.  Psychiatric/Behavioral: Negative for confusion.      Allergies  Amoxicillin; Penicillins; Phenytoin; Dilaudid; Haldol; and Morphine and related  Home Medications   Prior to Admission medications   Medication Sig Start Date End Date Taking? Authorizing Provider  acetaminophen-codeine (TYLENOL #3) 300-30 MG tablet Take 1 tablet by mouth 3 (three) times daily.     Historical Provider, MD  acyclovir (ZOVIRAX) 200 MG capsule Take 1 capsule by mouth 2 (two) times daily.  03/10/14   Historical Provider, MD  albuterol (PROVENTIL,VENTOLIN) 90 MCG/ACT inhaler Inhale 2 puffs into the lungs as directed. 1-2 puffs up to three times a day prn Patient taking differently: Inhale 1-2 puffs into the lungs 3 (three) times daily as needed for wheezing or shortness of breath. 1-2 puffs up to three times a day prn 11/22/10   Blane Ohara McDiarmid, MD  ARIPiprazole (ABILIFY) 15 MG tablet Take 15 mg by mouth daily.    Historical Provider, MD  aspirin 81 MG tablet Take 81 mg by mouth daily.  Historical Provider, MD  baclofen (LIORESAL) 10 MG tablet Take 10 mg by mouth 3 (three) times daily as needed for muscle spasms.    Historical Provider, MD  benztropine (COGENTIN) 2 MG tablet Take 1 tablet by mouth 3 (three) times daily. 01/06/14   Historical Provider, MD  bisacodyl (DULCOLAX) 5 MG EC tablet Take 5 mg by mouth daily as needed for moderate constipation.    Historical Provider, MD  diphenhydrAMINE (BENADRYL) 25 mg capsule Take 25 mg by mouth every 6 (six) hours as needed for itching, allergies or sleep.    Historical Provider,  MD  docusate sodium (COLACE) 100 MG capsule Take 100 mg by mouth daily as needed for mild constipation or moderate constipation.    Historical Provider, MD  gabapentin (NEURONTIN) 800 MG tablet Take 800-1,600 mg by mouth 4 (four) times daily. 1 tab three times daily and 2 tablets at beditme. 10/12/10   Blane Ohara McDiarmid, MD  levothyroxine (SYNTHROID, LEVOTHROID) 112 MCG tablet Take 112 mcg by mouth daily.      Historical Provider, MD  LORazepam (ATIVAN) 1 MG tablet Take 1 tablet by mouth 3 (three) times daily as needed for anxiety.  03/12/14   Historical Provider, MD  Oxycodone HCl 10 MG TABS Take 1 tablet by mouth 4 (four) times daily.  03/12/14   Historical Provider, MD  Simethicone (GAS-X PO) Take 1 capsule by mouth daily as needed (indigestion).    Historical Provider, MD  simvastatin (ZOCOR) 80 MG tablet Take 0.5 tablets (40 mg total) by mouth at bedtime. Patient taking differently: Take 80 mg by mouth daily.  02/16/11   Lyndee Hensen, MD  solifenacin (VESICARE) 10 MG tablet Take 10 mg by mouth daily as needed.    Historical Provider, MD  temazepam (RESTORIL) 30 MG capsule Take 30 mg by mouth at bedtime.    Historical Provider, MD  venlafaxine (EFFEXOR-XR) 150 MG 24 hr capsule Take 150 mg by mouth 2 (two) times daily. Pt takes 150mg  capsule in the morning and at noon, then takes 75mg  capsule at bedtime.    Historical Provider, MD  venlafaxine XR (EFFEXOR-XR) 75 MG 24 hr capsule Take 75 mg by mouth at bedtime.     Historical Provider, MD   BP 129/70 mmHg  Pulse 101  Temp(Src) 98.6 F (37 C) (Oral)  Resp 18  SpO2 93% Physical Exam  Constitutional: She appears well-developed and well-nourished. No distress.  HENT:  Head: Atraumatic.  Mouth/Throat: Oropharynx is clear and moist.  Eyes: Conjunctivae and EOM are normal. Pupils are equal, round, and reactive to light. No scleral icterus.  Neck: Neck supple. No tracheal deviation present. No thyromegaly present.  No bruits  Cardiovascular: Normal  rate, regular rhythm, normal heart sounds and intact distal pulses.   No murmur heard. Pulmonary/Chest: Effort normal and breath sounds normal. No respiratory distress. She exhibits no tenderness.  Abdominal: Soft. Normal appearance and bowel sounds are normal. She exhibits no distension. There is no tenderness.  Genitourinary:  No cva tenderness  Musculoskeletal: She exhibits no edema.  CTLS spine, non tender, aligned, no step off. Good rom bil ext without pain or focal bony tenderness  Neurological: She is alert.  Speech clear. Motor intact bil. stre 5/5. sens grossly intact.   Skin: Skin is warm and dry. No rash noted. She is not diaphoretic.  Psychiatric:  Anxious.  Denies feeling depressed or thoughts of self harm .  Nursing note and vitals reviewed.   ED Course  Procedures (including critical  care time) Labs Review  Results for orders placed or performed during the hospital encounter of 10/27/15  CBG monitoring, ED  Result Value Ref Range   Glucose-Capillary 106 (H) 65 - 99 mg/dL     I have personally reviewed and evaluated these lab results as part of my medical decision-making.    MDM   Iv ns. Seizure precautions.   Reviewed nursing notes and prior charts for additional history.   Patient's seizure hx is very unclear from review prior charts.  States takes neurontin for same. Patient indicates cant describe type of seizure or what happens during seizures, but denies loc, denies postictal period, denies incontinence, denies oral and/or other injuries.   Case management has evaluated, and indicates they have made arrangements, patient agreeable.  Recheck alert, content, tolerating po.  Patient denies any symptoms or complaints, states feels fine.   Hr 84, rr 16. bp normal. Afeb. Pulse ox 98%.   Patient currently appears stable for d/c.       Lajean Saver, MD 10/27/15 1627

## 2015-10-28 DIAGNOSIS — F259 Schizoaffective disorder, unspecified: Secondary | ICD-10-CM | POA: Diagnosis not present

## 2015-10-28 LAB — I-STAT CHEM 8, ED
BUN: 18 mg/dL (ref 6–20)
CALCIUM ION: 1.1 mmol/L — AB (ref 1.12–1.23)
CHLORIDE: 104 mmol/L (ref 101–111)
Creatinine, Ser: 0.9 mg/dL (ref 0.44–1.00)
Glucose, Bld: 105 mg/dL — ABNORMAL HIGH (ref 65–99)
HEMATOCRIT: 46 % (ref 36.0–46.0)
Hemoglobin: 15.6 g/dL — ABNORMAL HIGH (ref 12.0–15.0)
Potassium: 4.1 mmol/L (ref 3.5–5.1)
SODIUM: 142 mmol/L (ref 135–145)
TCO2: 27 mmol/L (ref 0–100)

## 2015-10-28 LAB — SALICYLATE LEVEL: Salicylate Lvl: <4 mg/dL (ref 2.8–30.0)

## 2015-10-28 LAB — ETHANOL: Alcohol, Ethyl (B): <5 mg/dL (ref <5)

## 2015-10-28 LAB — ACETAMINOPHEN LEVEL: Acetaminophen (Tylenol), Serum: <10 ug/mL — ABNORMAL LOW (ref 10–30)

## 2015-10-28 MED ORDER — POTASSIUM CHLORIDE 20 MEQ/15ML (10%) PO SOLN
20.0000 meq | Freq: Two times a day (BID) | ORAL | Status: DC
  Administered 2015-10-28: 20 meq via ORAL
  Filled 2015-10-28: qty 15

## 2015-10-28 MED ORDER — GABAPENTIN 400 MG PO CAPS
800.0000 mg | ORAL_CAPSULE | Freq: Once | ORAL | Status: AC
  Administered 2015-10-28: 800 mg via ORAL
  Filled 2015-10-28: qty 2

## 2015-10-28 NOTE — ED Notes (Addendum)
Pt has in belonging bag:  Orange shirt, skirt, whitish shoes, keys and d/c papers from this am. Kasandra Knudsen at bedside

## 2015-10-28 NOTE — Progress Notes (Addendum)
Entered in d/c instructions  [facility] [place] [facility] Call As needed - will provided home health services for you to include a nurse and [place] worker [address] [place], [place] [address] [phone] [place] Call As needed Your wheelchair will be provided by [facility] You may call them as you need them [address] [place] [address] [phone]  senior services of [place] Call As needed  [address], [place], [place] [address] [phone] can assist with [employer] Program, Case Assistance, Retired Energy manager,  [place], [employer]

## 2015-10-28 NOTE — BH Assessment (Addendum)
Assessment Note  Dominique Acosta is an 65 y.o. female.   Diagnosis:    presenting to [facility] via EMS. Patient reporting 2 ED admissions in the same day due to falls and possible seizure activity. Patient lives alone and doesn't have any supports other than her neighbor. Sts that she is concerned about increased amenesia. Patient unable to recall any events that have occurred over the last month. She is also unable to recall if her sister is still living or deceased. Patient currently oriented to time, person, place, and situation. She denies SI. No previous history of suicide attempts/gestures. No self mutilating behaviors. No HI and AVH's. No alcohol or drug use. Patient hospitalized for mental health reasons over 20 yrs ago. Patient was diagnosed with Bipolar Disorder at that time. Patient acknowledges that Schizophrenia is noted in her medical history but sts, "That's not factual information". She does admit to depressive symptoms including loss of interest in usual pleasures, hopelessness, and fatigue. Patient's main stressor at this time would be her living situation. She lives in [place] and no longer wants to reside in this particular housing. She is requesting help with getting in a ALF facility. Patient sts that she has difficultly completing her ADL's such as bathing, getting in and out of her bed, etc.   Diagnosis: Schizophrenia?, Bipolar Disorder, Depressive Disorder  Past Medical History:  Past Medical History  Diagnosis Date  . Seizure ([place])   . Epilepsy ([place])   . Hypothyroid   . High cholesterol   . Arm pain   . Dystonia   . Depression   . Chronic pain   . Coronary artery disease   . Leukemia ([place])   . Myocardial infarct ([place])   . Humerus fracture   . COPD (chronic obstructive pulmonary disease) ([place])   . Schizophrenia [facility])     Past Surgical History  Procedure Laterality Date  . Shoulder surgery    . Humerus fracture surgery    . External ear surgery      Family  History: No family history on file.  Social History:  reports that she has been smoking Cigarettes.  She has a 82 pack-year smoking history. She does not have any smokeless tobacco history on file. She reports that she does not drink alcohol or use illicit drugs.  Additional Social History:  Alcohol / Drug Use Pain Medications: SEE MAR Prescriptions: SEE MAR Over the Counter: SEE MAR History of alcohol / drug use?: No history of alcohol / drug abuse  CIWA: CIWA-Ar BP: 125/69 mmHg Pulse Rate: 68 COWS:    Allergies:  Allergies  Allergen Reactions  . Amoxicillin Anaphylaxis  . Penicillins Anaphylaxis  . Phenytoin Other (See Comments)    Other Reaction: CNS Disorder  . Dilaudid [Hydromorphone Hcl] Other (See Comments)    Psychosis (per patient)  . Haldol [Haloperidol]     Psychosis per caregiver  . Morphine And Related Other (See Comments)    psychosis    Home Medications:  (Not in a hospital admission)  OB/GYN Status:  No LMP recorded. Patient is postmenopausal.  General Assessment Data [facility] of [facility]: [facility] TTS Assessment: In system Is this a Tele or Face-to-Face Assessment?: Face-to-Face Is this an Initial Assessment or a Re-assessment for this encounter?: Initial Assessment Marital status: Single Maiden name:  (n/a) Is patient pregnant?: No Pregnancy Status: No Living Arrangements: Alone Can pt return to current living arrangement?: Yes Admission Status: Voluntary Is patient capable of signing voluntary admission?: Yes Referral Source:  Self/Family/Friend Insurance type:  ([employer] MCR and MCD)     Crisis Care Plan Living Arrangements: Alone Legal Guardian: Other: Name of Psychiatrist:  (No psychiatrist ) Name of Therapist:  (No therapist )  Education Status Is patient currently in school?: No Current Grade:  (n/a) Highest grade of school patient has completed:  (unk) Name of school:  (n/a) Contact person:  (n/a)  Risk to self with the past 6  months Suicidal Ideation: No Has patient been a risk to self within the past 6 months prior to admission? : No Suicidal Intent: No Has patient had any suicidal intent within the past 6 months prior to admission? : No Is patient at risk for suicide?: No Suicidal Plan?: No Has patient had any suicidal plan within the past 6 months prior to admission? : No Access to Means: No What has been your use of drugs/alcohol within the last 12 months?:  (patient denies) Previous Attempts/Gestures: No How many times?:  (0) Other Self Harm Risks:  (n/a) Triggers for Past Attempts:  (no prevous attempts or gestures) Intentional Self Injurious Behavior: None Family Suicide History: No Recent stressful life event(s): Other (Comment) ("I want to move into a ALF.Marland Kitchen a place that can help me".) Persecutory voices/beliefs?: No Depression: Yes Depression Symptoms: Feeling angry/irritable, Loss of interest in usual pleasures, Feeling worthless/self pity, Guilt, Fatigue, Isolating, Tearfulness, Insomnia, Despondent Substance abuse history and/or treatment for substance abuse?: No  Risk to Others within the past 6 months Homicidal Ideation: No Does patient have any lifetime risk of violence toward others beyond the six months prior to admission? : No Thoughts of Harm to Others: No Current Homicidal Intent: No Current Homicidal Plan: No Access to Homicidal Means: No Identified Victim:  (n/a) History of harm to others?: No Assessment of Violence: None Noted Violent Behavior Description:  (patient is calm and cooperative ) Does patient have access to weapons?: No Criminal Charges Pending?: No Does patient have a court date: No Is patient on probation?: No  Psychosis Hallucinations: None noted Delusions: None noted  Mental Status Report Appearance/Hygiene: Disheveled Eye Contact: Good Motor Activity: Freedom of movement Speech: Logical/coherent Level of Consciousness: Alert Mood: Depressed Affect:  Appropriate to circumstance Anxiety Level: None Thought Processes: Relevant Judgement:  (fair) Orientation: Person, Place, Time, Situation Obsessive Compulsive Thoughts/Behaviors: Minimal  Cognitive Functioning Concentration: Normal Memory: Recent Intact, Remote Intact IQ: Average Insight: Fair Impulse Control: Fair Appetite: Good Weight Loss:  (none reported) Weight Gain:  (none reported) Sleep: Decreased Total Hours of Sleep:  (varies ) Vegetative Symptoms: None  ADLScreening [facility] Assessment Services) Patient's cognitive ability adequate to safely complete daily activities?: Yes Patient able to express need for assistance with ADLs?: Yes Independently performs ADLs?: No  Prior Inpatient Therapy Prior Inpatient Therapy: Yes Prior Therapy Dates:  ("over 20 yrs ago") Prior Therapy Facilty/Provider(s):  ("I don't even remember where I was hospitalized") Reason for Treatment:  ("I think it's because I had some depression")  Prior Outpatient Therapy Prior Outpatient Therapy: No Prior Therapy Dates:  (n/a) Prior Therapy Facilty/Provider(s):  (n/a) Reason for Treatment:  (n/a) Does patient have an ACCT team?: No Does patient have Intensive In-House Services?  : No Does patient have [facility] services? : No Does patient have P4CC services?: No  ADL Screening (condition at time of admission) Patient's cognitive ability adequate to safely complete daily activities?: Yes Is the patient deaf or have difficulty hearing?: No Does the patient have difficulty seeing, even when wearing glasses/contacts?: No Does the patient have  difficulty concentrating, remembering, or making decisions?: Yes Patient able to express need for assistance with ADLs?: Yes Does the patient have difficulty dressing or bathing?: No Independently performs ADLs?: No Communication: Independent Dressing (OT): Independent Grooming: Independent Feeding: Independent Bathing: Needs assistance Is this a change  from baseline?: Pre-admission baseline Toileting: Independent In/Out Bed: Needs assistance Is this a change from baseline?: Pre-admission baseline Walks in Home: Independent Does the patient have difficulty walking or climbing stairs?: Yes Weakness of Legs: None Weakness of Arms/Hands: None  Home Assistive Devices/Equipment Home Assistive Devices/Equipment: Cane (specify quad or straight)    Abuse/Neglect Assessment (Assessment to be complete while patient is alone) Physical Abuse: Denies Verbal Abuse:  (by parents) Sexual Abuse: Denies Exploitation of patient/patient's resources: Denies Self-Neglect: Denies Values / Beliefs Cultural Requests During Hospitalization: None Spiritual Requests During Hospitalization: None   Advance Directives (For Healthcare) Does patient have an advance directive?: No Nutrition Screen- [place] Adult/[facility]/AP Patient's home diet: Regular  Additional Information 1:1 In Past 12 Months?: No CIRT Risk: No Elopement Risk: No Does patient have medical clearance?: Yes     Disposition:  Disposition Initial Assessment Completed for this Encounter: Yes  On Site Evaluation by:  Reviewed with Physician:  Patriciaann Clan, PA  Per Frederico Hamman PA, patient does not meet criteria for INPT treatment. Patient could benefit from LCSW follow up. Patient is requesting resources for a ALF.   [facility] 10/28/2015 2:09 AM

## 2015-10-28 NOTE — BH Assessment (Addendum)
Reviewed with Provider: Patriciaann Clan, PA  Per Frederico Hamman PA, patient does not meet criteria for INPT treatment. No SI, HI, and AVH's. Patient could benefit from LCSW follow up. Patient is requesting resources for a ALF.

## 2015-10-28 NOTE — Progress Notes (Addendum)
Called into [facility] [place] to speak with Dominique Acosta about meals on wheels for pt but they do not deliver there because pt lives in [place] that has a Chemical engineer site there Recommends pt apply for these services   Pt informed of this and states she will apply for the services in her apt building   Updated TCU RN, [employer] completed Pt had a call from an [employer] staff who wanted to speak with her Cm attempted to connect pt with [facility] staff when she was brought out of shower but the call got disconnected and pt states she prefers not to speak to [facility] staff at the time

## 2015-10-28 NOTE — Discharge Instructions (Signed)
Head Injury, Adult You have received a head injury. It does not appear serious at this time. Headaches and vomiting are common following head injury. It should be easy to awaken from sleeping. Sometimes it is necessary for you to stay in the emergency department for a while for observation. Sometimes admission to the hospital may be needed. After injuries such as yours, most problems occur within the first 24 hours, but side effects may occur up to 7-10 days after the injury. It is important for you to carefully monitor your condition and contact your health care provider or seek immediate medical care if there is a change in your condition. WHAT ARE THE TYPES OF HEAD INJURIES? Head injuries can be as minor as a bump. Some head injuries can be more severe. More severe head injuries include:  A jarring injury to the brain (concussion).  A bruise of the brain (contusion). This mean there is bleeding in the brain that can cause swelling.  A cracked skull (skull fracture).  Bleeding in the brain that collects, clots, and forms a bump (hematoma). WHAT CAUSES A HEAD INJURY? A serious head injury is most likely to happen to someone who is in a car wreck and is not wearing a seat belt. Other causes of major head injuries include bicycle or motorcycle accidents, sports injuries, and falls. HOW ARE HEAD INJURIES DIAGNOSED? A complete history of the event leading to the injury and your current symptoms will be helpful in diagnosing head injuries. Many times, pictures of the brain, such as CT or MRI are needed to see the extent of the injury. Often, an overnight hospital stay is necessary for observation.  WHEN SHOULD I SEEK IMMEDIATE MEDICAL CARE?  You should get help right away if:  You have confusion or drowsiness.  You feel sick to your stomach (nauseous) or have continued, forceful vomiting.  You have dizziness or unsteadiness that is getting worse.  You have severe, continued headaches not  relieved by medicine. Only take over-the-counter or prescription medicines for pain, fever, or discomfort as directed by your health care provider.  You do not have normal function of the arms or legs or are unable to walk.  You notice changes in the black spots in the center of the colored part of your eye (pupil).  You have a clear or bloody fluid coming from your nose or ears.  You have a loss of vision. During the next 24 hours after the injury, you must stay with someone who can watch you for the warning signs. This person should contact local emergency services (911 in the [place] if you have seizures, you become unconscious, or you are unable to wake up. HOW CAN I PREVENT A HEAD INJURY IN THE FUTURE? The most important factor for preventing major head injuries is avoiding motor vehicle accidents. To minimize the potential for damage to your head, it is crucial to wear seat belts while riding in motor vehicles. Wearing helmets while bike riding and playing collision sports (like football) is also helpful. Also, avoiding dangerous activities around the house will further help reduce your risk of head injury.  WHEN CAN I RETURN TO NORMAL ACTIVITIES AND ATHLETICS? You should be reevaluated by your health care provider before returning to these activities. If you have any of the following symptoms, you should not return to activities or contact sports until 1 week after the symptoms have stopped:  Persistent headache.  Dizziness or vertigo.  Poor attention and concentration.  Confusion.  Memory problems.  Nausea or vomiting.  Fatigue or tire easily.  Irritability.  Intolerant of bright lights or loud noises.  Anxiety or depression.  Disturbed sleep. MAKE SURE YOU:   Understand these instructions.  Will watch your condition.  Will get help right away if you are not doing well or get worse.   This information is not intended to replace advice given to you by your health  care provider. Make sure you discuss any questions you have with your health care provider.   Document Released: 03/28/2005 Document Revised: 04/18/2014 Document Reviewed: 12/03/2012 Elsevier Interactive Patient Education 2016 [place] in the Home  Falls can cause injuries and can affect people from all age groups. There are many simple things that you can do to make your home safe and to help prevent falls. WHAT CAN I DO ON THE OUTSIDE OF MY HOME?  Regularly repair the edges of walkways and driveways and fix any cracks.  Remove high doorway thresholds.  Trim any shrubbery on the main path into your home.  Use bright outdoor lighting.  Clear walkways of debris and clutter, including tools and rocks.  Regularly check that handrails are securely fastened and in good repair. Both sides of any steps should have handrails.  Install guardrails along the edges of any raised decks or porches.  Have leaves, snow, and ice cleared regularly.  Use sand or salt on walkways during winter months.  In the garage, clean up any spills right away, including grease or oil spills. WHAT CAN I DO IN THE BATHROOM?  Use night lights.  Install grab bars by the toilet and in the tub and shower. Do not use towel bars as grab bars.  Use non-skid mats or decals on the floor of the tub or shower.  If you need to sit down while you are in the shower, use a plastic, non-slip stool.Marland Kitchen  Keep the floor dry. Immediately clean up any water that spills on the floor.  Remove soap buildup in the tub or shower on a regular basis.  Attach bath mats securely with double-sided non-slip rug tape.  Remove throw rugs and other tripping hazards from the floor. WHAT CAN I DO IN THE BEDROOM?  Use night lights.  Make sure that a bedside light is easy to reach.  Do not use oversized bedding that drapes onto the floor.  Have a firm chair that has side arms to use for getting  dressed.  Remove throw rugs and other tripping hazards from the floor. WHAT CAN I DO IN THE KITCHEN?   Clean up any spills right away.  Avoid walking on wet floors.  Place frequently used items in easy-to-reach places.  If you need to reach for something above you, use a sturdy step stool that has a grab bar.  Keep electrical cables out of the way.  Do not use floor polish or wax that makes floors slippery. If you have to use wax, make sure that it is non-skid floor wax.  Remove throw rugs and other tripping hazards from the floor. WHAT CAN I DO IN THE STAIRWAYS?  Do not leave any items on the stairs.  Make sure that there are handrails on both sides of the stairs. Fix handrails that are broken or loose. Make sure that handrails are as long as the stairways.  Check any carpeting to make sure that it is firmly attached to the stairs. Fix any carpet that is loose or  worn.  Avoid having throw rugs at the top or bottom of stairways, or secure the rugs with carpet tape to prevent them from moving.  Make sure that you have a light switch at the top of the stairs and the bottom of the stairs. If you do not have them, have them installed. WHAT ARE SOME OTHER FALL PREVENTION TIPS?  Wear closed-toe shoes that fit well and support your feet. Wear shoes that have rubber soles or low heels.  When you use a stepladder, make sure that it is completely opened and that the sides are firmly locked. Have someone hold the ladder while you are using it. Do not climb a closed stepladder.  Add color or contrast paint or tape to grab bars and handrails in your home. Place contrasting color strips on the first and last steps.  Use mobility aids as needed, such as canes, walkers, scooters, and crutches.  Turn on lights if it is dark. Replace any light bulbs that burn out.  Set up furniture so that there are clear paths. Keep the furniture in the same spot.  Fix any uneven floor surfaces.  Choose a  carpet design that does not hide the edge of steps of a stairway.  Be aware of any and all pets.  Review your medicines with your healthcare provider. Some medicines can cause dizziness or changes in blood pressure, which increase your risk of falling. Talk with your health care provider about other ways that you can decrease your risk of falls. This may include working with a physical therapist or trainer to improve your strength, balance, and endurance.   This information is not intended to replace advice given to you by your health care provider. Make sure you discuss any questions you have with your health care provider.   Document Released: 03/18/2002 Document Revised: 08/12/2014 Document Reviewed: 05/02/2014 Elsevier Interactive Patient Education [employer].

## 2015-10-28 NOTE — Progress Notes (Addendum)
CM reviewed in details medicare guidelines, Choices of home health [facility]) (length of stay in home, types of [facility] staff available, coverage, primary caregiver, up to 24 hrs before services may be started) and choices of Private duty nursing (PDN-coverage, length of stay in the home types of staff available). CM reviewed availability of [place] SW to assist pcp to get pt to snf (if desired disposition) from the community level. CM provided pt/family with a list of [facility] home health agencies and PDN.   Pt states she was given a list of agencies previous prior to being d/c from ED but has difficulty seeing without her glasses ED Cm reviewed the list of home health agencies she provided to her and pt chose and agreed [facility] Refer to ED PM Cm note from 10/27/15 HHRN, HHPT  Pt noted with a strong odor Pt states she gets personal care services from [facility] and "they are not good" "I just stopped them from coming"   Cm in TCu when Pt assisted with a shower Cm informed pt needed assist to get to bathroom in w/c but pt able to bathe using her left hand Pt walked with her cane but unsteady  Pt agrees to a wheelchair ht 5' and around 170s lbs States she only had a borrowed w/c previous Pt noted to have independent bed mobility and able to sit on side of the bed independently

## 2015-10-28 NOTE — ED Notes (Addendum)
Pt is asleep with regular respirations. Pt does not appear in distress. Pt ate breakfast. Tolerated well.  Pt gets around at home with a cane. Pt stated the [facility] lady came yesterday and found her under the furniture. Pt is unsure how that happened. Pt is alert and oriented times three. She is pleasant. Pt stated, "I feel like  I have been hit by a mack truck." "Phoned EDP concerning pts low potassium,  urine and WBC count. (8:57am ) ) Awaiting call back from EDP. EDP came to evaluate the pt.  Phoned pharmacy for a med reconciliation as pt states she is on a lot of medications.(9am)Pharmacy tech to see pt. (9:10am )EKG done. (9:20am )Attempted times two to draw blood- unsuccessful;. 10:50am - Drew blood from pt and taken to the Micro lab.Potassium now within normal limits. Pt encouraged to push po fluids.  Pt appears very manic talking nonstop . Pt states, "I can't breathe with these paper scrubs on. " Informed pt while she is here she has to wear the scrubs. (11am )pt is rocking back and forth as she is talking to the case worker. Pt s/p a shower. She did require minimal assistance with her shower. (11:30am )Pt does use her cane to ambulate. Pt given multiple resources with contact numbers.   She is very fidegity. Shawn Route from [place] : [phone]. called to speak to the pt. (12:10pm)Phoned EDP that psch has cleared the pt. Per [place] will need to discharge the pt. Phoned  Shawn Route to notify her that the pt will be for discharge.Per Sharlene the pt does get around in the house. Sharlene stated the pt just spent $100.00 on groceries this past week. Sharlene stated the pt has fired all the aides she has had in the past. Annie Main will get the pt linked [place] program.Sharlene will check on the pt this afternoon so long as the pt is home by 5pm. Phoned [facility] to get the pt at 1:45pm. Annie Main will see the pt at the house today.Sharlene requested the nurse send the pt home with a few  drinks and a sandwich.  (1pm)[facility] delivered a wheelchair for the pt. -Pt states she lives with chronic pain. (1:20pm)Pt appears calmer now knowing that she has some sandwiches and drinks to take with her and that [place] will grocery shop for her in the am. (1:45pm)

## 2015-10-28 NOTE — ED Provider Notes (Signed)
Patient was seen and evaluated at bedside twice. Initially I was called back to evaluate patient as nursing was concerned maybe there was a medical issue going on with the patient and were concerned about her urine. Her urine has protein and squamous cells but no sign of infection. Patient was stable, alert, awake and answering questions appropriately. Patient was seen and cleared by the psychiatric team. I again reevaluated the patient. She appeared stable. I reviewed all of her results from her encounters yesterday. Patient was standing and getting dressed without difficulty. No difficulties with ambulation. Care management has seen patient and arranged for lots of support at home. She has a caregiver who will be at her home later this afternoon. Patient appears medically cleared and stable for discharge. Patient discharged home in stable condition.  Harvel Quale, MD 10/28/15 1341

## 2015-10-28 NOTE — Progress Notes (Addendum)
CSW spoke with patient at bedside. Patient appeared manic and she was continually moving while in bed as though she could not be still. Patient reports she cannot remember everything, as she states how to turn on the television or the toaster . Patient reports she had an accident yesterday where "furniture was coming down" on her at the home. Patient reports she is no longer smoking. Patient reports she has a sister that she has not spoken to in one year and also another sister named Peter Congo in [place] with no communication. Patient reports she has no friends and her other family members have passed. Patient reports she has a primary care doctor that prescribes all of her medications. Patient reports she has a Immunologist with [employer] and this person takes her to any places she needs to go at that time. Patient was provided with a list of ALF and informed to connect with her primary care doctor if she decides she wants to go into a facility. Patient reports she may consult her doctor regarding an ALF. No questions noted for CSW at this time.   Genice Rouge [id number] ED CSW 10/28/2015 12:26 PM

## 2015-10-28 NOTE — Progress Notes (Signed)
Patient suffers from seizures and recent falls which impairs their ability to perform daily activities like mobility, bathing, toiletting and dressing in the home.  A cane nor crutch will not resolve  issue with performing activities of daily living. A wheelchair will allow patient to safely perform daily activities. Patient can safely propel the wheelchair in the home or has a caregiver who can provide assistance.  Accessories: elevating leg rests (ELRs), wheel locks, extensions and anti-tippers. Height 5 foot and weight 170-175 pounds

## 2015-11-05 ENCOUNTER — Emergency Department (HOSPITAL_COMMUNITY)
Admission: EM | Admit: 2015-11-05 | Discharge: 2015-11-08 | Disposition: A | Discharge status: Home / Self Care | Payer: Medicare HMO | Attending: Emergency Medicine | Admitting: Emergency Medicine

## 2015-11-05 ENCOUNTER — Emergency Department (HOSPITAL_COMMUNITY): Payer: Medicare HMO

## 2015-11-05 ENCOUNTER — Encounter (HOSPITAL_COMMUNITY): Payer: Self-pay | Admitting: Emergency Medicine

## 2015-11-05 DIAGNOSIS — E785 Hyperlipidemia, unspecified: Secondary | ICD-10-CM | POA: Diagnosis not present

## 2015-11-05 DIAGNOSIS — F17210 Nicotine dependence, cigarettes, uncomplicated: Secondary | ICD-10-CM | POA: Diagnosis not present

## 2015-11-05 DIAGNOSIS — E7800 Pure hypercholesterolemia, unspecified: Secondary | ICD-10-CM | POA: Diagnosis not present

## 2015-11-05 DIAGNOSIS — E039 Hypothyroidism, unspecified: Secondary | ICD-10-CM | POA: Diagnosis not present

## 2015-11-05 DIAGNOSIS — Z046 Encounter for general psychiatric examination, requested by authority: Secondary | ICD-10-CM | POA: Insufficient documentation

## 2015-11-05 DIAGNOSIS — I2510 Atherosclerotic heart disease of native coronary artery without angina pectoris: Secondary | ICD-10-CM | POA: Diagnosis not present

## 2015-11-05 DIAGNOSIS — Z79891 Long term (current) use of opiate analgesic: Secondary | ICD-10-CM | POA: Diagnosis not present

## 2015-11-05 DIAGNOSIS — Z7982 Long term (current) use of aspirin: Secondary | ICD-10-CM | POA: Insufficient documentation

## 2015-11-05 DIAGNOSIS — Z0000 Encounter for general adult medical examination without abnormal findings: Secondary | ICD-10-CM | POA: Diagnosis present

## 2015-11-05 DIAGNOSIS — F329 Major depressive disorder, single episode, unspecified: Secondary | ICD-10-CM | POA: Diagnosis not present

## 2015-11-05 DIAGNOSIS — Z7951 Long term (current) use of inhaled steroids: Secondary | ICD-10-CM | POA: Insufficient documentation

## 2015-11-05 DIAGNOSIS — F29 Unspecified psychosis not due to a substance or known physiological condition: Secondary | ICD-10-CM

## 2015-11-05 DIAGNOSIS — J449 Chronic obstructive pulmonary disease, unspecified: Secondary | ICD-10-CM | POA: Diagnosis not present

## 2015-11-05 DIAGNOSIS — F259 Schizoaffective disorder, unspecified: Secondary | ICD-10-CM | POA: Diagnosis not present

## 2015-11-05 DIAGNOSIS — R4182 Altered mental status, unspecified: Secondary | ICD-10-CM | POA: Diagnosis not present

## 2015-11-05 DIAGNOSIS — Z79899 Other long term (current) drug therapy: Secondary | ICD-10-CM | POA: Diagnosis not present

## 2015-11-05 DIAGNOSIS — Z008 Encounter for other general examination: Secondary | ICD-10-CM

## 2015-11-05 LAB — CBC WITH DIFFERENTIAL/PLATELET
Basophils Absolute: 0 K/uL (ref 0.0–0.1)
Basophils Relative: 0 %
Eosinophils Absolute: 0.1 K/uL (ref 0.0–0.7)
Eosinophils Relative: 1 %
HCT: 43.4 % (ref 36.0–46.0)
Hemoglobin: 14.9 g/dL (ref 12.0–15.0)
Lymphocytes Relative: 18 %
Lymphs Abs: 3.2 K/uL (ref 0.7–4.0)
MCH: 30.2 pg (ref 26.0–34.0)
MCHC: 34.3 g/dL (ref 30.0–36.0)
MCV: 87.9 fL (ref 78.0–100.0)
Monocytes Absolute: 1.5 K/uL — ABNORMAL HIGH (ref 0.1–1.0)
Monocytes Relative: 8 %
Neutro Abs: 13 K/uL — ABNORMAL HIGH (ref 1.7–7.7)
Neutrophils Relative %: 73 %
Platelets: 443 K/uL — ABNORMAL HIGH (ref 150–400)
RBC: 4.94 MIL/uL (ref 3.87–5.11)
RDW: 12.6 % (ref 11.5–15.5)
WBC: 17.8 K/uL — ABNORMAL HIGH (ref 4.0–10.5)

## 2015-11-05 LAB — COMPREHENSIVE METABOLIC PANEL WITH GFR
ALT: 39 U/L (ref 14–54)
AST: 65 U/L — ABNORMAL HIGH (ref 15–41)
Albumin: 4.5 g/dL (ref 3.5–5.0)
Alkaline Phosphatase: 84 U/L (ref 38–126)
Anion gap: 9 (ref 5–15)
BUN: 12 mg/dL (ref 6–20)
CO2: 25 mmol/L (ref 22–32)
Calcium: 9.2 mg/dL (ref 8.9–10.3)
Chloride: 103 mmol/L (ref 101–111)
Creatinine, Ser: 0.56 mg/dL (ref 0.44–1.00)
GFR calc Af Amer: >60 mL/min
GFR calc non Af Amer: >60 mL/min
Glucose, Bld: 104 mg/dL — ABNORMAL HIGH (ref 65–99)
Potassium: 3 mmol/L — ABNORMAL LOW (ref 3.5–5.1)
Sodium: 137 mmol/L (ref 135–145)
Total Bilirubin: 0.6 mg/dL (ref 0.3–1.2)
Total Protein: 7.7 g/dL (ref 6.5–8.1)

## 2015-11-05 LAB — ETHANOL: Alcohol, Ethyl (B): <5 mg/dL

## 2015-11-05 LAB — ACETAMINOPHEN LEVEL: Acetaminophen (Tylenol), Serum: <10 ug/mL — ABNORMAL LOW (ref 10–30)

## 2015-11-05 LAB — SALICYLATE LEVEL: Salicylate Lvl: <4 mg/dL (ref 2.8–30.0)

## 2015-11-05 MED ORDER — ARIPIPRAZOLE 10 MG PO TABS
30.0000 mg | ORAL_TABLET | Freq: Every day | ORAL | Status: DC
  Administered 2015-11-05 – 2015-11-08 (×4): 30 mg via ORAL
  Filled 2015-11-05 (×5): qty 3

## 2015-11-05 MED ORDER — GABAPENTIN 800 MG PO TABS: 800.0000 mg | ORAL_TABLET | Freq: Four times a day (QID) | ORAL | Status: DC

## 2015-11-05 MED ORDER — VENLAFAXINE HCL ER 150 MG PO CP24
150.0000 mg | ORAL_CAPSULE | Freq: Two times a day (BID) | ORAL | Status: DC
  Administered 2015-11-05 – 2015-11-08 (×6): 150 mg via ORAL
  Filled 2015-11-05 (×6): qty 1

## 2015-11-05 MED ORDER — SIMVASTATIN 40 MG PO TABS
40.0000 mg | ORAL_TABLET | Freq: Every day | ORAL | Status: DC
  Administered 2015-11-05 – 2015-11-07 (×2): 40 mg via ORAL
  Filled 2015-11-05 (×4): qty 1

## 2015-11-05 MED ORDER — BACLOFEN 10 MG PO TABS
10.0000 mg | ORAL_TABLET | Freq: Two times a day (BID) | ORAL | Status: DC | PRN
  Administered 2015-11-05 – 2015-11-08 (×4): 10 mg via ORAL
  Filled 2015-11-05 (×4): qty 1

## 2015-11-05 MED ORDER — LEVOTHYROXINE SODIUM 112 MCG PO TABS
112.0000 ug | ORAL_TABLET | Freq: Every day | ORAL | Status: DC
  Administered 2015-11-06 – 2015-11-08 (×3): 112 ug via ORAL
  Filled 2015-11-05 (×3): qty 1

## 2015-11-05 MED ORDER — POTASSIUM CHLORIDE CRYS ER 20 MEQ PO TBCR
40.0000 meq | EXTENDED_RELEASE_TABLET | Freq: Once | ORAL | Status: AC
  Administered 2015-11-05: 40 meq via ORAL
  Filled 2015-11-05: qty 2

## 2015-11-05 MED ORDER — LORAZEPAM 1 MG PO TABS
1.0000 mg | ORAL_TABLET | Freq: Once | ORAL | Status: AC
  Administered 2015-11-05: 1 mg via ORAL
  Filled 2015-11-05: qty 1

## 2015-11-05 MED ORDER — GABAPENTIN 400 MG PO CAPS
1600.0000 mg | ORAL_CAPSULE | Freq: Every day | ORAL | Status: DC
  Administered 2015-11-05 – 2015-11-07 (×3): 1600 mg via ORAL
  Filled 2015-11-05 (×3): qty 4

## 2015-11-05 MED ORDER — GABAPENTIN 400 MG PO CAPS
800.0000 mg | ORAL_CAPSULE | ORAL | Status: DC
  Administered 2015-11-06 – 2015-11-08 (×6): 800 mg via ORAL
  Filled 2015-11-05 (×6): qty 2

## 2015-11-05 MED ORDER — LORAZEPAM 1 MG PO TABS
1.0000 mg | ORAL_TABLET | Freq: Three times a day (TID) | ORAL | Status: DC | PRN
  Administered 2015-11-06 – 2015-11-08 (×6): 1 mg via ORAL
  Filled 2015-11-05 (×6): qty 1

## 2015-11-05 MED ORDER — TEMAZEPAM 15 MG PO CAPS
30.0000 mg | ORAL_CAPSULE | Freq: Every day | ORAL | Status: DC
  Administered 2015-11-05 – 2015-11-07 (×3): 30 mg via ORAL
  Filled 2015-11-05 (×3): qty 2

## 2015-11-05 MED ORDER — BENZTROPINE MESYLATE 1 MG PO TABS
2.0000 mg | ORAL_TABLET | Freq: Three times a day (TID) | ORAL | Status: DC
  Administered 2015-11-05 – 2015-11-08 (×9): 2 mg via ORAL
  Filled 2015-11-05 (×9): qty 2

## 2015-11-05 NOTE — ED Notes (Signed)
Family at bedside. 

## 2015-11-05 NOTE — ED Notes (Addendum)
Pt. Made aware for the need of urine. RN,Rick made aware.

## 2015-11-05 NOTE — ED Notes (Signed)
Unable to collect urine sample due to pt being asleep. Has been made aware we need sample.

## 2015-11-05 NOTE — ED Provider Notes (Signed)
[facility] DEPT Provider Note   CSN: [id number] Arrival date & time: 11/05/15  1648  First Provider Contact:  First MD Initiated Contact with Patient 11/05/15 1904        History   Chief Complaint Chief Complaint  Patient presents with  . Medical Clearance    HPI Dominique Acosta is a 65 y.o. female with a pmhx of Schizophrenia, seizures, CAD, who presents to the ED today complaining of increased anxiety. Per home health nurse, patient has been running down the halls with a kitchen knife and urinating and elevators at the apartment complex. Level V caveat, psychosis. Patient is actively pacing around the exam room. Patient states "I think there may be a nuclear explosion or somewhere in the world". Patient also expresses concern that her family members have died but she has no history of finding out if this is true. Patient is unable to tell me anything about her past medical history. She states that she has not had any of her medications in the last 2 weeks. She denies any suicidal or homicidal ideation. Denies any chest pain or shortness of breath.  HPI  Past Medical History:  Diagnosis Date  . Arm pain   . Chronic pain   . COPD (chronic obstructive pulmonary disease) ([place])   . Coronary artery disease   . Depression   . Dystonia   . Epilepsy ([place])   . High cholesterol   . Humerus fracture   . Hypothyroid   . Leukemia ([place])   . Myocardial infarct ([place])   . Schizophrenia ([place])   . Seizure [facility])     Patient Active Problem List   Diagnosis Date Noted  . Delirium 09/05/2014  . Acute encephalopathy 09/05/2014  . Chronic pain syndrome 09/05/2014  . Diarrhea 02/14/2011  . Schizoaffective disorder ([place]) 01/20/2011  . Seizure disorder ([place]) 09/02/2010  . Fracture, humerus, head 11/05/2009  . CIGARETTE SMOKER 09/05/2007  . CHRONIC AIRWAY OBSTRUCTION NEC 07/11/2007  . Hypothyroidism 06/08/2006  . Hyperlipemia 06/08/2006    Past Surgical History:  Procedure Laterality Date   . EXTERNAL EAR SURGERY    . HUMERUS FRACTURE SURGERY    . SHOULDER SURGERY      OB History    No data available       Home Medications    Prior to Admission medications   Medication Sig Start Date End Date Taking? Authorizing Provider  acetaminophen-codeine (TYLENOL #3) 300-30 MG tablet Take 1 tablet by mouth 3 (three) times daily.    Yes Historical Provider, MD  albuterol (PROVENTIL,VENTOLIN) 90 MCG/ACT inhaler Inhale 2 puffs into the lungs as directed. 1-2 puffs up to three times a day prn Patient taking differently: Inhale 1-2 puffs into the lungs 3 (three) times daily as needed for wheezing or shortness of breath. 1-2 puffs up to three times a day prn 11/22/10  Yes Todd D McDiarmid, MD  ARIPiprazole (ABILIFY) 30 MG tablet Take 30 mg by mouth daily.  08/11/15  Yes Historical Provider, MD  aspirin 81 MG tablet Take 81 mg by mouth daily.    Yes Historical Provider, MD  baclofen (LIORESAL) 10 MG tablet Take 10 mg by mouth 2 (two) times daily as needed for muscle spasms.  09/13/15  Yes Historical Provider, MD  benztropine (COGENTIN) 2 MG tablet Take 1 tablet by mouth 3 (three) times daily. 01/06/14  Yes Historical Provider, MD  bisacodyl (DULCOLAX) 5 MG EC tablet Take 5 mg by mouth daily as needed for moderate constipation.  Yes Historical Provider, MD  diphenhydrAMINE (BENADRYL) 25 mg capsule Take 25 mg by mouth every 6 (six) hours as needed for itching, allergies or sleep.   Yes Historical Provider, MD  docusate sodium (COLACE) 100 MG capsule Take 100 mg by mouth daily as needed for mild constipation or moderate constipation.   Yes Historical Provider, MD  gabapentin (NEURONTIN) 800 MG tablet Take 800-1,600 mg by mouth 4 (four) times daily. 1 tab three times daily and 2 tablets at beditme. 10/12/10  Yes Todd D McDiarmid, MD  levothyroxine (SYNTHROID, LEVOTHROID) 112 MCG tablet Take 112 mcg by mouth daily.     Yes Historical Provider, MD  LORazepam (ATIVAN) 1 MG tablet Take 1 tablet by mouth 3  (three) times daily as needed for anxiety.  03/12/14  Yes Historical Provider, MD  Oxycodone HCl 10 MG TABS Take 1 tablet by mouth 4 (four) times daily.  03/12/14  Yes Historical Provider, MD  simvastatin (ZOCOR) 40 MG tablet Take 40 mg by mouth daily.   Yes Historical Provider, MD  temazepam (RESTORIL) 30 MG capsule Take 30 mg by mouth at bedtime.   Yes Historical Provider, MD  venlafaxine (EFFEXOR-XR) 150 MG 24 hr capsule Take 150 mg by mouth 2 (two) times daily. Reported on 10/27/2015   Yes Historical Provider, MD  simvastatin (ZOCOR) 80 MG tablet Take 0.5 tablets (40 mg total) by mouth at bedtime. Patient not taking: Reported on 10/27/2015 02/16/11   Lyndee Hensen, MD    Family History History reviewed. No pertinent family history.  Social History Social History  Substance Use Topics  . Smoking status: Current Every Day Smoker    Packs/day: 2.00    Years: 41.00    Types: Cigarettes  . Smokeless tobacco: Not on file  . Alcohol use No     Allergies   Amoxicillin; Penicillins; Phenytoin; Dilaudid [hydromorphone hcl]; Haldol [haloperidol]; and Morphine and related   Review of Systems Review of Systems  Unable to perform ROS: Mental status change     Physical Exam Updated Vital Signs BP (!) 131/116 (BP Location: Left Arm)   Pulse 90   Temp 99 F (37.2 C) (Oral)   Resp 18   SpO2 99%   Physical Exam  Constitutional: She appears well-developed and well-nourished. No distress.  Pt anxious, pacing the room back and forth  HENT:  Head: Normocephalic and atraumatic.  Eyes: Conjunctivae and EOM are normal. Pupils are equal, round, and reactive to light. Right eye exhibits no discharge. Left eye exhibits no discharge. No scleral icterus.  Cardiovascular: Regular rhythm, normal heart sounds and intact distal pulses.  Exam reveals no gallop and no friction rub.   No murmur heard. Tachycardic to 106  Pulmonary/Chest: Effort normal and breath sounds normal. No respiratory distress.  She has no wheezes. She has no rales. She exhibits no tenderness.  Abdominal: Soft. She exhibits no distension. There is no tenderness. There is no guarding.  Musculoskeletal: Normal range of motion. She exhibits no edema.  Neurological: She is alert. No cranial nerve deficit.  Pt disoriented to place. No slurred speech. No facial droop. Strength 5/5 throughout.  Skin: Skin is warm and dry. No rash noted. She is not diaphoretic. No erythema. No pallor.  Psychiatric: She has a normal mood and affect. Her behavior is normal.  Nursing note and vitals reviewed.    ED Treatments / Results  Labs (all labs ordered are listed, but only abnormal results are displayed) Labs Reviewed  URINE RAPID DRUG SCREEN, HOSP  PERFORMED - Abnormal; Notable for the following:       Result Value   Benzodiazepines POSITIVE (*)    All other components within normal limits  COMPREHENSIVE METABOLIC PANEL - Abnormal; Notable for the following:    Potassium 3.0 (*)    Glucose, Bld 104 (*)    AST 65 (*)    All other components within normal limits  ACETAMINOPHEN LEVEL - Abnormal; Notable for the following:    Acetaminophen (Tylenol), Serum <10 (*)    All other components within normal limits  CBC WITH DIFFERENTIAL/PLATELET - Abnormal; Notable for the following:    WBC 17.8 (*)    Platelets 443 (*)    Neutro Abs 13.0 (*)    Monocytes Absolute 1.5 (*)    All other components within normal limits  URINALYSIS, ROUTINE W REFLEX MICROSCOPIC (NOT AT [facility])  ETHANOL  SALICYLATE LEVEL    EKG  EKG Interpretation None       Radiology No results found.  Procedures Procedures (including critical care time)  Medications Ordered in ED Medications  LORazepam (ATIVAN) tablet 1 mg (not administered)  ARIPiprazole (ABILIFY) tablet 30 mg (30 mg Oral Given 11/05/15 1755)  baclofen (LIORESAL) tablet 10 mg (not administered)  benztropine (COGENTIN) tablet 2 mg (2 mg Oral Given 11/05/15 1756)  levothyroxine (SYNTHROID,  LEVOTHROID) tablet 112 mcg (not administered)  simvastatin (ZOCOR) tablet 40 mg (not administered)  temazepam (RESTORIL) capsule 30 mg (not administered)  venlafaxine XR (EFFEXOR-XR) 24 hr capsule 150 mg (not administered)  gabapentin (NEURONTIN) capsule 800 mg (not administered)  gabapentin (NEURONTIN) capsule 1,600 mg (not administered)  LORazepam (ATIVAN) tablet 1 mg (1 mg Oral Given 11/05/15 1755)     Initial Impression / Assessment and Plan / ED Course  I have reviewed the triage vital signs and the nursing notes.  Pertinent labs & imaging results that were available during my care of the patient were reviewed by me and considered in my medical decision making (see chart for details).  Pt with significant psychiatric history presents to the ED today BIB home health nurse for psychotic behavior. Pt has been carrying around a knife at home and threatening other, urinating in apartment elevators. Pt appears anxious in ED, pacing in exam room. Tangential speech. Pt states "I think there has been a nuclear explosion". Pt has not taken her psych meds in 2 weeks. Pt is tachycardic, suspect this is due to her severe anxiety. Will obtain medical clearance labs and administer ativan. TTS consulted. Clinical Course  Comment By Time  Pt now sleeping in exam room after Ativan. HR normalized. All VSS. Carlos Levering, PA-C 07/27 1930   Leukocytosis present. This appears to be consistent with previous labs, nonspecific. Pt afebrile. Do not suspect infectious source. Potassium mildly low, repleted in ED. All other labs, EKG unremarkable. Pt is medically cleared. TTS consulted and recommend inpatient treatment. Pt will await bed placement.   Final Clinical Impressions(s) / ED Diagnoses   Final diagnoses:  Psychosis, unspecified psychosis type  Medical clearance for psychiatric admission    New Prescriptions New Prescriptions   No medications on file     Carlos Levering,  PA-C 11/06/15 [place], MD 11/10/15 2349

## 2015-11-05 NOTE — BHH Counselor (Signed)
Pt information was faxed to the following facilities with open beds:  [place], [place], [facility], [place] Triage Specialist [facility]

## 2015-11-05 NOTE — ED Notes (Addendum)
Per Home Health Nurse, Prescott Gum [phone], for the past two days patient has been sitting in the lobby of her apartment complex with knives, running up and down the halls naked, and urinating in the floors. Home Health Nurse found patient today in a "psychotic state." Patient's father was her primary caregiver and died eight months ago. Since father's death patient has continued to decline.

## 2015-11-05 NOTE — ED Notes (Signed)
Pt. Asked "how do we know that we have not had a nuclear explosion".

## 2015-11-05 NOTE — ED Triage Notes (Signed)
Per EMS, pt from home, has had no medications x2 months. Per home health nurse, pt has been running down the halls with a kitchen knife and urinating in the elevators at the apartment complex.

## 2015-11-05 NOTE — Progress Notes (Signed)
Patient noted to have been found at her apartment in a "manic" state per chart review. Patient running in the hallways of her apartment complex naked, with knives, urinating on the floors. This EDCM has seen the patient on 07/18 and arranged home health services for RN and SW. At that time, patient reported she had private duty aide services for 57.5 hours a week. Day shift EDCM has also seen this patient and arranged for patient to have a wheelchair.  Also inquired about meals on wheels program for patient, however patient does not qualify as sh lives in an apartment complex where the patient can apply for services with onsite nutrition services. TTS consult placed for manic behavior. No further case management needs at this time.

## 2015-11-05 NOTE — ED Notes (Signed)
Patient restless while trying to get her lay still on stretcher to get an EKG.  Patient Constantly pacing the room and in and out of doorway into hallway.

## 2015-11-05 NOTE — BH Assessment (Signed)
Assessment Note  Dominique Acosta is an 65 y.o. female with history of Depression and Schizophrenia. Per Home Health Nurse, Prescott Gum [phone], for the past two days patient has been sitting in the lobby of her apartment complex with knives, running up and down the halls naked, and urinating in the floors. Home Health Nurse found patient today in a "psychotic state." Patient's father was her primary caregiver and died eight months ago. Since father's death patient has continued to decline. Patient lives alone and doesn't have any supports other than her neighbor. Sts that she is concerned about increased amenesia and increased falls. Patient unable to recall any events that have occurred over the last month. Sts, "I can't remember if my sister is still living or deceased".  Patient currently oriented to time, person, place, and situation.   Writer met with patient face to face. She was pacing the room. Patient walking around the room with gown open and throwing water on herself. Patient complaining that she is extremely hot and can't sit down until she gets some medications. She denies current suicidal thoughts. No previous history of suicide attempts/gestures. No self mutilating behaviors. No HI and AVH's. No alcohol or drug use.  Patient hospitalized for mental health reasons over 20 yrs ago. Patient was diagnosed with Bipolar Disorder at that time. Patient acknowledges that Schizophrenia is noted in her medical history but sts, "That's not factual information". She does admit to depressive symptoms including loss of interest in usual pleasures, hopelessness, and fatigue. Patient's main stressor at this time would be her living situation. She lives in [place] and no longer wants to reside in this particular housing. She is requesting help with getting in a ALF facility. Patient sts that she has difficultly completing her ADL's such as bathing, getting in and out of her bed, etc.    Diagnosis:  Schizophrenia?, Bipolar Disorder, Depressive Disorder  Past Medical History:  Past Medical History:  Diagnosis Date  . Arm pain   . Chronic pain   . COPD (chronic obstructive pulmonary disease) ([place])   . Coronary artery disease   . Depression   . Dystonia   . Epilepsy ([place])   . High cholesterol   . Humerus fracture   . Hypothyroid   . Leukemia ([place])   . Myocardial infarct ([place])   . Schizophrenia ([place])   . Seizure [facility] [place])     Past Surgical History:  Procedure Laterality Date  . EXTERNAL EAR SURGERY    . HUMERUS FRACTURE SURGERY    . SHOULDER SURGERY      Family History: History reviewed. No pertinent family history.  Social History:  reports that she has been smoking Cigarettes.  She has a 82.00 pack-year smoking history. She does not have any smokeless tobacco history on file. She reports that she does not drink alcohol or use drugs.  Additional Social History:  Alcohol / Drug Use Pain Medications: SEE MAR Prescriptions: SEE MAR Over the Counter: SEE MAR History of alcohol / drug use?: No history of alcohol / drug abuse  CIWA: CIWA-Ar BP: (!) 131/116 Pulse Rate: 90 COWS:    Allergies:  Allergies  Allergen Reactions  . Amoxicillin Anaphylaxis  . Penicillins Anaphylaxis    Has patient had a PCN reaction causing immediate rash, facial/tongue/throat swelling, SOB or lightheadedness with hypotension: yes Has patient had a PCN reaction causing severe rash involving mucus membranes or skin necrosis: no Has patient had a PCN reaction that required hospitalization: yes Has patient had  a PCN reaction occurring within the last 10 years: no If all of the above answers are "NO", then may proceed with Cephalosporin use.   Marland Kitchen Phenytoin Other (See Comments)    Other Reaction: CNS Disorder  . Dilaudid [Hydromorphone Hcl] Other (See Comments)    Psychosis (per patient)  . Haldol [Haloperidol]     Psychosis per caregiver  . Morphine And Related Other (See Comments)    psychosis     Home Medications:  (Not in a hospital admission)  OB/GYN Status:  No LMP recorded. Patient is postmenopausal.  General Assessment Data [facility] of Assessment: [facility] TTS Assessment: In system Is this a Tele or Face-to-Face Assessment?: Face-to-Face Is this an Initial Assessment or a Re-assessment for this encounter?: Initial Assessment Marital status: Single Maiden name:  (n/a) Is patient pregnant?: No Pregnancy Status: No Living Arrangements: Alone Can pt return to current living arrangement?: Yes Admission Status: Voluntary Is patient capable of signing voluntary admission?: Yes Referral Source: Self/Family/Friend Insurance type:  ([employer] MCR and MCD)     Crisis Care Plan Living Arrangements: Alone Legal Guardian: Other: Name of Psychiatrist:  (No psychiatrist ) Name of Therapist:  (No therapist )  Education Status Is patient currently in school?: No Current Grade:  (n/a) Highest grade of school patient has completed:  (unk) Name of school:  (n/a) Contact person:  (n/a)  Risk to self with the past 6 months Suicidal Ideation: No Has patient been a risk to self within the past 6 months prior to admission? : No Suicidal Intent: No Has patient had any suicidal intent within the past 6 months prior to admission? : No Is patient at risk for suicide?: No Suicidal Plan?: No Has patient had any suicidal plan within the past 6 months prior to admission? : No Access to Means: No What has been your use of drugs/alcohol within the last 12 months?:  (denies ) Previous Attempts/Gestures: No How many times?:  (0) Other Self Harm Risks:  (n/a) Triggers for Past Attempts:  (no prevous attempts or gestures) Intentional Self Injurious Behavior: None Family Suicide History: No Recent stressful life event(s): Other (Comment) ("I want to move into a ALF...a place that can help me") Persecutory voices/beliefs?: No Depression: Yes Depression Symptoms: Loss of interest in usual  pleasures, Feeling worthless/self pity, Feeling angry/irritable, Fatigue, Isolating, Insomnia Substance abuse history and/or treatment for substance abuse?: No Suicide prevention information given to non-admitted patients: Not applicable  Risk to Others within the past 6 months Homicidal Ideation: No Does patient have any lifetime risk of violence toward others beyond the six months prior to admission? : No Thoughts of Harm to Others: No Current Homicidal Intent: No Current Homicidal Plan: No Access to Homicidal Means: No Identified Victim:  (n/a) History of harm to others?: No Assessment of Violence: None Noted Violent Behavior Description:  (patient is calm and cooperative ) Does patient have access to weapons?: No Criminal Charges Pending?: No Does patient have a court date: No Is patient on probation?: No  Psychosis Hallucinations: None noted Delusions: None noted  Mental Status Report Appearance/Hygiene: Disheveled Eye Contact: Good Motor Activity: Freedom of movement Speech: Logical/coherent Level of Consciousness: Alert Affect: Appropriate to circumstance, Depressed Anxiety Level: None Thought Processes: Relevant Judgement:  (fair) Orientation: Person, Place, Time, Situation Obsessive Compulsive Thoughts/Behaviors: Minimal  Cognitive Functioning Concentration: Normal Memory: Remote Intact, Recent Intact IQ: Average Insight: Fair Impulse Control: Fair Appetite: Good Weight Loss:  (none reported) Weight Gain:  (none reported) Sleep: Decreased Total  Hours of Sleep:  (varies ) Vegetative Symptoms: None  ADLScreening [facility] Assessment Services) Patient's cognitive ability adequate to safely complete daily activities?: Yes Patient able to express need for assistance with ADLs?: Yes Independently performs ADLs?: No  Prior Inpatient Therapy Prior Inpatient Therapy: Yes Prior Therapy Dates:  ("over 20 yrs ago") Prior Therapy Facilty/Provider(s):  ("I don't even  remember where I was hospitalized") Reason for Treatment:  ("I think it's because I had some depression")  Prior Outpatient Therapy Prior Outpatient Therapy: No Prior Therapy Dates:  (n/a) Prior Therapy Facilty/Provider(s):  (n/a) Reason for Treatment:  (n/a) Does patient have an ACCT team?: No Does patient have Intensive In-House Services?  : No Does patient have [facility] services? : No Does patient have P4CC services?: No  ADL Screening (condition at time of admission) Patient's cognitive ability adequate to safely complete daily activities?: Yes Is the patient deaf or have difficulty hearing?: No Does the patient have difficulty seeing, even when wearing glasses/contacts?: No Does the patient have difficulty concentrating, remembering, or making decisions?: Yes Patient able to express need for assistance with ADLs?: Yes Does the patient have difficulty dressing or bathing?: No Independently performs ADLs?: No Communication: Independent Dressing (OT): Independent Grooming: Independent Feeding: Independent Bathing: Independent Is this a change from baseline?: Pre-admission baseline Toileting: Independent In/Out Bed: Independent Is this a change from baseline?: Pre-admission baseline Walks in Home: Independent Does the patient have difficulty walking or climbing stairs?: No Weakness of Legs: None Weakness of Arms/Hands: None  Home Assistive Devices/Equipment Home Assistive Devices/Equipment: None    Abuse/Neglect Assessment (Assessment to be complete while patient is alone) Physical Abuse: Denies Verbal Abuse: Denies Sexual Abuse: Denies Exploitation of patient/patient's resources: Denies Self-Neglect: Denies Values / Beliefs Cultural Requests During Hospitalization: None Spiritual Requests During Hospitalization: None   Advance Directives (For Healthcare) Does patient have an advance directive?: No Would patient like information on creating an advanced directive?:  No - patient declined information Nutrition Screen- [facility] Patient's home diet: Regular  Additional Information 1:1 In Past 12 Months?: No CIRT Risk: No Elopement Risk: No Does patient have medical clearance?: Yes     Disposition:  Disposition Initial Assessment Completed for this Encounter: Yes Disposition of Patient: Inpatient treatment program Worthy Rancher, NP meets criteria for INPT [place] [facility] Good[facility])) Type of inpatient treatment program: Adult (Per Priscille Loveless, NP, patient meets criteria for INPT [place])  On Site Evaluation by:   Reviewed with Physician:  Worthy Rancher, NP   Patient meets criteria for [place] placement, per Worthy Rancher.  [facility]on [facility] 11/05/2015 7:00 PM

## 2015-11-05 NOTE — ED Notes (Signed)
Bed: WA13 Expected date:  Expected time:  Means of arrival:  Comments: EMS 

## 2015-11-06 DIAGNOSIS — F259 Schizoaffective disorder, unspecified: Secondary | ICD-10-CM | POA: Diagnosis not present

## 2015-11-06 DIAGNOSIS — F29 Unspecified psychosis not due to a substance or known physiological condition: Secondary | ICD-10-CM | POA: Diagnosis not present

## 2015-11-06 LAB — URINALYSIS, ROUTINE W REFLEX MICROSCOPIC
Bilirubin Urine: NEGATIVE
Glucose, UA: NEGATIVE mg/dL
Hgb urine dipstick: NEGATIVE
Ketones, ur: NEGATIVE mg/dL
Leukocytes, UA: NEGATIVE
Nitrite: NEGATIVE
Protein, ur: NEGATIVE mg/dL
Specific Gravity, Urine: 1.012 (ref 1.005–1.030)
pH: 6 (ref 5.0–8.0)

## 2015-11-06 LAB — RAPID URINE DRUG SCREEN, HOSP PERFORMED
Amphetamines: NOT DETECTED
Barbiturates: NOT DETECTED
Benzodiazepines: POSITIVE — AB
Cocaine: NOT DETECTED
Opiates: NOT DETECTED
Tetrahydrocannabinol: NOT DETECTED

## 2015-11-06 NOTE — BH Assessment (Signed)
[place] Assessment Progress Note  Per Corena Pilgrim, MD, this pt requires psychiatric hospitalization at this time.  The following facilities have been contacted to seek placement for this pt, with results as noted:  Beds available, information sent, decision pending:  [place]:  [place] ("not appropriate")   At capacity:  [place], [place] Triage Specialist [phone]

## 2015-11-06 NOTE — ED Notes (Signed)
Pt c/o not voiding today. Bladder scanned and shows 340ml. Pt denies feeling the urge to urinate. Pt encouraged to attempt to walk to the bathroom and attempt to urinate but pt refuses to  get up at this time. Pt states if she has a cup of juice, she may feel the urge to urinate and she will attempt to go to the bathroom. Pt states she has a stricture and bladder sling, but denies having problems urinating at home. Pt given a cup of juice, will continue to monitor.

## 2015-11-06 NOTE — ED Notes (Addendum)
Pt resting comfortably and remains calm and cooperative.  Sitter remains at bedside.  Denies any pain or any complaints at this time

## 2015-11-06 NOTE — Consult Note (Signed)
[facility] Consult   Reason for Consult:  Paranoia, psychosis Referring Physician:  EDP Patient Identification: Dominique Acosta No MRN:  [id number] Principal Diagnosis: Schizoaffective disorder, unspecified ([place]) Diagnosis:   Patient Active Problem List   Diagnosis Date Noted  . Schizoaffective disorder, unspecified ([place]) [F25.9] 01/20/2011    Priority: High  . Delirium [R41.0] 09/05/2014  . Acute encephalopathy [G93.40] 09/05/2014  . Chronic pain syndrome [G89.4] 09/05/2014  . Diarrhea [R19.7] 02/14/2011  . Seizure disorder ([place]) [V61.607] 09/02/2010  . Fracture, humerus, head [S42.293A] 11/05/2009  . CIGARETTE SMOKER [F17.200] 09/05/2007  . CHRONIC AIRWAY OBSTRUCTION NEC [J44.9] 07/11/2007  . Hypothyroidism [E03.9] 06/08/2006  . Hyperlipemia [E78.5] 06/08/2006    Total Time spent with patient: 45 minutes  Subjective:   Dominique Acosta is a 65 y.o. female patient admitted with psychosis.  HPI:  On admission;  65 y.o. female with history of Depression and Schizophrenia. Per Home Health Nurse, Prescott Gum [phone], for the past two days patient has been sitting in the lobby of her apartment complex with knives, running up and down the halls naked, and urinating in the floors. Home Health Nurse found patient today in a "psychotic state." Patient's father was her primary caregiver and died eight months ago. Since father's death patient has continued to decline. Patient lives alone and doesn't have any supports other than her neighbor. Sts that she is concerned about increased amenesia and increased falls. Patient unable to recall any events that have occurred over the last month. Sts, "I can't remember if my sister is still living or deceased".  Patient currently oriented to time, person, place, and situation.   Writer met with patient face to face. She was pacing the room. Patient walking around the room with gown open and throwing water on herself. Patient complaining that  she is extremely hot and can't sit down until she gets some medications. She denies current suicidal thoughts. No previous history of suicide attempts/gestures. No self mutilating behaviors. No HI and AVH's. No alcohol or drug use.  Patient hospitalized for mental health reasons over 20 yrs ago. Patient was diagnosed with Bipolar Disorder at that time. Patient acknowledges that Schizophrenia is noted in her medical history but sts, "That's not factual information". She does admit to depressive symptoms including loss of interest in usual pleasures, hopelessness, and fatigue. Patient's main stressor at this time would be her living situation. She lives in [place] and no longer wants to reside in this particular housing. She is requesting help with getting in a ALF facility. Patient sts that she has difficultly completing her ADL's such as bathing, getting in and out of her bed, etc.   Today:  Patient reports her sister is a murderer and feels she is trying to kill her.  She states she was eating with knives as her other utensils were dirty and adamantly denies she was naked but the [employer] got it wrong.  She has not had her medications for the past 2 weeks and does feels she needs to stabilize, requests to go to [employer] [facility].  Past Psychiatric History: schizoaffective disorder  Risk to Self: Suicidal Ideation: No Suicidal Intent: No Is patient at risk for suicide?: No Suicidal Plan?: No Access to Means: No What has been your use of drugs/alcohol within the last 12 months?:  (denies ) How many times?:  (0) Other Self Harm Risks:  (n/a) Triggers for Past Attempts:  (no prevous attempts or gestures) Intentional Self Injurious Behavior: None Risk to  Others: Homicidal Ideation: No Thoughts of Harm to Others: No Current Homicidal Intent: No Current Homicidal Plan: No Access to Homicidal Means: No Identified Victim:  (n/a) History of harm to others?: No Assessment  of Violence: None Noted Violent Behavior Description:  (patient is calm and cooperative ) Does patient have access to weapons?: No Criminal Charges Pending?: No Does patient have a court date: No Prior Inpatient Therapy: Prior Inpatient Therapy: Yes Prior Therapy Dates:  ("over 20 yrs ago") Prior Therapy Facilty/Provider(s):  ("I don't even remember where I was hospitalized") Reason for Treatment:  ("I think it's because I had some depression") Prior Outpatient Therapy: Prior Outpatient Therapy: No Prior Therapy Dates:  (n/a) Prior Therapy Facilty/Provider(s):  (n/a) Reason for Treatment:  (n/a) Does patient have an ACCT team?: No Does patient have Intensive In-House Services?  : No Does patient have [facility] services? : No Does patient have P4[facility] services?: No  Past Medical History:  Past Medical History:  Diagnosis Date  . Arm pain   . Chronic pain   . COPD (chronic obstructive pulmonary disease) ([place])   . Coronary artery disease   . Depression   . Dystonia   . Epilepsy ([place])   . High cholesterol   . Humerus fracture   . Hypothyroid   . Leukemia ([place])   . Myocardial infarct ([place])   . Schizophrenia ([place])   . Seizure [facility])     Past Surgical History:  Procedure Laterality Date  . EXTERNAL EAR SURGERY    . HUMERUS FRACTURE SURGERY    . SHOULDER SURGERY     Family History: History reviewed. No pertinent family history. Family Psychiatric  History: none Social History:  History  Alcohol Use No     History  Drug Use No    Social History   Social History  . Marital status: Single    Spouse name: N/A  . Number of children: N/A  . Years of education: N/A   Social History Main Topics  . Smoking status: Current Every Day Smoker    Packs/day: 2.00    Years: 41.00    Types: Cigarettes  . Smokeless tobacco: None  . Alcohol use No  . Drug use: No  . Sexual activity: No   Other Topics Concern  . None   Social History Narrative  . None   Additional Social  History:    Allergies:   Allergies  Allergen Reactions  . Amoxicillin Anaphylaxis  . Penicillins Anaphylaxis    Has patient had a PCN reaction causing immediate rash, facial/tongue/throat swelling, SOB or lightheadedness with hypotension: yes Has patient had a PCN reaction causing severe rash involving mucus membranes or skin necrosis: no Has patient had a PCN reaction that required hospitalization: yes Has patient had a PCN reaction occurring within the last 10 years: no If all of the above answers are "NO", then may proceed with Cephalosporin use.   Marland Kitchen Phenytoin Other (See Comments)    Other Reaction: CNS Disorder  . Dilaudid [Hydromorphone Hcl] Other (See Comments)    Psychosis (per patient)  . Haldol [Haloperidol]     Psychosis per caregiver  . Morphine And Related Other (See Comments)    psychosis    Labs:  Results for orders placed or performed during the hospital encounter of 11/05/15 (from the past 48 hour(s))  Comprehensive metabolic panel     Status: Abnormal   Collection Time: 11/05/15  6:25 PM  Result Value Ref Range   Sodium 137 135 - 145  mmol/L   Potassium 3.0 (L) 3.5 - 5.1 mmol/L   Chloride 103 101 - 111 mmol/L   CO2 25 22 - 32 mmol/L   Glucose, Bld 104 (H) 65 - 99 mg/dL   BUN 12 6 - 20 mg/dL   Creatinine, Ser 0.56 0.44 - 1.00 mg/dL   Calcium 9.2 8.9 - 10.3 mg/dL   Total Protein 7.7 6.5 - 8.1 g/dL   Albumin 4.5 3.5 - 5.0 g/dL   AST 65 (H) 15 - 41 U/L   ALT 39 14 - 54 U/L   Alkaline Phosphatase 84 38 - 126 U/L   Total Bilirubin 0.6 0.3 - 1.2 mg/dL   GFR calc non Af Amer >60 >60 mL/min   GFR calc Af Amer >60 >60 mL/min    Comment: (NOTE) The eGFR has been calculated using the CKD EPI equation. This calculation has not been validated in all clinical situations. eGFR's persistently <60 mL/min signify possible Chronic Kidney Disease.    Anion gap 9 5 - 15  Ethanol     Status: None   Collection Time: 11/05/15  6:25 PM  Result Value Ref Range   Alcohol,  Ethyl (B) <5 <5 mg/dL    Comment:        LOWEST DETECTABLE LIMIT FOR SERUM ALCOHOL IS 5 mg/dL FOR MEDICAL PURPOSES ONLY   Acetaminophen level     Status: Abnormal   Collection Time: 11/05/15  6:25 PM  Result Value Ref Range   Acetaminophen (Tylenol), Serum <10 (L) 10 - 30 ug/mL    Comment:        THERAPEUTIC CONCENTRATIONS VARY SIGNIFICANTLY. A RANGE OF 10-30 ug/mL MAY BE AN EFFECTIVE CONCENTRATION FOR MANY PATIENTS. HOWEVER, SOME ARE BEST TREATED AT CONCENTRATIONS OUTSIDE THIS RANGE. ACETAMINOPHEN CONCENTRATIONS >150 ug/mL AT 4 HOURS AFTER INGESTION AND >50 ug/mL AT 12 HOURS AFTER INGESTION ARE OFTEN ASSOCIATED WITH TOXIC REACTIONS.   CBC with Differential     Status: Abnormal   Collection Time: 11/05/15  6:25 PM  Result Value Ref Range   WBC 17.8 (H) 4.0 - 10.5 K/uL   RBC 4.94 3.87 - 5.11 MIL/uL   Hemoglobin 14.9 12.0 - 15.0 g/dL   HCT 43.4 36.0 - 46.0 %   MCV 87.9 78.0 - 100.0 fL   MCH 30.2 26.0 - 34.0 pg   MCHC 34.3 30.0 - 36.0 g/dL   RDW 12.6 11.5 - 15.5 %   Platelets 443 (H) 150 - 400 K/uL   Neutrophils Relative % 73 %   Neutro Abs 13.0 (H) 1.7 - 7.7 K/uL   Lymphocytes Relative 18 %   Lymphs Abs 3.2 0.7 - 4.0 K/uL   Monocytes Relative 8 %   Monocytes Absolute 1.5 (H) 0.1 - 1.0 K/uL   Eosinophils Relative 1 %   Eosinophils Absolute 0.1 0.0 - 0.7 K/uL   Basophils Relative 0 %   Basophils Absolute 0.0 0.0 - 0.1 K/uL  Salicylate level     Status: None   Collection Time: 11/05/15  6:25 PM  Result Value Ref Range   Salicylate Lvl <7.3 2.8 - 30.0 mg/dL  Urine rapid drug screen (hosp performed)     Status: Abnormal   Collection Time: 11/06/15  4:58 AM  Result Value Ref Range   Opiates NONE DETECTED NONE DETECTED   Cocaine NONE DETECTED NONE DETECTED   Benzodiazepines POSITIVE (A) NONE DETECTED   Amphetamines NONE DETECTED NONE DETECTED   Tetrahydrocannabinol NONE DETECTED NONE DETECTED   Barbiturates NONE DETECTED NONE DETECTED  Comment:        DRUG SCREEN  FOR MEDICAL PURPOSES ONLY.  IF CONFIRMATION IS NEEDED FOR ANY PURPOSE, NOTIFY LAB WITHIN 5 DAYS.        LOWEST DETECTABLE LIMITS FOR URINE DRUG SCREEN Drug Class       Cutoff (ng/mL) Amphetamine      1000 Barbiturate      200 Benzodiazepine   585 Tricyclics       277 Opiates          300 Cocaine          300 THC              50   Urinalysis, Routine w reflex microscopic     Status: None   Collection Time: 11/06/15  4:58 AM  Result Value Ref Range   Color, Urine YELLOW YELLOW   APPearance CLEAR CLEAR   Specific Gravity, Urine 1.012 1.005 - 1.030   pH 6.0 5.0 - 8.0   Glucose, UA NEGATIVE NEGATIVE mg/dL   Hgb urine dipstick NEGATIVE NEGATIVE   Bilirubin Urine NEGATIVE NEGATIVE   Ketones, ur NEGATIVE NEGATIVE mg/dL   Protein, ur NEGATIVE NEGATIVE mg/dL   Nitrite NEGATIVE NEGATIVE   Leukocytes, UA NEGATIVE NEGATIVE    Comment: MICROSCOPIC NOT DONE ON URINES WITH NEGATIVE PROTEIN, BLOOD, LEUKOCYTES, NITRITE, OR GLUCOSE <1000 mg/dL.    Current Facility-Administered Medications  Medication Dose Route Frequency Provider Last Rate Last Dose  . ARIPiprazole (ABILIFY) tablet 30 mg  30 mg Oral Daily Samantha Tripp Dowless, PA-C   30 mg at 11/06/15 1029  . baclofen (LIORESAL) tablet 10 mg  10 mg Oral BID PRN Dondra Spry Dowless, PA-C   10 mg at 11/05/15 2357  . benztropine (COGENTIN) tablet 2 mg  2 mg Oral TID Samantha Tripp Dowless, PA-C   2 mg at 11/06/15 1030  . gabapentin (NEURONTIN) capsule 1,600 mg  1,600 mg Oral QHS Charlesetta Shanks, MD   1,600 mg at 11/05/15 2239  . gabapentin (NEURONTIN) capsule 800 mg  800 mg Oral 3 times per day Charlesetta Shanks, MD   800 mg at 11/06/15 1029  . levothyroxine (SYNTHROID, LEVOTHROID) tablet 112 mcg  112 mcg Oral QAC breakfast Samantha Tripp Dowless, PA-C   112 mcg at 11/06/15 1028  . LORazepam (ATIVAN) tablet 1 mg  1 mg Oral Q8H PRN Samantha Tripp Dowless, PA-C      . simvastatin (ZOCOR) tablet 40 mg  40 mg Oral q1800 Samantha Tripp Dowless, PA-C    40 mg at 11/05/15 2357  . temazepam (RESTORIL) capsule 30 mg  30 mg Oral QHS Samantha Tripp Dowless, PA-C   30 mg at 11/05/15 2241  . venlafaxine XR (EFFEXOR-XR) 24 hr capsule 150 mg  150 mg Oral BID Samantha Tripp Dowless, PA-C   150 mg at 11/06/15 1028   Current Outpatient Prescriptions  Medication Sig Dispense Refill  . acetaminophen-codeine (TYLENOL #3) 300-30 MG tablet Take 1 tablet by mouth 3 (three) times daily.     Marland Kitchen albuterol (PROVENTIL,VENTOLIN) 90 MCG/ACT inhaler Inhale 2 puffs into the lungs as directed. 1-2 puffs up to three times a day prn (Patient taking differently: Inhale 1-2 puffs into the lungs 3 (three) times daily as needed for wheezing or shortness of breath. 1-2 puffs up to three times a day prn) 17 g 5  . ARIPiprazole (ABILIFY) 30 MG tablet Take 30 mg by mouth daily.     Marland Kitchen aspirin 81 MG tablet Take 81 mg by mouth daily.     Marland Kitchen  baclofen (LIORESAL) 10 MG tablet Take 10 mg by mouth 2 (two) times daily as needed for muscle spasms.     . benztropine (COGENTIN) 2 MG tablet Take 1 tablet by mouth 3 (three) times daily.    . bisacodyl (DULCOLAX) 5 MG EC tablet Take 5 mg by mouth daily as needed for moderate constipation.    . diphenhydrAMINE (BENADRYL) 25 mg capsule Take 25 mg by mouth every 6 (six) hours as needed for itching, allergies or sleep.    Marland Kitchen docusate sodium (COLACE) 100 MG capsule Take 100 mg by mouth daily as needed for mild constipation or moderate constipation.    . gabapentin (NEURONTIN) 800 MG tablet Take 800-1,600 mg by mouth 4 (four) times daily. 1 tab three times daily and 2 tablets at beditme. 30 tablet 2  . levothyroxine (SYNTHROID, LEVOTHROID) 112 MCG tablet Take 112 mcg by mouth daily.      Marland Kitchen LORazepam (ATIVAN) 1 MG tablet Take 1 tablet by mouth 3 (three) times daily as needed for anxiety.     . Oxycodone HCl 10 MG TABS Take 1 tablet by mouth 4 (four) times daily.     . simvastatin (ZOCOR) 40 MG tablet Take 40 mg by mouth daily.    . temazepam (RESTORIL) 30  MG capsule Take 30 mg by mouth at bedtime.    Marland Kitchen venlafaxine (EFFEXOR-XR) 150 MG 24 hr capsule Take 150 mg by mouth 2 (two) times daily. Reported on 10/27/2015    . simvastatin (ZOCOR) 80 MG tablet Take 0.5 tablets (40 mg total) by mouth at bedtime. (Patient not taking: Reported on 10/27/2015) 30 tablet 11    Musculoskeletal: Strength & Muscle Tone: within normal limits Gait & Station: normal Patient leans: N/A  Psychiatric Specialty Exam: Physical Exam  Constitutional: She is oriented to person, place, and time. She appears well-developed and well-nourished.  HENT:  Head: Normocephalic.  Neck: Normal range of motion.  Respiratory: Effort normal.  Musculoskeletal: Normal range of motion.  Neurological: She is alert and oriented to person, place, and time.  Skin: Skin is warm and dry.  Psychiatric: Her speech is normal and behavior is normal. Her mood appears anxious. Thought content is paranoid and delusional. Cognition and memory are normal. She expresses inappropriate judgment. She exhibits a depressed mood.    Review of Systems  Constitutional: Negative.   HENT: Negative.   Eyes: Negative.   Respiratory: Negative.   Cardiovascular: Negative.   Gastrointestinal: Negative.   Genitourinary: Negative.   Musculoskeletal: Negative.   Skin: Negative.   Neurological: Negative.   Endo/Heme/Allergies: Negative.   Psychiatric/Behavioral: Positive for depression.    Blood pressure 118/64, pulse 73, temperature 98.4 F (36.9 C), temperature source Oral, resp. rate 20, SpO2 96 %.There is no height or weight on file to calculate BMI.  General Appearance: Casual  Eye Contact:  Fair  Speech:  Normal Rate  Volume:  Normal  Mood:  Anxious and Depressed  Affect:  Congruent  Thought Process:  Coherent and Descriptions of Associations: Intact  Orientation:  Full (Time, Place, and Person)  Thought Content:  Paranoid Ideation and Rumination  Suicidal Thoughts:  No  Homicidal Thoughts:  No   Memory:  Immediate;   Poor Recent;   Poor Remote;   Poor  Judgement:  Impaired  Insight:  Fair  Psychomotor Activity:  Normal  Concentration:  Concentration: Fair and Attention Span: Fair  Recall:  [employer] of Knowledge:  Fair  Language:  Good  Akathisia:  No  Handed:  Right  AIMS (if indicated):     Assets:  Housing Leisure Time Resilience  ADL's:  Intact  Cognition:  Impaired,  Mild  Sleep:        Treatment Plan Summary: Daily contact with patient to assess and evaluate symptoms and progress in treatment, Medication management and Plan schizoaffective disorder, unspecified type:  -Crisis stabilization -Medication management:  Continue medical medications along with Effexor 150 mg daily for depression, Cogentin 2 mg TID for EPS, and Ativan 1 mg every 8 hours PRN anxiety. -Individual counseling  Disposition: Recommend psychiatric Inpatient admission when medically cleared.  Waylan Boga, NP 11/06/2015 1:19 PM  Patient seen face-to-face for psychiatric evaluation, chart reviewed and case discussed with the physician extender and developed treatment plan. Reviewed the information documented and agree with the treatment plan. Corena Pilgrim, MD

## 2015-11-06 NOTE — ED Notes (Signed)
Pt given graham crackers and juice for a snack.

## 2015-11-06 NOTE — ED Notes (Signed)
Dr. Darleene Cleaver and Roselyn Reef NP at bedside.

## 2015-11-06 NOTE — ED Notes (Signed)
Pt resting comfortably, sitting in the stretcher with sitter at bedside.

## 2015-11-06 NOTE — Progress Notes (Signed)
Pt is very needy and stated, "I have to go some place. My apt is a mess like I am a hoarder." "I keep ffalling. Look at all my bruises. " 'I told the agency I did not want any help but I was not in my right mind." Pt is very manic.(3"50pm) Report to oncoming shift (3:55pm)

## 2015-11-06 NOTE — ED Notes (Signed)
Pt chewed on the ativan instead of swallowing it.  States "it's out of desperation."

## 2015-11-06 NOTE — ED Notes (Signed)
Meal given. Sitter at bedside

## 2015-11-06 NOTE — ED Notes (Signed)
Patient states she can walk if she gets "pain meds"

## 2015-11-07 DIAGNOSIS — F29 Unspecified psychosis not due to a substance or known physiological condition: Secondary | ICD-10-CM | POA: Diagnosis not present

## 2015-11-07 NOTE — ED Notes (Signed)
Patient offered assistance to restroom, patient refused at this time.  Blanket and fluids offered.  Patient currently watching TV from her bed.  Will continue to monitor.

## 2015-11-07 NOTE — ED Notes (Signed)
Patient awake in her room.  Patient states to this RN that "I am feeling very anxious, not as bad as when I got here but, it's bad".

## 2015-11-07 NOTE — ED Notes (Signed)
Patient awake sitting on bedside.  Patient provided with graham crackers and juice.

## 2015-11-07 NOTE — ED Notes (Signed)
Patient sleeping, breathing even and unlabored, NAD at this time, will continue to monitor 

## 2015-11-07 NOTE — BH Assessment (Signed)
Wise Assessment Progress Note   This Probation officer spoke with patient this date to monitor progress/mental health status check. Patient states she is feeling "much better" and was time/pace oriented although did report her anxiety to be at a 8. Patient stated she is concerned over "what happens next." This writer explained to patient that she is currently awaiting placement at different facilities with patient agreeing that she requires continued care. Patient states "I am still not right" and presented with a somewhat anxious affect. Patient stated she has ongoing pain management issues and lacks the mobility "to get around without falling". Patient stated she feels her medications are working but might need some adjusting before she is discharged. Patient continues to await placement as appropriate bed placement is investigated.

## 2015-11-08 DIAGNOSIS — F17210 Nicotine dependence, cigarettes, uncomplicated: Secondary | ICD-10-CM

## 2015-11-08 DIAGNOSIS — Z880 Allergy status to penicillin: Secondary | ICD-10-CM

## 2015-11-08 DIAGNOSIS — Z79899 Other long term (current) drug therapy: Secondary | ICD-10-CM

## 2015-11-08 DIAGNOSIS — F259 Schizoaffective disorder, unspecified: Secondary | ICD-10-CM | POA: Diagnosis not present

## 2015-11-08 DIAGNOSIS — F29 Unspecified psychosis not due to a substance or known physiological condition: Secondary | ICD-10-CM | POA: Diagnosis not present

## 2015-11-08 DIAGNOSIS — Z888 Allergy status to other drugs, medicaments and biological substances status: Secondary | ICD-10-CM

## 2015-11-08 NOTE — ED Notes (Signed)
Patient upset about being discharged to home.  Advised patient doctor states she is medically cleared/stable to go home. Per social worker patient has patient has private duty services, RN and other services available to her. [employer] notified for pick up

## 2015-11-08 NOTE — ED Notes (Signed)
Patient walked to bathroom across the hall to take a shower. She walked back to her room afterwards. She stated she had pain while walking but it did not interfere with her ability to successfully walk to and from her room.

## 2015-11-08 NOTE — ED Notes (Signed)
Spoke to patient about discharge and asked her how she will be getting home today, because the doctor has written for her to be discharged to home today.  Patient upset and  states she can not go home because she is unable to walk and can not get around her apartment. Patient also states her all her medications have not been reordered and she is not ready to go home because she knows she will be right back.  Notified social work to speak with patient

## 2015-11-08 NOTE — Discharge Instructions (Signed)
Schizoaffective Disorder  Schizoaffective disorder (ScAD) is a mental illness. It causes symptoms that are a mixture of schizophrenia (a psychotic disorder) and an affective (mood) disorder. The schizophrenic symptoms may include delusions, hallucinations, or odd behavior. The mood symptoms may be similar to major depression or bipolar disorder. ScAD may interfere with personal relationships or normal daily activities. People with ScAD are at increased risk for job loss, social isolation, physical health problems, anxiety and substance use disorders, and suicide.  ScAD usually occurs in cycles. Periods of severe symptoms are followed by periods of  less severe symptoms or improvement. The illness affects men and women equally but usually appears at an earlier age (teenage or early adult years) in men. People who have family members with schizophrenia, bipolar disorder, or ScAD are at higher risk of developing ScAD.  SYMPTOMS   At any one time, people with ScAD may have psychotic symptoms only or both psychotic and mood symptoms. The psychotic symptoms include one or more of the following:  · Hearing, seeing, or feeling things that are not there (hallucinations).    · Having fixed, false beliefs (delusions). The delusions usually are of being attacked, harassed, cheated, persecuted, or conspired against (paranoid delusions).  · Speaking in a way that makes no sense to others (disorganized speech).  The psychotic symptoms of ScAD may also include confusing or odd behavior or any of the negative symptoms of schizophrenia. These include loss of motivation for normal daily activities, such as bathing or grooming, withdrawal from other people, and lack of emotions.     The mood symptoms of ScAD occur more often than not. They resemble major depressive disorder or bipolar mania. Symptoms of major depression include depressed mood and four or more of the following:  · Loss of interest in usually pleasurable activities  (anhedonia).  · Sleeping more or less than normal.  · Feeling worthless or excessively guilty.  · Lack of energy or motivation.  · Trouble concentrating.  · Eating more or less than usual.  · Thinking a lot about death or suicide.  Symptoms of bipolar mania include abnormally elevated or irritable mood and increased energy or activity, plus three or more of the following:    · More confidence than normal or feeling that you are able to do anything (grandiosity).  · Feeling rested with less sleep than normal.    · Being easily distracted.    · Talking more than usual or feeling pressured to keep talking.    · Feeling that your thoughts are racing.  · Engaging in high-risk activities such as buying sprees or foolish business decisions.  DIAGNOSIS   ScAD is diagnosed through an assessment by your health care provider. Your health care provider will observe and ask questions about your thoughts, behavior, mood, and ability to function in daily life. Your health care provider may also ask questions about your medical history and use of drugs, including prescription medicines. Your health care provider may also order blood tests and imaging exams. Certain medical conditions and substances can cause symptoms that resemble ScAD. Your health care provider may refer you to a mental health specialist for evaluation.   ScAD is divided into two types. The depressive type is diagnosed if your mood symptoms are limited to major depression. The bipolar type is diagnosed if your mood symptoms are manic or a mixture of manic and depressive symptoms  TREATMENT   ScAD is usually a lifelong illness. Long-term treatment is necessary. The following treatments are available:  · Medicine. Different types of   medicine are used to treat ScAD. The exact combination depends on the type and severity of your symptoms. Antipsychotic medicine is used to control psychotic symptoms such as delusions, paranoia, and hallucinations. Mood stabilizers can  even the highs and lows of bipolar manic mood swings. Antidepressant medicines are used to treat major depressive symptoms.  · Counseling or talk therapy. Individual, group, or family counseling may be helpful in providing education, support, and guidance. Many people with ScAD also benefit from social skills and job skills (vocational) training.  A combination of medicine and counseling is usually best for managing the disorder over time. A procedure in which electricity is applied to the brain through the scalp (electroconvulsive therapy) may be used to treat people with severe manic symptoms that do not respond to medicine and counseling.  HOME CARE INSTRUCTIONS   · Take all your medicine as prescribed.  · Check with your health care provider before starting new prescription or over-the-counter medicines.  · Keep all follow up appointments with your health care provider.  SEEK MEDICAL CARE IF:   · If you are not able to take your medicines as prescribed.  · If your symptoms get worse.  SEEK IMMEDIATE MEDICAL CARE IF:   · You have serious thoughts about hurting yourself or others.     This information is not intended to replace advice given to you by your health care provider. Make sure you discuss any questions you have with your health care provider.     Document Released: 08/08/2006 Document Revised: 04/18/2014 Document Reviewed: 11/09/2012  Elsevier Interactive Patient Education ©2016 [employer]

## 2015-11-08 NOTE — [facility] Suicide Risk Assessment (Signed)
Suicide Risk Assessment  Discharge Assessment   [facility] Discharge Suicide Risk Assessment   Principal Problem: Schizoaffective disorder, unspecified [facility]) Discharge Diagnoses:  Patient Active Problem List   Diagnosis Date Noted  . Schizoaffective disorder, unspecified ([place]) [F25.9] 01/20/2011    Priority: High  . Delirium [R41.0] 09/05/2014  . Acute encephalopathy [G93.40] 09/05/2014  . Chronic pain syndrome [G89.4] 09/05/2014  . Diarrhea [R19.7] 02/14/2011  . Seizure disorder ([place]) [id number] 09/02/2010  . Fracture, humerus, head [S42.293A] 11/05/2009  . CIGARETTE SMOKER [F17.200] 09/05/2007  . CHRONIC AIRWAY OBSTRUCTION NEC [J44.9] 07/11/2007  . Hypothyroidism [E03.9] 06/08/2006  . Hyperlipemia [E78.5] 06/08/2006    Total Time spent with patient: 30 minutes   Musculoskeletal: Strength & Muscle Tone: within normal limits Gait & Station: normal Patient leans: N/A  Psychiatric Specialty Exam: Physical Exam  Constitutional: She is oriented to person, place, and time. She appears well-developed and well-nourished.  HENT:  Head: Normocephalic.  Neck: Normal range of motion.  Respiratory: Effort normal.  Musculoskeletal: Normal range of motion.  Neurological: She is alert and oriented to person, place, and time.  Skin: Skin is warm and dry.  Psychiatric: She has a normal mood and affect. Her speech is normal and behavior is normal. Thought content normal. Cognition and memory are normal.    Review of Systems  Constitutional: Negative.   HENT: Negative.   Eyes: Negative.   Respiratory: Negative.   Cardiovascular: Negative.   Gastrointestinal: Negative.   Genitourinary: Negative.   Musculoskeletal: Negative.   Skin: Negative.   Neurological: Negative.   Endo/Heme/Allergies: Negative.   Psychiatric/Behavioral: Positive for substance abuse.    Blood pressure 109/78, pulse 100, temperature 98.2 F (36.8 C), temperature source Oral, resp. rate 18, SpO2 95 %.There is no  height or weight on file to calculate BMI.  General Appearance: Casual  Eye Contact:  Good  Speech:  Normal Rate  Volume:  Normal  Mood:  Euthymic  Affect:  Congruent  Thought Process:  Coherent and Descriptions of Associations: Intact  Orientation:  Full (Time, Place, and Person)  Thought Content:  WDL  Suicidal Thoughts:  No  Homicidal Thoughts:  No  Memory:  Immediate;   Good Recent;   Good Remote;   Good  Judgement:  Fair  Insight:  Fair  Psychomotor Activity:  Normal  Concentration:  Concentration: Good and Attention Span: Good  Recall:  Good  Fund of Knowledge:  Good  Language:  Good  Akathisia:  No  Handed:  Right  AIMS (if indicated):     Assets:  Housing Leisure Time Physical Health Resilience  ADL's:  Intact  Cognition:  WNL  Sleep:          Mental Status Per Nursing Assessment::   On Admission:   psychosis  Demographic Factors:  Age 65 or older, Caucasian and Living alone  Loss Factors: NA  Historical Factors: NA  Risk Reduction Factors:   Sense of responsibility to family, Positive social support and Positive therapeutic relationship  Continued Clinical Symptoms:  None  Cognitive Features That Contribute To Risk:  None    Suicide Risk:  Minimal: No identifiable suicidal ideation.  Patients presenting with no risk factors but with morbid ruminations; may be classified as minimal risk based on the severity of the depressive symptoms    Plan Of Care/Follow-up recommendations:  Activity:  as tolerated Diet:  heart healthy diet  LORD, JAMISON, NP 11/08/2015, 11:12 AM

## 2015-11-08 NOTE — Clinical Social Work Note (Signed)
CSW met with patient to provide taxi voucher home today.  Patient was discharged by psychiatry today and ready to go back home.  Patient had been at hospital last week and per case management note on 11/05/15 was set up with in home services RN/ and SW, provided a wheelchair and has  private duty services for 67.5 hours a week.  Patient also has access to nutrition services where she resides.  Voucher provided to RN who is completing discharge paperwork.  Dede Query, LCSW [place] Worker - Weekend Coverage cell #: [phone]

## 2015-11-08 NOTE — Consult Note (Signed)
[facility]iatry Consult   Reason for Consult:  Psychosis  Referring Physician:  EDP Patient Identification: Khyler Martzall MRN:  [id number] Principal Diagnosis: Schizoaffective disorder, unspecified ([place]) Diagnosis:   Patient Active Problem List   Diagnosis Date Noted  . Schizoaffective disorder, unspecified ([place]) [F25.9] 01/20/2011    Priority: High  . Delirium [R41.0] 09/05/2014  . Acute encephalopathy [G93.40] 09/05/2014  . Chronic pain syndrome [G89.4] 09/05/2014  . Diarrhea [R19.7] 02/14/2011  . Seizure disorder ([place]) [id number] 09/02/2010  . Fracture, humerus, head [S42.293A] 11/05/2009  . CIGARETTE SMOKER [F17.200] 09/05/2007  . CHRONIC AIRWAY OBSTRUCTION NEC [J44.9] 07/11/2007  . Hypothyroidism [E03.9] 06/08/2006  . Hyperlipemia [E78.5] 06/08/2006    Total Time spent with patient: 30 minutes  Subjective:   Bobby Murrah is a 65 y.o. female patient does not warrant admission.  HPI:  65 yo female who had been off her medications for two weeks prior to admission, agreeable to go stabilize in a hospital for a couple of days on her medications but placement not found.  Meanwhile, her medications were restarted in the ED and she has stabilized.  She was watching television with her legs crossed, pleasant but demanded her narcotics (opiates).  Her concern initially was falling at home but now it has come to our attention the most likely cause was her heavy use of opiates and benzodiazepines, encouraged her to avoid these.  No suicidal/homicidal ideations, hallucinations, or withdrawal symptoms.  She was not interested in any assistive living facility after discharge as she has her own apartment.   Past Psychiatric History: schizoaffective disorder  Risk to Self: Suicidal Ideation: No Suicidal Intent: No Is patient at risk for suicide?: No Suicidal Plan?: No Access to Means: No What has been your use of drugs/alcohol within the last 12 months?:  (denies ) How many times?:   (0) Other Self Harm Risks:  (n/a) Triggers for Past Attempts:  (no prevous attempts or gestures) Intentional Self Injurious Behavior: None Risk to Others: Homicidal Ideation: No Thoughts of Harm to Others: No Current Homicidal Intent: No Current Homicidal Plan: No Access to Homicidal Means: No Identified Victim:  (n/a) History of harm to others?: No Assessment of Violence: None Noted Violent Behavior Description:  (patient is calm and cooperative ) Does patient have access to weapons?: No Criminal Charges Pending?: No Does patient have a court date: No Prior Inpatient Therapy: Prior Inpatient Therapy: Yes Prior Therapy Dates:  ("over 20 yrs ago") Prior Therapy Facilty/Provider(s):  ("I don't even remember where I was hospitalized") Reason for Treatment:  ("I think it's because I had some depression") Prior Outpatient Therapy: Prior Outpatient Therapy: No Prior Therapy Dates:  (n/a) Prior Therapy Facilty/Provider(s):  (n/a) Reason for Treatment:  (n/a) Does patient have an ACCT team?: No Does patient have Intensive In-House Services?  : No Does patient have [facility] services? : No Does patient have P4CC services?: No  Past Medical History:  Past Medical History:  Diagnosis Date  . Arm pain   . Chronic pain   . COPD (chronic obstructive pulmonary disease) ([place])   . Coronary artery disease   . Depression   . Dystonia   . Epilepsy ([place])   . High cholesterol   . Humerus fracture   . Hypothyroid   . Leukemia ([place])   . Myocardial infarct ([place])   . Schizophrenia ([place])   . Seizure [facility])     Past Surgical History:  Procedure Laterality Date  . EXTERNAL EAR SURGERY    . HUMERUS  FRACTURE SURGERY    . SHOULDER SURGERY     Family History: History reviewed. No pertinent family history. Family Psychiatric  History: none Social History:  History  Alcohol Use No     History  Drug Use No    Social History   Social History  . Marital status: Single    Spouse name: N/A  .  Number of children: N/A  . Years of education: N/A   Social History Main Topics  . Smoking status: Current Every Day Smoker    Packs/day: 2.00    Years: 41.00    Types: Cigarettes  . Smokeless tobacco: None  . Alcohol use No  . Drug use: No  . Sexual activity: No   Other Topics Concern  . None   Social History Narrative  . None   Additional Social History:    Allergies:   Allergies  Allergen Reactions  . Amoxicillin Anaphylaxis  . Penicillins Anaphylaxis    Has patient had a PCN reaction causing immediate rash, facial/tongue/throat swelling, SOB or lightheadedness with hypotension: yes Has patient had a PCN reaction causing severe rash involving mucus membranes or skin necrosis: no Has patient had a PCN reaction that required hospitalization: yes Has patient had a PCN reaction occurring within the last 10 years: no If all of the above answers are "NO", then may proceed with Cephalosporin use.   Marland Kitchen Phenytoin Other (See Comments)    Other Reaction: CNS Disorder  . Dilaudid [Hydromorphone Hcl] Other (See Comments)    Psychosis (per patient)  . Haldol [Haloperidol]     Psychosis per caregiver  . Morphine And Related Other (See Comments)    psychosis    Labs: No results found for this or any previous visit (from the past 48 hour(s)).  Current Facility-Administered Medications  Medication Dose Route Frequency Provider Last Rate Last Dose  . ARIPiprazole (ABILIFY) tablet 30 mg  30 mg Oral Daily Samantha Tripp Dowless, PA-C   30 mg at 11/08/15 0900  . baclofen (LIORESAL) tablet 10 mg  10 mg Oral BID PRN Dondra Spry Dowless, PA-C   10 mg at 11/08/15 0855  . benztropine (COGENTIN) tablet 2 mg  2 mg Oral TID Samantha Tripp Dowless, PA-C   2 mg at 11/08/15 0900  . gabapentin (NEURONTIN) capsule 1,600 mg  1,600 mg Oral QHS Charlesetta Shanks, MD   1,600 mg at 11/07/15 2211  . gabapentin (NEURONTIN) capsule 800 mg  800 mg Oral 3 times per day Charlesetta Shanks, MD   800 mg at 11/08/15  0900  . levothyroxine (SYNTHROID, LEVOTHROID) tablet 112 mcg  112 mcg Oral QAC breakfast Samantha Tripp Dowless, PA-C   112 mcg at 11/08/15 [id number]  . simvastatin (ZOCOR) tablet 40 mg  40 mg Oral q1800 Samantha Tripp Dowless, PA-C   40 mg at 11/07/15 1702  . temazepam (RESTORIL) capsule 30 mg  30 mg Oral QHS Samantha Tripp Dowless, PA-C   30 mg at 11/07/15 2357  . venlafaxine XR (EFFEXOR-XR) 24 hr capsule 150 mg  150 mg Oral BID Samantha Tripp Dowless, PA-C   150 mg at 11/08/15 0900   Current Outpatient Prescriptions  Medication Sig Dispense Refill  . acetaminophen-codeine (TYLENOL #3) 300-30 MG tablet Take 1 tablet by mouth 3 (three) times daily.     Marland Kitchen albuterol (PROVENTIL,VENTOLIN) 90 MCG/ACT inhaler Inhale 2 puffs into the lungs as directed. 1-2 puffs up to three times a day prn (Patient taking differently: Inhale 1-2 puffs into the lungs 3 (three) times  daily as needed for wheezing or shortness of breath. 1-2 puffs up to three times a day prn) 17 g 5  . ARIPiprazole (ABILIFY) 30 MG tablet Take 30 mg by mouth daily.     Marland Kitchen aspirin 81 MG tablet Take 81 mg by mouth daily.     . baclofen (LIORESAL) 10 MG tablet Take 10 mg by mouth 2 (two) times daily as needed for muscle spasms.     . benztropine (COGENTIN) 2 MG tablet Take 1 tablet by mouth 3 (three) times daily.    . bisacodyl (DULCOLAX) 5 MG EC tablet Take 5 mg by mouth daily as needed for moderate constipation.    . diphenhydrAMINE (BENADRYL) 25 mg capsule Take 25 mg by mouth every 6 (six) hours as needed for itching, allergies or sleep.    Marland Kitchen docusate sodium (COLACE) 100 MG capsule Take 100 mg by mouth daily as needed for mild constipation or moderate constipation.    . gabapentin (NEURONTIN) 800 MG tablet Take 800-1,600 mg by mouth 4 (four) times daily. 1 tab three times daily and 2 tablets at beditme. 30 tablet 2  . levothyroxine (SYNTHROID, LEVOTHROID) 112 MCG tablet Take 112 mcg by mouth daily.      Marland Kitchen LORazepam (ATIVAN) 1 MG tablet Take 1  tablet by mouth 3 (three) times daily as needed for anxiety.     . Oxycodone HCl 10 MG TABS Take 1 tablet by mouth 4 (four) times daily.     . simvastatin (ZOCOR) 40 MG tablet Take 40 mg by mouth daily.    . temazepam (RESTORIL) 30 MG capsule Take 30 mg by mouth at bedtime.    Marland Kitchen venlafaxine (EFFEXOR-XR) 150 MG 24 hr capsule Take 150 mg by mouth 2 (two) times daily. Reported on 10/27/2015    . simvastatin (ZOCOR) 80 MG tablet Take 0.5 tablets (40 mg total) by mouth at bedtime. (Patient not taking: Reported on 10/27/2015) 30 tablet 11    Musculoskeletal: Strength & Muscle Tone: within normal limits Gait & Station: normal Patient leans: N/A  Psychiatric Specialty Exam: Physical Exam  Constitutional: She is oriented to person, place, and time. She appears well-developed and well-nourished.  HENT:  Head: Normocephalic.  Neck: Normal range of motion.  Respiratory: Effort normal.  Musculoskeletal: Normal range of motion.  Neurological: She is alert and oriented to person, place, and time.  Skin: Skin is warm and dry.  Psychiatric: She has a normal mood and affect. Her speech is normal and behavior is normal. Thought content normal. Cognition and memory are normal.    Review of Systems  Constitutional: Negative.   HENT: Negative.   Eyes: Negative.   Respiratory: Negative.   Cardiovascular: Negative.   Gastrointestinal: Negative.   Genitourinary: Negative.   Musculoskeletal: Negative.   Skin: Negative.   Neurological: Negative.   Endo/Heme/Allergies: Negative.   Psychiatric/Behavioral: Positive for substance abuse.    Blood pressure 109/78, pulse 100, temperature 98.2 F (36.8 C), temperature source Oral, resp. rate 18, SpO2 95 %.There is no height or weight on file to calculate BMI.  General Appearance: Casual  Eye Contact:  Good  Speech:  Normal Rate  Volume:  Normal  Mood:  Euthymic  Affect:  Congruent  Thought Process:  Coherent and Descriptions of Associations: Intact   Orientation:  Full (Time, Place, and Person)  Thought Content:  WDL  Suicidal Thoughts:  No  Homicidal Thoughts:  No  Memory:  Immediate;   Good Recent;   Good Remote;  Good  Judgement:  Fair  Insight:  Fair  Psychomotor Activity:  Normal  Concentration:  Concentration: Good and Attention Span: Good  Recall:  Good  Fund of Knowledge:  Good  Language:  Good  Akathisia:  No  Handed:  Right  AIMS (if indicated):     Assets:  Housing Leisure Time Physical Health Resilience  ADL's:  Intact  Cognition:  WNL  Sleep:        Treatment Plan Summary: Daily contact with patient to assess and evaluate symptoms and progress in treatment, Medication management and Plan schizoaffective disorder, bipolar type:  -Crisis stabilization -Medication management:  Continue her medical medications along with Effexor 150 mg BID for depression, Restoril 30 mg at bedtime for sleep (encouraged to taper off), Abilify 30 mg daily for mood, and Cogentin 2 mg TID for EPS. -Individual counseling  Disposition: No evidence of imminent risk to self or others at present.    Waylan Boga, NP 11/08/2015 11:02 AM

## 2015-11-08 NOTE — [facility] Counselor (Signed)
Spoke w/ pt and let her know she has been cleared psychiatrically and will be discharged today. Pt sts she can't go home because she can't walk. She expressed concern about home health agency and requests to speak with "other social worker" in order to get home health agency list. She reports concern that she isn't being given all her prescribed meds. Per discussion with Waylan Boga DNP, Lord reports that pt's recent falls have likely been caused by overuse of benzodiazepines or opiates. Writer relayed conversation with Reita Cliche DNP to pt.  Writer asked CSW to speak with pt.   Arnold Long, [place] Therapeutic Triage Specialist

## 2015-11-08 NOTE — ED Notes (Signed)
Pt urinated on floor on way to bathroom. Washcloths and new scrubs given.

## 2016-06-24 ENCOUNTER — Emergency Department (HOSPITAL_COMMUNITY): Payer: Medicare Other

## 2016-06-24 ENCOUNTER — Emergency Department (HOSPITAL_COMMUNITY)
Admission: EM | Admit: 2016-06-24 | Discharge: 2016-06-25 | Disposition: A | Discharge status: Home / Self Care | Payer: Medicare Other | Attending: Emergency Medicine | Admitting: Emergency Medicine

## 2016-06-24 ENCOUNTER — Encounter (HOSPITAL_COMMUNITY): Payer: Self-pay | Admitting: *Deleted

## 2016-06-24 DIAGNOSIS — Z79899 Other long term (current) drug therapy: Secondary | ICD-10-CM | POA: Diagnosis not present

## 2016-06-24 DIAGNOSIS — R4182 Altered mental status, unspecified: Secondary | ICD-10-CM | POA: Diagnosis not present

## 2016-06-24 DIAGNOSIS — F17210 Nicotine dependence, cigarettes, uncomplicated: Secondary | ICD-10-CM | POA: Insufficient documentation

## 2016-06-24 DIAGNOSIS — I252 Old myocardial infarction: Secondary | ICD-10-CM | POA: Diagnosis not present

## 2016-06-24 DIAGNOSIS — I2510 Atherosclerotic heart disease of native coronary artery without angina pectoris: Secondary | ICD-10-CM | POA: Diagnosis not present

## 2016-06-24 DIAGNOSIS — J449 Chronic obstructive pulmonary disease, unspecified: Secondary | ICD-10-CM | POA: Diagnosis not present

## 2016-06-24 DIAGNOSIS — R451 Restlessness and agitation: Secondary | ICD-10-CM | POA: Diagnosis present

## 2016-06-24 DIAGNOSIS — E039 Hypothyroidism, unspecified: Secondary | ICD-10-CM | POA: Insufficient documentation

## 2016-06-24 DIAGNOSIS — Z7982 Long term (current) use of aspirin: Secondary | ICD-10-CM | POA: Insufficient documentation

## 2016-06-24 DIAGNOSIS — F259 Schizoaffective disorder, unspecified: Secondary | ICD-10-CM | POA: Diagnosis not present

## 2016-06-24 LAB — COMPREHENSIVE METABOLIC PANEL
ALBUMIN: 4.2 g/dL (ref 3.5–5.0)
ALK PHOS: 82 U/L (ref 38–126)
ALT: 20 U/L (ref 14–54)
ANION GAP: 10 (ref 5–15)
AST: 36 U/L (ref 15–41)
BILIRUBIN TOTAL: 1.5 mg/dL — AB (ref 0.3–1.2)
BUN: 18 mg/dL (ref 6–20)
CALCIUM: 8.9 mg/dL (ref 8.9–10.3)
CO2: 25 mmol/L (ref 22–32)
Chloride: 102 mmol/L (ref 101–111)
Creatinine, Ser: 0.98 mg/dL (ref 0.44–1.00)
GFR, EST NON AFRICAN AMERICAN: 58 mL/min — AB (ref >60)
GLUCOSE: 105 mg/dL — AB (ref 65–99)
Potassium: 4.3 mmol/L (ref 3.5–5.1)
SODIUM: 137 mmol/L (ref 135–145)
TOTAL PROTEIN: 8.2 g/dL — AB (ref 6.5–8.1)

## 2016-06-24 LAB — CBC WITH DIFFERENTIAL/PLATELET
Basophils Absolute: 0.1 10*3/uL (ref 0.0–0.1)
Basophils Relative: 0 %
EOS ABS: 0.2 10*3/uL (ref 0.0–0.7)
Eosinophils Relative: 1 %
HCT: 45.7 % (ref 36.0–46.0)
HEMOGLOBIN: 14.8 g/dL (ref 12.0–15.0)
LYMPHS ABS: 2.7 10*3/uL (ref 0.7–4.0)
LYMPHS PCT: 18 %
MCH: 29.8 pg (ref 26.0–34.0)
MCHC: 32.4 g/dL (ref 30.0–36.0)
MCV: 92.1 fL (ref 78.0–100.0)
MONOS PCT: 6 %
Monocytes Absolute: 0.9 10*3/uL (ref 0.1–1.0)
Neutro Abs: 10.9 10*3/uL — ABNORMAL HIGH (ref 1.7–7.7)
Neutrophils Relative %: 75 %
Platelets: 375 10*3/uL (ref 150–400)
RBC: 4.96 MIL/uL (ref 3.87–5.11)
RDW: 13.9 % (ref 11.5–15.5)
WBC: 14.7 10*3/uL — AB (ref 4.0–10.5)

## 2016-06-24 LAB — ACETAMINOPHEN LEVEL: Acetaminophen (Tylenol), Serum: <10 ug/mL — ABNORMAL LOW (ref 10–30)

## 2016-06-24 LAB — SALICYLATE LEVEL: Salicylate Lvl: <7 mg/dL (ref 2.8–30.0)

## 2016-06-24 MED ORDER — ARIPIPRAZOLE 10 MG PO TABS
30.0000 mg | ORAL_TABLET | Freq: Every day | ORAL | Status: DC
  Administered 2016-06-25: 30 mg via ORAL
  Filled 2016-06-24: qty 3

## 2016-06-24 MED ORDER — DOCUSATE SODIUM 100 MG PO CAPS: 100.0000 mg | ORAL_CAPSULE | Freq: Every day | ORAL | Status: DC | PRN

## 2016-06-24 MED ORDER — TEMAZEPAM 15 MG PO CAPS
30.0000 mg | ORAL_CAPSULE | Freq: Every day | ORAL | Status: DC
  Administered 2016-06-24: 30 mg via ORAL
  Filled 2016-06-24: qty 2

## 2016-06-24 MED ORDER — PROPRANOLOL HCL 10 MG PO TABS
10.0000 mg | ORAL_TABLET | Freq: Two times a day (BID) | ORAL | Status: DC
  Administered 2016-06-25 (×2): 10 mg via ORAL
  Filled 2016-06-24 (×2): qty 1

## 2016-06-24 MED ORDER — ALBUTEROL 90 MCG/ACT IN AERS: 2.0000 | INHALATION_SPRAY | RESPIRATORY_TRACT | Status: DC

## 2016-06-24 MED ORDER — ALBUTEROL SULFATE (2.5 MG/3ML) 0.083% IN NEBU: 2.5000 mg | INHALATION_SOLUTION | Freq: Four times a day (QID) | RESPIRATORY_TRACT | Status: DC | PRN

## 2016-06-24 MED ORDER — BISACODYL 5 MG PO TBEC
5.0000 mg | DELAYED_RELEASE_TABLET | Freq: Every day | ORAL | Status: DC | PRN
  Filled 2016-06-24: qty 1

## 2016-06-24 MED ORDER — GABAPENTIN 400 MG PO CAPS
800.0000 mg | ORAL_CAPSULE | Freq: Three times a day (TID) | ORAL | Status: DC
  Administered 2016-06-25: 800 mg via ORAL
  Filled 2016-06-24: qty 2

## 2016-06-24 MED ORDER — DARIFENACIN HYDROBROMIDE ER 7.5 MG PO TB24
7.5000 mg | ORAL_TABLET | Freq: Every day | ORAL | Status: DC
  Administered 2016-06-25: 7.5 mg via ORAL
  Filled 2016-06-24: qty 1

## 2016-06-24 MED ORDER — LEVOTHYROXINE SODIUM 88 MCG PO TABS
88.0000 ug | ORAL_TABLET | Freq: Every day | ORAL | Status: DC
  Administered 2016-06-25: 88 ug via ORAL
  Filled 2016-06-24: qty 1

## 2016-06-24 MED ORDER — GABAPENTIN 400 MG PO CAPS
1600.0000 mg | ORAL_CAPSULE | Freq: Every day | ORAL | Status: DC
  Administered 2016-06-24: 1600 mg via ORAL
  Filled 2016-06-24: qty 4

## 2016-06-24 MED ORDER — VENLAFAXINE HCL ER 75 MG PO CP24
150.0000 mg | ORAL_CAPSULE | Freq: Two times a day (BID) | ORAL | Status: DC
  Administered 2016-06-24 – 2016-06-25 (×2): 150 mg via ORAL
  Filled 2016-06-24 (×2): qty 2

## 2016-06-24 MED ORDER — AMANTADINE HCL 100 MG PO CAPS
100.0000 mg | ORAL_CAPSULE | Freq: Every day | ORAL | Status: DC
  Administered 2016-06-25: 100 mg via ORAL
  Filled 2016-06-24: qty 1

## 2016-06-24 MED ORDER — ASPIRIN EC 81 MG PO TBEC
81.0000 mg | DELAYED_RELEASE_TABLET | Freq: Every day | ORAL | Status: DC
  Administered 2016-06-25: 81 mg via ORAL
  Filled 2016-06-24: qty 1

## 2016-06-24 MED ORDER — BENZTROPINE MESYLATE 1 MG PO TABS
2.0000 mg | ORAL_TABLET | Freq: Three times a day (TID) | ORAL | Status: DC
  Administered 2016-06-24 – 2016-06-25 (×2): 2 mg via ORAL
  Filled 2016-06-24 (×2): qty 2

## 2016-06-24 MED ORDER — LORAZEPAM 1 MG PO TABS
1.0000 mg | ORAL_TABLET | Freq: Two times a day (BID) | ORAL | Status: DC
  Administered 2016-06-24 – 2016-06-25 (×2): 1 mg via ORAL
  Filled 2016-06-24 (×2): qty 1

## 2016-06-24 MED ORDER — SIMVASTATIN 40 MG PO TABS
40.0000 mg | ORAL_TABLET | Freq: Every day | ORAL | Status: DC
  Administered 2016-06-25: 40 mg via ORAL
  Filled 2016-06-24: qty 1

## 2016-06-24 MED ORDER — BACLOFEN 10 MG PO TABS
10.0000 mg | ORAL_TABLET | Freq: Three times a day (TID) | ORAL | Status: DC
  Administered 2016-06-24 – 2016-06-25 (×3): 10 mg via ORAL
  Filled 2016-06-24 (×3): qty 1
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
## 2016-06-24 NOTE — ED Notes (Signed)
Pt up OOB, wrapping blanket around sharps container & stating "I'm talking to him."  Pt redirected to get back in bed.  Pt complied.
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
## 2016-06-24 NOTE — ED Notes (Signed)
Pt stated "eyeballs of the world.  They're watching [place], daddy."

## 2016-06-24 NOTE — ED Notes (Signed)
ED Provider at bedside. 

## 2016-06-24 NOTE — ED Notes (Addendum)
Pt asked for urine specimen, and one could not be provided.

## 2016-06-24 NOTE — ED Notes (Signed)
Pt looking @ sharps box & whispering "It's Christmas morning."

## 2016-06-24 NOTE — ED Notes (Signed)
Pt is whispering to herself. She stood up and attempted to reach in the sharps container.

## 2016-06-24 NOTE — ED Notes (Signed)
Pt standing and whispering and making hand gestures to "something" in the room.  Pt states she needs to pee but refuses to get up to use the restroom. Will try again.
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
## 2016-06-24 NOTE — ED Notes (Signed)
Pt sat up saying, "it's Christmas, come and get me!"

## 2016-06-24 NOTE — ED Notes (Signed)
Pt sat up and seemed to want something. When in the room, the pt was asked if she needed anything. In an unthreatening tone she said, "leave me the hell alone, I'm down for the count."

## 2016-06-24 NOTE — ED Notes (Signed)
Pt whispering "I'm the [employer]".

## 2016-06-25 ENCOUNTER — Encounter (HOSPITAL_COMMUNITY): Payer: Self-pay | Admitting: Registered Nurse

## 2016-06-25 DIAGNOSIS — Z79899 Other long term (current) drug therapy: Secondary | ICD-10-CM | POA: Diagnosis not present

## 2016-06-25 DIAGNOSIS — F17210 Nicotine dependence, cigarettes, uncomplicated: Secondary | ICD-10-CM

## 2016-06-25 DIAGNOSIS — F259 Schizoaffective disorder, unspecified: Secondary | ICD-10-CM | POA: Diagnosis not present

## 2016-06-25 DIAGNOSIS — Z7982 Long term (current) use of aspirin: Secondary | ICD-10-CM

## 2016-06-25 DIAGNOSIS — R4182 Altered mental status, unspecified: Secondary | ICD-10-CM | POA: Diagnosis not present

## 2016-06-25 LAB — URINALYSIS, ROUTINE W REFLEX MICROSCOPIC
BILIRUBIN URINE: NEGATIVE
GLUCOSE, UA: NEGATIVE mg/dL
Hgb urine dipstick: NEGATIVE
KETONES UR: 20 mg/dL — AB
NITRITE: NEGATIVE
PH: 5 (ref 5.0–8.0)
PROTEIN: NEGATIVE mg/dL
Specific Gravity, Urine: 1.027 (ref 1.005–1.030)

## 2016-06-25 LAB — RAPID URINE DRUG SCREEN, HOSP PERFORMED
Amphetamines: NOT DETECTED
BARBITURATES: NOT DETECTED
Benzodiazepines: POSITIVE — AB
Cocaine: NOT DETECTED
Opiates: POSITIVE — AB
Tetrahydrocannabinol: NOT DETECTED

## 2016-06-25 MED ORDER — DARIFENACIN HYDROBROMIDE ER 7.5 MG PO TB24: 7.5000 mg | ORAL_TABLET | Freq: Every day | ORAL | 0 refills | Status: DC

## 2016-06-25 MED ORDER — AMANTADINE HCL 100 MG PO CAPS: 100.0000 mg | ORAL_CAPSULE | Freq: Two times a day (BID) | ORAL | Status: DC

## 2016-06-25 MED ORDER — ARIPIPRAZOLE 10 MG PO TABS: 20.0000 mg | ORAL_TABLET | Freq: Every day | ORAL | Status: DC

## 2016-06-25 MED ORDER — GABAPENTIN 400 MG PO CAPS
400.0000 mg | ORAL_CAPSULE | Freq: Three times a day (TID) | ORAL | Status: DC
  Administered 2016-06-25: 400 mg via ORAL
  Filled 2016-06-25: qty 1

## 2016-06-25 MED ORDER — VENLAFAXINE HCL ER 75 MG PO CP24: 150.0000 mg | ORAL_CAPSULE | Freq: Every day | ORAL | Status: DC

## 2016-06-25 MED ORDER — GABAPENTIN 300 MG PO CAPS: 300.0000 mg | ORAL_CAPSULE | Freq: Three times a day (TID) | ORAL | Status: DC

## 2016-06-25 MED ORDER — ARIPIPRAZOLE 20 MG PO TABS: 20.0000 mg | ORAL_TABLET | Freq: Every day | ORAL | 0 refills | Status: DC

## 2016-06-25 MED ORDER — BENZTROPINE MESYLATE 1 MG PO TABS: 1.0000 mg | ORAL_TABLET | Freq: Two times a day (BID) | ORAL | Status: DC

## 2016-06-25 MED ORDER — GABAPENTIN 400 MG PO CAPS: 400.0000 mg | ORAL_CAPSULE | Freq: Three times a day (TID) | ORAL | 0 refills | Status: DC

## 2016-06-25 NOTE — ED Notes (Signed)
Patient sitting on side of bed rocking back and forth.

## 2016-06-25 NOTE — [facility] Suicide Risk Assessment (Cosign Needed)
Ssuicide Risk Assessment  Discharge Assessment   [facility] Discharge Suicide Risk Assessment   Principal Problem: Schizoaffective disorder [facility]) Discharge Diagnoses:  Patient Active Problem List   Diagnosis Date Noted  . Delirium [R41.0] 09/05/2014  . Acute encephalopathy [G93.40] 09/05/2014  . Chronic pain syndrome [G89.4] 09/05/2014  . Diarrhea [R19.7] 02/14/2011  . Schizoaffective disorder ([place]) [F25.9] 01/20/2011  . Seizure disorder ([place]) [J00.938] 09/02/2010  . Fracture, humerus, head [S42.293A] 11/05/2009  . CIGARETTE SMOKER [F17.200] 09/05/2007  . CHRONIC AIRWAY OBSTRUCTION NEC [J44.9] 07/11/2007  . Hypothyroidism [E03.9] 06/08/2006  . Hyperlipemia [E78.5] 06/08/2006    Total Time spent with patient: 30 minutes  Musculoskeletal: Strength & Muscle Tone: within normal limits Gait & Station: normal, With walker Patient leans: N/A  Psychiatric Specialty Exam: Physical Exam  Nursing note and vitals reviewed.   Review of Systems  Psychiatric/Behavioral: Positive for depression. Negative for hallucinations, substance abuse and suicidal ideas. The patient is nervous/anxious. The patient does not have insomnia.   All other systems reviewed and are negative.   Blood pressure (!) 97/54, pulse 76, temperature 98.7 F (37.1 C), temperature source Oral, resp. rate 20, SpO2 92 %.There is no height or weight on file to calculate BMI.  General Appearance: Casual  Eye Contact:  Good  Speech:  Clear and Coherent and Normal Rate  Volume:  Normal  Mood:  Appropriate   Affect:  Appropriate  Thought Process:  Coherent and Goal Directed  Orientation:  Full (Time, Place, and Person)  Thought Content:  Logical  Suicidal Thoughts:  No  Homicidal Thoughts:  No  Memory:  Immediate;   Good Recent;   Good Remote;   Good  Judgement:  Intact  Insight:  Fair  Psychomotor Activity:  Normal  Concentration:  Concentration: Good and Attention Span: Good  Recall:  Good  Fund of Knowledge:   Fair  Language:  Good  Akathisia:  No  Handed:  Right  AIMS (if indicated):     Assets:  Communication Skills Desire for Improvement Housing  ADL's:  Intact  Cognition:  WNL  Sleep:        Mental Status Per Nursing Assessment::   On Admission:   Psychosis  Demographic Factors:  NA  Loss Factors: NA  Historical Factors: Impulsivity  Risk Reduction Factors:   Positive social support and Positive therapeutic relationship  Continued Clinical Symptoms:  Previous Psychiatric Diagnoses and Treatments  Cognitive Features That Contribute To Risk:  None    Suicide Risk:  Minimal: No identifiable suicidal ideation.  Patients presenting with no risk factors but with morbid ruminations; may be classified as minimal risk based on the severity of the depressive symptoms    Plan Of Care/Follow-up recommendations:  Activity:  As tolerated Diet:  Heart Health Diet  Rankin, Shuvon, NP 06/25/2016, 3:33 PM

## 2016-06-25 NOTE — ED Notes (Signed)
Patient appears to be talking to someone who is not there.  When asked who she is talking to she says,"myself."  Denies any suicidal or homicidal ideation.

## 2016-06-25 NOTE — Progress Notes (Signed)
CSW spoke with patient's caregiver Drinda Butts [phone]). CSW inquired about transportation for patient, patient's caregiver reports that she sees patient weekly and is unable to provide transportation for patient. CSW inquired about any local family/friends, patient's caregiver reports that patient has a sister but that the sister doesn't drive.  CSW spoke with patient at bedside regarding transportation. Patient reported that she was brought here via EMS and needed transportation home. Patient reports that she does not have her purse and cannot pay for taxi. Patient reports that she feels safe returning home.   CSW contacted CSW Surveyor, quantity Edwyna Ready) and debriefed case. CSW Surveyor, quantity approved taxi voucher.    CSW will coordinate transportation for patient when patient is ready for discharge.

## 2016-06-25 NOTE — Consult Note (Signed)
[place] Psychiat[facility] Consult   Reason for Consult:  bizarre behavior Referring Physician:  EDP Patient Identification: Dominique Acosta MRN:  [id number] Principal Diagnosis: Schizoaffective disorder [facility]) Diagnosis:   Patient Active Problem List   Diagnosis Date Noted  . Delirium [R41.0] 09/05/2014  . Acute encephalopathy [G93.40] 09/05/2014  . Chronic pain syndrome [G89.4] 09/05/2014  . Diarrhea [R19.7] 02/14/2011  . Schizoaffective disorder ([place]) [F25.9] 01/20/2011  . Seizure disorder ([place]) [J17.915] 09/02/2010  . Fracture, humerus, head [S42.293A] 11/05/2009  . CIGARETTE SMOKER [F17.200] 09/05/2007  . CHRONIC AIRWAY OBSTRUCTION NEC [J44.9] 07/11/2007  . Hypothyroidism [E03.9] 06/08/2006  . Hyperlipemia [E78.5] 06/08/2006    Total Time spent with patient: 30 minutes  Subjective:   Dominique Acosta is a 66 y.o. female present to [facility] with complaints of psychotic behavior  HPI:  [name]  66 y.o. female patient seen by Dr. Darleene Cleaver and this provider.  Chart reviewed 06/25/16.   On evaluation:  Deisi Salonga reports that she has recently had changes to her medication; changing PO Abilify to IM.  States that she was given the IM Abilify injection "It doesn't work like that pills. I don't like how it makes me feel; also so states that her Cogentin was stopped .  At this time patient is alert and oriented x 4; does not appear to be responding to external or internal stimuli.  Patient states that she is sleeping, denies mood swings but does states that she is having some depression and anxiety that is tolerable (stable).     Past Psychiatric History: Bipolar Disorder  Risk to Self: Suicidal Ideation: denies Suicidal Intent: denies Is patient at risk for suicide?:  denies Suicidal Plan?:  denies Access to Means:  denies What has been your use of drugs/alcohol within the last 12 months?:  (unk) How many times?:  (unk) Other Self Harm Risks:  (unk) Triggers for Past Attempts:  Unknown Intentional Self Injurious Behavior:  (unk) Risk to Others: Homicidal Ideation:  (Unable to confirm or deny) Thoughts of Harm to Others:  (unk) Current Homicidal Intent:  (unk) Current Homicidal Plan:  (unk) Access to Homicidal Means:  (unk) Identified Victim:  (unk) History of harm to others?:  (unk) Assessment of Violence:  (unk) Violent Behavior Description:  (patient is calm and cooperative) Does patient have access to weapons?:  (unk) Criminal Charges Pending?:  (unk) Does patient have a court date:  (unk) Prior Inpatient Therapy: Prior Inpatient Therapy:  (unk) Prior Therapy Dates:  (unmk) Prior Therapy Facilty/Provider(s):  (unk) Reason for Treatment:  (unk) Prior Outpatient Therapy: Prior Outpatient Therapy:  (unk) Prior Therapy Dates:  (unk) Prior Therapy Facilty/Provider(s):  (unk) Reason for Treatment:  (unk) Does patient have an ACCT team?: Unknown Does patient have Intensive In-House Services?  : Unknown Does patient have [facility] services? : Unknown Does patient have P4CC services?: Unknown  Past Medical History:  Past Medical History:  Diagnosis Date  . Arm pain   . Chronic pain   . COPD (chronic obstructive pulmonary disease) ([place])   . Coronary artery disease   . Depression   . Dystonia   . Epilepsy ([place])   . High cholesterol   . Humerus fracture   . Hypothyroid   . Leukemia ([place])   . Myocardial infarct   . Schizophrenia ([place])   . Seizure [facility])     Past Surgical History:  Procedure Laterality Date  . EXTERNAL EAR SURGERY    . HUMERUS FRACTURE SURGERY    . SHOULDER SURGERY  Family History: History reviewed. No pertinent family history. Family Psychiatric  History: Denies Social History:  History  Alcohol Use No     History  Drug Use No    Social History   Social History  . Marital status: Single    Spouse name: N/A  . Number of children: N/A  . Years of education: N/A   Social History Main Topics  . Smoking status: Current  Every Day Smoker    Packs/day: 2.00    Years: 41.00    Types: Cigarettes  . Smokeless tobacco: None  . Alcohol use No  . Drug use: No  . Sexual activity: No   Other Topics Concern  . None   Social History Narrative  . None   Additional Social History:    Allergies:   Allergies  Allergen Reactions  . Amoxicillin Anaphylaxis  . Penicillins Anaphylaxis    Has patient had a PCN reaction causing immediate rash, facial/tongue/throat swelling, SOB or lightheadedness with hypotension: yes Has patient had a PCN reaction causing severe rash involving mucus membranes or skin necrosis: no Has patient had a PCN reaction that required hospitalization: yes Has patient had a PCN reaction occurring within the last 10 years: no If all of the above answers are "NO", then may proceed with Cephalosporin use.   Marland Kitchen Phenytoin Other (See Comments)    Other Reaction: CNS Disorder  . Dilaudid [Hydromorphone Hcl] Other (See Comments)    Psychosis (per patient)  . Haldol [Haloperidol]     Psychosis per caregiver  . Morphine And Related Other (See Comments)    psychosis    Labs:  Results for orders placed or performed during the hospital encounter of 06/24/16 (from the past 48 hour(s))  CBC with Differential     Status: Abnormal   Collection Time: 06/24/16  3:32 PM  Result Value Ref Range   WBC 14.7 (H) 4.0 - 10.5 K/uL   RBC 4.96 3.87 - 5.11 MIL/uL   Hemoglobin 14.8 12.0 - 15.0 g/dL   HCT 45.7 36.0 - 46.0 %   MCV 92.1 78.0 - 100.0 fL   MCH 29.8 26.0 - 34.0 pg   MCHC 32.4 30.0 - 36.0 g/dL   RDW 13.9 11.5 - 15.5 %   Platelets 375 150 - 400 K/uL   Neutrophils Relative % 75 %   Neutro Abs 10.9 (H) 1.7 - 7.7 K/uL   Lymphocytes Relative 18 %   Lymphs Abs 2.7 0.7 - 4.0 K/uL   Monocytes Relative 6 %   Monocytes Absolute 0.9 0.1 - 1.0 K/uL   Eosinophils Relative 1 %   Eosinophils Absolute 0.2 0.0 - 0.7 K/uL   Basophils Relative 0 %   Basophils Absolute 0.1 0.0 - 0.1 K/uL  Comprehensive  metabolic panel     Status: Abnormal   Collection Time: 06/24/16  3:32 PM  Result Value Ref Range   Sodium 137 135 - 145 mmol/L   Potassium 4.3 3.5 - 5.1 mmol/L   Chloride 102 101 - 111 mmol/L   CO2 25 22 - 32 mmol/L   Glucose, Bld 105 (H) 65 - 99 mg/dL   BUN 18 6 - 20 mg/dL   Creatinine, Ser 0.98 0.44 - 1.00 mg/dL   Calcium 8.9 8.9 - 10.3 mg/dL   Total Protein 8.2 (H) 6.5 - 8.1 g/dL   Albumin 4.2 3.5 - 5.0 g/dL   AST 36 15 - 41 U/L   ALT 20 14 - 54 U/L   Alkaline Phosphatase 82  38 - 126 U/L   Total Bilirubin 1.5 (H) 0.3 - 1.2 mg/dL   GFR calc non Af Amer 58 (L) >60 mL/min   GFR calc Af Amer >60 >60 mL/min    Comment: (NOTE) The eGFR has been calculated using the CKD EPI equation. This calculation has not been validated in all clinical situations. eGFR's persistently <60 mL/min signify possible Chronic Kidney Disease.    Anion gap 10 5 - 15  Acetaminophen level     Status: Abnormal   Collection Time: 06/24/16  3:32 PM  Result Value Ref Range   Acetaminophen (Tylenol), Serum <10 (L) 10 - 30 ug/mL    Comment:        THERAPEUTIC CONCENTRATIONS VARY SIGNIFICANTLY. A RANGE OF 10-30 ug/mL MAY BE AN EFFECTIVE CONCENTRATION FOR MANY PATIENTS. HOWEVER, SOME ARE BEST TREATED AT CONCENTRATIONS OUTSIDE THIS RANGE. ACETAMINOPHEN CONCENTRATIONS >150 ug/mL AT 4 HOURS AFTER INGESTION AND >50 ug/mL AT 12 HOURS AFTER INGESTION ARE OFTEN ASSOCIATED WITH TOXIC REACTIONS.   Salicylate level     Status: None   Collection Time: 06/24/16  3:32 PM  Result Value Ref Range   Salicylate Lvl <9.1 2.8 - 30.0 mg/dL  Urinalysis, Routine w reflex microscopic     Status: Abnormal   Collection Time: 06/25/16 12:06 AM  Result Value Ref Range   Color, Urine YELLOW YELLOW   APPearance HAZY (A) CLEAR   Specific Gravity, Urine 1.027 1.005 - 1.030   pH 5.0 5.0 - 8.0   Glucose, UA NEGATIVE NEGATIVE mg/dL   Hgb urine dipstick NEGATIVE NEGATIVE   Bilirubin Urine NEGATIVE NEGATIVE   Ketones, ur 20 (A)  NEGATIVE mg/dL   Protein, ur NEGATIVE NEGATIVE mg/dL   Nitrite NEGATIVE NEGATIVE   Leukocytes, UA LARGE (A) NEGATIVE   RBC / HPF 6-30 0 - 5 RBC/hpf   WBC, UA TOO NUMEROUS TO COUNT 0 - 5 WBC/hpf   Bacteria, UA RARE (A) NONE SEEN   Squamous Epithelial / LPF 6-30 (A) NONE SEEN   Mucous PRESENT   Rapid urine drug screen (hospital performed)     Status: Abnormal   Collection Time: 06/25/16 12:06 AM  Result Value Ref Range   Opiates POSITIVE (A) NONE DETECTED   Cocaine NONE DETECTED NONE DETECTED   Benzodiazepines POSITIVE (A) NONE DETECTED   Amphetamines NONE DETECTED NONE DETECTED   Tetrahydrocannabinol NONE DETECTED NONE DETECTED   Barbiturates NONE DETECTED NONE DETECTED    Comment:        DRUG SCREEN FOR MEDICAL PURPOSES ONLY.  IF CONFIRMATION IS NEEDED FOR ANY PURPOSE, NOTIFY LAB WITHIN 5 DAYS.        LOWEST DETECTABLE LIMITS FOR URINE DRUG SCREEN Drug Class       Cutoff (ng/mL) Amphetamine      1000 Barbiturate      200 Benzodiazepine   660 Tricyclics       600 Opiates          300 Cocaine          300 THC              50     Current Facility-Administered Medications  Medication Dose Route Frequency Provider Last Rate Last Dose  . albuterol (PROVENTIL) (2.5 MG/3ML) 0.083% nebulizer solution 2.5 mg  2.5 mg Nebulization Q6H PRN Deno Etienne, DO      . amantadine (SYMMETREL) capsule 100 mg  100 mg Oral BID Corena Pilgrim, MD      . Derrill Memo ON 06/26/2016] ARIPiprazole (ABILIFY) tablet  20 mg  20 mg Oral Daily Atwell Mcdanel, MD      . aspirin EC tablet 81 mg  81 mg Oral Daily Deno Etienne, DO   81 mg at 06/25/16 0956  . baclofen (LIORESAL) tablet 10 mg  10 mg Oral TID Deno Etienne, DO   10 mg at 06/25/16 0954  . bisacodyl (DULCOLAX) EC tablet 5 mg  5 mg Oral Daily PRN Deno Etienne, DO      . darifenacin (ENABLEX) 24 hr tablet 7.5 mg  7.5 mg Oral Daily Deno Etienne, DO   7.5 mg at 06/25/16 0956  . docusate sodium (COLACE) capsule 100 mg  100 mg Oral Daily PRN Deno Etienne, DO      .  gabapentin (NEURONTIN) capsule 400 mg  400 mg Oral TID WC Jarry Manon, MD      . levothyroxine (SYNTHROID, LEVOTHROID) tablet 88 mcg  88 mcg Oral QAC breakfast Deno Etienne, DO   88 mcg at 06/25/16 0815  . propranolol (INDERAL) tablet 10 mg  10 mg Oral BID Deno Etienne, DO   10 mg at 06/25/16 0956  . simvastatin (ZOCOR) tablet 40 mg  40 mg Oral Daily Deno Etienne, DO   40 mg at 06/25/16 0957  . [START ON 06/26/2016] venlafaxine XR (EFFEXOR-XR) 24 hr capsule 150 mg  150 mg Oral Q breakfast Corena Pilgrim, MD       Current Outpatient Prescriptions  Medication Sig Dispense Refill  . acetaminophen-codeine (TYLENOL #3) 300-30 MG tablet Take 1 tablet by mouth 4 (four) times daily.     Marland Kitchen acyclovir (ZOVIRAX) 200 MG capsule Take 200 mg by mouth 2 (two) times daily.    Marland Kitchen amantadine (SYMMETREL) 100 MG capsule Take 100 mg by mouth daily.    . ARIPiprazole (ABILIFY) 30 MG tablet Take 30 mg by mouth daily.     . baclofen (LIORESAL) 10 MG tablet Take 10 mg by mouth 3 (three) times daily.     . benztropine (COGENTIN) 2 MG tablet Take 1 tablet by mouth 3 (three) times daily.    Marland Kitchen gabapentin (NEURONTIN) 800 MG tablet Take 800-1,600 mg by mouth 4 (four) times daily. 1 tab three times daily and 2 tablets at beditme. 30 tablet 2  . levothyroxine (SYNTHROID, LEVOTHROID) 88 MCG tablet Take 88 mcg by mouth daily.     Marland Kitchen LORazepam (ATIVAN) 1 MG tablet Take 1 tablet by mouth 2 (two) times daily.     . Oxycodone HCl 10 MG TABS Take 1 tablet by mouth 4 (four) times daily.     . propranolol (INDERAL) 10 MG tablet Take 10 mg by mouth 2 (two) times daily.    . simvastatin (ZOCOR) 40 MG tablet Take 40 mg by mouth daily.    . solifenacin (VESICARE) 10 MG tablet Take 10 mg by mouth daily.    . temazepam (RESTORIL) 30 MG capsule Take 30 mg by mouth at bedtime.    Marland Kitchen venlafaxine (EFFEXOR-XR) 150 MG 24 hr capsule Take 150 mg by mouth 2 (two) times daily. Reported on 10/27/2015    . albuterol (PROVENTIL,VENTOLIN) 90 MCG/ACT inhaler Inhale  2 puffs into the lungs as directed. 1-2 puffs up to three times a day prn (Patient taking differently: Inhale 1-2 puffs into the lungs 3 (three) times daily as needed for wheezing or shortness of breath. 1-2 puffs up to three times a day prn) 17 g 5  . aspirin 81 MG tablet Take 81 mg by mouth daily.     Marland Kitchen  bisacodyl (DULCOLAX) 5 MG EC tablet Take 5 mg by mouth daily as needed for moderate constipation.    . diphenhydrAMINE (BENADRYL) 25 mg capsule Take 25 mg by mouth every 6 (six) hours as needed for itching, allergies or sleep.    Marland Kitchen docusate sodium (COLACE) 100 MG capsule Take 100 mg by mouth daily as needed for mild constipation or moderate constipation.    . simvastatin (ZOCOR) 80 MG tablet Take 0.5 tablets (40 mg total) by mouth at bedtime. (Patient not taking: Reported on 10/27/2015) 30 tablet 11    Musculoskeletal: Strength & Muscle Tone: within normal limits Gait & Station: normal, With walker Patient leans: N/A  Psychiatric Specialty Exam: Physical Exam  Nursing note and vitals reviewed.   Review of Systems  Psychiatric/Behavioral: Positive for depression. Negative for hallucinations, substance abuse and suicidal ideas. The patient is nervous/anxious. The patient does not have insomnia.   All other systems reviewed and are negative.   Blood pressure (!) 97/54, pulse 76, temperature 98.7 F (37.1 C), temperature source Oral, resp. rate 20, SpO2 92 %.There is no height or weight on file to calculate BMI.  General Appearance: Casual  Eye Contact:  Good  Speech:  Clear and Coherent and Normal Rate  Volume:  Normal  Mood:  Appropriate   Affect:  Appropriate  Thought Process:  Coherent and Goal Directed  Orientation:  Full (Time, Place, and Person)  Thought Content:  Logical  Suicidal Thoughts:  No  Homicidal Thoughts:  No  Memory:  Immediate;   Good Recent;   Good Remote;   Good  Judgement:  Intact  Insight:  Fair  Psychomotor Activity:  Normal  Concentration:  Concentration:  Good and Attention Span: Good  Recall:  Good  Fund of Knowledge:  Fair  Language:  Good  Akathisia:  No  Handed:  Right  AIMS (if indicated):     Assets:  Communication Skills Desire for Improvement Housing  ADL's:  Intact  Cognition:  WNL  Sleep:        Treatment Plan Summary: Plan Discharge home  Disposition: No evidence of imminent risk to self or others at present.   Patient does not meet criteria for psychiatric inpatient admission.  Rankin, Delphia Grates, NP 06/25/2016 1:47 PM  Patient seen face-to-face for psychiatric evaluation, chart reviewed and case discussed with the physician extender and developed treatment plan. Reviewed the information documented and agree with the treatment plan. Corena Pilgrim, MD

## 2016-06-25 NOTE — Progress Notes (Signed)
CSW coordinated transportation for patient. CSW provided patient's RN with taxi voucher.   Abundio Miu, [facility]  Clinical Social Worker [phone]

## 2016-07-01 ENCOUNTER — Other Ambulatory Visit: Payer: Self-pay | Admitting: Internal Medicine

## 2016-07-01 DIAGNOSIS — E2839 Other primary ovarian failure: Secondary | ICD-10-CM

## 2016-07-07 ENCOUNTER — Emergency Department (HOSPITAL_COMMUNITY)
Admission: EM | Admit: 2016-07-07 | Discharge: 2016-07-09 | Disposition: A | Discharge status: Home / Self Care | Payer: Medicare Other | Attending: Emergency Medicine | Admitting: Emergency Medicine

## 2016-07-07 ENCOUNTER — Encounter (HOSPITAL_COMMUNITY): Payer: Self-pay | Admitting: Emergency Medicine

## 2016-07-07 DIAGNOSIS — Z79899 Other long term (current) drug therapy: Secondary | ICD-10-CM | POA: Insufficient documentation

## 2016-07-07 DIAGNOSIS — N3000 Acute cystitis without hematuria: Secondary | ICD-10-CM | POA: Insufficient documentation

## 2016-07-07 DIAGNOSIS — E039 Hypothyroidism, unspecified: Secondary | ICD-10-CM | POA: Diagnosis not present

## 2016-07-07 DIAGNOSIS — F250 Schizoaffective disorder, bipolar type: Secondary | ICD-10-CM | POA: Diagnosis present

## 2016-07-07 DIAGNOSIS — R441 Visual hallucinations: Secondary | ICD-10-CM

## 2016-07-07 DIAGNOSIS — I2510 Atherosclerotic heart disease of native coronary artery without angina pectoris: Secondary | ICD-10-CM | POA: Insufficient documentation

## 2016-07-07 DIAGNOSIS — I252 Old myocardial infarction: Secondary | ICD-10-CM | POA: Insufficient documentation

## 2016-07-07 DIAGNOSIS — Z7982 Long term (current) use of aspirin: Secondary | ICD-10-CM | POA: Diagnosis not present

## 2016-07-07 DIAGNOSIS — F251 Schizoaffective disorder, depressive type: Secondary | ICD-10-CM | POA: Diagnosis not present

## 2016-07-07 DIAGNOSIS — F17210 Nicotine dependence, cigarettes, uncomplicated: Secondary | ICD-10-CM | POA: Diagnosis not present

## 2016-07-07 DIAGNOSIS — J449 Chronic obstructive pulmonary disease, unspecified: Secondary | ICD-10-CM | POA: Insufficient documentation

## 2016-07-07 LAB — CBC
HCT: 39.1 % (ref 36.0–46.0)
HEMOGLOBIN: 12.5 g/dL (ref 12.0–15.0)
MCH: 30.5 pg (ref 26.0–34.0)
MCHC: 32 g/dL (ref 30.0–36.0)
MCV: 95.4 fL (ref 78.0–100.0)
PLATELETS: 332 10*3/uL (ref 150–400)
RBC: 4.1 MIL/uL (ref 3.87–5.11)
RDW: 14.4 % (ref 11.5–15.5)
WBC: 11.3 10*3/uL — ABNORMAL HIGH (ref 4.0–10.5)

## 2016-07-07 LAB — COMPREHENSIVE METABOLIC PANEL
ALBUMIN: 3.9 g/dL (ref 3.5–5.0)
ALK PHOS: 75 U/L (ref 38–126)
ALT: 24 U/L (ref 14–54)
AST: 25 U/L (ref 15–41)
Anion gap: 6 (ref 5–15)
BILIRUBIN TOTAL: 0.4 mg/dL (ref 0.3–1.2)
BUN: 12 mg/dL (ref 6–20)
CALCIUM: 8.6 mg/dL — AB (ref 8.9–10.3)
CO2: 27 mmol/L (ref 22–32)
CREATININE: 0.74 mg/dL (ref 0.44–1.00)
Chloride: 110 mmol/L (ref 101–111)
GFR calc Af Amer: >60 mL/min (ref >60)
Glucose, Bld: 94 mg/dL (ref 65–99)
Potassium: 3.6 mmol/L (ref 3.5–5.1)
SODIUM: 143 mmol/L (ref 135–145)
Total Protein: 6.9 g/dL (ref 6.5–8.1)

## 2016-07-07 LAB — ETHANOL: Alcohol, Ethyl (B): <5 mg/dL (ref <5)

## 2016-07-07 MED ORDER — BENZTROPINE MESYLATE 1 MG PO TABS
2.0000 mg | ORAL_TABLET | Freq: Three times a day (TID) | ORAL | Status: DC
  Administered 2016-07-07 – 2016-07-08 (×3): 2 mg via ORAL
  Filled 2016-07-07 (×3): qty 2

## 2016-07-07 MED ORDER — ARIPIPRAZOLE 10 MG PO TABS
30.0000 mg | ORAL_TABLET | Freq: Every day | ORAL | Status: DC
  Administered 2016-07-07: 30 mg via ORAL
  Filled 2016-07-07: qty 3

## 2016-07-07 MED ORDER — GABAPENTIN 400 MG PO CAPS
400.0000 mg | ORAL_CAPSULE | Freq: Three times a day (TID) | ORAL | Status: DC
  Administered 2016-07-07 – 2016-07-09 (×6): 400 mg via ORAL
  Filled 2016-07-07 (×6): qty 1

## 2016-07-07 MED ORDER — GABAPENTIN 800 MG PO TABS: 800.0000 mg | ORAL_TABLET | Freq: Four times a day (QID) | ORAL | Status: DC

## 2016-07-07 MED ORDER — ALBUTEROL 90 MCG/ACT IN AERS
2.0000 | INHALATION_SPRAY | RESPIRATORY_TRACT | Status: DC
Start: 2016-07-07 — End: 2016-07-07

## 2016-07-07 MED ORDER — ACETAMINOPHEN 325 MG PO TABS
650.0000 mg | ORAL_TABLET | ORAL | Status: DC | PRN
  Administered 2016-07-08 – 2016-07-09 (×3): 650 mg via ORAL
  Filled 2016-07-07 (×3): qty 2

## 2016-07-07 MED ORDER — BISACODYL 5 MG PO TBEC
5.0000 mg | DELAYED_RELEASE_TABLET | Freq: Every day | ORAL | Status: DC | PRN
  Filled 2016-07-07: qty 1

## 2016-07-07 MED ORDER — BACLOFEN 10 MG PO TABS
10.0000 mg | ORAL_TABLET | Freq: Three times a day (TID) | ORAL | Status: DC
  Administered 2016-07-07 – 2016-07-09 (×6): 10 mg via ORAL
  Filled 2016-07-07 (×6): qty 1

## 2016-07-07 MED ORDER — AMANTADINE HCL 100 MG PO CAPS
100.0000 mg | ORAL_CAPSULE | Freq: Every day | ORAL | Status: DC
  Administered 2016-07-07 – 2016-07-08 (×2): 100 mg via ORAL
  Filled 2016-07-07 (×2): qty 1

## 2016-07-07 MED ORDER — ONDANSETRON HCL 4 MG PO TABS: 4.0000 mg | ORAL_TABLET | Freq: Three times a day (TID) | ORAL | Status: DC | PRN

## 2016-07-07 MED ORDER — ALBUTEROL SULFATE HFA 108 (90 BASE) MCG/ACT IN AERS: 1.0000 | INHALATION_SPRAY | Freq: Three times a day (TID) | RESPIRATORY_TRACT | Status: DC | PRN

## 2016-07-07 MED ORDER — VENLAFAXINE HCL ER 75 MG PO CP24
150.0000 mg | ORAL_CAPSULE | Freq: Two times a day (BID) | ORAL | Status: DC
  Administered 2016-07-08: 150 mg via ORAL
  Filled 2016-07-07: qty 2

## 2016-07-07 MED ORDER — ALUM & MAG HYDROXIDE-SIMETH 200-200-20 MG/5ML PO SUSP: 30.0000 mL | ORAL | Status: DC | PRN

## 2016-07-07 MED ORDER — ZOLPIDEM TARTRATE 5 MG PO TABS: 5.0000 mg | ORAL_TABLET | Freq: Every evening | ORAL | Status: DC | PRN

## 2016-07-07 MED ORDER — PROPRANOLOL HCL 10 MG PO TABS
10.0000 mg | ORAL_TABLET | Freq: Two times a day (BID) | ORAL | Status: DC
  Administered 2016-07-08 – 2016-07-09 (×3): 10 mg via ORAL
  Filled 2016-07-07 (×5): qty 1

## 2016-07-07 MED ORDER — ASPIRIN 81 MG PO CHEW
81.0000 mg | CHEWABLE_TABLET | Freq: Every day | ORAL | Status: DC
  Administered 2016-07-07 – 2016-07-09 (×3): 81 mg via ORAL
  Filled 2016-07-07 (×3): qty 1

## 2016-07-07 MED ORDER — TEMAZEPAM 15 MG PO CAPS
30.0000 mg | ORAL_CAPSULE | Freq: Every day | ORAL | Status: DC
  Administered 2016-07-08: 30 mg via ORAL
  Filled 2016-07-07: qty 2

## 2016-07-07 MED ORDER — ACYCLOVIR 200 MG PO CAPS
200.0000 mg | ORAL_CAPSULE | Freq: Two times a day (BID) | ORAL | Status: DC
  Administered 2016-07-08 – 2016-07-09 (×4): 200 mg via ORAL
  Filled 2016-07-07 (×5): qty 1

## 2016-07-07 MED ORDER — DARIFENACIN HYDROBROMIDE ER 7.5 MG PO TB24
7.5000 mg | ORAL_TABLET | Freq: Every day | ORAL | Status: DC
  Administered 2016-07-08 – 2016-07-09 (×2): 7.5 mg via ORAL
  Filled 2016-07-07 (×3): qty 1

## 2016-07-07 MED ORDER — ATORVASTATIN CALCIUM 40 MG PO TABS
40.0000 mg | ORAL_TABLET | Freq: Every day | ORAL | Status: DC
  Administered 2016-07-08: 40 mg via ORAL
  Filled 2016-07-07 (×3): qty 1

## 2016-07-07 MED ORDER — ARIPIPRAZOLE 10 MG PO TABS
20.0000 mg | ORAL_TABLET | Freq: Every day | ORAL | Status: DC
  Administered 2016-07-08 – 2016-07-09 (×2): 20 mg via ORAL
  Filled 2016-07-07 (×2): qty 2

## 2016-07-07 MED ORDER — LEVOTHYROXINE SODIUM 88 MCG PO TABS
88.0000 ug | ORAL_TABLET | Freq: Every day | ORAL | Status: DC
  Administered 2016-07-08 – 2016-07-09 (×2): 88 ug via ORAL
  Filled 2016-07-07 (×3): qty 1

## 2016-07-07 MED ORDER — IBUPROFEN 200 MG PO TABS
600.0000 mg | ORAL_TABLET | Freq: Three times a day (TID) | ORAL | Status: DC | PRN
  Administered 2016-07-09: 600 mg via ORAL
  Filled 2016-07-07: qty 3

## 2016-07-07 MED ORDER — DARIFENACIN HYDROBROMIDE ER 15 MG PO TB24: 15.0000 mg | ORAL_TABLET | Freq: Every day | ORAL | Status: DC

## 2016-07-07 NOTE — ED Notes (Signed)
ED Provider at bedside. 

## 2016-07-07 NOTE — BH Assessment (Signed)
[place] Assessment Progress Note  Case was staffed with [place] DNP who recommended patient be re-evaluated in the a.m.

## 2016-07-07 NOTE — ED Provider Notes (Signed)
[facility] DEPT Provider Note   CSN: [id number] Arrival date & time: 07/07/16  1233     History   Chief Complaint Chief Complaint  Patient presents with  . Hallucinations  . IVC   Level V caveat: Psychiatric disturbance  HPI Dominique Acosta is a 66 y.o. female.  HPI Patient presents to the emergency department from a facility after involuntary commitment forms were filled out by the social worker.  Apparently she's been acting out and threatening multiple staff members and residents.  She has had physical alterations with multiple staff members.  Apparently she is noncompliant with her medications and they found pills thrown about her room.  The patient continues to speak to herself and looked incessantly around the room.  She follows only simple commands.  Involuntary commitment forms filled out by Education officer, museum and reviewed by myself.  These are available the patient's bedside currently.   Past Medical History:  Diagnosis Date  . Arm pain   . Chronic pain   . COPD (chronic obstructive pulmonary disease) ([place])   . Coronary artery disease   . Depression   . Dystonia   . Epilepsy ([place])   . High cholesterol   . Humerus fracture   . Hypothyroid   . Leukemia ([place])   . Myocardial infarct   . Schizophrenia ([place])   . Seizure [facility])     Patient Active Problem List   Diagnosis Date Noted  . Delirium 09/05/2014  . Acute encephalopathy 09/05/2014  . Chronic pain syndrome 09/05/2014  . Diarrhea 02/14/2011  . Schizoaffective disorder ([place]) 01/20/2011  . Seizure disorder ([place]) 09/02/2010  . Fracture, humerus, head 11/05/2009  . CIGARETTE SMOKER 09/05/2007  . CHRONIC AIRWAY OBSTRUCTION NEC 07/11/2007  . Hypothyroidism 06/08/2006  . Hyperlipemia 06/08/2006    Past Surgical History:  Procedure Laterality Date  . EXTERNAL EAR SURGERY    . HUMERUS FRACTURE SURGERY    . SHOULDER SURGERY      OB History    No data available       Home Medications    Prior to  Admission medications   Medication Sig Start Date End Date Taking? Authorizing Provider  acetaminophen-codeine (TYLENOL #3) 300-30 MG tablet Take 1 tablet by mouth 4 (four) times daily.     Historical Provider, MD  acyclovir (ZOVIRAX) 200 MG capsule Take 200 mg by mouth 2 (two) times daily.    Historical Provider, MD  albuterol (PROVENTIL,VENTOLIN) 90 MCG/ACT inhaler Inhale 2 puffs into the lungs as directed. 1-2 puffs up to three times a day prn Patient taking differently: Inhale 1-2 puffs into the lungs 3 (three) times daily as needed for wheezing or shortness of breath. 1-2 puffs up to three times a day prn 11/22/10   Blane Ohara McDiarmid, MD  amantadine (SYMMETREL) 100 MG capsule Take 100 mg by mouth daily.    Historical Provider, MD  ARIPiprazole (ABILIFY) 20 MG tablet Take 1 tablet (20 mg total) by mouth daily. 06/26/16   Shuvon B Rankin, NP  ARIPiprazole (ABILIFY) 30 MG tablet Take 30 mg by mouth daily.  08/11/15   Historical Provider, MD  aspirin 81 MG tablet Take 81 mg by mouth daily.     Historical Provider, MD  baclofen (LIORESAL) 10 MG tablet Take 10 mg by mouth 3 (three) times daily.  09/13/15   Historical Provider, MD  benztropine (COGENTIN) 2 MG tablet Take 1 tablet by mouth 3 (three) times daily. 01/06/14   Historical Provider, MD  bisacodyl (DULCOLAX) 5 MG  EC tablet Take 5 mg by mouth daily as needed for moderate constipation.    Historical Provider, MD  darifenacin (ENABLEX) 7.5 MG 24 hr tablet Take 1 tablet (7.5 mg total) by mouth daily. 06/26/16   Shuvon B Rankin, NP  diphenhydrAMINE (BENADRYL) 25 mg capsule Take 25 mg by mouth every 6 (six) hours as needed for itching, allergies or sleep.    Historical Provider, MD  docusate sodium (COLACE) 100 MG capsule Take 100 mg by mouth daily as needed for mild constipation or moderate constipation.    Historical Provider, MD  gabapentin (NEURONTIN) 400 MG capsule Take 1 capsule (400 mg total) by mouth 3 (three) times daily with meals. 06/25/16   Shuvon  B Rankin, NP  gabapentin (NEURONTIN) 800 MG tablet Take 800-1,600 mg by mouth 4 (four) times daily. 1 tab three times daily and 2 tablets at beditme. 10/12/10   Blane Ohara McDiarmid, MD  levothyroxine (SYNTHROID, LEVOTHROID) 88 MCG tablet Take 88 mcg by mouth daily.     Historical Provider, MD  LORazepam (ATIVAN) 1 MG tablet Take 1 tablet by mouth 2 (two) times daily.  03/12/14   Historical Provider, MD  Oxycodone HCl 10 MG TABS Take 1 tablet by mouth 4 (four) times daily.  03/12/14   Historical Provider, MD  propranolol (INDERAL) 10 MG tablet Take 10 mg by mouth 2 (two) times daily.    Historical Provider, MD  simvastatin (ZOCOR) 40 MG tablet Take 40 mg by mouth daily.    Historical Provider, MD  simvastatin (ZOCOR) 80 MG tablet Take 0.5 tablets (40 mg total) by mouth at bedtime. Patient not taking: Reported on 10/27/2015 02/16/11   Lyndee Hensen, MD  solifenacin (VESICARE) 10 MG tablet Take 10 mg by mouth daily.    Historical Provider, MD  temazepam (RESTORIL) 30 MG capsule Take 30 mg by mouth at bedtime.    Historical Provider, MD  venlafaxine (EFFEXOR-XR) 150 MG 24 hr capsule Take 150 mg by mouth 2 (two) times daily. Reported on 10/27/2015    Historical Provider, MD    Family History No family history on file.  Social History Social History  Substance Use Topics  . Smoking status: Current Every Day Smoker    Packs/day: 2.00    Years: 41.00    Types: Cigarettes  . Smokeless tobacco: Not on file  . Alcohol use No     Allergies   Amoxicillin; Penicillins; Phenytoin; Dilaudid [hydromorphone hcl]; Haldol [haloperidol]; and Morphine and related   Review of Systems Review of Systems  Unable to perform ROS: Psychiatric disorder     Physical Exam Updated Vital Signs BP 130/70   Pulse 78   Temp 99.1 F (37.3 C) (Oral)   Resp 16   SpO2 93%   Physical Exam  Constitutional: She appears well-developed and well-nourished. No distress.  HENT:  Head: Normocephalic and atraumatic.  Eyes:  EOM are normal.  Neck: Normal range of motion.  Cardiovascular: Normal rate, regular rhythm and normal heart sounds.   Pulmonary/Chest: Effort normal and breath sounds normal.  Abdominal: Soft. She exhibits no distension. There is no tenderness.  Musculoskeletal: Normal range of motion. She exhibits no edema or tenderness.  Moves all 4 extremities equally  Neurological: She is alert.  Follow simple commands.  Skin: Skin is warm and dry.  Psychiatric: Her affect is inappropriate. She is hyperactive. She is not slowed and not withdrawn. She is noncommunicative.  Nursing note and vitals reviewed.    ED Treatments / Results  Labs (all labs ordered are listed, but only abnormal results are displayed) Labs Reviewed  CBC  COMPREHENSIVE METABOLIC PANEL  URINALYSIS, ROUTINE W REFLEX MICROSCOPIC  ETHANOL  RAPID URINE DRUG SCREEN, HOSP PERFORMED    EKG  EKG Interpretation None       Radiology No results found.  Procedures Procedures (including critical care time)  Medications Ordered in ED Medications  ondansetron (ZOFRAN) tablet 4 mg (not administered)  zolpidem (AMBIEN) tablet 5 mg (not administered)  acetaminophen (TYLENOL) tablet 650 mg (not administered)  ibuprofen (ADVIL,MOTRIN) tablet 600 mg (not administered)  alum & mag hydroxide-simeth (MAALOX/MYLANTA) 200-200-20 MG/5ML suspension 30 mL (not administered)     Initial Impression / Assessment and Plan / ED Course  I have reviewed the triage vital signs and the nursing notes.  Pertinent labs & imaging results that were available during my care of the patient were reviewed by me and considered in my medical decision making (see chart for details).     Behavior health team asked to evaluate the patient.  She is under IVCs.  I will continue to follow up on her urine and laboratory studies.  This appears to be mental health disturbance.  Final Clinical Impressions(s) / ED Diagnoses   Final diagnoses:  None     New Prescriptions New Prescriptions   No medications on file     Jola Schmidt, MD 07/07/16 1323

## 2016-07-07 NOTE — ED Notes (Signed)
Bed: VV87 Expected date:  Expected time:  Means of arrival:  Comments: EMS, behavioral 66 yo

## 2016-07-07 NOTE — ED Triage Notes (Signed)
Per PTAR, patient from home, called out because social worker states patient has been "acting out and trashing her apartment." Patient "talking to herself" upon EMS arrival. Patient IVC by Education officer, museum. Ambulatory with EMS. A&Ox4.

## 2016-07-07 NOTE — BH Assessment (Signed)
Assessment Note  Dominique Acosta is an 66 y.o. female that presents this date under IVC. Per IVC: "Respondent has a long history of mental illness. Lately respondent has yelling and screaming in her home while alone. The respondent came out to the commons area and started beating on other resident's doors asking for her deceased mother. According to petitioner the respondent has been making threats to her health care worker slamming the door on her worker's foot. Respondent has pills scattered around on her floor and randomly picks them up and takes them". This writer attempts to assess patient unsuccessfully. Patient does not seem to be aware that writer is in the room. Patient seems to be responding to internal stimuli and is talking to herself incoherently. Patient is not oriented to time/place. Information for the purposes of assessment was obtained from IVC and admission notes. This Probation officer also unsuccessfully attempted to contact social worker Event organiser to obtain information although CSW could not be reached. Per notes: "Patient presents to the emergency department from a facility after involuntary commitment forms were filled out by the social worker. Apparently she's been acting out and threatening multiple staff members and residents. She has had physical alterations with multiple staff members.  Apparently she is noncompliant with her medications and they found pills thrown about her room. The patient continues to speak to herself and looked incessantly around the room". Case was staffed with Lilla Shook DNP who recommended patient be re-evaluated in the a.m.    Diagnosis: Schizophrenia (per records)   Past Medical History:  Past Medical History:  Diagnosis Date  . Arm pain   . Chronic pain   . COPD (chronic obstructive pulmonary disease) ([place])   . Coronary artery disease   . Depression   . Dystonia   . Epilepsy ([place])   . High cholesterol   . Humerus fracture   . Hypothyroid   . Leukemia  ([place])   . Myocardial infarct   . Schizophrenia ([place])   . Seizure [facility])     Past Surgical History:  Procedure Laterality Date  . EXTERNAL EAR SURGERY    . HUMERUS FRACTURE SURGERY    . SHOULDER SURGERY      Family History: No family history on file.  Social History:  reports that she has been smoking Cigarettes.  She has a 82.00 pack-year smoking history. She does not have any smokeless tobacco history on file. She reports that she does not drink alcohol or use drugs.  Additional Social History:  Alcohol / Drug Use Pain Medications: SEE MAR Prescriptions: SEE MAR Over the Counter: SEE MAR History of alcohol / drug use?: No history of alcohol / drug abuse Longest period of sobriety (when/how long):  (denies) Negative Consequences of Use:  (denies) Withdrawal Symptoms:  (denies)  CIWA: CIWA-Ar BP: 130/70 Pulse Rate: 78 COWS:    Allergies:  Allergies  Allergen Reactions  . Amoxicillin Anaphylaxis  . Penicillins Anaphylaxis    Has patient had a PCN reaction causing immediate rash, facial/tongue/throat swelling, SOB or lightheadedness with hypotension: yes Has patient had a PCN reaction causing severe rash involving mucus membranes or skin necrosis: no Has patient had a PCN reaction that required hospitalization: yes Has patient had a PCN reaction occurring within the last 10 years: no If all of the above answers are "NO", then may proceed with Cephalosporin use.   Marland Kitchen Phenytoin Other (See Comments)    Other Reaction: CNS Disorder  . Dilaudid [Hydromorphone Hcl] Other (See Comments)  Psychosis (per patient)  . Haldol [Haloperidol]     Psychosis per caregiver  . Morphine And Related Other (See Comments)    psychosis    Home Medications:  (Not in a hospital admission)  OB/GYN Status:  No LMP recorded. Patient is postmenopausal.  General Assessment Data Location of Assessment: [facility] TTS Assessment: In system Is this a Tele or Face-to-Face Assessment?:  Face-to-Face Is this an Initial Assessment or a Re-assessment for this encounter?: Initial Assessment Marital status: Single Maiden name:  (na) Is patient pregnant?: No Pregnancy Status: No Living Arrangements: Alone Can pt return to current living arrangement?:  (UTA) Admission Status: Involuntary Is patient capable of signing voluntary admission?: No Referral Source: Other (Education officer, museum) Insurance type: Herbalist (per notes)  Medical Screening Exam ([place]) Medical Exam completed: Yes  Crisis Care Plan Living Arrangements: Alone Legal Guardian:  (UTA) Name of Psychiatrist:  (UTA) Name of Therapist:  (UTA)  Education Status Is patient currently in school?:  (no) Current Grade:  (na) Highest grade of school patient has completed:  ([employer]) Name of school:  (UTA) Contact person:  (UTA)  Risk to self with the past 6 months Suicidal Ideation:  (UTA) Has patient been a risk to self within the past 6 months prior to admission? :  (UTA) Suicidal Intent:  (UTA) Has patient had any suicidal intent within the past 6 months prior to admission? :  (UTA) Is patient at risk for suicide?: No Suicidal Plan?:  (UTA) Has patient had any suicidal plan within the past 6 months prior to admission? :  (UTA) Access to Means:  (UTA) What has been your use of drugs/alcohol within the last 12 months?:  (UTA) Previous Attempts/Gestures:  (UTA) How many times?:  (UTA) Other Self Harm Risks:  (UTA) Triggers for Past Attempts:  (UTA) Intentional Self Injurious Behavior:  (UTA) Family Suicide History:  (UTA) Recent stressful life event(s):  (UTA) Persecutory voices/beliefs?:  Pincus Badder) Depression:  (UTA) Depression Symptoms:  (UTA) Substance abuse history and/or treatment for substance abuse?: No Suicide prevention information given to non-admitted patients: Not applicable (UTA)  Risk to Others within the past 6 months Homicidal Ideation:  (UTA) Does patient have any lifetime risk of  violence toward others beyond the six months prior to admission? :  (UTA) Thoughts of Harm to Others:  (UTA) Current Homicidal Intent:  (UTA) Current Homicidal Plan:  (UTA) Access to Homicidal Means:  (UTA) Identified Victim:  (UTA) History of harm to others?:  (UTA) Assessment of Violence:  (UTA) Violent Behavior Description:  (UTA) Does patient have access to weapons?:  (UTA) Criminal Charges Pending?:  (UTA) Does patient have a court date:  (UTA) Is patient on probation?:  (UTA)  Psychosis Hallucinations:  (UTA) Delusions:  (UTA)  Mental Status Report Appearance/Hygiene: In scrubs Eye Contact: Poor Motor Activity: Agitation Speech:  (UTA) Level of Consciousness: Drowsy, Irritable Mood: Labile Affect: Anxious Anxiety Level:  (UTA) Thought Processes: Unable to Assess Judgement: Unable to Assess Orientation: Unable to assess Obsessive Compulsive Thoughts/Behaviors: Unable to Assess  Cognitive Functioning Concentration: Unable to Assess Memory: Unable to Assess IQ:  (UTA) Insight:  (UTA) Impulse Control:  (UTA) Appetite:  (UTA) Weight Loss:  (UTA) Weight Gain:  (UTA)  ADLScreening [facility] Assessment Services) Patient's cognitive ability adequate to safely complete daily activities?: No Patient able to express need for assistance with ADLs?:  (UTA) Independently performs ADLs?:  (UTA)  Prior Inpatient Therapy Prior Inpatient Therapy: Yes Prior Therapy Dates: 2017 Prior Therapy Facilty/Provider(s):  [facility] Reason for Treatment: MH issues  Prior Outpatient Therapy Prior Outpatient Therapy:  (UTA) Prior Therapy Dates:  (UTA) Prior Therapy Facilty/Provider(s):  (UTA) Reason for Treatment:  (UTA) Does patient have an ACCT team?: Unknown Does patient have Intensive In-House Services?  : Unknown Does patient have [facility] services? : Unknown Does patient have P4CC services?: Unknown  ADL Screening (condition at time of admission) Patient's cognitive ability adequate to  safely complete daily activities?: No Is the patient deaf or have difficulty hearing?:  (UTA) Does the patient have difficulty seeing, even when wearing glasses/contacts?:  (UTA) Does the patient have difficulty concentrating, remembering, or making decisions?: Yes Patient able to express need for assistance with ADLs?:  (UTA) Does the patient have difficulty dressing or bathing?:  (UTA) Independently performs ADLs?:  (UTA) Does the patient have difficulty walking or climbing stairs?:  (UTA) Weakness of Legs:  (UTA) Weakness of Arms/Hands:  (UTA)  Home Assistive Devices/Equipment Home Assistive Devices/Equipment:  (UTA)  Therapy Consults (therapy consults require a physician order) PT Evaluation Needed: No OT Evalulation Needed: No SLP Evaluation Needed: No Abuse/Neglect Assessment (Assessment to be complete while patient is alone) Physical Abuse: Denies Verbal Abuse: Denies Sexual Abuse: Denies Exploitation of patient/patient's resources: Denies Self-Neglect: Denies Values / Beliefs Cultural Requests During Hospitalization: None Spiritual Requests During Hospitalization: None Consults Spiritual Care Consult Needed: No Social Work Consult Needed: No Regulatory affairs officer (For Healthcare) Does Patient Have a Medical Advance Directive?: No Would patient like information on creating a medical advance directive?: No - Patient declined    Additional Information 1:1 In Past 12 Months?: No CIRT Risk: No Elopement Risk: No Does patient have medical clearance?: Yes     Disposition: Case was staffed with [place] DNP who recommended patient be re-evaluated in the a.m.     Disposition Initial Assessment Completed for this Encounter: Yes Disposition of Patient: Other dispositions Type of inpatient treatment program: Adult Other disposition(s): Other (Comment) (re-eval in the a.m.)  On Site Evaluation by:   Reviewed with Physician:    Mamie Nick 07/07/2016 3:01 PM

## 2016-07-08 ENCOUNTER — Other Ambulatory Visit: Payer: Self-pay

## 2016-07-08 DIAGNOSIS — F251 Schizoaffective disorder, depressive type: Secondary | ICD-10-CM | POA: Diagnosis not present

## 2016-07-08 DIAGNOSIS — F17210 Nicotine dependence, cigarettes, uncomplicated: Secondary | ICD-10-CM | POA: Diagnosis not present

## 2016-07-08 DIAGNOSIS — F250 Schizoaffective disorder, bipolar type: Secondary | ICD-10-CM | POA: Diagnosis present

## 2016-07-08 DIAGNOSIS — Z79899 Other long term (current) drug therapy: Secondary | ICD-10-CM | POA: Diagnosis not present

## 2016-07-08 LAB — URINALYSIS, ROUTINE W REFLEX MICROSCOPIC
Bilirubin Urine: NEGATIVE
GLUCOSE, UA: NEGATIVE mg/dL
Hgb urine dipstick: NEGATIVE
Ketones, ur: 5 mg/dL — AB
Nitrite: NEGATIVE
PH: 5 (ref 5.0–8.0)
Protein, ur: 30 mg/dL — AB
Specific Gravity, Urine: 1.032 — ABNORMAL HIGH (ref 1.005–1.030)

## 2016-07-08 LAB — RAPID URINE DRUG SCREEN, HOSP PERFORMED
AMPHETAMINES: NOT DETECTED
BARBITURATES: NOT DETECTED
BENZODIAZEPINES: POSITIVE — AB
COCAINE: NOT DETECTED
Opiates: POSITIVE — AB
Tetrahydrocannabinol: NOT DETECTED

## 2016-07-08 MED ORDER — VENLAFAXINE HCL ER 75 MG PO CP24
150.0000 mg | ORAL_CAPSULE | Freq: Every day | ORAL | Status: DC
  Administered 2016-07-09: 150 mg via ORAL
  Filled 2016-07-08: qty 2

## 2016-07-08 MED ORDER — TEMAZEPAM 15 MG PO CAPS
15.0000 mg | ORAL_CAPSULE | Freq: Every evening | ORAL | Status: DC | PRN
  Administered 2016-07-08: 15 mg via ORAL
  Filled 2016-07-08: qty 1

## 2016-07-08 MED ORDER — BENZTROPINE MESYLATE 1 MG PO TABS: 1.0000 mg | ORAL_TABLET | Freq: Two times a day (BID) | ORAL | Status: DC

## 2016-07-08 MED ORDER — AMANTADINE HCL 100 MG PO CAPS
100.0000 mg | ORAL_CAPSULE | Freq: Two times a day (BID) | ORAL | Status: DC
  Administered 2016-07-08 – 2016-07-09 (×2): 100 mg via ORAL
  Filled 2016-07-08 (×2): qty 1

## 2016-07-08 MED ORDER — CEPHALEXIN 500 MG PO CAPS
500.0000 mg | ORAL_CAPSULE | Freq: Two times a day (BID) | ORAL | Status: DC
  Administered 2016-07-08 – 2016-07-09 (×3): 500 mg via ORAL
  Filled 2016-07-08 (×3): qty 1

## 2016-07-08 NOTE — BH Assessment (Signed)
[place] Assessment Progress Note  Per Corena Pilgrim, MD, this pt requires psychiatric hospitalization at this time.  Pt presents under IVC initiated by her Education officer, museum and upheld by Dr Darleene Cleaver.  The following facilities have been contacted to seek placement for this pt, with results as noted:  Beds available, information sent, decision pending:  [facility]   Declined:  [facility] (due to pt aggression) [facility] (due to financial considerations)   At capacity:  [place], [place] Triage Specialist [phone]

## 2016-07-08 NOTE — ED Notes (Signed)
Pt took all pills but then spat out 1 cogentin that was dissolving.  Pt began yelling and threw the pill in the floor stating "I'm not going to take this, it will make me vomit."

## 2016-07-08 NOTE — Progress Notes (Signed)
CSW eceived a call [place] stating physician denied pt at this time for treatment.  Alphonse Guild. Spenser Cong, Latanya Presser, LCAS Clinical Social Worker Ph: [phone]

## 2016-07-08 NOTE — Consult Note (Signed)
[facility] Consult   Reason for Consult:  Hallucinations  Referring Physician:  EDP Patient Identification: Dominique Acosta MRN:  [id number] Principal Diagnosis: Schizoaffective disorder, depressive type [facility]) Diagnosis:   Patient Active Problem List   Diagnosis Date Noted  . Schizoaffective disorder, depressive type ([place]) [F25.1] 01/20/2011    Priority: High  . Schizoaffective disorder, bipolar type ([place]) [F25.0] 07/08/2016  . Delirium [R41.0] 09/05/2014  . Acute encephalopathy [G93.40] 09/05/2014  . Chronic pain syndrome [G89.4] 09/05/2014  . Diarrhea [R19.7] 02/14/2011  . Seizure disorder ([place]) [J50.093] 09/02/2010  . Fracture, humerus, head [S42.293A] 11/05/2009  . CIGARETTE SMOKER [F17.200] 09/05/2007  . CHRONIC AIRWAY OBSTRUCTION NEC [J44.9] 07/11/2007  . Hypothyroidism [E03.9] 06/08/2006  . Hyperlipemia [E78.5] 06/08/2006    Total Time spent with patient: 45 minutes  Subjective:   Dominique Acosta is a 66 y.o. female patient admitted with hallucinations.  HPI:  On admission;  66 y.o. female that presents this date under IVC. Per IVC: "Respondent has a long history of mental illness. Lately respondent has yelling and screaming in her home while alone. The respondent came out to the [place] area and started beating on other resident's doors asking for her deceased mother. According to petitioner the respondent has been making threats to her health care worker slamming the door on her worker's foot. Respondent has pills scattered around on her floor and randomly picks them up and takes them". This writer attempts to assess patient unsuccessfully. Patient does not seem to be aware that writer is in the room. Patient seems to be responding to internal stimuli and is talking to herself incoherently. Patient is not oriented to time/place. Information for the purposes of assessment was obtained from IVC and admission notes. This Probation officer also unsuccessfully attempted to contact  social worker Event organiser to obtain information although CSW could not be reached. Per notes: "Patient presents to the emergency department from a facility after involuntary commitment forms were filled out by the social worker. Apparently she's been acting out and threatening multiple staff members and residents. She has had physical alterations with multiple staff members. Apparently she is noncompliant with her medications and they found pills thrown about her room. The patient continues to speak to herself and looked incessantly around the room". Case was staffed with Lilla Shook DNP who recommended patient be re-evaluated in the a.m.   Today, she is not aggressive but reports feeling unstable and not feeling safe.  Some hallucinations voiced but no homicidal ideations or alcohol/drug abuse.  Geriatric psych needed.  Past Psychiatric History: schizoaffective disorder  Risk to Self: NOne Risk to Others:  NOne Prior Inpatient Therapy: Prior Inpatient Therapy: Yes Prior Therapy Dates: 2017 Prior Therapy Facilty/Provider(s): [facility] Reason for Treatment: MH issues Prior Outpatient Therapy: Prior Outpatient Therapy:  (UTA) Prior Therapy Dates:  (UTA) Prior Therapy Facilty/Provider(s):  (UTA) Reason for Treatment:  (UTA) Does patient have an ACCT team?: Unknown Does patient have Intensive In-House Services?  : Unknown Does patient have [facility] services? : Unknown Does patient have P4CC services?: Unknown  Past Medical History:  Past Medical History:  Diagnosis Date  . Arm pain   . Chronic pain   . COPD (chronic obstructive pulmonary disease) ([place])   . Coronary artery disease   . Depression   . Dystonia   . Epilepsy ([place])   . High cholesterol   . Humerus fracture   . Hypothyroid   . Leukemia ([place])   . Myocardial infarct   . Schizophrenia ([place])   .  Seizure [facility])     Past Surgical History:  Procedure Laterality Date  . EXTERNAL EAR SURGERY    . HUMERUS FRACTURE SURGERY    .  SHOULDER SURGERY     Family History: No family history on file. Family Psychiatric  History: none Social History:  History  Alcohol Use No     History  Drug Use No    Social History   Social History  . Marital status: Single    Spouse name: N/A  . Number of children: N/A  . Years of education: N/A   Social History Main Topics  . Smoking status: Current Every Day Smoker    Packs/day: 2.00    Years: 41.00    Types: Cigarettes  . Smokeless tobacco: None  . Alcohol use No  . Drug use: No  . Sexual activity: No   Other Topics Concern  . None   Social History Narrative  . None   Additional Social History:    Allergies:   Allergies  Allergen Reactions  . Amoxicillin Anaphylaxis  . Penicillins Anaphylaxis    Has patient had a PCN reaction causing immediate rash, facial/tongue/throat swelling, SOB or lightheadedness with hypotension: yes Has patient had a PCN reaction causing severe rash involving mucus membranes or skin necrosis: no Has patient had a PCN reaction that required hospitalization: yes Has patient had a PCN reaction occurring within the last 10 years: no If all of the above answers are "NO", then may proceed with Cephalosporin use.   Marland Kitchen Phenytoin Other (See Comments)    Other Reaction: CNS Disorder  . Dilaudid [Hydromorphone Hcl] Other (See Comments)    Psychosis (per patient)  . Haldol [Haloperidol]     Psychosis per caregiver  . Morphine And Related Other (See Comments)    psychosis    Labs:  Results for orders placed or performed during the hospital encounter of 07/07/16 (from the past 48 hour(s))  CBC     Status: Abnormal   Collection Time: 07/07/16  1:17 PM  Result Value Ref Range   WBC 11.3 (H) 4.0 - 10.5 K/uL   RBC 4.10 3.87 - 5.11 MIL/uL   Hemoglobin 12.5 12.0 - 15.0 g/dL   HCT 98.8 38.4 - 68.6 %   MCV 95.4 78.0 - 100.0 fL   MCH 30.5 26.0 - 34.0 pg   MCHC 32.0 30.0 - 36.0 g/dL   RDW 81.0 84.3 - 68.9 %   Platelets 332 150 - 400 K/uL   Comprehensive metabolic panel     Status: Abnormal   Collection Time: 07/07/16  1:17 PM  Result Value Ref Range   Sodium 143 135 - 145 mmol/L   Potassium 3.6 3.5 - 5.1 mmol/L   Chloride 110 101 - 111 mmol/L   CO2 27 22 - 32 mmol/L   Glucose, Bld 94 65 - 99 mg/dL   BUN 12 6 - 20 mg/dL   Creatinine, Ser 0.56 0.44 - 1.00 mg/dL   Calcium 8.6 (L) 8.9 - 10.3 mg/dL   Total Protein 6.9 6.5 - 8.1 g/dL   Albumin 3.9 3.5 - 5.0 g/dL   AST 25 15 - 41 U/L   ALT 24 14 - 54 U/L   Alkaline Phosphatase 75 38 - 126 U/L   Total Bilirubin 0.4 0.3 - 1.2 mg/dL   GFR calc non Af Amer >60 >60 mL/min   GFR calc Af Amer >60 >60 mL/min    Comment: (NOTE) The eGFR has been calculated using the CKD EPI  equation. This calculation has not been validated in all clinical situations. eGFR's persistently <60 mL/min signify possible Chronic Kidney Disease.    Anion gap 6 5 - 15  Ethanol     Status: None   Collection Time: 07/07/16  1:17 PM  Result Value Ref Range   Alcohol, Ethyl (B) <5 <5 mg/dL    Comment:        LOWEST DETECTABLE LIMIT FOR SERUM ALCOHOL IS 5 mg/dL FOR MEDICAL PURPOSES ONLY   Urinalysis, Routine w reflex microscopic     Status: Abnormal   Collection Time: 07/07/16 11:59 PM  Result Value Ref Range   Color, Urine YELLOW YELLOW   APPearance HAZY (A) CLEAR   Specific Gravity, Urine 1.032 (H) 1.005 - 1.030   pH 5.0 5.0 - 8.0   Glucose, UA NEGATIVE NEGATIVE mg/dL   Hgb urine dipstick NEGATIVE NEGATIVE   Bilirubin Urine NEGATIVE NEGATIVE   Ketones, ur 5 (A) NEGATIVE mg/dL   Protein, ur 30 (A) NEGATIVE mg/dL   Nitrite NEGATIVE NEGATIVE   Leukocytes, UA MODERATE (A) NEGATIVE   RBC / HPF 0-5 0 - 5 RBC/hpf   WBC, UA 6-30 0 - 5 WBC/hpf   Bacteria, UA RARE (A) NONE SEEN   Squamous Epithelial / LPF 0-5 (A) NONE SEEN   Mucous PRESENT   Rapid urine drug screen (hospital performed)     Status: Abnormal   Collection Time: 07/07/16 11:59 PM  Result Value Ref Range   Opiates POSITIVE (A) NONE  DETECTED   Cocaine NONE DETECTED NONE DETECTED   Benzodiazepines POSITIVE (A) NONE DETECTED   Amphetamines NONE DETECTED NONE DETECTED   Tetrahydrocannabinol NONE DETECTED NONE DETECTED   Barbiturates NONE DETECTED NONE DETECTED    Comment:        DRUG SCREEN FOR MEDICAL PURPOSES ONLY.  IF CONFIRMATION IS NEEDED FOR ANY PURPOSE, NOTIFY LAB WITHIN 5 DAYS.        LOWEST DETECTABLE LIMITS FOR URINE DRUG SCREEN Drug Class       Cutoff (ng/mL) Amphetamine      1000 Barbiturate      200 Benzodiazepine   200 Tricyclics       300 Opiates          300 Cocaine          300 THC              50     Current Facility-Administered Medications  Medication Dose Route Frequency Provider Last Rate Last Dose  . acetaminophen (TYLENOL) tablet 650 mg  650 mg Oral Q4H PRN Azalia Bilis, MD   650 mg at 07/08/16 0858  . acyclovir (ZOVIRAX) 200 MG capsule 200 mg  200 mg Oral BID Azalia Bilis, MD   200 mg at 07/08/16 1131  . albuterol (PROVENTIL HFA;VENTOLIN HFA) 108 (90 Base) MCG/ACT inhaler 1-2 puff  1-2 puff Inhalation TID PRN Azalia Bilis, MD      . alum & mag hydroxide-simeth (MAALOX/MYLANTA) 200-200-20 MG/5ML suspension 30 mL  30 mL Oral PRN Azalia Bilis, MD      . amantadine (SYMMETREL) capsule 100 mg  100 mg Oral Daily Azalia Bilis, MD   100 mg at 07/08/16 1132  . ARIPiprazole (ABILIFY) tablet 20 mg  20 mg Oral Daily Azalia Bilis, MD   20 mg at 07/08/16 1130  . aspirin chewable tablet 81 mg  81 mg Oral Daily Azalia Bilis, MD   81 mg at 07/08/16 1130  . atorvastatin (LIPITOR) tablet 40 mg  40  mg Oral q1800 Jola Schmidt, MD      . baclofen (LIORESAL) tablet 10 mg  10 mg Oral TID Jola Schmidt, MD   10 mg at 07/08/16 1133  . benztropine (COGENTIN) tablet 2 mg  2 mg Oral TID Jola Schmidt, MD   2 mg at 07/08/16 1130  . bisacodyl (DULCOLAX) EC tablet 5 mg  5 mg Oral Daily PRN Jola Schmidt, MD      . cephALEXin [facility]) capsule 500 mg  500 mg Oral Q12H Jola Schmidt, MD   500 mg at 07/08/16 1131  .  darifenacin (ENABLEX) 24 hr tablet 7.5 mg  7.5 mg Oral Daily Jola Schmidt, MD   7.5 mg at 07/08/16 1130  . gabapentin (NEURONTIN) capsule 400 mg  400 mg Oral TID WC Jola Schmidt, MD   400 mg at 07/08/16 1132  . ibuprofen (ADVIL,MOTRIN) tablet 600 mg  600 mg Oral Q8H PRN Jola Schmidt, MD      . levothyroxine (SYNTHROID, LEVOTHROID) tablet 88 mcg  88 mcg Oral QAC breakfast Jola Schmidt, MD   88 mcg at 07/08/16 0857  . ondansetron (ZOFRAN) tablet 4 mg  4 mg Oral Q8H PRN Jola Schmidt, MD      . propranolol (INDERAL) tablet 10 mg  10 mg Oral BID Jola Schmidt, MD   10 mg at 07/08/16 1133  . [START ON 07/09/2016] venlafaxine XR (EFFEXOR-XR) 24 hr capsule 150 mg  150 mg Oral Q breakfast Corena Pilgrim, MD       Current Outpatient Prescriptions  Medication Sig Dispense Refill  . ABILIFY MAINTENA 400 MG PRSY Inject 400 mg as directed every 30 (thirty) days.    Marland Kitchen acetaminophen-codeine (TYLENOL #3) 300-30 MG tablet Take 1 tablet by mouth 4 (four) times daily.     Marland Kitchen acyclovir (ZOVIRAX) 200 MG capsule Take 200 mg by mouth 2 (two) times daily.    Marland Kitchen albuterol (PROVENTIL,VENTOLIN) 90 MCG/ACT inhaler Inhale 2 puffs into the lungs as directed. 1-2 puffs up to three times a day prn (Patient taking differently: Inhale 1-2 puffs into the lungs 3 (three) times daily as needed for wheezing or shortness of breath. 1-2 puffs up to three times a day prn) 17 g 5  . amantadine (SYMMETREL) 100 MG capsule Take 100 mg by mouth daily.    . ARIPiprazole (ABILIFY) 20 MG tablet Take 1 tablet (20 mg total) by mouth daily. 30 tablet 0  . baclofen (LIORESAL) 10 MG tablet Take 10 mg by mouth 3 (three) times daily.     . benztropine (COGENTIN) 2 MG tablet Take 1 tablet by mouth 3 (three) times daily.    Marland Kitchen darifenacin (ENABLEX) 7.5 MG 24 hr tablet Take 1 tablet (7.5 mg total) by mouth daily. 30 tablet 0  . gabapentin (NEURONTIN) 400 MG capsule Take 1 capsule (400 mg total) by mouth 3 (three) times daily with meals. 90 capsule 0  .  levothyroxine (SYNTHROID, LEVOTHROID) 88 MCG tablet Take 88 mcg by mouth daily.     Marland Kitchen LORazepam (ATIVAN) 1 MG tablet Take 1 tablet by mouth 2 (two) times daily.     . Oxycodone HCl 10 MG TABS Take 1 tablet by mouth 4 (four) times daily.     Marland Kitchen PROAIR HFA 108 (90 Base) MCG/ACT inhaler 2 puffs every 6 (six) hours as needed for wheezing or shortness of breath.     . propranolol (INDERAL) 10 MG tablet Take 10 mg by mouth 2 (two) times daily.    . simvastatin (ZOCOR)  40 MG tablet Take 40 mg by mouth daily.    . solifenacin (VESICARE) 10 MG tablet Take 10 mg by mouth daily.    . temazepam (RESTORIL) 30 MG capsule Take 30 mg by mouth at bedtime.    Marland Kitchen venlafaxine (EFFEXOR-XR) 150 MG 24 hr capsule Take 150 mg by mouth 2 (two) times daily. Reported on 10/27/2015    . aspirin 81 MG tablet Take 81 mg by mouth daily.     . bisacodyl (DULCOLAX) 5 MG EC tablet Take 5 mg by mouth daily as needed for moderate constipation.    . diphenhydrAMINE (BENADRYL) 25 mg capsule Take 25 mg by mouth every 6 (six) hours as needed for itching, allergies or sleep.    Marland Kitchen docusate sodium (COLACE) 100 MG capsule Take 100 mg by mouth daily as needed for mild constipation or moderate constipation.      Musculoskeletal: Strength & Muscle Tone: within normal limits Gait & Station: normal Patient leans: N/A  Psychiatric Specialty Exam: Physical Exam  Constitutional: She is oriented to person, place, and time. She appears well-developed and well-nourished.  HENT:  Head: Normocephalic.  Neck: Normal range of motion.  Respiratory: Effort normal.  Musculoskeletal: Normal range of motion.  Neurological: She is alert and oriented to person, place, and time.  Psychiatric: Her speech is tangential. She is agitated and actively hallucinating. Thought content is paranoid. Cognition and memory are normal. She expresses impulsivity. She exhibits a depressed mood.    Review of Systems  Psychiatric/Behavioral: Positive for depression and  hallucinations.  All other systems reviewed and are negative.   Blood pressure (!) 121/55, pulse 67, temperature 99.2 F (37.3 C), temperature source Oral, resp. rate 18, SpO2 93 %.There is no height or weight on file to calculate BMI.  General Appearance: Casual  Eye Contact:  Fair  Speech:  Normal Rate  Volume:  Normal  Mood:  Anxious and Depressed  Affect:  Congruent  Thought Process:  Coherent and Descriptions of Associations: Tangential  Orientation:  Full (Time, Place, and Person)  Thought Content:  Tangential  Suicidal Thoughts:  No  Homicidal Thoughts:  No  Memory:  Immediate;   Fair Recent;   Fair Remote;   Fair  Judgement:  Impaired  Insight:  Fair  Psychomotor Activity:  Normal  Concentration:  Concentration: Fair and Attention Span: Fair  Recall:  [employer] of Knowledge:  Fair  Language:  Good  Akathisia:  No  Handed:  Right  AIMS (if indicated):     Assets:  Leisure Time Physical Health Resilience  ADL's:  Intact  Cognition:  Impaired,  Mild  Sleep:        Treatment Plan Summary: Daily contact with patient to assess and evaluate symptoms and progress in treatment, Medication management and Plan schizoaffective disorder, depressed mood:  -Crisis stabilization -Medication management: Medical medications along with Abilify 20 mg daily for mood stabilization, Effexor 150 mg daily for depression, Cogentin discontinued, increased Amantadine 100 mg daily to BID for EPS -Individual counseling  Disposition: Recommend psychiatric Inpatient admission when medically cleared.  Waylan Boga, NP 07/08/2016 12:14 PM  Patient seen face-to-face for psychiatric evaluation, chart reviewed and case discussed with the physician extender and developed treatment plan. Reviewed the information documented and agree with the treatment plan. Corena Pilgrim, MD

## 2016-07-08 NOTE — ED Notes (Addendum)
Urine cup given with instruction. EKG given to disposition specialist.

## 2016-07-08 NOTE — ED Notes (Addendum)
Pt stated "I don't know what happened this morning.  There was 10 or 12 police that came to my house."  Pt is A&O to date, place & time.  Pt presents with ?abscess to left axilla.  Dr. Kathrynn Humble informed.

## 2016-07-08 NOTE — ED Notes (Signed)
Pt provided chicken noodle soup & saltines.

## 2016-07-08 NOTE — ED Notes (Signed)
Patient states "I don't know why I'm here". Alert and oriented. POC covered.

## 2016-07-09 ENCOUNTER — Encounter (HOSPITAL_COMMUNITY): Payer: Self-pay | Admitting: Registered Nurse

## 2016-07-09 DIAGNOSIS — Z79899 Other long term (current) drug therapy: Secondary | ICD-10-CM | POA: Diagnosis not present

## 2016-07-09 DIAGNOSIS — F251 Schizoaffective disorder, depressive type: Secondary | ICD-10-CM

## 2016-07-09 DIAGNOSIS — F17210 Nicotine dependence, cigarettes, uncomplicated: Secondary | ICD-10-CM

## 2016-07-09 LAB — URINE CULTURE

## 2016-07-09 MED ORDER — CEPHALEXIN 500 MG PO CAPS: 500.0000 mg | ORAL_CAPSULE | Freq: Two times a day (BID) | ORAL | 0 refills | Status: AC

## 2016-07-09 MED ORDER — DIPHENHYDRAMINE HCL 25 MG PO CAPS
25.0000 mg | ORAL_CAPSULE | Freq: Once | ORAL | Status: AC
  Administered 2016-07-09: 25 mg via ORAL
  Filled 2016-07-09: qty 1

## 2016-07-09 MED ORDER — TEMAZEPAM 15 MG PO CAPS: 15.0000 mg | ORAL_CAPSULE | Freq: Every evening | ORAL | 0 refills | Status: DC | PRN

## 2016-07-09 NOTE — Consult Note (Signed)
[place] [facility] Consult   Reason for Consult:  Hallucinations  Referring Physician:  EDP Patient Identification: Dominique Acosta MRN:  [id number] Principal Diagnosis: Schizoaffective disorder, depressive type [facility]) Diagnosis:   Patient Active Problem List   Diagnosis Date Noted  . Schizoaffective disorder, bipolar type ([place]) [F25.0] 07/08/2016  . Delirium [R41.0] 09/05/2014  . Acute encephalopathy [G93.40] 09/05/2014  . Chronic pain syndrome [G89.4] 09/05/2014  . Diarrhea [R19.7] 02/14/2011  . Schizoaffective disorder, depressive type ([place]) [F25.1] 01/20/2011  . Seizure disorder ([place]) [J24.268] 09/02/2010  . Fracture, humerus, head [S42.293A] 11/05/2009  . CIGARETTE SMOKER [F17.200] 09/05/2007  . CHRONIC AIRWAY OBSTRUCTION NEC [J44.9] 07/11/2007  . Hypothyroidism [E03.9] 06/08/2006  . Hyperlipemia [E78.5] 06/08/2006    Total Time spent with patient: 45 minutes  Subjective:   Dominique Acosta is a 66 y.o. female patient admitted with hallucinations.  HPI:  On admission;  66 y.o. female that presents this date under IVC. Per IVC: "Respondent has a long history of mental illness. Lately respondent has yelling and screaming in her home while alone. The respondent came out to the [place] area and started beating on other resident's doors asking for her deceased mother. According to petitioner the respondent has been making threats to her health care worker slamming the door on her worker's foot. Respondent has pills scattered around on her floor and randomly picks them up and takes them". This writer attempts to assess patient unsuccessfully. Patient does not seem to be aware that writer is in the room. Patient seems to be responding to internal stimuli and is talking to herself incoherently. Patient is not oriented to time/place. Information for the purposes of assessment was obtained from IVC and admission notes. This Probation officer also unsuccessfully attempted to contact social worker Academic librarian to obtain information although CSW could not be reached. Per notes: "Patient presents to the emergency department from a facility after involuntary commitment forms were filled out by the social worker. Apparently she's been acting out and threatening multiple staff members and residents. She has had physical alterations with multiple staff members. Apparently she is noncompliant with her medications and they found pills thrown about her room. The patient continues to speak to herself and looked incessantly around the room". Case was staffed with Lilla Shook DNP who recommended patient be re-evaluated in the a.m.   Today:  Dominique Acosta 66 y.o. female patient seen by Dr. Adele Schilder and this provider.  Chart reviewed 07/09/16.   On evaluation:  Dominique Acosta is alert and oriented X 4 reports that she is feeling much better.  Denies hallucinations, paranoia, and suicidal/homicidal ideation.  Reports that she has Dominique Acosta for outpatient medication management.  Patient responding well to  medications without adverse reactions.    Past Psychiatric History: schizoaffective disorder  Risk to Self: NOne Risk to Others:  NOne Prior Inpatient Therapy: Prior Inpatient Therapy: Yes Prior Therapy Dates: 2017 Prior Therapy Facilty/Provider(s): [facility] Reason for Treatment: MH issues Prior Outpatient Therapy: Prior Outpatient Therapy:  (UTA) Prior Therapy Dates:  (UTA) Prior Therapy Facilty/Provider(s):  (UTA) Reason for Treatment:  (UTA) Does patient have an ACCT team?: Unknown Does patient have Intensive In-House Services?  : Unknown Does patient have [facility] services? : Unknown Does patient have P4CC services?: Unknown  Past Medical History:  Past Medical History:  Diagnosis Date  . Arm pain   . Chronic pain   . COPD (chronic obstructive pulmonary disease) ([place])   . Coronary artery disease   . Depression   . Dystonia   .  Epilepsy ([place])   . High cholesterol   . Humerus fracture   .  Hypothyroid   . Leukemia ([place])   . Myocardial infarct   . Schizophrenia ([place])   . Seizure [facility])     Past Surgical History:  Procedure Laterality Date  . EXTERNAL EAR SURGERY    . HUMERUS FRACTURE SURGERY    . SHOULDER SURGERY     Family History: History reviewed. No pertinent family history. Family Psychiatric  History: none Social History:  History  Alcohol Use No     History  Drug Use No    Social History   Social History  . Marital status: Single    Spouse name: N/A  . Number of children: N/A  . Years of education: N/A   Social History Main Topics  . Smoking status: Current Every Day Smoker    Packs/day: 2.00    Years: 41.00    Types: Cigarettes  . Smokeless tobacco: None  . Alcohol use No  . Drug use: No  . Sexual activity: No   Other Topics Concern  . None   Social History Narrative  . None   Additional Social History:    Allergies:   Allergies  Allergen Reactions  . Amoxicillin Anaphylaxis  . Penicillins Anaphylaxis    Has patient had a PCN reaction causing immediate rash, facial/tongue/throat swelling, SOB or lightheadedness with hypotension: yes Has patient had a PCN reaction causing severe rash involving mucus membranes or skin necrosis: no Has patient had a PCN reaction that required hospitalization: yes Has patient had a PCN reaction occurring within the last 10 years: no If all of the above answers are "NO", then may proceed with Cephalosporin use.   Marland Kitchen Phenytoin Other (See Comments)    Other Reaction: CNS Disorder  . Dilaudid [Hydromorphone Hcl] Other (See Comments)    Psychosis (per patient)  . Haldol [Haloperidol]     Psychosis per caregiver  . Morphine And Related Other (See Comments)    psychosis    Labs:  Results for orders placed or performed during the hospital encounter of 07/07/16 (from the past 48 hour(s))  CBC     Status: Abnormal   Collection Time: 07/07/16  1:17 PM  Result Value Ref Range   WBC 11.3 (H) 4.0 - 10.5 K/uL    RBC 4.10 3.87 - 5.11 MIL/uL   Hemoglobin 12.5 12.0 - 15.0 g/dL   HCT 39.1 36.0 - 46.0 %   MCV 95.4 78.0 - 100.0 fL   MCH 30.5 26.0 - 34.0 pg   MCHC 32.0 30.0 - 36.0 g/dL   RDW 14.4 11.5 - 15.5 %   Platelets 332 150 - 400 K/uL  Comprehensive metabolic panel     Status: Abnormal   Collection Time: 07/07/16  1:17 PM  Result Value Ref Range   Sodium 143 135 - 145 mmol/L   Potassium 3.6 3.5 - 5.1 mmol/L   Chloride 110 101 - 111 mmol/L   CO2 27 22 - 32 mmol/L   Glucose, Bld 94 65 - 99 mg/dL   BUN 12 6 - 20 mg/dL   Creatinine, Ser 0.74 0.44 - 1.00 mg/dL   Calcium 8.6 (L) 8.9 - 10.3 mg/dL   Total Protein 6.9 6.5 - 8.1 g/dL   Albumin 3.9 3.5 - 5.0 g/dL   AST 25 15 - 41 U/L   ALT 24 14 - 54 U/L   Alkaline Phosphatase 75 38 - 126 U/L   Total Bilirubin 0.4 0.3 -  1.2 mg/dL   GFR calc non Af Amer >60 >60 mL/min   GFR calc Af Amer >60 >60 mL/min    Comment: (NOTE) The eGFR has been calculated using the CKD EPI equation. This calculation has not been validated in all clinical situations. eGFR's persistently <60 mL/min signify possible Chronic Kidney Disease.    Anion gap 6 5 - 15  Ethanol     Status: None   Collection Time: 07/07/16  1:17 PM  Result Value Ref Range   Alcohol, Ethyl (B) <5 <5 mg/dL    Comment:        LOWEST DETECTABLE LIMIT FOR SERUM ALCOHOL IS 5 mg/dL FOR MEDICAL PURPOSES ONLY   Urinalysis, Routine w reflex microscopic     Status: Abnormal   Collection Time: 07/07/16 11:59 PM  Result Value Ref Range   Color, Urine YELLOW YELLOW   APPearance HAZY (A) CLEAR   Specific Gravity, Urine 1.032 (H) 1.005 - 1.030   pH 5.0 5.0 - 8.0   Glucose, UA NEGATIVE NEGATIVE mg/dL   Hgb urine dipstick NEGATIVE NEGATIVE   Bilirubin Urine NEGATIVE NEGATIVE   Ketones, ur 5 (A) NEGATIVE mg/dL   Protein, ur 30 (A) NEGATIVE mg/dL   Nitrite NEGATIVE NEGATIVE   Leukocytes, UA MODERATE (A) NEGATIVE   RBC / HPF 0-5 0 - 5 RBC/hpf   WBC, UA 6-30 0 - 5 WBC/hpf   Bacteria, UA RARE (A)  NONE SEEN   Squamous Epithelial / LPF 0-5 (A) NONE SEEN   Mucous PRESENT   Rapid urine drug screen (hospital performed)     Status: Abnormal   Collection Time: 07/07/16 11:59 PM  Result Value Ref Range   Opiates POSITIVE (A) NONE DETECTED   Cocaine NONE DETECTED NONE DETECTED   Benzodiazepines POSITIVE (A) NONE DETECTED   Amphetamines NONE DETECTED NONE DETECTED   Tetrahydrocannabinol NONE DETECTED NONE DETECTED   Barbiturates NONE DETECTED NONE DETECTED    Comment:        DRUG SCREEN FOR MEDICAL PURPOSES ONLY.  IF CONFIRMATION IS NEEDED FOR ANY PURPOSE, NOTIFY LAB WITHIN 5 DAYS.        LOWEST DETECTABLE LIMITS FOR URINE DRUG SCREEN Drug Class       Cutoff (ng/mL) Amphetamine      1000 Barbiturate      200 Benzodiazepine   510 Tricyclics       258 Opiates          300 Cocaine          300 THC              50   Urine culture     Status: Abnormal   Collection Time: 07/07/16 11:59 PM  Result Value Ref Range   Specimen Description URINE, CLEAN CATCH    Special Requests NONE    Culture MULTIPLE SPECIES PRESENT, SUGGEST RECOLLECTION (A)    Report Status 07/09/2016 FINAL     Current Facility-Administered Medications  Medication Dose Route Frequency Provider Last Rate Last Dose  . acetaminophen (TYLENOL) tablet 650 mg  650 mg Oral Q4H PRN Jola Schmidt, MD   650 mg at 07/09/16 0140  . acyclovir (ZOVIRAX) 200 MG capsule 200 mg  200 mg Oral BID Jola Schmidt, MD   200 mg at 07/09/16 0945  . albuterol (PROVENTIL HFA;VENTOLIN HFA) 108 (90 Base) MCG/ACT inhaler 1-2 puff  1-2 puff Inhalation TID PRN Jola Schmidt, MD      . alum & mag hydroxide-simeth (MAALOX/MYLANTA) 200-200-20 MG/5ML suspension 30 mL  30 mL Oral PRN Jola Schmidt, MD      . amantadine (SYMMETREL) capsule 100 mg  100 mg Oral BID Patrecia Pour, NP   100 mg at 07/09/16 0940  . ARIPiprazole (ABILIFY) tablet 20 mg  20 mg Oral Daily Jola Schmidt, MD   20 mg at 07/09/16 0940  . aspirin chewable tablet 81 mg  81 mg Oral Daily  Jola Schmidt, MD   81 mg at 07/09/16 0941  . atorvastatin (LIPITOR) tablet 40 mg  40 mg Oral q1800 Jola Schmidt, MD   40 mg at 07/08/16 1802  . baclofen (LIORESAL) tablet 10 mg  10 mg Oral TID Jola Schmidt, MD   10 mg at 07/09/16 0941  . bisacodyl (DULCOLAX) EC tablet 5 mg  5 mg Oral Daily PRN Jola Schmidt, MD      . cephALEXin [facility]) capsule 500 mg  500 mg Oral Q12H Jola Schmidt, MD   500 mg at 07/09/16 0941  . darifenacin (ENABLEX) 24 hr tablet 7.5 mg  7.5 mg Oral Daily Jola Schmidt, MD   7.5 mg at 07/09/16 0945  . gabapentin (NEURONTIN) capsule 400 mg  400 mg Oral TID WC Jola Schmidt, MD   400 mg at 07/09/16 0856  . ibuprofen (ADVIL,MOTRIN) tablet 600 mg  600 mg Oral Q8H PRN Jola Schmidt, MD   600 mg at 07/09/16 0748  . levothyroxine (SYNTHROID, LEVOTHROID) tablet 88 mcg  88 mcg Oral QAC breakfast Jola Schmidt, MD   88 mcg at 07/09/16 0855  . ondansetron (ZOFRAN) tablet 4 mg  4 mg Oral Q8H PRN Jola Schmidt, MD      . propranolol (INDERAL) tablet 10 mg  10 mg Oral BID Jola Schmidt, MD   10 mg at 07/09/16 0945  . temazepam (RESTORIL) capsule 15 mg  15 mg Oral QHS PRN Patrecia Pour, NP   15 mg at 07/08/16 2358  . venlafaxine XR (EFFEXOR-XR) 24 hr capsule 150 mg  150 mg Oral Q breakfast Corena Pilgrim, MD   150 mg at 07/09/16 2025   Current Outpatient Prescriptions  Medication Sig Dispense Refill  . ABILIFY MAINTENA 400 MG PRSY Inject 400 mg as directed every 30 (thirty) days.    Marland Kitchen acetaminophen-codeine (TYLENOL #3) 300-30 MG tablet Take 1 tablet by mouth 4 (four) times daily.     Marland Kitchen acyclovir (ZOVIRAX) 200 MG capsule Take 200 mg by mouth 2 (two) times daily.    Marland Kitchen albuterol (PROVENTIL,VENTOLIN) 90 MCG/ACT inhaler Inhale 2 puffs into the lungs as directed. 1-2 puffs up to three times a day prn (Patient taking differently: Inhale 1-2 puffs into the lungs 3 (three) times daily as needed for wheezing or shortness of breath. 1-2 puffs up to three times a day prn) 17 g 5  . amantadine (SYMMETREL) 100  MG capsule Take 100 mg by mouth daily.    . ARIPiprazole (ABILIFY) 20 MG tablet Take 1 tablet (20 mg total) by mouth daily. 30 tablet 0  . baclofen (LIORESAL) 10 MG tablet Take 10 mg by mouth 3 (three) times daily.     . benztropine (COGENTIN) 2 MG tablet Take 1 tablet by mouth 3 (three) times daily.    Marland Kitchen darifenacin (ENABLEX) 7.5 MG 24 hr tablet Take 1 tablet (7.5 mg total) by mouth daily. 30 tablet 0  . gabapentin (NEURONTIN) 400 MG capsule Take 1 capsule (400 mg total) by mouth 3 (three) times daily with meals. 90 capsule 0  . levothyroxine (SYNTHROID, LEVOTHROID) 88 MCG tablet  Take 88 mcg by mouth daily.     Marland Kitchen LORazepam (ATIVAN) 1 MG tablet Take 1 tablet by mouth 2 (two) times daily.     . Oxycodone HCl 10 MG TABS Take 1 tablet by mouth 4 (four) times daily.     Marland Kitchen PROAIR HFA 108 (90 Base) MCG/ACT inhaler 2 puffs every 6 (six) hours as needed for wheezing or shortness of breath.     . propranolol (INDERAL) 10 MG tablet Take 10 mg by mouth 2 (two) times daily.    . simvastatin (ZOCOR) 40 MG tablet Take 40 mg by mouth daily.    . solifenacin (VESICARE) 10 MG tablet Take 10 mg by mouth daily.    . temazepam (RESTORIL) 30 MG capsule Take 30 mg by mouth at bedtime.    Marland Kitchen venlafaxine (EFFEXOR-XR) 150 MG 24 hr capsule Take 150 mg by mouth 2 (two) times daily. Reported on 10/27/2015    . aspirin 81 MG tablet Take 81 mg by mouth daily.     . bisacodyl (DULCOLAX) 5 MG EC tablet Take 5 mg by mouth daily as needed for moderate constipation.    . diphenhydrAMINE (BENADRYL) 25 mg capsule Take 25 mg by mouth every 6 (six) hours as needed for itching, allergies or sleep.    Marland Kitchen docusate sodium (COLACE) 100 MG capsule Take 100 mg by mouth daily as needed for mild constipation or moderate constipation.      Musculoskeletal: Strength & Muscle Tone: within normal limits Gait & Station: normal Patient leans: N/A  Psychiatric Specialty Exam: Physical Exam  Nursing note and vitals reviewed. Constitutional: She  is oriented to person, place, and time. She appears well-developed and well-nourished.  HENT:  Head: Normocephalic.  Neck: Normal range of motion.  Respiratory: Effort normal.  Musculoskeletal: Normal range of motion.  Neurological: She is alert and oriented to person, place, and time.  Psychiatric: Her speech is normal and behavior is normal. Thought content normal. Her mood appears anxious. Thought content is not paranoid. Cognition and memory are normal. She expresses impulsivity. She expresses no homicidal and no suicidal ideation.    Review of Systems  Psychiatric/Behavioral: Positive for depression. Negative for hallucinations.  All other systems reviewed and are negative.   Blood pressure (!) 148/73, pulse 68, temperature 98.8 F (37.1 C), temperature source Oral, resp. rate 16, SpO2 96 %.There is no height or weight on file to calculate BMI.  General Appearance: Casual  Eye Contact:  Fair  Speech:  Normal Rate  Volume:  Normal  Mood:  Anxious  Affect:  Congruent  Thought Process:  Coherent and Descriptions of Associations: Tangential  Orientation:  Full (Time, Place, and Person)  Thought Content:  Logical  Suicidal Thoughts:  No  Homicidal Thoughts:  No  Memory:  Immediate;   Fair Recent;   Fair Remote;   Fair  Judgement:  Intact  Insight:  Present  Psychomotor Activity:  Normal  Concentration:  Concentration: Fair and Attention Span: Fair  Recall:  [employer] of Knowledge:  Fair  Language:  Good  Akathisia:  No  Handed:  Right  AIMS (if indicated):     Assets:  Leisure Time Physical Health Resilience  ADL's:  Intact  Cognition:  Impaired,  Mild  Sleep:        Treatment Plan Summary: Plan Discharge home and follow up with Miachel Roux    Continue current medications  Disposition: No evidence of imminent risk to self or others at present.  Patient does not meet criteria for psychiatric inpatient admission.  Earleen Newport, NP 07/09/2016 12:18  PM  Patient seen face to face for psychiatric evaluation. Chart reviewed and finding discussed with Physician extender. Agreed with disposition and treatment plan.   Berniece Andreas, MD

## 2016-07-09 NOTE — Progress Notes (Signed)
CSW spoke with patient at bedside regarding discharge and transportation. Patient reports no concerns returning home. Patient reports that she has no family or friends to transport her home and no other means to get home. CSW coordinated transportation and provided patient with taxi voucher.  Abundio Miu, [facility]  Clinical Social Worker [phone]

## 2016-07-09 NOTE — ED Notes (Signed)
Patient asked for pain medication for bilateral leg pain.  She says this is chronic.  Rates her pain an 8/10.

## 2016-07-09 NOTE — ED Provider Notes (Signed)
Pt reporting dystonia to legs Pt awake/alert Distal pulses intact, no edema/erythema Will give benadryl for dystonia    [name], MD 07/09/16 0222

## 2016-07-09 NOTE — Progress Notes (Signed)
CSW filed patient's notice of commitment change paperwork into IVC logbook.   Abundio Miu, [facility]  Clinical Social Worker [phone]

## 2016-07-09 NOTE — ED Notes (Signed)
Provider called and pain score reported

## 2016-07-09 NOTE — [facility] Suicide Risk Assessment (Cosign Needed)
Suicide Risk Assessment  Discharge Assessment   [facility] Discharge Suicide Risk Assessment   Principal Problem: Schizoaffective disorder, depressive type [facility]) Discharge Diagnoses:  Patient Active Problem List   Diagnosis Date Noted  . Schizoaffective disorder, bipolar type ([place]) [F25.0] 07/08/2016  . Delirium [R41.0] 09/05/2014  . Acute encephalopathy [G93.40] 09/05/2014  . Chronic pain syndrome [G89.4] 09/05/2014  . Diarrhea [R19.7] 02/14/2011  . Schizoaffective disorder, depressive type ([place]) [F25.1] 01/20/2011  . Seizure disorder ([place]) [Q22.297] 09/02/2010  . Fracture, humerus, head [S42.293A] 11/05/2009  . CIGARETTE SMOKER [F17.200] 09/05/2007  . CHRONIC AIRWAY OBSTRUCTION NEC [J44.9] 07/11/2007  . Hypothyroidism [E03.9] 06/08/2006  . Hyperlipemia [E78.5] 06/08/2006    Total Time spent with patient: 30 minutes  Musculoskeletal: Strength & Muscle Tone: within normal limits Gait & Station: normal Patient leans: N/A  Psychiatric Specialty Exam: Physical Exam  Nursing note and vitals reviewed. Constitutional: She is oriented to person, place, and time. She appears well-developed and well-nourished.  HENT:  Head: Normocephalic.  Neck: Normal range of motion.  Respiratory: Effort normal.  Musculoskeletal: Normal range of motion.  Neurological: She is alert and oriented to person, place, and time.  Psychiatric: Her speech is normal and behavior is normal. Thought content normal. Her mood appears anxious. Thought content is not paranoid. Cognition and memory are normal. She expresses impulsivity. She expresses no homicidal and no suicidal ideation.    Review of Systems  Psychiatric/Behavioral: Positive for depression. Negative for hallucinations.  All other systems reviewed and are negative.   Blood pressure (!) 148/73, pulse 68, temperature 98.8 F (37.1 C), temperature source Oral, resp. rate 16, SpO2 96 %.There is no height or weight on file to calculate BMI.  General  Appearance: Casual  Eye Contact:  Fair  Speech:  Normal Rate  Volume:  Normal  Mood:  Anxious  Affect:  Congruent  Thought Process:  Coherent and Descriptions of Associations: Tangential  Orientation:  Full (Time, Place, and Person)  Thought Content:  Logical  Suicidal Thoughts:  No  Homicidal Thoughts:  No  Memory:  Immediate;   Fair Recent;   Fair Remote;   Fair  Judgement:  Intact  Insight:  Present  Psychomotor Activity:  Normal  Concentration:  Concentration: Fair and Attention Span: Fair  Recall:  [employer] of Knowledge:  Fair  Language:  Good  Akathisia:  No  Handed:  Right  AIMS (if indicated):     Assets:  Leisure Time Physical Health Resilience  ADL's:  Intact  Cognition:  Impaired,  Mild  Sleep:        Mental Status Per Nursing Assessment::   On Admission:     Demographic Factors:  Age 66 or older, Caucasian and Living alone  Loss Factors: None  Historical Factors: Impulsivity  Risk Reduction Factors:   Sense of responsibility to family, Positive social support and Positive coping skills or problem solving skills  Continued Clinical Symptoms:  Previous Psychiatric Diagnoses and Treatments  Cognitive Features That Contribute To Risk:  None    Suicide Risk:  Minimal: No identifiable suicidal ideation.  Patients presenting with no risk factors but with morbid ruminations; may be classified as minimal risk based on the severity of the depressive symptoms  Follow-up Information    ROBERTS, Sharol Given, MD.   Specialty:  Internal Medicine Contact information: [address] [place] [address] [phone]           Plan Of Care/Follow-up recommendations:  Activity:  As tolerated Diet:  heart  healthy  Follow-up Information    ROBERTS, Sharol Given, MD.   Specialty:  Internal Medicine Contact information: [address] [place] [address] [phone]          Earleen Newport, NP 07/09/2016, 12:31 PM

## 2016-07-13 ENCOUNTER — Encounter (HOSPITAL_COMMUNITY): Payer: Self-pay

## 2016-07-13 ENCOUNTER — Emergency Department (HOSPITAL_COMMUNITY)
Admission: EM | Admit: 2016-07-13 | Discharge: 2016-07-14 | Disposition: A | Discharge status: Home / Self Care | Payer: Medicare Other | Attending: Emergency Medicine | Admitting: Emergency Medicine

## 2016-07-13 DIAGNOSIS — F251 Schizoaffective disorder, depressive type: Secondary | ICD-10-CM | POA: Insufficient documentation

## 2016-07-13 DIAGNOSIS — I252 Old myocardial infarction: Secondary | ICD-10-CM | POA: Insufficient documentation

## 2016-07-13 DIAGNOSIS — E039 Hypothyroidism, unspecified: Secondary | ICD-10-CM | POA: Insufficient documentation

## 2016-07-13 DIAGNOSIS — F17210 Nicotine dependence, cigarettes, uncomplicated: Secondary | ICD-10-CM | POA: Insufficient documentation

## 2016-07-13 DIAGNOSIS — Z79891 Long term (current) use of opiate analgesic: Secondary | ICD-10-CM | POA: Diagnosis not present

## 2016-07-13 DIAGNOSIS — Z79899 Other long term (current) drug therapy: Secondary | ICD-10-CM | POA: Diagnosis not present

## 2016-07-13 DIAGNOSIS — I2510 Atherosclerotic heart disease of native coronary artery without angina pectoris: Secondary | ICD-10-CM | POA: Insufficient documentation

## 2016-07-13 DIAGNOSIS — J449 Chronic obstructive pulmonary disease, unspecified: Secondary | ICD-10-CM | POA: Insufficient documentation

## 2016-07-13 DIAGNOSIS — Z7982 Long term (current) use of aspirin: Secondary | ICD-10-CM | POA: Insufficient documentation

## 2016-07-13 DIAGNOSIS — Z6379 Other stressful life events affecting family and household: Secondary | ICD-10-CM | POA: Diagnosis present

## 2016-07-13 LAB — COMPREHENSIVE METABOLIC PANEL
ALBUMIN: 4 g/dL (ref 3.5–5.0)
ALT: 19 U/L (ref 14–54)
AST: 22 U/L (ref 15–41)
Alkaline Phosphatase: 75 U/L (ref 38–126)
Anion gap: 8 (ref 5–15)
BUN: 14 mg/dL (ref 6–20)
CHLORIDE: 103 mmol/L (ref 101–111)
CO2: 26 mmol/L (ref 22–32)
CREATININE: 0.72 mg/dL (ref 0.44–1.00)
Calcium: 8.9 mg/dL (ref 8.9–10.3)
GFR calc Af Amer: >60 mL/min (ref >60)
GFR calc non Af Amer: >60 mL/min (ref >60)
GLUCOSE: 109 mg/dL — AB (ref 65–99)
POTASSIUM: 3.8 mmol/L (ref 3.5–5.1)
SODIUM: 137 mmol/L (ref 135–145)
Total Bilirubin: 0.4 mg/dL (ref 0.3–1.2)
Total Protein: 7.2 g/dL (ref 6.5–8.1)

## 2016-07-13 LAB — CBC
HEMATOCRIT: 42.5 % (ref 36.0–46.0)
HEMOGLOBIN: 13.8 g/dL (ref 12.0–15.0)
MCH: 30.6 pg (ref 26.0–34.0)
MCHC: 32.5 g/dL (ref 30.0–36.0)
MCV: 94.2 fL (ref 78.0–100.0)
PLATELETS: 302 10*3/uL (ref 150–400)
RBC: 4.51 MIL/uL (ref 3.87–5.11)
RDW: 13.9 % (ref 11.5–15.5)
WBC: 12.3 10*3/uL — AB (ref 4.0–10.5)

## 2016-07-13 LAB — SALICYLATE LEVEL: Salicylate Lvl: <7 mg/dL (ref 2.8–30.0)

## 2016-07-13 LAB — ETHANOL

## 2016-07-13 LAB — ACETAMINOPHEN LEVEL: Acetaminophen (Tylenol), Serum: <10 ug/mL — ABNORMAL LOW (ref 10–30)

## 2016-07-13 NOTE — ED Notes (Signed)
TTS at bedside. 

## 2016-07-13 NOTE — ED Notes (Signed)
Provider at bedside

## 2016-07-13 NOTE — ED Notes (Signed)
Care transferred to [place] [place] for SAPU 37. Pt was wanded by security and ambulated to SAPU 37.
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
## 2016-07-13 NOTE — ED Notes (Signed)
Pt presents with depression.  Pt found naked outside.  History of Schizoaffective, Bipolar and Seizure DO.  Awake, alert & responsive, no distress noted, pt not forthcoming with information.  Monitoring for safety, Q 15 min checks in effect.  Safety check for contraband completed, no items found.

## 2016-07-13 NOTE — ED Notes (Signed)
Pt belongings consisted of medicine one pair of black floral pants with a gray long sleeved shirt. When asked if she could sign her belongings sheet, she stated "I don't know what you're talking about, I don't remember. Just give me blankets!"
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
## 2016-07-14 DIAGNOSIS — F17210 Nicotine dependence, cigarettes, uncomplicated: Secondary | ICD-10-CM

## 2016-07-14 DIAGNOSIS — Z79891 Long term (current) use of opiate analgesic: Secondary | ICD-10-CM

## 2016-07-14 DIAGNOSIS — Z79899 Other long term (current) drug therapy: Secondary | ICD-10-CM

## 2016-07-14 DIAGNOSIS — F251 Schizoaffective disorder, depressive type: Secondary | ICD-10-CM

## 2016-07-14 LAB — URINALYSIS, ROUTINE W REFLEX MICROSCOPIC
BILIRUBIN URINE: NEGATIVE
Glucose, UA: NEGATIVE mg/dL
Hgb urine dipstick: NEGATIVE
KETONES UR: 5 mg/dL — AB
Nitrite: NEGATIVE
PH: 5 (ref 5.0–8.0)
PROTEIN: NEGATIVE mg/dL
Specific Gravity, Urine: 1.032 — ABNORMAL HIGH (ref 1.005–1.030)

## 2016-07-14 LAB — RAPID URINE DRUG SCREEN, HOSP PERFORMED
Amphetamines: NOT DETECTED
BARBITURATES: NOT DETECTED
Benzodiazepines: POSITIVE — AB
Cocaine: NOT DETECTED
Opiates: POSITIVE — AB
TETRAHYDROCANNABINOL: NOT DETECTED

## 2016-07-14 MED ORDER — ALBUTEROL SULFATE HFA 108 (90 BASE) MCG/ACT IN AERS
1.0000 | INHALATION_SPRAY | Freq: Three times a day (TID) | RESPIRATORY_TRACT | Status: DC | PRN
Start: 2016-07-14 — End: 2016-07-14

## 2016-07-14 MED ORDER — PANTOPRAZOLE SODIUM 40 MG PO TBEC
40.0000 mg | DELAYED_RELEASE_TABLET | Freq: Every day | ORAL | Status: DC
  Administered 2016-07-14: 40 mg via ORAL
  Filled 2016-07-14: qty 1

## 2016-07-14 MED ORDER — TEMAZEPAM 15 MG PO CAPS
15.0000 mg | ORAL_CAPSULE | Freq: Every evening | ORAL | Status: DC | PRN
  Administered 2016-07-14: 15 mg via ORAL
  Filled 2016-07-14: qty 1

## 2016-07-14 MED ORDER — ASPIRIN EC 81 MG PO TBEC
81.0000 mg | DELAYED_RELEASE_TABLET | Freq: Every day | ORAL | Status: DC
  Administered 2016-07-14: 81 mg via ORAL
  Filled 2016-07-14: qty 1

## 2016-07-14 MED ORDER — ARIPIPRAZOLE 10 MG PO TABS
20.0000 mg | ORAL_TABLET | Freq: Every day | ORAL | Status: DC
  Administered 2016-07-14: 20 mg via ORAL
  Filled 2016-07-14: qty 2

## 2016-07-14 MED ORDER — DARIFENACIN HYDROBROMIDE ER 7.5 MG PO TB24
7.5000 mg | ORAL_TABLET | Freq: Every day | ORAL | Status: DC
  Administered 2016-07-14: 7.5 mg via ORAL
  Filled 2016-07-14: qty 1

## 2016-07-14 MED ORDER — OXYCODONE HCL 5 MG PO TABS
10.0000 mg | ORAL_TABLET | Freq: Four times a day (QID) | ORAL | Status: DC
  Administered 2016-07-14: 10 mg via ORAL
  Filled 2016-07-14: qty 2

## 2016-07-14 MED ORDER — DOCUSATE SODIUM 100 MG PO CAPS: 100.0000 mg | ORAL_CAPSULE | Freq: Every day | ORAL | Status: DC | PRN

## 2016-07-14 MED ORDER — LEVOTHYROXINE SODIUM 88 MCG PO TABS
88.0000 ug | ORAL_TABLET | Freq: Every day | ORAL | Status: DC
  Administered 2016-07-14: 88 ug via ORAL
  Filled 2016-07-14: qty 1

## 2016-07-14 MED ORDER — CEPHALEXIN 500 MG PO CAPS
500.0000 mg | ORAL_CAPSULE | Freq: Two times a day (BID) | ORAL | Status: DC
  Administered 2016-07-14: 500 mg via ORAL
  Filled 2016-07-14: qty 1

## 2016-07-14 MED ORDER — GABAPENTIN 400 MG PO CAPS
400.0000 mg | ORAL_CAPSULE | Freq: Three times a day (TID) | ORAL | Status: DC
  Administered 2016-07-14 (×2): 400 mg via ORAL
  Filled 2016-07-14 (×2): qty 1

## 2016-07-14 MED ORDER — SIMVASTATIN 40 MG PO TABS: 40.0000 mg | ORAL_TABLET | Freq: Every day | ORAL | Status: DC

## 2016-07-14 NOTE — ED Notes (Signed)
D:Pt c/o anxiety and has been asking about medications. Pt reports that she is allergic to keflex. She is rapid in speech speaking from one subject to another. A:Offered support, encouragement and 15 minute checks. R:Safety maintained on the unit.

## 2016-07-14 NOTE — ED Notes (Signed)
Pt d/c with health aide. Pt refused to sign discharge and would not leave until aide arrived.

## 2016-07-14 NOTE — [facility]HH Suicide Risk Assessment (Signed)
Suicide Risk Assessment  Discharge Assessment   [facility] Discharge Suicide Risk Assessment   Principal Problem: Schizoaffective disorder, depressive type [facility]) Discharge Diagnoses:  Patient Active Problem List   Diagnosis Date Noted  . Schizoaffective disorder, depressive type ([place]) [F25.1] 01/20/2011    Priority: High  . Schizoaffective disorder, bipolar type ([place]) [F25.0] 07/08/2016  . Delirium [R41.0] 09/05/2014  . Acute encephalopathy [G93.40] 09/05/2014  . Chronic pain syndrome [G89.4] 09/05/2014  . Diarrhea [R19.7] 02/14/2011  . Seizure disorder ([place]) [O24.235] 09/02/2010  . Fracture, humerus, head [S42.293A] 11/05/2009  . CIGARETTE SMOKER [F17.200] 09/05/2007  . CHRONIC AIRWAY OBSTRUCTION NEC [J44.9] 07/11/2007  . Hypothyroidism [E03.9] 06/08/2006  . Hyperlipemia [E78.5] 06/08/2006    Total Time spent with patient: 45 minutes  Musculoskeletal: Strength & Muscle Tone: within normal limits Gait & Station: normal Patient leans: N/A  Psychiatric Specialty Exam: Physical Exam  Constitutional: She is oriented to person, place, and time. She appears well-developed and well-nourished.  HENT:  Head: Normocephalic.  Neck: Normal range of motion.  Respiratory: Effort normal.  Musculoskeletal: Normal range of motion.  Neurological: She is alert and oriented to person, place, and time.  Psychiatric: She has a normal mood and affect. Her speech is normal and behavior is normal. Judgment and thought content normal. Cognition and memory are normal.    Review of Systems  All other systems reviewed and are negative.   Blood pressure (!) 137/59, pulse 62, temperature 98.1 F (36.7 C), resp. rate 19, height 5' (1.524 m), weight 80.7 kg (178 lb), SpO2 98 %.Body mass index is 34.76 kg/m.  General Appearance: Casual  Eye Contact:  Good  Speech:  Normal Rate  Volume:  Normal  Mood:  Irritable  Affect:  Congruent  Thought Process:  Coherent and Descriptions of Associations: Intact   Orientation:  Full (Time, Place, and Person)  Thought Content:  WDL and Logical  Suicidal Thoughts:  No  Homicidal Thoughts:  No  Memory:  Immediate;   Good Recent;   Good Remote;   Good  Judgement:  Fair  Insight:  Fair  Psychomotor Activity:  Normal  Concentration:  Concentration: Good and Attention Span: Good  Recall:  Good  Fund of Knowledge:  Fair  Language:  Good  Akathisia:  No  Handed:  Right  AIMS (if indicated):     Assets:  Leisure Time Physical Health Resilience  ADL's:  Intact  Cognition:  WNL  Sleep:       Mental Status Per Nursing Assessment::   On Admission:   depression   Demographic Factors:  Age 10 or older and Caucasian  Loss Factors: NA  Historical Factors: NA  Risk Reduction Factors:   Sense of responsibility to family, Positive social support and Positive therapeutic relationship  Continued Clinical Symptoms:  None  Cognitive Features That Contribute To Risk:  None    Suicide Risk:  Minimal: No identifiable suicidal ideation.  Patients presenting with no risk factors but with morbid ruminations; may be classified as minimal risk based on the severity of the depressive symptoms    Plan Of Care/Follow-up recommendations:  Activity:  as tolerated Diet:  heart healthy diet  LORD, JAMISON, NP 07/14/2016, 10:19 AM
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
## 2016-07-14 NOTE — Consult Note (Signed)
[facility] Psychiatry Consult   Reason for Consult:  Walking naked, upset about her house Referring Physician:  EDP Patient Identification: Dominique Acosta MRN:  [id number] Principal Diagnosis: Schizoaffective disorder, depressive type [facility]) Diagnosis:   Patient Active Problem List   Diagnosis Date Noted  . Schizoaffective disorder, depressive type ([place]) [F25.1] 01/20/2011    Priority: High  . Schizoaffective disorder, bipolar type ([place]) [F25.0] 07/08/2016  . Delirium [R41.0] 09/05/2014  . Acute encephalopathy [G93.40] 09/05/2014  . Chronic pain syndrome [G89.4] 09/05/2014  . Diarrhea [R19.7] 02/14/2011  . Seizure disorder ([place]) [H82.993] 09/02/2010  . Fracture, humerus, head [S42.293A] 11/05/2009  . CIGARETTE SMOKER [F17.200] 09/05/2007  . CHRONIC AIRWAY OBSTRUCTION NEC [J44.9] 07/11/2007  . Hypothyroidism [E03.9] 06/08/2006  . Hyperlipemia [E78.5] 06/08/2006    Total Time spent with patient: 45 minutes  Subjective:   Dominique Acosta is a 66 y.o. female patient does not warrant admission.  HPI:  66 yo female who presented to the ED with depression over her living situation.  On assessment, she quickly reports she wants the social worker to come home with her and see her deplorable place of living.  No suicidal/homicidal ideations, hallucinations, or alcohol/drug abuse.  Recently at a facility, within the month. Social worker consulted and met with her.  Past Psychiatric History: depression, schizoaffective disorder  Risk to Self: Suicidal Ideation: No Suicidal Intent: No Is patient at risk for suicide?: No Suicidal Plan?: No Access to Means: No What has been your use of drugs/alcohol within the last 12 months?: None How many times?: 0 Other Self Harm Risks: UTA Triggers for Past Attempts: Unknown Intentional Self Injurious Behavior: None Risk to Others: Homicidal Ideation: No Thoughts of Harm to Others: No Current Homicidal Intent: No Current Homicidal Plan: No Access to  Homicidal Means: No Identified Victim: NA History of harm to others?: No Assessment of Violence: None Noted Does patient have access to weapons?: No Criminal Charges Pending?: No Does patient have a court date: No Prior Inpatient Therapy: Prior Inpatient Therapy: No Prior Therapy Dates: None Noted Prior Therapy Facilty/Provider(s): [facility] Reason for Treatment: MH issues Prior Outpatient Therapy: Prior Outpatient Therapy: No Prior Therapy Dates: Unknown Prior Therapy Facilty/Provider(s): [facility] Reason for Treatment: UTA Does patient have an ACCT team?: No Does patient have Intensive In-House Services?  : No Does patient have [facility] services? : No Does patient have P4CC services?: No  Past Medical History:  Past Medical History:  Diagnosis Date  . Arm pain   . Chronic pain   . COPD (chronic obstructive pulmonary disease) ([place])   . Coronary artery disease   . Depression   . Dystonia   . Epilepsy ([place])   . High cholesterol   . Humerus fracture   . Hypothyroid   . Leukemia ([place])   . Myocardial infarct   . Schizophrenia ([place])   . Seizure [facility])     Past Surgical History:  Procedure Laterality Date  . EXTERNAL EAR SURGERY    . HUMERUS FRACTURE SURGERY    . SHOULDER SURGERY     Family History: History reviewed. No pertinent family history. Family Psychiatric  History: none Social History:  History  Alcohol Use No     History  Drug Use No    Social History   Social History  . Marital status: Single    Spouse name: N/A  . Number of children: N/A  . Years of education: N/A   Social History Main Topics  . Smoking status: Current Every Day Smoker  Packs/day: 2.00    Years: 41.00    Types: Cigarettes  . Smokeless tobacco: Never Used  . Alcohol use No  . Drug use: No  . Sexual activity: No   Other Topics Concern  . None   Social History Narrative  . None   Additional Social History:    Allergies:   Allergies  Allergen Reactions  . Amoxicillin  Anaphylaxis  . Penicillins Anaphylaxis    Has patient had a PCN reaction causing immediate rash, facial/tongue/throat swelling, SOB or lightheadedness with hypotension: yes Has patient had a PCN reaction causing severe rash involving mucus membranes or skin necrosis: no Has patient had a PCN reaction that required hospitalization: yes Has patient had a PCN reaction occurring within the last 10 years: no If all of the above answers are "NO", then may proceed with Cephalosporin use.   Marland Kitchen Phenytoin Other (See Comments)    Other Reaction: CNS Disorder  . Dilaudid [Hydromorphone Hcl] Other (See Comments)    Psychosis (per patient)  . Haldol [Haloperidol]     Psychosis per caregiver  . Morphine And Related Other (See Comments)    psychosis    Labs:  Results for orders placed or performed during the hospital encounter of 07/13/16 (from the past 48 hour(s))  Comprehensive metabolic panel     Status: Abnormal   Collection Time: 07/13/16  8:03 PM  Result Value Ref Range   Sodium 137 135 - 145 mmol/L   Potassium 3.8 3.5 - 5.1 mmol/L   Chloride 103 101 - 111 mmol/L   CO2 26 22 - 32 mmol/L   Glucose, Bld 109 (H) 65 - 99 mg/dL   BUN 14 6 - 20 mg/dL   Creatinine, Ser 0.72 0.44 - 1.00 mg/dL   Calcium 8.9 8.9 - 10.3 mg/dL   Total Protein 7.2 6.5 - 8.1 g/dL   Albumin 4.0 3.5 - 5.0 g/dL   AST 22 15 - 41 U/L   ALT 19 14 - 54 U/L   Alkaline Phosphatase 75 38 - 126 U/L   Total Bilirubin 0.4 0.3 - 1.2 mg/dL   GFR calc non Af Amer >60 >60 mL/min   GFR calc Af Amer >60 >60 mL/min    Comment: (NOTE) The eGFR has been calculated using the CKD EPI equation. This calculation has not been validated in all clinical situations. eGFR's persistently <60 mL/min signify possible Chronic Kidney Disease.    Anion gap 8 5 - 15  Ethanol     Status: None   Collection Time: 07/13/16  8:03 PM  Result Value Ref Range   Alcohol, Ethyl (B) <5 <5 mg/dL    Comment:        LOWEST DETECTABLE LIMIT FOR SERUM ALCOHOL  IS 5 mg/dL FOR MEDICAL PURPOSES ONLY   Salicylate level     Status: None   Collection Time: 07/13/16  8:03 PM  Result Value Ref Range   Salicylate Lvl <8.9 2.8 - 30.0 mg/dL  Acetaminophen level     Status: Abnormal   Collection Time: 07/13/16  8:03 PM  Result Value Ref Range   Acetaminophen (Tylenol), Serum <10 (L) 10 - 30 ug/mL    Comment:        THERAPEUTIC CONCENTRATIONS VARY SIGNIFICANTLY. A RANGE OF 10-30 ug/mL MAY BE AN EFFECTIVE CONCENTRATION FOR MANY PATIENTS. HOWEVER, SOME ARE BEST TREATED AT CONCENTRATIONS OUTSIDE THIS RANGE. ACETAMINOPHEN CONCENTRATIONS >150 ug/mL AT 4 HOURS AFTER INGESTION AND >50 ug/mL AT 12 HOURS AFTER INGESTION ARE OFTEN ASSOCIATED WITH  TOXIC REACTIONS.   cbc     Status: Abnormal   Collection Time: 07/13/16  8:03 PM  Result Value Ref Range   WBC 12.3 (H) 4.0 - 10.5 K/uL   RBC 4.51 3.87 - 5.11 MIL/uL   Hemoglobin 13.8 12.0 - 15.0 g/dL   HCT 42.5 36.0 - 46.0 %   MCV 94.2 78.0 - 100.0 fL   MCH 30.6 26.0 - 34.0 pg   MCHC 32.5 30.0 - 36.0 g/dL   RDW 13.9 11.5 - 15.5 %   Platelets 302 150 - 400 K/uL  Rapid urine drug screen (hospital performed)     Status: Abnormal   Collection Time: 07/14/16  9:05 AM  Result Value Ref Range   Opiates POSITIVE (A) NONE DETECTED   Cocaine NONE DETECTED NONE DETECTED   Benzodiazepines POSITIVE (A) NONE DETECTED   Amphetamines NONE DETECTED NONE DETECTED   Tetrahydrocannabinol NONE DETECTED NONE DETECTED   Barbiturates NONE DETECTED NONE DETECTED    Comment:        DRUG SCREEN FOR MEDICAL PURPOSES ONLY.  IF CONFIRMATION IS NEEDED FOR ANY PURPOSE, NOTIFY LAB WITHIN 5 DAYS.        LOWEST DETECTABLE LIMITS FOR URINE DRUG SCREEN Drug Class       Cutoff (ng/mL) Amphetamine      1000 Barbiturate      200 Benzodiazepine   532 Tricyclics       992 Opiates          300 Cocaine          300 THC              50   Urinalysis, Routine w reflex microscopic     Status: Abnormal   Collection Time: 07/14/16  9:05  AM  Result Value Ref Range   Color, Urine YELLOW YELLOW   APPearance HAZY (A) CLEAR   Specific Gravity, Urine 1.032 (H) 1.005 - 1.030   pH 5.0 5.0 - 8.0   Glucose, UA NEGATIVE NEGATIVE mg/dL   Hgb urine dipstick NEGATIVE NEGATIVE   Bilirubin Urine NEGATIVE NEGATIVE   Ketones, ur 5 (A) NEGATIVE mg/dL   Protein, ur NEGATIVE NEGATIVE mg/dL   Nitrite NEGATIVE NEGATIVE   Leukocytes, UA LARGE (A) NEGATIVE   RBC / HPF 6-30 0 - 5 RBC/hpf   WBC, UA 6-30 0 - 5 WBC/hpf   Bacteria, UA RARE (A) NONE SEEN   Squamous Epithelial / LPF 0-5 (A) NONE SEEN   Mucous PRESENT     Current Facility-Administered Medications  Medication Dose Route Frequency Provider Last Rate Last Dose  . albuterol (PROVENTIL HFA;VENTOLIN HFA) 108 (90 Base) MCG/ACT inhaler 1-2 puff  1-2 puff Inhalation TID PRN Quintella Reichert, MD      . ARIPiprazole (ABILIFY) tablet 20 mg  20 mg Oral Daily Quintella Reichert, MD   20 mg at 07/14/16 0925  . aspirin EC tablet 81 mg  81 mg Oral Daily Quintella Reichert, MD   81 mg at 07/14/16 0925  . cephALEXin (KEFLEX) capsule 500 mg  500 mg Oral Q12H Quintella Reichert, MD   500 mg at 07/14/16 0121  . darifenacin (ENABLEX) 24 hr tablet 7.5 mg  7.5 mg Oral Daily Quintella Reichert, MD   7.5 mg at 07/14/16 0926  . docusate sodium (COLACE) capsule 100 mg  100 mg Oral Daily PRN Quintella Reichert, MD      . gabapentin (NEURONTIN) capsule 400 mg  400 mg Oral TID WC Quintella Reichert, MD   400 mg  at 07/14/16 0817  . levothyroxine (SYNTHROID, LEVOTHROID) tablet 88 mcg  88 mcg Oral Daily Quintella Reichert, MD   88 mcg at 07/14/16 0817  . pantoprazole (PROTONIX) EC tablet 40 mg  40 mg Oral Daily Quintella Reichert, MD   40 mg at 07/14/16 0926  . simvastatin (ZOCOR) tablet 40 mg  40 mg Oral Daily Quintella Reichert, MD       Current Outpatient Prescriptions  Medication Sig Dispense Refill  . ABILIFY MAINTENA 400 MG PRSY Inject 400 mg as directed every 30 (thirty) days.    Marland Kitchen acetaminophen-codeine (TYLENOL #3) 300-30 MG tablet Take 1  tablet by mouth 4 (four) times daily.     Marland Kitchen acyclovir (ZOVIRAX) 200 MG capsule Take 200 mg by mouth 2 (two) times daily.    Marland Kitchen albuterol (PROVENTIL,VENTOLIN) 90 MCG/ACT inhaler Inhale 2 puffs into the lungs as directed. 1-2 puffs up to three times a day prn (Patient taking differently: Inhale 1-2 puffs into the lungs 3 (three) times daily as needed for wheezing or shortness of breath. 1-2 puffs up to three times a day prn) 17 g 5  . amantadine (SYMMETREL) 100 MG capsule Take 100 mg by mouth daily.    . ARIPiprazole (ABILIFY) 20 MG tablet Take 1 tablet (20 mg total) by mouth daily. 30 tablet 0  . baclofen (LIORESAL) 10 MG tablet Take 10 mg by mouth 3 (three) times daily.     . benztropine (COGENTIN) 2 MG tablet Take 1 tablet by mouth 3 (three) times daily.    . cephALEXin (KEFLEX) 500 MG capsule Take 1 capsule (500 mg total) by mouth every 12 (twelve) hours. 18 capsule 0  . darifenacin (ENABLEX) 7.5 MG 24 hr tablet Take 1 tablet (7.5 mg total) by mouth daily. 30 tablet 0  . diphenhydrAMINE (BENADRYL) 25 mg capsule Take 25 mg by mouth every 6 (six) hours as needed for itching, allergies or sleep.    Marland Kitchen gabapentin (NEURONTIN) 400 MG capsule Take 1 capsule (400 mg total) by mouth 3 (three) times daily with meals. 90 capsule 0  . levothyroxine (SYNTHROID, LEVOTHROID) 88 MCG tablet Take 88 mcg by mouth daily.     Marland Kitchen LORazepam (ATIVAN) 1 MG tablet Take 1 tablet by mouth 2 (two) times daily.     Marland Kitchen omeprazole (PRILOSEC) 20 MG capsule Take 20 mg by mouth daily.    . Oxycodone HCl 10 MG TABS Take 1 tablet by mouth 4 (four) times daily.     Marland Kitchen PROAIR HFA 108 (90 Base) MCG/ACT inhaler Inhale 2 puffs into the lungs every 6 (six) hours as needed for wheezing or shortness of breath.     . promethazine (PHENERGAN) 25 MG tablet Take 25 mg by mouth 2 (two) times daily as needed for nausea or vomiting.    . propranolol (INDERAL) 10 MG tablet Take 10 mg by mouth 2 (two) times daily.    . simvastatin (ZOCOR) 40 MG tablet  Take 40 mg by mouth daily.    . solifenacin (VESICARE) 10 MG tablet Take 10 mg by mouth daily.    . temazepam (RESTORIL) 15 MG capsule Take 1 capsule (15 mg total) by mouth at bedtime as needed for sleep. 30 capsule 0  . temazepam (RESTORIL) 30 MG capsule Take 30 mg by mouth at bedtime.    Marland Kitchen venlafaxine (EFFEXOR-XR) 150 MG 24 hr capsule Take 150 mg by mouth 2 (two) times daily. Reported on 10/27/2015      Musculoskeletal: Strength & Muscle Tone: within  normal limits Gait & Station: normal Patient leans: N/A  Psychiatric Specialty Exam: Physical Exam  Constitutional: She is oriented to person, place, and time. She appears well-developed and well-nourished.  HENT:  Head: Normocephalic.  Neck: Normal range of motion.  Respiratory: Effort normal.  Musculoskeletal: Normal range of motion.  Neurological: She is alert and oriented to person, place, and time.  Psychiatric: She has a normal mood and affect. Her speech is normal and behavior is normal. Judgment and thought content normal. Cognition and memory are normal.    Review of Systems  All other systems reviewed and are negative.   Blood pressure (!) 137/59, pulse 62, temperature 98.1 F (36.7 C), resp. rate 19, height 5' (1.524 m), weight 80.7 kg (178 lb), SpO2 98 %.Body mass index is 34.76 kg/m.  General Appearance: Casual  Eye Contact:  Good  Speech:  Normal Rate  Volume:  Normal  Mood:  Irritable  Affect:  Congruent  Thought Process:  Coherent and Descriptions of Associations: Intact  Orientation:  Full (Time, Place, and Person)  Thought Content:  WDL and Logical  Suicidal Thoughts:  No  Homicidal Thoughts:  No  Memory:  Immediate;   Good Recent;   Good Remote;   Good  Judgement:  Fair  Insight:  Fair  Psychomotor Activity:  Normal  Concentration:  Concentration: Good and Attention Span: Good  Recall:  Good  Fund of Knowledge:  Fair  Language:  Good  Akathisia:  No  Handed:  Right  AIMS (if indicated):     Assets:   Leisure Time Physical Health Resilience  ADL's:  Intact  Cognition:  WNL  Sleep:        Treatment Plan Summary: Daily contact with patient to assess and evaluate symptoms and progress in treatment, Medication management and Plan schizoaffective disorder, depressive type:  -Crisis stabilization -Medication management:  Medical medications along with Abilify 20 mg daily for depression. -Individual counseling  Disposition: No evidence of imminent risk to self or others at present.    Waylan Boga, NP 07/14/2016 10:17 AM  Patient seen face-to-face for psychiatric evaluation, chart reviewed and case discussed with the physician extender and developed treatment plan. Reviewed the information documented and agree with the treatment plan. Corena Pilgrim, MD

## 2016-07-28 ENCOUNTER — Emergency Department (HOSPITAL_COMMUNITY): Payer: Medicare Other

## 2016-07-28 ENCOUNTER — Encounter (HOSPITAL_COMMUNITY): Payer: Self-pay | Admitting: *Deleted

## 2016-07-28 ENCOUNTER — Emergency Department (HOSPITAL_COMMUNITY)
Admission: EM | Admit: 2016-07-28 | Discharge: 2016-07-29 | Disposition: A | Discharge status: Home / Self Care | Payer: Medicare Other | Attending: Emergency Medicine | Admitting: Emergency Medicine

## 2016-07-28 DIAGNOSIS — J449 Chronic obstructive pulmonary disease, unspecified: Secondary | ICD-10-CM | POA: Insufficient documentation

## 2016-07-28 DIAGNOSIS — I252 Old myocardial infarction: Secondary | ICD-10-CM | POA: Diagnosis not present

## 2016-07-28 DIAGNOSIS — I2510 Atherosclerotic heart disease of native coronary artery without angina pectoris: Secondary | ICD-10-CM | POA: Insufficient documentation

## 2016-07-28 DIAGNOSIS — E039 Hypothyroidism, unspecified: Secondary | ICD-10-CM | POA: Diagnosis present

## 2016-07-28 DIAGNOSIS — F209 Schizophrenia, unspecified: Secondary | ICD-10-CM | POA: Diagnosis not present

## 2016-07-28 DIAGNOSIS — F17210 Nicotine dependence, cigarettes, uncomplicated: Secondary | ICD-10-CM | POA: Insufficient documentation

## 2016-07-28 DIAGNOSIS — E038 Other specified hypothyroidism: Secondary | ICD-10-CM | POA: Diagnosis not present

## 2016-07-28 DIAGNOSIS — R45851 Suicidal ideations: Secondary | ICD-10-CM | POA: Diagnosis present

## 2016-07-28 DIAGNOSIS — R4182 Altered mental status, unspecified: Secondary | ICD-10-CM | POA: Insufficient documentation

## 2016-07-28 DIAGNOSIS — F250 Schizoaffective disorder, bipolar type: Secondary | ICD-10-CM | POA: Diagnosis present

## 2016-07-28 DIAGNOSIS — J40 Bronchitis, not specified as acute or chronic: Secondary | ICD-10-CM | POA: Diagnosis not present

## 2016-07-28 LAB — CBC WITH DIFFERENTIAL/PLATELET
BASOS ABS: 0.1 10*3/uL (ref 0.0–0.1)
BASOS PCT: 0 %
BASOS PCT: 0 %
Basophils Absolute: 0.1 10*3/uL (ref 0.0–0.1)
EOS ABS: 0 10*3/uL (ref 0.0–0.7)
EOS ABS: 0.2 10*3/uL (ref 0.0–0.7)
Eosinophils Relative: 0 %
Eosinophils Relative: 1 %
HCT: 41 % (ref 36.0–46.0)
HEMATOCRIT: 39.5 % (ref 36.0–46.0)
HEMOGLOBIN: 13.5 g/dL (ref 12.0–15.0)
Hemoglobin: 13.1 g/dL (ref 12.0–15.0)
Lymphocytes Relative: 9 %
Lymphocytes Relative: 18 %
Lymphs Abs: 2.2 10*3/uL (ref 0.7–4.0)
Lymphs Abs: 3.3 10*3/uL (ref 0.7–4.0)
MCH: 30.6 pg (ref 26.0–34.0)
MCH: 30.8 pg (ref 26.0–34.0)
MCHC: 32.9 g/dL (ref 30.0–36.0)
MCHC: 33.2 g/dL (ref 30.0–36.0)
MCV: 93 fL (ref 78.0–100.0)
MCV: 92.9 fL (ref 78.0–100.0)
MONOS PCT: 6 %
Monocytes Absolute: 1.4 10*3/uL — ABNORMAL HIGH (ref 0.1–1.0)
Monocytes Absolute: 0.8 10*3/uL (ref 0.1–1.0)
Monocytes Relative: 5 %
NEUTROS ABS: 14.1 10*3/uL — AB (ref 1.7–7.7)
NEUTROS PCT: 85 %
Neutro Abs: 20.8 10*3/uL — ABNORMAL HIGH (ref 1.7–7.7)
Neutrophils Relative %: 76 %
PLATELETS: 333 10*3/uL (ref 150–400)
Platelets: 343 10*3/uL (ref 150–400)
RBC: 4.41 MIL/uL (ref 3.87–5.11)
RBC: 4.25 MIL/uL (ref 3.87–5.11)
RDW: 13.9 % (ref 11.5–15.5)
RDW: 14 % (ref 11.5–15.5)
WBC: 24.3 10*3/uL — AB (ref 4.0–10.5)
WBC: 18.4 10*3/uL — ABNORMAL HIGH (ref 4.0–10.5)

## 2016-07-28 LAB — COMPREHENSIVE METABOLIC PANEL
ALBUMIN: 4.4 g/dL (ref 3.5–5.0)
ALK PHOS: 88 U/L (ref 38–126)
ALT: 37 U/L (ref 14–54)
AST: 72 U/L — ABNORMAL HIGH (ref 15–41)
Anion gap: 13 (ref 5–15)
BUN: 15 mg/dL (ref 6–20)
CALCIUM: 8.9 mg/dL (ref 8.9–10.3)
CHLORIDE: 103 mmol/L (ref 101–111)
CO2: 22 mmol/L (ref 22–32)
CREATININE: 0.84 mg/dL (ref 0.44–1.00)
GFR calc Af Amer: >60 mL/min (ref >60)
GFR calc non Af Amer: >60 mL/min (ref >60)
GLUCOSE: 91 mg/dL (ref 65–99)
Potassium: 2.8 mmol/L — ABNORMAL LOW (ref 3.5–5.1)
SODIUM: 138 mmol/L (ref 135–145)
Total Bilirubin: 0.9 mg/dL (ref 0.3–1.2)
Total Protein: 8.1 g/dL (ref 6.5–8.1)

## 2016-07-28 LAB — URINALYSIS, ROUTINE W REFLEX MICROSCOPIC
BACTERIA UA: NONE SEEN
Bilirubin Urine: NEGATIVE
GLUCOSE, UA: NEGATIVE mg/dL
Hgb urine dipstick: NEGATIVE
KETONES UR: 20 mg/dL — AB
Leukocytes, UA: NEGATIVE
NITRITE: NEGATIVE
PROTEIN: 30 mg/dL — AB
Specific Gravity, Urine: 1.032 — ABNORMAL HIGH (ref 1.005–1.030)
pH: 5 (ref 5.0–8.0)

## 2016-07-28 LAB — RAPID URINE DRUG SCREEN, HOSP PERFORMED
Amphetamines: NOT DETECTED
BENZODIAZEPINES: POSITIVE — AB
Barbiturates: NOT DETECTED
Cocaine: NOT DETECTED
OPIATES: POSITIVE — AB
Tetrahydrocannabinol: NOT DETECTED

## 2016-07-28 LAB — SALICYLATE LEVEL

## 2016-07-28 LAB — ETHANOL: Alcohol, Ethyl (B): <5 mg/dL (ref <5)

## 2016-07-28 LAB — ACETAMINOPHEN LEVEL

## 2016-07-28 LAB — MAGNESIUM: MAGNESIUM: 2.4 mg/dL (ref 1.7–2.4)

## 2016-07-28 MED ORDER — LORAZEPAM 2 MG/ML IJ SOLN
2.0000 mg | Freq: Once | INTRAMUSCULAR | Status: AC
Start: 2016-07-28 — End: 2016-07-28
  Administered 2016-07-28: 2 mg via INTRAMUSCULAR
  Filled 2016-07-28: qty 1

## 2016-07-28 MED ORDER — ZIPRASIDONE MESYLATE 20 MG IM SOLR
10.0000 mg | Freq: Once | INTRAMUSCULAR | Status: AC
  Administered 2016-07-28: 10 mg via INTRAMUSCULAR
  Filled 2016-07-28: qty 20

## 2016-07-28 MED ORDER — LORAZEPAM 2 MG/ML IJ SOLN
2.0000 mg | Freq: Once | INTRAMUSCULAR | Status: AC
  Administered 2016-07-28: 2 mg via INTRAMUSCULAR
  Filled 2016-07-28: qty 1

## 2016-07-28 MED ORDER — POTASSIUM CHLORIDE CRYS ER 20 MEQ PO TBCR
80.0000 meq | EXTENDED_RELEASE_TABLET | Freq: Once | ORAL | Status: AC
  Administered 2016-07-28: 80 meq via ORAL
  Filled 2016-07-28: qty 4

## 2016-07-28 MED ORDER — STERILE WATER FOR INJECTION IJ SOLN
INTRAMUSCULAR | Status: AC
  Administered 2016-07-28: 1 mL
  Filled 2016-07-28: qty 10

## 2016-07-28 NOTE — ED Notes (Signed)
Blood draw attempt unsuccessful. RN notified. 

## 2016-07-28 NOTE — ED Provider Notes (Signed)
17: 40-checkup, to evaluate for UTI, pneumonia, and likely admit for delirium.  Urinalysis has not been obtained yet.  Urinalysis obtained at 1850, consistent with mild dehydration, but no infection.  Chest x-ray obtained at 2026, consistent with possible left lower lobe pneumonia.  21: 00-nursing has reported loose mucoid stool, and rash on perineum.  I evaluated the patient's perineum she has mild inflammation of the intergluteal tissue, and during the exam she expelled a small amount of brown stool, somewhat gelatinous in appearance.  Doubt cellulitis of the buttock.  No associated induration, bleeding or drainage.  Attempted to repeat CBC, but patient refused after her first stick was unable to obtain blood sample.  Medications  LORazepam (ATIVAN) injection 2 mg (2 mg Intramuscular Given 07/28/16 1231)  potassium chloride SA (K-DUR,KLOR-CON) CR tablet 80 mEq (80 mEq Oral Given 07/28/16 1635)  LORazepam (ATIVAN) injection 2 mg (2 mg Intramuscular Given 07/28/16 1638)  ziprasidone (GEODON) injection 10 mg (10 mg Intramuscular Given 07/28/16 1812)  sterile water (preservative free) injection (1 mL  Given 07/28/16 1822)    Patient Vitals for the past 24 hrs:  BP Temp Temp src Pulse Resp SpO2  07/28/16 1817 (!) 100/47 99.2 F (37.3 C) Oral 68 16 95 %  07/28/16 1104 129/77 98.4 F (36.9 C) Oral 78 16 95 %    Results for orders placed or performed during the hospital encounter of 07/28/16  CBC with Differential  Result Value Ref Range   WBC 24.3 (H) 4.0 - 10.5 K/uL   RBC 4.41 3.87 - 5.11 MIL/uL   Hemoglobin 13.5 12.0 - 15.0 g/dL   HCT 41.0 36.0 - 46.0 %   MCV 93.0 78.0 - 100.0 fL   MCH 30.6 26.0 - 34.0 pg   MCHC 32.9 30.0 - 36.0 g/dL   RDW 13.9 11.5 - 15.5 %   Platelets 343 150 - 400 K/uL   Neutrophils Relative % 85 %   Neutro Abs 20.8 (H) 1.7 - 7.7 K/uL   Lymphocytes Relative 9 %   Lymphs Abs 2.2 0.7 - 4.0 K/uL   Monocytes Relative 6 %   Monocytes Absolute 1.4 (H) 0.1 - 1.0 K/uL   Eosinophils Relative 0 %   Eosinophils Absolute 0.0 0.0 - 0.7 K/uL   Basophils Relative 0 %   Basophils Absolute 0.1 0.0 - 0.1 K/uL  Comprehensive metabolic panel  Result Value Ref Range   Sodium 138 135 - 145 mmol/L   Potassium 2.8 (L) 3.5 - 5.1 mmol/L   Chloride 103 101 - 111 mmol/L   CO2 22 22 - 32 mmol/L   Glucose, Bld 91 65 - 99 mg/dL   BUN 15 6 - 20 mg/dL   Creatinine, Ser 0.84 0.44 - 1.00 mg/dL   Calcium 8.9 8.9 - 10.3 mg/dL   Total Protein 8.1 6.5 - 8.1 g/dL   Albumin 4.4 3.5 - 5.0 g/dL   AST 72 (H) 15 - 41 U/L   ALT 37 14 - 54 U/L   Alkaline Phosphatase 88 38 - 126 U/L   Total Bilirubin 0.9 0.3 - 1.2 mg/dL   GFR calc non Af Amer >60 >60 mL/min   GFR calc Af Amer >60 >60 mL/min   Anion gap 13 5 - 15  Urinalysis, Routine w reflex microscopic  Result Value Ref Range   Color, Urine YELLOW YELLOW   APPearance HAZY (A) CLEAR   Specific Gravity, Urine 1.032 (H) 1.005 - 1.030   pH 5.0 5.0 - 8.0   Glucose,  UA NEGATIVE NEGATIVE mg/dL   Hgb urine dipstick NEGATIVE NEGATIVE   Bilirubin Urine NEGATIVE NEGATIVE   Ketones, ur 20 (A) NEGATIVE mg/dL   Protein, ur 30 (A) NEGATIVE mg/dL   Nitrite NEGATIVE NEGATIVE   Leukocytes, UA NEGATIVE NEGATIVE   RBC / HPF 0-5 0 - 5 RBC/hpf   WBC, UA 0-5 0 - 5 WBC/hpf   Bacteria, UA NONE SEEN NONE SEEN   Squamous Epithelial / LPF 0-5 (A) NONE SEEN   Mucous PRESENT    Hyaline Casts, UA PRESENT   Rapid urine drug screen (hospital performed)  Result Value Ref Range   Opiates POSITIVE (A) NONE DETECTED   Cocaine NONE DETECTED NONE DETECTED   Benzodiazepines POSITIVE (A) NONE DETECTED   Amphetamines NONE DETECTED NONE DETECTED   Tetrahydrocannabinol NONE DETECTED NONE DETECTED   Barbiturates NONE DETECTED NONE DETECTED  Acetaminophen level  Result Value Ref Range   Acetaminophen (Tylenol), Serum <10 (L) 10 - 30 ug/mL  Salicylate level  Result Value Ref Range   Salicylate Lvl <7.8 2.8 - 30.0 mg/dL  Ethanol  Result Value Ref Range    Alcohol, Ethyl (B) <5 <5 mg/dL  Magnesium  Result Value Ref Range   Magnesium 2.4 1.7 - 2.4 mg/dL    12:40 AM Reevaluation with update and discussion. After initial assessment and treatment, an updated evaluation reveals she is resting comfortably has no further complaints. Glynn Yepes L      MDM-nonspecific findings on medical screening exam for evaluation of causes of abnormal behavior.  Elevated white blood cell count with no clear signs for serious bacterial infection.  Chest x-ray abnormal patient is not reporting cough.  Frequent stooling in the emergency department, raising possibility of infectious diarrhea.  Sample sent for stool testing.  Patient relatively calm in the emergency department, after treatment.  At this point the patient does not appear to need hospitalization, for delirium, or aggressive medical management.  We will seek psychiatric evaluation, and possible placement.  Patient may need a second medical assessment in the morning, with repeat CBC, and chest x-ray.  Will contact hospitalist for possible overnight observation, and reassessment in the morning.     Daleen Bo, MD 07/29/16 [phone]

## 2016-07-28 NOTE — ED Notes (Signed)
Patient refused lab draw. RN aware.

## 2016-07-28 NOTE — ED Notes (Signed)
Cleaned pt legs, and bottom. Peri care completed.Skin redness/breakdown noted to bottom.

## 2016-07-28 NOTE — ED Notes (Signed)
Bed: WA03 Expected date:  Expected time:  Means of arrival:  Comments: EMS-psych

## 2016-07-28 NOTE — ED Provider Notes (Signed)
[facility] DEPT Provider Note   CSN: [id number] Arrival date & time: 07/28/16  1056     History   Chief Complaint Chief Complaint  Patient presents with  . IVC  . Hallucinations  . Suicidal    HPI Dominique Acosta is a 66 y.o. female.  HPI  Level V caveat due to acute psychiatric condition.  66 year old female with history of schizophrenia who presents for psychiatric evaluation. She is brought to the ED by EMS and [employer]. PD states that when they arrived to the patient's home, she was throwing things out of her ninth story apartment through a window. The patient was living in home with broken glass all over. She had soiled herself, in her apartment was in complete disarray. Patient herself cannot be redirected, and had reported disorganized behavior and speech. IVC paperwork is placed on her by [employer]. Brought to ed for evaluation. Patient with disorganized speech, not answering questions appropriately. Denies any complaints or pain.   Past Medical History:  Diagnosis Date  . Arm pain   . Chronic pain   . COPD (chronic obstructive pulmonary disease) ([place])   . Coronary artery disease   . Depression   . Dystonia   . Epilepsy ([place])   . High cholesterol   . Humerus fracture   . Hypothyroid   . Leukemia ([place])   . Myocardial infarct ([place])   . Schizophrenia ([place])   . Seizure [facility])     Patient Active Problem List   Diagnosis Date Noted  . Schizoaffective disorder, bipolar type ([place]) 07/08/2016  . Delirium 09/05/2014  . Acute encephalopathy 09/05/2014  . Chronic pain syndrome 09/05/2014  . Diarrhea 02/14/2011  . Schizoaffective disorder, depressive type ([place]) 01/20/2011  . Seizure disorder ([place]) 09/02/2010  . Fracture, humerus, head 11/05/2009  . CIGARETTE SMOKER 09/05/2007  . CHRONIC AIRWAY OBSTRUCTION NEC 07/11/2007  . Hypothyroidism 06/08/2006  . Hyperlipemia 06/08/2006    Past Surgical History:  Procedure Laterality Date  . EXTERNAL EAR SURGERY    . HUMERUS FRACTURE  SURGERY    . SHOULDER SURGERY      OB History    No data available       Home Medications    Prior to Admission medications   Medication Sig Start Date End Date Taking? Authorizing Provider  ABILIFY MAINTENA 400 MG PRSY Inject 400 mg as directed every 30 (thirty) days. 05/17/16   Historical Provider, MD  acetaminophen-codeine (TYLENOL #3) 300-30 MG tablet Take 1 tablet by mouth 4 (four) times daily.     Historical Provider, MD  acyclovir (ZOVIRAX) 200 MG capsule Take 200 mg by mouth 2 (two) times daily.    Historical Provider, MD  albuterol (PROVENTIL,VENTOLIN) 90 MCG/ACT inhaler Inhale 2 puffs into the lungs as directed. 1-2 puffs up to three times a day prn Patient taking differently: Inhale 1-2 puffs into the lungs 3 (three) times daily as needed for wheezing or shortness of breath. 1-2 puffs up to three times a day prn 11/22/10   Blane Ohara McDiarmid, MD  amantadine (SYMMETREL) 100 MG capsule Take 100 mg by mouth daily.    Historical Provider, MD  ARIPiprazole (ABILIFY) 20 MG tablet Take 1 tablet (20 mg total) by mouth daily. Patient not taking: Reported on 07/28/2016 06/26/16   Shuvon B Rankin, NP  ARIPiprazole (ABILIFY) 30 MG tablet Take 30 mg by mouth daily. 07/18/16   Historical Provider, MD  baclofen (LIORESAL) 10 MG tablet Take 10 mg by mouth 3 (three) times daily.  09/13/15   Historical Provider, MD  benztropine (COGENTIN) 2 MG tablet Take 1 tablet by mouth 3 (three) times daily. 01/06/14   Historical Provider, MD  darifenacin (ENABLEX) 7.5 MG 24 hr tablet Take 1 tablet (7.5 mg total) by mouth daily. 06/26/16   Shuvon B Rankin, NP  diphenhydrAMINE (BENADRYL) 25 mg capsule Take 25 mg by mouth every 6 (six) hours as needed for itching, allergies or sleep.    Historical Provider, MD  gabapentin (NEURONTIN) 800 MG tablet Take 800-1,600 mg by mouth 4 (four) times daily. Take 800mg  by mouth three times daily and 1600mg  by mouth at bedtime 07/18/16   Historical Provider, MD  levothyroxine (SYNTHROID,  LEVOTHROID) 88 MCG tablet Take 88 mcg by mouth daily.     Historical Provider, MD  LORazepam (ATIVAN) 1 MG tablet Take 1 tablet by mouth 2 (two) times daily.  03/12/14   Historical Provider, MD  omeprazole (PRILOSEC) 20 MG capsule Take 20 mg by mouth daily.    Historical Provider, MD  Oxycodone HCl 10 MG TABS Take 1 tablet by mouth 4 (four) times daily.  03/12/14   Historical Provider, MD  PROAIR HFA 108 (90 Base) MCG/ACT inhaler Inhale 2 puffs into the lungs every 6 (six) hours as needed for wheezing or shortness of breath.  04/12/16   Historical Provider, MD  promethazine (PHENERGAN) 25 MG tablet Take 25 mg by mouth 2 (two) times daily as needed for nausea or vomiting.    Historical Provider, MD  propranolol (INDERAL) 10 MG tablet Take 10 mg by mouth 2 (two) times daily.    Historical Provider, MD  simvastatin (ZOCOR) 40 MG tablet Take 40 mg by mouth daily.    Historical Provider, MD  solifenacin (VESICARE) 10 MG tablet Take 10 mg by mouth daily.    Historical Provider, MD  temazepam (RESTORIL) 15 MG capsule Take 1 capsule (15 mg total) by mouth at bedtime as needed for sleep. 07/09/16   Shuvon B Rankin, NP  temazepam (RESTORIL) 30 MG capsule Take 30 mg by mouth at bedtime.    Historical Provider, MD  venlafaxine (EFFEXOR-XR) 150 MG 24 hr capsule Take 150 mg by mouth 2 (two) times daily. Reported on 10/27/2015    Historical Provider, MD    Family History No family history on file.  Social History Social History  Substance Use Topics  . Smoking status: Current Every Day Smoker    Packs/day: 2.00    Years: 41.00    Types: Cigarettes  . Smokeless tobacco: Never Used  . Alcohol use No     Allergies   Amoxicillin; Penicillins; Phenytoin; Dilaudid [hydromorphone hcl]; Haldol [haloperidol]; and Morphine and related   Review of Systems Review of Systems  Unable to perform ROS: Psychiatric disorder     Physical Exam Updated Vital Signs BP (!) 100/47 (BP Location: Left Arm)   Pulse 68    Temp 99.2 F (37.3 C) (Oral)   Resp 16   SpO2 95%   Physical Exam Physical Exam  Nursing note and vitals reviewed. Constitutional: Disheveled, with dried stool up and down her legs bilaterally, non-toxic, and in no acute distress Head: Normocephalic. There is soft tissue bruising to the right temple  Mouth/Throat: Oropharynx is clear and moist.  Neck: Normal range of motion. Neck supple.  Cardiovascular: Normal rate and regular rhythm.   Pulmonary/Chest: Effort normal and breath sounds normal.  Abdominal: Soft. There is no tenderness. There is no rebound and no guarding.  Musculoskeletal: Normal range of motion.  Neurological: Alert, no facial droop, moves all extremities symmetrically Skin: Skin is warm and dry.  Psychiatric: with disorganized and tangential speech   ED Treatments / Results  Labs (all labs ordered are listed, but only abnormal results are displayed) Labs Reviewed  CBC WITH DIFFERENTIAL/PLATELET - Abnormal; Notable for the following:       Result Value   WBC 24.3 (*)    Neutro Abs 20.8 (*)    Monocytes Absolute 1.4 (*)    All other components within normal limits  COMPREHENSIVE METABOLIC PANEL - Abnormal; Notable for the following:    Potassium 2.8 (*)    AST 72 (*)    All other components within normal limits  URINALYSIS, ROUTINE W REFLEX MICROSCOPIC - Abnormal; Notable for the following:    APPearance HAZY (*)    Specific Gravity, Urine 1.032 (*)    Ketones, ur 20 (*)    Protein, ur 30 (*)    Squamous Epithelial / LPF 0-5 (*)    All other components within normal limits  RAPID URINE DRUG SCREEN, HOSP PERFORMED - Abnormal; Notable for the following:    Opiates POSITIVE (*)    Benzodiazepines POSITIVE (*)    All other components within normal limits  ACETAMINOPHEN LEVEL - Abnormal; Notable for the following:    Acetaminophen (Tylenol), Serum <10 (*)    All other components within normal limits  SALICYLATE LEVEL  ETHANOL  MAGNESIUM  CBC WITH  DIFFERENTIAL/PLATELET    EKG  EKG Interpretation  Date/Time:  Thursday July 28 2016 15:33:34 EDT Ventricular Rate:  71 PR Interval:    QRS Duration: 106 QT Interval:  438 QTC Calculation: 476 R Axis:   60 Text Interpretation:  Sinus rhythm Abnormal R-wave progression, early transition Borderline abnrm T, anterolateral leads flattened t-waves.  Confirmed by Jayni Prescher MD, [place] [phone]) on 07/28/2016 5:21:52 PM       Radiology Dg Chest 2 View  Result Date: 07/28/2016 CLINICAL DATA:  Mental status changes. EXAM: CHEST  2 VIEW COMPARISON:  06/24/2016 FINDINGS: Low volume film. The cardio pericardial silhouette is enlarged. Underlying interstitial changes again noted pneumonia at the left lung base cannot be entirely excluded. Patient is status post right shoulder replacement. IMPRESSION: Cardiomegaly with low volumes and potential airspace disease/ pneumonia at the left base. Electronically Signed   By: Misty Stanley M.D.   On: 07/28/2016 20:26   Ct Head Wo Contrast  Result Date: 07/28/2016 CLINICAL DATA:  Schizophrenia. Altered mental status. Disorganized speech. EXAM: CT HEAD WITHOUT CONTRAST TECHNIQUE: Contiguous axial images were obtained from the base of the skull through the vertex without intravenous contrast. COMPARISON:  10/27/2015 FINDINGS: Brain: Mild generalized brain atrophy. No evidence of old or acute focal infarction, mass lesion, hemorrhage, hydrocephalus or extra-axial collection. Vascular: There is atherosclerotic calcification of the major vessels at the base of the brain. Skull: Normal Sinuses/Orbits: Clear/normal Other: None IMPRESSION: Normal head CT for age. Electronically Signed   By: Nelson Chimes M.D.   On: 07/28/2016 14:39    Procedures Procedures (including critical care time)  Medications Ordered in ED Medications  LORazepam (ATIVAN) injection 2 mg (2 mg Intramuscular Given 07/28/16 1231)  potassium chloride SA (K-DUR,KLOR-CON) CR tablet 80 mEq (80 mEq Oral Given  07/28/16 1635)  LORazepam (ATIVAN) injection 2 mg (2 mg Intramuscular Given 07/28/16 1638)  ziprasidone (GEODON) injection 10 mg (10 mg Intramuscular Given 07/28/16 1812)  sterile water (preservative free) injection (1 mL  Given 07/28/16 1822)     Initial Impression /  Assessment and Plan / ED Course  I have reviewed the triage vital signs and the nursing notes.  Pertinent labs & imaging results that were available during my care of the patient were reviewed by me and considered in my medical decision making (see chart for details).      I reviewed the available records in [facility] and also Care Everywhere. History of schizoaffective disorder with multiple ED visits for psychotic behavior.   Patient unable to provide appropriate history, she is very disorganized thinking, rapid and tangential thinking. Poor eye contact, restless. She appears disheveled, with dried stool down her legs. She does have bruising over the right side of her head, and question potential head injury.  Will obtain CT head, urinalysis, basic blood work for medical clearance. IVC paperwork has been placed on patient by [employer].  Patient not aggressive or combative but refusing all blood work and imaging to be performed. Did require ativan to obtain medical clearance work-up.  With bruising over right side of head, and ? Trauma. CT head visulized and w/o ICH or other acute processes.  Blood work with leukocytosis of 24. Hypokalemia of 2.8, but patient refusing to take oral potassium supplementation. At this time, suspect UTI w/ delirium. Will obtain catheterized UA sample and CXR to rule out infection. Will require admission if positive for infection. Otherwise, psychiatric evaluation will be needed if negative infectious work-up.   Final Clinical Impressions(s) / ED Diagnoses   Final diagnoses:  Other elevated white blood cell (WBC) count    New Prescriptions New Prescriptions   No medications on file     Forde Dandy,  MD 07/28/16 2035

## 2016-07-28 NOTE — ED Triage Notes (Signed)
Per EMS, housing authority called due to pt throwing feces out of her apartment window. Pt's apartment was covered in feces and pill bottles strewn about. Pt was hallucinating, seeing Roe Rutherford. Pt was combative w/ EMS, threatening self with scissors and aide. Pt IVC'd by [employer].   Pt given 5mg  midazolam IM en route.  BP 98/68 HR 74 RR 18 SpO2 100% RA CBG 146

## 2016-07-28 NOTE — ED Notes (Signed)
Patient transported to X-ray 

## 2016-07-28 NOTE — ED Notes (Signed)
Pt went to restroom, only had BM

## 2016-07-29 ENCOUNTER — Emergency Department (HOSPITAL_COMMUNITY)
Admission: EM | Admit: 2016-07-29 | Discharge: 2016-07-30 | Disposition: A | Discharge status: Home / Self Care | Payer: Medicare Other | Source: Home / Self Care | Attending: Emergency Medicine | Admitting: Emergency Medicine

## 2016-07-29 ENCOUNTER — Emergency Department (HOSPITAL_COMMUNITY): Payer: Medicare Other

## 2016-07-29 DIAGNOSIS — J449 Chronic obstructive pulmonary disease, unspecified: Secondary | ICD-10-CM

## 2016-07-29 DIAGNOSIS — F17210 Nicotine dependence, cigarettes, uncomplicated: Secondary | ICD-10-CM

## 2016-07-29 DIAGNOSIS — I252 Old myocardial infarction: Secondary | ICD-10-CM | POA: Insufficient documentation

## 2016-07-29 DIAGNOSIS — E039 Hypothyroidism, unspecified: Secondary | ICD-10-CM

## 2016-07-29 DIAGNOSIS — F209 Schizophrenia, unspecified: Secondary | ICD-10-CM | POA: Diagnosis not present

## 2016-07-29 DIAGNOSIS — I2510 Atherosclerotic heart disease of native coronary artery without angina pectoris: Secondary | ICD-10-CM

## 2016-07-29 DIAGNOSIS — Z046 Encounter for general psychiatric examination, requested by authority: Secondary | ICD-10-CM

## 2016-07-29 DIAGNOSIS — Z711 Person with feared health complaint in whom no diagnosis is made: Secondary | ICD-10-CM

## 2016-07-29 LAB — BASIC METABOLIC PANEL
Anion gap: 9 (ref 5–15)
BUN: 13 mg/dL (ref 6–20)
CO2: 23 mmol/L (ref 22–32)
CREATININE: 0.83 mg/dL (ref 0.44–1.00)
Calcium: 8.4 mg/dL — ABNORMAL LOW (ref 8.9–10.3)
Chloride: 105 mmol/L (ref 101–111)
GFR calc Af Amer: >60 mL/min (ref >60)
GLUCOSE: 79 mg/dL (ref 65–99)
POTASSIUM: 3.5 mmol/L (ref 3.5–5.1)
SODIUM: 137 mmol/L (ref 135–145)

## 2016-07-29 LAB — CBC WITH DIFFERENTIAL/PLATELET
BASOS ABS: 0 10*3/uL (ref 0.0–0.1)
Basophils Absolute: 0 10*3/uL (ref 0.0–0.1)
Basophils Relative: 0 %
Basophils Relative: 0 %
EOS ABS: 0.2 10*3/uL (ref 0.0–0.7)
EOS ABS: 0.2 10*3/uL (ref 0.0–0.7)
EOS PCT: 1 %
EOS PCT: 2 %
HCT: 38.6 % (ref 36.0–46.0)
HCT: 39.2 % (ref 36.0–46.0)
Hemoglobin: 12.7 g/dL (ref 12.0–15.0)
Hemoglobin: 12.9 g/dL (ref 12.0–15.0)
LYMPHS ABS: 2.2 10*3/uL (ref 0.7–4.0)
LYMPHS PCT: 16 %
LYMPHS PCT: 17 %
Lymphs Abs: 2.2 10*3/uL (ref 0.7–4.0)
MCH: 30.7 pg (ref 26.0–34.0)
MCH: 30.6 pg (ref 26.0–34.0)
MCHC: 32.9 g/dL (ref 30.0–36.0)
MCHC: 32.9 g/dL (ref 30.0–36.0)
MCV: 93.2 fL (ref 78.0–100.0)
MCV: 92.9 fL (ref 78.0–100.0)
MONO ABS: 0.6 10*3/uL (ref 0.1–1.0)
MONO ABS: 0.8 10*3/uL (ref 0.1–1.0)
MONOS PCT: 5 %
Monocytes Relative: 6 %
Neutro Abs: 10.4 10*3/uL — ABNORMAL HIGH (ref 1.7–7.7)
Neutro Abs: 10 10*3/uL — ABNORMAL HIGH (ref 1.7–7.7)
Neutrophils Relative %: 78 %
Neutrophils Relative %: 75 %
PLATELETS: 323 10*3/uL (ref 150–400)
PLATELETS: 327 10*3/uL (ref 150–400)
RBC: 4.14 MIL/uL (ref 3.87–5.11)
RBC: 4.22 MIL/uL (ref 3.87–5.11)
RDW: 14.1 % (ref 11.5–15.5)
RDW: 14 % (ref 11.5–15.5)
WBC: 13.5 10*3/uL — AB (ref 4.0–10.5)
WBC: 13.2 10*3/uL — AB (ref 4.0–10.5)

## 2016-07-29 LAB — TSH: TSH: 46.863 u[IU]/mL — ABNORMAL HIGH (ref 0.350–4.500)

## 2016-07-29 LAB — TROPONIN I

## 2016-07-29 LAB — I-STAT CG4 LACTIC ACID, ED: Lactic Acid, Venous: 1.07 mmol/L (ref 0.5–1.9)

## 2016-07-29 MED ORDER — NYSTATIN 100000 UNIT/GM EX CREA: TOPICAL_CREAM | CUTANEOUS | 0 refills | Status: DC

## 2016-07-29 MED ORDER — ALBUTEROL 90 MCG/ACT IN AERS: 2.0000 | INHALATION_SPRAY | RESPIRATORY_TRACT | Status: DC

## 2016-07-29 MED ORDER — LEVOFLOXACIN 500 MG PO TABS: 500.0000 mg | ORAL_TABLET | Freq: Every day | ORAL | 0 refills | Status: DC

## 2016-07-29 MED ORDER — LEVOTHYROXINE SODIUM 150 MCG PO TABS: 150.0000 ug | ORAL_TABLET | Freq: Every day | ORAL | 1 refills | Status: DC

## 2016-07-29 MED ORDER — NYSTATIN 100000 UNIT/GM EX CREA
TOPICAL_CREAM | Freq: Two times a day (BID) | CUTANEOUS | Status: DC
  Administered 2016-07-29: 1 via TOPICAL
  Filled 2016-07-29: qty 15

## 2016-07-29 MED ORDER — LEVOFLOXACIN 500 MG PO TABS
500.0000 mg | ORAL_TABLET | Freq: Every day | ORAL | Status: DC
  Administered 2016-07-29: 500 mg via ORAL
  Filled 2016-07-29: qty 1

## 2016-07-29 MED ORDER — LEVOTHYROXINE SODIUM 150 MCG PO TABS
150.0000 ug | ORAL_TABLET | Freq: Every day | ORAL | Status: DC
  Filled 2016-07-29: qty 1

## 2016-07-29 MED ORDER — ALBUTEROL SULFATE HFA 108 (90 BASE) MCG/ACT IN AERS: 2.0000 | INHALATION_SPRAY | Freq: Four times a day (QID) | RESPIRATORY_TRACT | Status: DC | PRN

## 2016-07-29 MED ORDER — LORAZEPAM 1 MG PO TABS: 1.0000 mg | ORAL_TABLET | ORAL | Status: DC | PRN

## 2016-07-29 NOTE — ED Triage Notes (Signed)
Pt seen yesterday for medical clearance. Pt was taken to jail after being discharged earlier today. Pt sent back to hospital after refusing to answer to the magistrate. Pt refusing to speak with staff at this time.

## 2016-07-29 NOTE — ED Provider Notes (Signed)
4:48 PM Patient is seen and evaluated by the psychiatric team and felt to be stable for discharge from a psychiatric standpoint.  From medical standpoint her vital signs are stable.  She has no hypoxia.  She'll be treated as an outpatient with Levaquin for possible bronchitis or early pneumonia.  No hypoxia.  No increased work of breathing.  Close primary care follow-up.  Patient understands return to the ER for new or worsening symptoms.  Please see psychiatric consultation note for complete details   Jola Schmidt, MD 07/29/16 [phone]

## 2016-07-29 NOTE — ED Notes (Signed)
Attempted to redraw CBC. Unsuccessful attempt.

## 2016-07-29 NOTE — ED Notes (Signed)
Pt refused to have temp and pulse taken

## 2016-07-29 NOTE — ED Notes (Signed)
Bed: UN27 Expected date:  Expected time:  Means of arrival:  Comments: Room 3

## 2016-07-29 NOTE — ED Notes (Signed)
ED Provider at bedside. 

## 2016-07-29 NOTE — ED Notes (Signed)
[facility] contacted for discharge transport 

## 2016-07-29 NOTE — ED Notes (Signed)
Bed: WA27 Expected date:  Expected time:  Means of arrival:  Comments: ROOM 15

## 2016-07-29 NOTE — Progress Notes (Signed)
Patient ID: Dominique Acosta, female   DOB: 1950/11/14, 66 y.o.   MRN: [id number]  Patient is medical at this time.  If psychiatric care is needed during inpatient treatment, please place a consult.  Waylan Boga, PMHNP

## 2016-07-29 NOTE — ED Notes (Signed)
Lab called to collect blood as phlebotomy and nursing were unable to do so after multiple attempts.

## 2016-07-29 NOTE — Discharge Instructions (Signed)
Take your meds as prescribed earlier   See your doctor  Return to ER if you have thoughts of harming yourself or others, hallucinations.

## 2016-07-29 NOTE — ED Notes (Signed)
Lab called to redraw CBC.

## 2016-07-29 NOTE — ED Notes (Signed)
attempted to collect labs unsuccessful. RN have been made aware

## 2016-07-29 NOTE — ED Provider Notes (Signed)
Made aware of patient by nursing staff. Patient has not had disposition since last night. Waited for medical clearance and admission by hospitalist service (nurse reports hospitalist determined patient didn't need admission and should be observed in the ED overnight with EDP reassessment. Reportedly there was no communication on this) with associated schizophrenic presentation. 11:40  AM patient has not had further evaluation and I will now review entire case and re-evaluate patient for ongoing management.  13:00 . I have interviewed the patient and she is now alert and cooperative.doesn't recall a lot of details about police bringing her in yesterday. She states she thinks ativan helped her calm down, focus and be able to communicate with people. She reports that she's had a cough for several days. No chest pain no fever. Examination she is alert and appropriate.breath Sounds are clear. Heart regular. Abdomen soft NT.  Patient medically cleared for psych. Will need to complete levaquin as prescribed. And nystatin for glutteal cleft rash.  Patient has been discharged by psychiatric. At this time, medical problems can be managed with outpatient prescriptions. Patient is prescribed Levaquin for bronchitis. Nystatin for gluteal cleft rash. And thyroxine for hypothyroidism that is chronic. She shows no signs of compensated hypothyroidism. She has long history of noncompliance.   Charlesetta Shanks, MD 07/29/16 [phone]

## 2016-07-29 NOTE — ED Provider Notes (Signed)
[facility] [place] DEPT Provider Note   CSN: [id number] Arrival date & time: 07/29/16  2109     History   Chief Complaint Chief Complaint  Patient presents with  . Medical Clearance    HPI Roshan Salamon is a 66 y.o. female history of COPD, depression reasoning to the ER because she was not answering questions to the magistrate. Patient was in the ED yesterday and today and was seen by psychiatry multiple times and was cleared for discharge from psychiatry. Patient was diagnosed with bronchitis and was given a prescription of Levaquin. Patient apparently went to jail and refused to answer questions to the magistrate. Patient able to answer questions with me and denies any suicidal homicidal ideations. She states to me that she is "going home".  The history is provided by the patient.    Past Medical History:  Diagnosis Date  . Arm pain   . Chronic pain   . COPD (chronic obstructive pulmonary disease) ([place])   . Coronary artery disease   . Depression   . Dystonia   . Epilepsy ([place])   . High cholesterol   . Humerus fracture   . Hypothyroid   . Leukemia ([place])   . Myocardial infarct ([place])   . Schizophrenia ([place])   . Seizure [facility])     Patient Active Problem List   Diagnosis Date Noted  . Hypokalemia 07/28/2016  . Schizoaffective disorder, bipolar type ([place]) 07/08/2016  . Delirium 09/05/2014  . Acute encephalopathy 09/05/2014  . Chronic pain syndrome 09/05/2014  . Diarrhea 02/14/2011  . Schizoaffective disorder, depressive type ([place]) 01/20/2011  . Seizure disorder ([place]) 09/02/2010  . Fracture, humerus, head 11/05/2009  . CIGARETTE SMOKER 09/05/2007  . CHRONIC AIRWAY OBSTRUCTION NEC 07/11/2007  . Hypothyroidism 06/08/2006  . Hyperlipemia 06/08/2006    Past Surgical History:  Procedure Laterality Date  . EXTERNAL EAR SURGERY    . HUMERUS FRACTURE SURGERY    . SHOULDER SURGERY      OB History    No data available       Home Medications    Prior to Admission  medications   Medication Sig Start Date End Date Taking? Authorizing Provider  acetaminophen-codeine (TYLENOL #3) 300-30 MG tablet Take 1 tablet by mouth 4 (four) times daily.     Historical Provider, MD  acyclovir (ZOVIRAX) 200 MG capsule Take 200 mg by mouth 2 (two) times daily. 05/24/16   Historical Provider, MD  albuterol (PROVENTIL,VENTOLIN) 90 MCG/ACT inhaler Inhale 2 puffs into the lungs as directed. 1-2 puffs up to three times a day prn Patient not taking: Reported on 07/29/2016 11/22/10   Blane Ohara McDiarmid, MD  amantadine (SYMMETREL) 100 MG capsule Take 100 mg by mouth daily. 05/16/16   Historical Provider, MD  ARIPiprazole (ABILIFY) 20 MG tablet Take 1 tablet (20 mg total) by mouth daily. Patient not taking: Reported on 07/28/2016 06/26/16   Shuvon B Rankin, NP  ARIPiprazole (ABILIFY) 30 MG tablet Take 30 mg by mouth daily. 07/18/16   Historical Provider, MD  baclofen (LIORESAL) 10 MG tablet Take 10 mg by mouth 3 (three) times daily.  09/13/15   Historical Provider, MD  benztropine (COGENTIN) 2 MG tablet Take 1 tablet by mouth 3 (three) times daily. 01/06/14   Historical Provider, MD  darifenacin (ENABLEX) 7.5 MG 24 hr tablet Take 1 tablet (7.5 mg total) by mouth daily. Patient not taking: Reported on 07/29/2016 06/26/16   Shuvon B Rankin, NP  diphenhydrAMINE (BENADRYL) 25 mg capsule Take 25 mg by mouth  every 6 (six) hours as needed for itching, allergies or sleep.    Historical Provider, MD  gabapentin (NEURONTIN) 800 MG tablet Take 800-1,600 mg by mouth 4 (four) times daily. Take 800mg  by mouth three times daily and 1600mg  by mouth at bedtime 07/18/16   Historical Provider, MD  levofloxacin (LEVAQUIN) 500 MG tablet Take 1 tablet (500 mg total) by mouth daily. 07/29/16   Jola Schmidt, MD  levofloxacin (LEVAQUIN) 500 MG tablet Take 1 tablet (500 mg total) by mouth daily. 07/29/16   Charlesetta Shanks, MD  levothyroxine (SYNTHROID) 150 MCG tablet Take 1 tablet (150 mcg total) by mouth daily before breakfast.  07/29/16   Charlesetta Shanks, MD  LORazepam (ATIVAN) 1 MG tablet Take 1 tablet by mouth 2 (two) times daily.  03/12/14   Historical Provider, MD  nystatin cream (MYCOSTATIN) Apply to affected area 2 times daily 07/29/16   Charlesetta Shanks, MD  Oxycodone HCl 10 MG TABS Take 10 mg by mouth every 6 (six) hours as needed. 06/13/16   Historical Provider, MD  promethazine (PHENERGAN) 25 MG tablet Take 25 mg by mouth every 6 (six) hours as needed for nausea or vomiting.    Historical Provider, MD  propranolol (INDERAL) 10 MG tablet Take 10 mg by mouth 2 (two) times daily. 05/16/16   Historical Provider, MD  simvastatin (ZOCOR) 40 MG tablet Take by mouth daily.     Historical Provider, MD  temazepam (RESTORIL) 15 MG capsule Take 1 capsule (15 mg total) by mouth at bedtime as needed for sleep. 07/09/16   Shuvon B Rankin, NP  temazepam (RESTORIL) 30 MG capsule Take 30 mg by mouth at bedtime.    Historical Provider, MD  venlafaxine (EFFEXOR-XR) 150 MG 24 hr capsule Take 150 mg by mouth 2 (two) times daily. Reported on 10/27/2015    Historical Provider, MD  VESICARE 10 MG tablet Take 10 mg by mouth daily. 06/13/16   Historical Provider, MD    Family History No family history on file.  Social History Social History  Substance Use Topics  . Smoking status: Current Every Day Smoker    Packs/day: 2.00    Years: 41.00    Types: Cigarettes  . Smokeless tobacco: Never Used  . Alcohol use No     Allergies   Amoxicillin; Penicillins; Phenytoin; Dilaudid [hydromorphone hcl]; Haldol [haloperidol]; and Morphine and related   Review of Systems Review of Systems  All other systems reviewed and are negative.    Physical Exam Updated Vital Signs BP (!) 156/92 (BP Location: Left Leg)   Resp 16   Physical Exam  Constitutional: She is oriented to person, place, and time. She appears well-developed and well-nourished.  HENT:  Head: Normocephalic.  Mouth/Throat: Oropharynx is clear and moist.  Eyes: EOM are normal.  Pupils are equal, round, and reactive to light.  Neck: Normal range of motion. Neck supple.  Cardiovascular: Normal rate.   Pulmonary/Chest: Effort normal and breath sounds normal.  Abdominal: Soft. Bowel sounds are normal.  Musculoskeletal: Normal range of motion.  Neurological: She is alert and oriented to person, place, and time. No cranial nerve deficit. Coordination normal.  Skin: Skin is warm.  Psychiatric:  Not suicidal or homicidal   Nursing note and vitals reviewed.    ED Treatments / Results  Labs (all labs ordered are listed, but only abnormal results are displayed) Labs Reviewed - No data to display  EKG  EKG Interpretation None       Radiology Dg Chest 2 View  Result Date: 07/29/2016 CLINICAL DATA:  Low-grade fevers EXAM: CHEST  2 VIEW COMPARISON:  07/28/2016 FINDINGS: Cardiac shadow is at the upper limits of normal in size. The lungs are hypoinflated with some crowding of the vascular markings. This is stable from the prior exam. Mild left basilar atelectasis is noted. No sizable effusion is seen. Postsurgical changes in the right shoulder are noted. IMPRESSION: Stable hypoinflation and mild left basilar atelectasis. Electronically Signed   By: Inez Catalina M.D.   On: 07/29/2016 07:57   Dg Chest 2 View  Result Date: 07/28/2016 CLINICAL DATA:  Mental status changes. EXAM: CHEST  2 VIEW COMPARISON:  06/24/2016 FINDINGS: Low volume film. The cardio pericardial silhouette is enlarged. Underlying interstitial changes again noted pneumonia at the left lung base cannot be entirely excluded. Patient is status post right shoulder replacement. IMPRESSION: Cardiomegaly with low volumes and potential airspace disease/ pneumonia at the left base. Electronically Signed   By: Misty Stanley M.D.   On: 07/28/2016 20:26   Ct Head Wo Contrast  Result Date: 07/28/2016 CLINICAL DATA:  Schizophrenia. Altered mental status. Disorganized speech. EXAM: CT HEAD WITHOUT CONTRAST TECHNIQUE:  Contiguous axial images were obtained from the base of the skull through the vertex without intravenous contrast. COMPARISON:  10/27/2015 FINDINGS: Brain: Mild generalized brain atrophy. No evidence of old or acute focal infarction, mass lesion, hemorrhage, hydrocephalus or extra-axial collection. Vascular: There is atherosclerotic calcification of the major vessels at the base of the brain. Skull: Normal Sinuses/Orbits: Clear/normal Other: None IMPRESSION: Normal head CT for age. Electronically Signed   By: Nelson Chimes M.D.   On: 07/28/2016 14:39    Procedures Procedures (including critical care time)  Medications Ordered in ED Medications - No data to display   Initial Impression / Assessment and Plan / ED Course  I have reviewed the triage vital signs and the nursing notes.  Pertinent labs & imaging results that were available during my care of the patient were reviewed by me and considered in my medical decision making (see chart for details).     Melitza Metheny is a 66 y.o. female here because she refused to answer questions at jail. She answered questions with me and denies suicidal or homicidal ideations. Vitals from earlier unremarkable. Already had prescription for levaquin. Stable for discharge   Final Clinical Impressions(s) / ED Diagnoses   Final diagnoses:  None    New Prescriptions New Prescriptions   No medications on file     Drenda Freeze, MD 07/29/16 2135

## 2016-07-29 NOTE — BH Assessment (Addendum)
Assessment Note  Patient is a 66 year old female that denies SI/HI/Psychosis/Substance Abuse.  Patient reports that she lives alone.  Patient reports two days ago she was brought to the ED due to throwing things out of her ninth story apartment through a window. The patient was living in home with broken glass all over.  Per documentation from Dr. Eulis Foster in the epic chart the patient was treated for a UTI.   Patient reports receiving psychiatric medication management for her depression from her PCP Dr. Alford Highland.  Patient reports compliance with taking her medication.  Patient reports that she has a home health aid that comes to her home to assist her with daily living activities.   Per Theodoro Clock, DNP - patient does not meet criteria for inpatient hospitalization.    Diagnosis:   Past Medical History:  Past Medical History:  Diagnosis Date  . Arm pain   . Chronic pain   . COPD (chronic obstructive pulmonary disease) ([place])   . Coronary artery disease   . Depression   . Dystonia   . Epilepsy ([place])   . High cholesterol   . Humerus fracture   . Hypothyroid   . Leukemia ([place])   . Myocardial infarct ([place])   . Schizophrenia ([place])   . Seizure [facility])     Past Surgical History:  Procedure Laterality Date  . EXTERNAL EAR SURGERY    . HUMERUS FRACTURE SURGERY    . SHOULDER SURGERY      Family History: No family history on file.  Social History:  reports that she has been smoking Cigarettes.  She has a 82.00 pack-year smoking history. She has never used smokeless tobacco. She reports that she does not drink alcohol or use drugs.  Additional Social History:  Alcohol / Drug Use History of alcohol / drug use?: No history of alcohol / drug abuse  CIWA: CIWA-Ar BP: (!) 128/58 Pulse Rate: 66 COWS:    Allergies:  Allergies  Allergen Reactions  . Amoxicillin Anaphylaxis  . Penicillins Anaphylaxis    Has patient had a PCN reaction causing immediate rash, facial/tongue/throat swelling, SOB  or lightheadedness with hypotension: yes Has patient had a PCN reaction causing severe rash involving mucus membranes or skin necrosis: no Has patient had a PCN reaction that required hospitalization: yes Has patient had a PCN reaction occurring within the last 10 years: no If all of the above answers are "NO", then may proceed with Cephalosporin use.   Marland Kitchen Phenytoin Other (See Comments)    Other Reaction: CNS Disorder  . Dilaudid [Hydromorphone Hcl] Other (See Comments)    Psychosis (per patient)  . Haldol [Haloperidol]     Psychosis per caregiver  . Morphine And Related Other (See Comments)    psychosis    Home Medications:  (Not in a hospital admission)  OB/GYN Status:  No LMP recorded. Patient is postmenopausal.  General Assessment Data Location of Assessment: [facility] TTS Assessment: In system Is this a Tele or Face-to-Face Assessment?: Face-to-Face Is this an Initial Assessment or a Re-assessment for this encounter?: Initial Assessment Marital status: Single Maiden name: NA Is patient pregnant?: No Pregnancy Status: No Living Arrangements: Alone Can pt return to current living arrangement?: Yes Admission Status: Voluntary Is patient capable of signing voluntary admission?: Yes Referral Source: Self/Family/Friend Insurance type: uhc     Crisis Care Plan Living Arrangements: Alone Legal Guardian:  (NA) Name of Psychiatrist: None Reported Name of Therapist: None Reported  Education Status Is patient currently in school?:  No Current Grade: NA Highest grade of school patient has completed: NA Name of school: NA Contact person: NA  Risk to self with the past 6 months Suicidal Ideation: No Has patient been a risk to self within the past 6 months prior to admission? : No Suicidal Intent: No Has patient had any suicidal intent within the past 6 months prior to admission? : No Is patient at risk for suicide?: No Suicidal Plan?: No Has patient had any suicidal plan  within the past 6 months prior to admission? : No Access to Means: No What has been your use of drugs/alcohol within the last 12 months?: NA Previous Attempts/Gestures: No How many times?: 0 Other Self Harm Risks: NA Triggers for Past Attempts: None known Intentional Self Injurious Behavior: None Family Suicide History: No Recent stressful life event(s): Conflict (Comment) (Pt does not like her home health aid) Persecutory voices/beliefs?: No Depression: Yes Depression Symptoms: Despondent, Isolating, Fatigue, Feeling worthless/self pity, Loss of interest in usual pleasures Substance abuse history and/or treatment for substance abuse?: No Suicide prevention information given to non-admitted patients: Not applicable  Risk to Others within the past 6 months Homicidal Ideation: No Does patient have any lifetime risk of violence toward others beyond the six months prior to admission? : No Thoughts of Harm to Others: No Current Homicidal Intent: No Current Homicidal Plan: No Access to Homicidal Means: No Identified Victim: NA History of harm to others?: No Assessment of Violence: None Noted Violent Behavior Description: NA Does patient have access to weapons?: No Criminal Charges Pending?: No  Psychosis Hallucinations: None noted Delusions: None noted  Mental Status Report Appearance/Hygiene: Disheveled, In scrubs Eye Contact: Fair Motor Activity: Freedom of movement Speech: Rapid, Incoherent, Pressured Level of Consciousness: Alert, Restless, Irritable Mood: Depressed, Suspicious Affect: Depressed, Fearful, Frightened, Irritable, Labile, Sad Anxiety Level: None Thought Processes: Coherent, Relevant Judgement: Impaired Orientation: Person, Place, Time, Situation Obsessive Compulsive Thoughts/Behaviors: None  Cognitive Functioning Concentration: Decreased Memory: Recent Intact, Remote Intact IQ: Average Insight: Fair Impulse Control: Fair Appetite: Fair Weight Loss:  0 Weight Gain: 0 Sleep: Decreased Total Hours of Sleep: 2 Vegetative Symptoms: Decreased grooming, Not bathing, Staying in bed  ADLScreening [facility]) Patient's cognitive ability adequate to safely complete daily activities?: Yes Patient able to express need for assistance with ADLs?: Yes Independently performs ADLs?: Yes (appropriate for developmental age)  Prior Inpatient Therapy Prior Inpatient Therapy: Yes Prior Therapy Dates: Unable to remember Prior Therapy Facilty/Provider(s): [facility] Reason for Treatment: Depression   Prior Outpatient Therapy Prior Outpatient Therapy: Yes Prior Therapy Dates: Ongoing Prior Therapy Facilty/Provider(s): Primary Care Physician for psych meds  (Dr. Mancel Bale ) Reason for Treatment: Psychiatric medication mamagement  Does patient have an ACCT team?: No Does patient have Intensive In-House Services?  : No Does patient have [facility] services? : No Does patient have P4CC services?: No  ADL Screening (condition at time of admission) Patient's cognitive ability adequate to safely complete daily activities?: Yes Is the patient deaf or have difficulty hearing?: No Does the patient have difficulty seeing, even when wearing glasses/contacts?: No Does the patient have difficulty concentrating, remembering, or making decisions?: Yes Patient able to express need for assistance with ADLs?: Yes Does the patient have difficulty dressing or bathing?: No Independently performs ADLs?: Yes (appropriate for developmental age) Does the patient have difficulty walking or climbing stairs?: Yes Weakness of Legs: None Weakness of Arms/Hands: None  Home Assistive Devices/Equipment Home Assistive Devices/Equipment: None  Therapy Consults (therapy consults require a physician order)  PT Evaluation Needed: No OT Evalulation Needed: No SLP Evaluation Needed: No Abuse/Neglect Assessment (Assessment to be complete while patient is alone) Physical Abuse:  Denies Verbal Abuse: Denies Sexual Abuse: Denies Exploitation of patient/patient's resources: Denies Self-Neglect: Denies Values / Beliefs Cultural Requests During Hospitalization: None Spiritual Requests During Hospitalization: None Consults Spiritual Care Consult Needed: No Social Work Consult Needed: No Regulatory affairs officer (For Healthcare) Does Patient Have a Medical Advance Directive?: No Would patient like information on creating a medical advance directive?: No - Patient declined Nutrition Screen- [facility] Adult/[facility] Patient's home diet: Regular Has the patient recently lost weight without trying?: No Has the patient been eating poorly because of a decreased appetite?: Yes Malnutrition Screening Tool Score: 1  Additional Information 1:1 In Past 12 Months?: No CIRT Risk: No Elopement Risk: No Does patient have medical clearance?: Yes     Disposition: Per Theodoro Clock, DNP - patient does not meet criteria for inpatient hospitalization.  Patient will be able to follow up with her established provider.  Writer informed Dr. Venora Maples   Disposition Initial Assessment Completed for this Encounter: Yes  On Site Evaluation by:   Reviewed with Physician:    Graciella Freer [place] 07/29/2016 2:49 PM

## 2016-07-29 NOTE — ED Notes (Signed)
Attempted to call report. Receiving nurse to call bck for report.

## 2016-07-30 NOTE — ED Notes (Signed)
Pt discharged home via [facility]. Paperwork sent with her. She refused to sign at discharge or to receive vital signs. Denies any pain.

## 2016-07-30 NOTE — ED Notes (Signed)
Patient refusing vitals

## 2016-08-03 ENCOUNTER — Emergency Department (HOSPITAL_COMMUNITY): Payer: Medicare Other

## 2016-08-03 ENCOUNTER — Encounter (HOSPITAL_COMMUNITY): Payer: Self-pay | Admitting: Emergency Medicine

## 2016-08-03 ENCOUNTER — Inpatient Hospital Stay (HOSPITAL_COMMUNITY)
Admission: EM | Admit: 2016-08-03 | Discharge: 2016-08-06 | DRG: 689 | Disposition: A | Discharge status: Home Health | Payer: Medicare Other | Attending: Internal Medicine | Admitting: Internal Medicine

## 2016-08-03 DIAGNOSIS — E039 Hypothyroidism, unspecified: Secondary | ICD-10-CM | POA: Diagnosis present

## 2016-08-03 DIAGNOSIS — E038 Other specified hypothyroidism: Secondary | ICD-10-CM | POA: Diagnosis not present

## 2016-08-03 DIAGNOSIS — Z856 Personal history of leukemia: Secondary | ICD-10-CM

## 2016-08-03 DIAGNOSIS — E785 Hyperlipidemia, unspecified: Secondary | ICD-10-CM | POA: Diagnosis present

## 2016-08-03 DIAGNOSIS — E876 Hypokalemia: Secondary | ICD-10-CM | POA: Diagnosis present

## 2016-08-03 DIAGNOSIS — E7800 Pure hypercholesterolemia, unspecified: Secondary | ICD-10-CM | POA: Diagnosis present

## 2016-08-03 DIAGNOSIS — Z885 Allergy status to narcotic agent status: Secondary | ICD-10-CM

## 2016-08-03 DIAGNOSIS — D72829 Elevated white blood cell count, unspecified: Secondary | ICD-10-CM | POA: Diagnosis not present

## 2016-08-03 DIAGNOSIS — G9341 Metabolic encephalopathy: Secondary | ICD-10-CM | POA: Diagnosis present

## 2016-08-03 DIAGNOSIS — J449 Chronic obstructive pulmonary disease, unspecified: Secondary | ICD-10-CM | POA: Diagnosis present

## 2016-08-03 DIAGNOSIS — I2510 Atherosclerotic heart disease of native coronary artery without angina pectoris: Secondary | ICD-10-CM | POA: Diagnosis present

## 2016-08-03 DIAGNOSIS — Z888 Allergy status to other drugs, medicaments and biological substances status: Secondary | ICD-10-CM

## 2016-08-03 DIAGNOSIS — N3000 Acute cystitis without hematuria: Secondary | ICD-10-CM | POA: Diagnosis present

## 2016-08-03 DIAGNOSIS — R627 Adult failure to thrive: Secondary | ICD-10-CM | POA: Diagnosis present

## 2016-08-03 DIAGNOSIS — G40909 Epilepsy, unspecified, not intractable, without status epilepticus: Secondary | ICD-10-CM | POA: Diagnosis present

## 2016-08-03 DIAGNOSIS — F17210 Nicotine dependence, cigarettes, uncomplicated: Secondary | ICD-10-CM | POA: Diagnosis present

## 2016-08-03 DIAGNOSIS — F250 Schizoaffective disorder, bipolar type: Secondary | ICD-10-CM | POA: Diagnosis present

## 2016-08-03 DIAGNOSIS — I252 Old myocardial infarction: Secondary | ICD-10-CM

## 2016-08-03 DIAGNOSIS — N390 Urinary tract infection, site not specified: Secondary | ICD-10-CM | POA: Diagnosis not present

## 2016-08-03 DIAGNOSIS — I10 Essential (primary) hypertension: Secondary | ICD-10-CM | POA: Diagnosis present

## 2016-08-03 DIAGNOSIS — F200 Paranoid schizophrenia: Secondary | ICD-10-CM

## 2016-08-03 DIAGNOSIS — Z880 Allergy status to penicillin: Secondary | ICD-10-CM

## 2016-08-03 DIAGNOSIS — Z87892 Personal history of anaphylaxis: Secondary | ICD-10-CM

## 2016-08-03 DIAGNOSIS — G894 Chronic pain syndrome: Secondary | ICD-10-CM | POA: Diagnosis present

## 2016-08-03 LAB — CBC WITH DIFFERENTIAL/PLATELET
BASOS ABS: 0 10*3/uL (ref 0.0–0.1)
Basophils Absolute: 0 10*3/uL (ref 0.0–0.1)
Basophils Relative: 0 %
Basophils Relative: 0 %
EOS PCT: 0 %
Eosinophils Absolute: 0.1 10*3/uL (ref 0.0–0.7)
Eosinophils Absolute: 0.2 10*3/uL (ref 0.0–0.7)
Eosinophils Relative: 1 %
HCT: 46.8 % — ABNORMAL HIGH (ref 36.0–46.0)
HEMATOCRIT: 44 % (ref 36.0–46.0)
HEMOGLOBIN: 15.3 g/dL — AB (ref 12.0–15.0)
Hemoglobin: 16.1 g/dL — ABNORMAL HIGH (ref 12.0–15.0)
LYMPHS ABS: 1.8 10*3/uL (ref 0.7–4.0)
LYMPHS ABS: 2.4 10*3/uL (ref 0.7–4.0)
LYMPHS PCT: 9 %
Lymphocytes Relative: 14 %
MCH: 30.2 pg (ref 26.0–34.0)
MCH: 31.2 pg (ref 26.0–34.0)
MCHC: 34.8 g/dL (ref 30.0–36.0)
MCHC: 34.4 g/dL (ref 30.0–36.0)
MCV: 86.8 fL (ref 78.0–100.0)
MCV: 90.7 fL (ref 78.0–100.0)
MONO ABS: 0.5 10*3/uL (ref 0.1–1.0)
Monocytes Absolute: 0.8 10*3/uL (ref 0.1–1.0)
Monocytes Relative: 4 %
Monocytes Relative: 3 %
NEUTROS PCT: 87 %
NEUTROS PCT: 82 %
Neutro Abs: 17.5 10*3/uL — ABNORMAL HIGH (ref 1.7–7.7)
Neutro Abs: 14.3 10*3/uL — ABNORMAL HIGH (ref 1.7–7.7)
PLATELETS: 451 10*3/uL — AB (ref 150–400)
Platelets: 459 10*3/uL — ABNORMAL HIGH (ref 150–400)
RBC: 5.07 MIL/uL (ref 3.87–5.11)
RBC: 5.16 MIL/uL — AB (ref 3.87–5.11)
RDW: 14.2 % (ref 11.5–15.5)
RDW: 13.9 % (ref 11.5–15.5)
WBC: 20.2 10*3/uL — AB (ref 4.0–10.5)
WBC: 17.4 10*3/uL — AB (ref 4.0–10.5)

## 2016-08-03 LAB — COMPREHENSIVE METABOLIC PANEL
ALBUMIN: 4.2 g/dL (ref 3.5–5.0)
ALK PHOS: 94 U/L (ref 38–126)
ALT: 25 U/L (ref 14–54)
ALT: 22 U/L (ref 14–54)
ANION GAP: 16 — AB (ref 5–15)
AST: 26 U/L (ref 15–41)
AST: 23 U/L (ref 15–41)
Albumin: 4.3 g/dL (ref 3.5–5.0)
Alkaline Phosphatase: 88 U/L (ref 38–126)
Anion gap: 19 — ABNORMAL HIGH (ref 5–15)
BUN: 14 mg/dL (ref 6–20)
BUN: 14 mg/dL (ref 6–20)
CALCIUM: 8.9 mg/dL (ref 8.9–10.3)
CHLORIDE: 95 mmol/L — AB (ref 101–111)
CO2: 23 mmol/L (ref 22–32)
CO2: 22 mmol/L (ref 22–32)
Calcium: 9 mg/dL (ref 8.9–10.3)
Chloride: 97 mmol/L — ABNORMAL LOW (ref 101–111)
Creatinine, Ser: 0.82 mg/dL (ref 0.44–1.00)
Creatinine, Ser: 0.85 mg/dL (ref 0.44–1.00)
GFR calc Af Amer: >60 mL/min (ref >60)
GFR calc non Af Amer: >60 mL/min (ref >60)
GFR calc non Af Amer: >60 mL/min (ref >60)
GLUCOSE: 101 mg/dL — AB (ref 65–99)
Glucose, Bld: 84 mg/dL (ref 65–99)
POTASSIUM: 3 mmol/L — AB (ref 3.5–5.1)
POTASSIUM: 3.1 mmol/L — AB (ref 3.5–5.1)
SODIUM: 137 mmol/L (ref 135–145)
SODIUM: 135 mmol/L (ref 135–145)
TOTAL PROTEIN: 7.7 g/dL (ref 6.5–8.1)
Total Bilirubin: 1.2 mg/dL (ref 0.3–1.2)
Total Bilirubin: 1.1 mg/dL (ref 0.3–1.2)
Total Protein: 7.9 g/dL (ref 6.5–8.1)

## 2016-08-03 LAB — URINALYSIS, ROUTINE W REFLEX MICROSCOPIC
Bilirubin Urine: NEGATIVE
GLUCOSE, UA: NEGATIVE mg/dL
Hgb urine dipstick: NEGATIVE
Ketones, ur: 80 mg/dL — AB
Leukocytes, UA: NEGATIVE
NITRITE: NEGATIVE
PH: 5 (ref 5.0–8.0)
Protein, ur: 30 mg/dL — AB
Specific Gravity, Urine: 1.025 (ref 1.005–1.030)

## 2016-08-03 LAB — RAPID URINE DRUG SCREEN, HOSP PERFORMED
AMPHETAMINES: NOT DETECTED
BARBITURATES: NOT DETECTED
BENZODIAZEPINES: POSITIVE — AB
Cocaine: NOT DETECTED
Opiates: NOT DETECTED
TETRAHYDROCANNABINOL: NOT DETECTED

## 2016-08-03 LAB — SALICYLATE LEVEL: Salicylate Lvl: <7 mg/dL (ref 2.8–30.0)

## 2016-08-03 LAB — PHOSPHORUS: PHOSPHORUS: 2.8 mg/dL (ref 2.5–4.6)

## 2016-08-03 LAB — MAGNESIUM: Magnesium: 2.1 mg/dL (ref 1.7–2.4)

## 2016-08-03 LAB — ETHANOL

## 2016-08-03 LAB — ACETAMINOPHEN LEVEL: Acetaminophen (Tylenol), Serum: <10 ug/mL — ABNORMAL LOW (ref 10–30)

## 2016-08-03 MED ORDER — ACETAMINOPHEN 650 MG RE SUPP: 650.0000 mg | Freq: Four times a day (QID) | RECTAL | Status: DC | PRN

## 2016-08-03 MED ORDER — ACETAMINOPHEN 325 MG PO TABS
650.0000 mg | ORAL_TABLET | Freq: Four times a day (QID) | ORAL | Status: DC | PRN
  Administered 2016-08-05 (×2): 650 mg via ORAL
  Filled 2016-08-03 (×2): qty 2

## 2016-08-03 MED ORDER — CIPROFLOXACIN IN D5W 400 MG/200ML IV SOLN
400.0000 mg | Freq: Two times a day (BID) | INTRAVENOUS | Status: DC
  Administered 2016-08-03 – 2016-08-04 (×2): 400 mg via INTRAVENOUS
  Filled 2016-08-03 (×2): qty 200

## 2016-08-03 MED ORDER — ENOXAPARIN SODIUM 40 MG/0.4ML ~~LOC~~ SOLN
40.0000 mg | SUBCUTANEOUS | Status: DC
  Administered 2016-08-03 – 2016-08-05 (×3): 40 mg via SUBCUTANEOUS
  Filled 2016-08-03 (×3): qty 0.4

## 2016-08-03 MED ORDER — BENZTROPINE MESYLATE 2 MG PO TABS
2.0000 mg | ORAL_TABLET | Freq: Two times a day (BID) | ORAL | Status: DC
  Administered 2016-08-03 – 2016-08-04 (×3): 2 mg via ORAL
  Filled 2016-08-03: qty 2
  Filled 2016-08-03 (×2): qty 1

## 2016-08-03 MED ORDER — SIMVASTATIN 40 MG PO TABS
40.0000 mg | ORAL_TABLET | Freq: Every day | ORAL | Status: DC
  Administered 2016-08-03 – 2016-08-05 (×3): 40 mg via ORAL
  Filled 2016-08-03 (×3): qty 1

## 2016-08-03 MED ORDER — ARIPIPRAZOLE 15 MG PO TABS
30.0000 mg | ORAL_TABLET | Freq: Every day | ORAL | Status: DC
  Administered 2016-08-04: 30 mg via ORAL
  Filled 2016-08-03: qty 2

## 2016-08-03 MED ORDER — PROPRANOLOL HCL 10 MG PO TABS
10.0000 mg | ORAL_TABLET | Freq: Two times a day (BID) | ORAL | Status: DC
  Administered 2016-08-03 – 2016-08-06 (×7): 10 mg via ORAL
  Filled 2016-08-03 (×7): qty 1

## 2016-08-03 MED ORDER — LEVOTHYROXINE SODIUM 150 MCG PO TABS
150.0000 ug | ORAL_TABLET | Freq: Every day | ORAL | Status: DC
  Administered 2016-08-03: 150 ug via ORAL
  Filled 2016-08-03: qty 1

## 2016-08-03 MED ORDER — POTASSIUM CHLORIDE CRYS ER 20 MEQ PO TBCR
40.0000 meq | EXTENDED_RELEASE_TABLET | Freq: Once | ORAL | Status: AC
  Administered 2016-08-03: 40 meq via ORAL
  Filled 2016-08-03: qty 2

## 2016-08-03 MED ORDER — ONDANSETRON HCL 4 MG/2ML IJ SOLN: 4.0000 mg | Freq: Four times a day (QID) | INTRAMUSCULAR | Status: DC | PRN

## 2016-08-03 MED ORDER — NITROFURANTOIN MONOHYD MACRO 100 MG PO CAPS
100.0000 mg | ORAL_CAPSULE | Freq: Two times a day (BID) | ORAL | Status: DC
  Administered 2016-08-03: 100 mg via ORAL
  Filled 2016-08-03: qty 1

## 2016-08-03 MED ORDER — LORAZEPAM 1 MG PO TABS
1.0000 mg | ORAL_TABLET | Freq: Two times a day (BID) | ORAL | Status: DC
  Administered 2016-08-03 – 2016-08-06 (×7): 1 mg via ORAL
  Filled 2016-08-03 (×7): qty 1

## 2016-08-03 MED ORDER — ONDANSETRON HCL 4 MG PO TABS: 4.0000 mg | ORAL_TABLET | Freq: Four times a day (QID) | ORAL | Status: DC | PRN

## 2016-08-03 MED ORDER — SODIUM CHLORIDE 0.9 % IV SOLN
INTRAVENOUS | Status: DC
  Administered 2016-08-03 – 2016-08-05 (×3): via INTRAVENOUS

## 2016-08-03 MED ORDER — ARIPIPRAZOLE 10 MG PO TABS
20.0000 mg | ORAL_TABLET | Freq: Every day | ORAL | Status: DC
  Administered 2016-08-03: 20 mg via ORAL
  Filled 2016-08-03: qty 2

## 2016-08-03 MED ORDER — LEVOTHYROXINE SODIUM 88 MCG PO TABS
88.0000 ug | ORAL_TABLET | Freq: Every day | ORAL | Status: DC
  Administered 2016-08-04 – 2016-08-06 (×3): 88 ug via ORAL
  Filled 2016-08-03 (×3): qty 1

## 2016-08-03 NOTE — Progress Notes (Signed)
At 1520, I received a phone call from Dorrene German (phone [phone]), a neighbor of patient, who states that patent is in an apartment that is filled with feces and an unclean environment. Neighbor states that patient is unable care for self and uses the bathroom on the floor. Neighbor also states that she feels that it is important to make the hospital aware of this concern prior to her being released home. Neighbor thinks patient should be placed in a facility.

## 2016-08-03 NOTE — ED Notes (Signed)
Pt whispering to herself. When asked why she is whispering she says that it is because she has her teeth in. Pt can speak in a normal pitched tone. Pt is aware the month and who the president is. Difficulty answering questions, must ask multiple times.

## 2016-08-03 NOTE — ED Notes (Signed)
Main lab phlebotomy called to obtain blood work

## 2016-08-03 NOTE — Progress Notes (Signed)
CSW spoke with patients sister [name], [phone], to gather more information about patient current living situation. Patient lives alone in apartment. Patient does have caregiver- Drinda Butts, [phone], that meets with patient weekly. Sister was recently at patients house 1 week ago and did not notice any feces. Sister did state she noticed patient taking her pills "like candy" and this could cause her confusion. CSW will update EDP.   Kingsley Spittle, LCSWA Clinical Social Worker [phone]

## 2016-08-03 NOTE — ED Notes (Signed)
Social work at bedside.  

## 2016-08-03 NOTE — ED Notes (Signed)
Upon arrival into patient's room, RN notified patient had removed IV. IV team consulted to insert new IV.

## 2016-08-03 NOTE — ED Triage Notes (Addendum)
Per [facility], pt being brought in with [facility] from independent living facility. Pts living conditions deemed unacceptable. Apartment covered in feces and spilled pill bottles. Pt has Hx of schizophrenia. Pt currently looking off to the side and talking to people who are not present and will not acknowledge questions from staff. Pt recently seen for related incident.

## 2016-08-03 NOTE — H&P (Addendum)
History and Physical    Dominique Acosta YTK:[id number] DOB: 05-Apr-1951 DOA: 08/03/2016  Referring MD/NP/PA:   PCP: Myriam Jacobson, MD   Outpatient Specialists: Dr. Christy Gentles  Patient coming from: ?ALF  Chief Complaint: Altered mental status  HPI: Dominique Acosta is a 66 y.o. female with medical history significant for schizoaffective disorder, bipolar disorder and depression, hypertension, dyslipidemia, apparently lives in assisted living facility and does have a caregiver. Because of her altered mental status she's not a good historian. History obtained from ED physician as well as review of patient's chart. Patient apparently more confused over past 24 hours prior to this admission but no reports of fevers or respiratory distress. Patient has no complaints at this time off abdominal pain, nausea or vomiting. No reports of chest pain.  ED Course: In ED, blood pressure was stable, patient was afebrile and her oxygen saturation was 95% on room air. Blood work was significant for white blood cell count of 20.2, potassium 3 this was supplemented in ED. Chest x-ray showed no acute cardiopulmonary findings. Urinalysis showed many bacteria and she was given empiric Macrobid. She was admitted for further management of acute urinary tract infection. Psychiatry was consulted for medical reconciliation off psychiatric medications.  Review of Systems:  Unable to obtain accurate review of systems due to patient's altered mental status.  Past Medical History:  Diagnosis Date  . Arm pain   . Chronic pain   . COPD (chronic obstructive pulmonary disease) ([place])   . Coronary artery disease   . Depression   . Dystonia   . Epilepsy ([place])   . High cholesterol   . Humerus fracture   . Hypothyroid   . Leukemia ([place])   . Myocardial infarct ([place])   . Schizophrenia ([place])   . Seizure [facility])     Past Surgical History:  Procedure Laterality Date  . EXTERNAL EAR SURGERY    . HUMERUS FRACTURE SURGERY    .  SHOULDER SURGERY      Social history:  reports that she has been smoking Cigarettes.  She has a 82.00 pack-year smoking history. She has never used smokeless tobacco. She reports that she does not drink alcohol or use drugs.  Ambulation: Patient is not a good historian but based on chart review seems to be ambulatory without assistance at baseline.  Allergies  Allergen Reactions  . Amoxicillin Anaphylaxis  . Penicillins Anaphylaxis    Has patient had a PCN reaction causing immediate rash, facial/tongue/throat swelling, SOB or lightheadedness with hypotension: yes Has patient had a PCN reaction causing severe rash involving mucus membranes or skin necrosis: no Has patient had a PCN reaction that required hospitalization: yes Has patient had a PCN reaction occurring within the last 10 years: no If all of the above answers are "NO", then may proceed with Cephalosporin use.   Marland Kitchen Phenytoin Other (See Comments)    Other Reaction: CNS Disorder  . Dilaudid [Hydromorphone Hcl] Other (See Comments)    Psychosis (per patient)  . Haldol [Haloperidol]     Psychosis per caregiver  . Morphine And Related Other (See Comments)    psychosis    No family history on file. Due to altered mental status, patient is not a good historian to provide family history.  Prior to Admission medications   Medication Sig Start Date End Date Taking? Authorizing Provider  acetaminophen-codeine (TYLENOL #3) 300-30 MG tablet Take 1 tablet by mouth 4 (four) times daily.    Yes Historical Provider, MD  acyclovir (ZOVIRAX)  200 MG capsule Take 200 mg by mouth 2 (two) times daily. 05/24/16  Yes Historical Provider, MD  amantadine (SYMMETREL) 100 MG capsule Take 100 mg by mouth daily. 05/16/16  Yes Historical Provider, MD  ARIPiprazole (ABILIFY) 30 MG tablet Take 30 mg by mouth daily. 07/18/16  Yes Historical Provider, MD  baclofen (LIORESAL) 10 MG tablet Take 10 mg by mouth 3 (three) times daily.  09/13/15  Yes Historical Provider,  MD  benztropine (COGENTIN) 2 MG tablet Take 1 tablet by mouth 3 (three) times daily. 01/06/14  Yes Historical Provider, MD  gabapentin (NEURONTIN) 800 MG tablet Take 800-1,600 mg by mouth 4 (four) times daily. Take 800mg  by mouth three times daily and 1600mg  by mouth at bedtime 07/18/16  Yes Historical Provider, MD  levothyroxine (SYNTHROID, LEVOTHROID) 88 MCG tablet Take 88 mcg by mouth daily before breakfast.   Yes Historical Provider, MD  LORazepam (ATIVAN) 1 MG tablet Take 1 tablet by mouth 2 (two) times daily.  03/12/14  Yes Historical Provider, MD  nystatin cream (MYCOSTATIN) Apply to affected area 2 times daily 07/29/16  Yes Charlesetta Shanks, MD  omeprazole (PRILOSEC) 40 MG capsule Take 40 mg by mouth daily.   Yes Historical Provider, MD  Oxycodone HCl 10 MG TABS Take 10 mg by mouth every 6 (six) hours as needed. For pain 06/13/16  Yes Historical Provider, MD  promethazine (PHENERGAN) 25 MG tablet Take 25 mg by mouth every 6 (six) hours as needed for nausea or vomiting.   Yes Historical Provider, MD  propranolol (INDERAL) 10 MG tablet Take 10 mg by mouth 2 (two) times daily. 05/16/16  Yes Historical Provider, MD  simvastatin (ZOCOR) 40 MG tablet Take by mouth daily.    Yes Historical Provider, MD  temazepam (RESTORIL) 30 MG capsule Take 30 mg by mouth at bedtime as needed for sleep.   Yes Historical Provider, MD  venlafaxine (EFFEXOR-XR) 150 MG 24 hr capsule Take 150 mg by mouth 2 (two) times daily. Reported on 10/27/2015   Yes Historical Provider, MD  VESICARE 10 MG tablet Take 10 mg by mouth daily. 06/13/16  Yes Historical Provider, MD  diphenhydrAMINE (BENADRYL) 25 mg capsule Take 25 mg by mouth every 6 (six) hours as needed for itching, allergies or sleep.    Historical Provider, MD  levofloxacin (LEVAQUIN) 500 MG tablet Take 1 tablet (500 mg total) by mouth daily. 07/29/16   Charlesetta Shanks, MD    Physical Exam: Vitals:   08/03/16 0812 08/03/16 0830 08/03/16 0900 08/03/16 0930  BP: (!) 128/59 (!)  124/93 (!) 114/51 123/72  Pulse: 79 81 80 76  Resp: 18 18 20 15   Temp: 98.7 F (37.1 C)     TempSrc: Oral     SpO2: 99% 100% 99% 98%    Constitutional: NAD, calm, comfortable Vitals:   08/03/16 0812 08/03/16 0830 08/03/16 0900 08/03/16 0930  BP: (!) 128/59 (!) 124/93 (!) 114/51 123/72  Pulse: 79 81 80 76  Resp: 18 18 20 15   Temp: 98.7 F (37.1 C)     TempSrc: Oral     SpO2: 99% 100% 99% 98%   Eyes: PERRL, lids and conjunctivae normal ENMT: Mucous membranes are moist. Posterior pharynx clear of any exudate or lesions.Normal dentition.  Neck: normal, supple, no masses, no thyromegaly Respiratory: clear to auscultation bilaterally, no wheezing, no crackles. Normal respiratory effort. No accessory muscle use.  Cardiovascular: Regular rate and rhythm, no murmurs / rubs / gallops. No extremity edema. 2+ pedal pulses. No carotid  bruits.  Abdomen: no tenderness, no masses palpated. No hepatosplenomegaly. Bowel sounds positive.  Musculoskeletal: no clubbing / cyanosis. No joint deformity upper and lower extremities. Good ROM, no contractures. Normal muscle tone.  Skin: no rashes, lesions, ulcers. No induration Neurologic: CN 2-12 grossly intact. Sensation intact, DTR normal. Strength 5/5 in all 4.  Psychiatric: Patient is confused but alert and able to respond back   Labs on Admission: I have personally reviewed following labs and imaging studies  CBC:  Recent Labs Lab 07/28/16 1410 07/28/16 2104 07/29/16 1117 07/29/16 1324 08/03/16 0603  WBC 24.3* 18.4* 13.5* 13.2* 20.2*  NEUTROABS 20.8* 14.1* 10.4* 10.0* 17.5*  HGB 13.5 13.1 12.7 12.9 15.3*  HCT 41.0 39.5 38.6 39.2 44.0  MCV 93.0 92.9 93.2 92.9 86.8  PLT 343 333 323 327 035*   Basic Metabolic Panel:  Recent Labs Lab 07/28/16 1410 07/29/16 1324 08/03/16 0851  NA 138 137 137  K 2.8* 3.5 3.0*  CL 103 105 95*  CO2 22 23 23   GLUCOSE 91 79 84  BUN 15 13 14   CREATININE 0.84 0.83 0.82  CALCIUM 8.9 8.4* 9.0  MG 2.4   --   --    GFR: CrCl cannot be calculated (Unknown ideal weight.). Liver Function Tests:  Recent Labs Lab 07/28/16 1410 08/03/16 0851  AST 72* 26  ALT 37 25  ALKPHOS 88 88  BILITOT 0.9 1.2  PROT 8.1 7.9  ALBUMIN 4.4 4.3   No results for input(s): LIPASE, AMYLASE in the last 168 hours. No results for input(s): AMMONIA in the last 168 hours. Coagulation Profile: No results for input(s): INR, PROTIME in the last 168 hours. Cardiac Enzymes:  Recent Labs Lab 07/29/16 1324  TROPONINI <0.03   BNP (last 3 results) No results for input(s): PROBNP in the last 8760 hours. HbA1C: No results for input(s): HGBA1C in the last 72 hours. CBG: No results for input(s): GLUCAP in the last 168 hours. Lipid Profile: No results for input(s): CHOL, HDL, LDLCALC, TRIG, CHOLHDL, LDLDIRECT in the last 72 hours. Thyroid Function Tests: No results for input(s): TSH, T4TOTAL, FREET4, T3FREE, THYROIDAB in the last 72 hours. Anemia Panel: No results for input(s): VITAMINB12, FOLATE, FERRITIN, TIBC, IRON, RETICCTPCT in the last 72 hours. Urine analysis:    Component Value Date/Time   COLORURINE YELLOW 08/03/2016 0510   APPEARANCEUR HAZY (A) 08/03/2016 0510   LABSPEC 1.025 08/03/2016 0510   PHURINE 5.0 08/03/2016 0510   GLUCOSEU NEGATIVE 08/03/2016 0510   HGBUR NEGATIVE 08/03/2016 0510   HGBUR negative 09/16/2009 0954   BILIRUBINUR NEGATIVE 08/03/2016 0510   KETONESUR 80 (A) 08/03/2016 0510   PROTEINUR 30 (A) 08/03/2016 0510   UROBILINOGEN 0.2 09/05/2014 2126   NITRITE NEGATIVE 08/03/2016 0510   LEUKOCYTESUR NEGATIVE 08/03/2016 0510   Sepsis Labs: @LABRCNTIP (procalcitonin:4,lacticidven:4) )No results found for this or any previous visit (from the past 240 hour(s)).   Radiological Exams on Admission: Dg Chest Portable 1 View Result Date: 08/03/2016 No active disease.    EKG: Independently reviewed. Sinus rhythm.  Assessment/Plan   Principal Problem:   Acute metabolic  encephalopathy / Acute lower UTI /  Leukocytosis - Altered mental status likely secondary to UTI versus multiple psychiatric medications - Started empiric Cipro while awaiting urine culture results   Active Problems:   Hypothyroidism - Continue Synthroid - TSH pending    Schizoaffective disorder, bipolar type ([place]) - Patient takes at home amantadine, Abilify, baclofen, Cogentin, gabapentin, Ativan, Effexor - For now we started Abilify, Cogentin and Ativan -  Appreciate psych recommendations on which medications are absolutely necessary for her psychiatric illness and also to determine if she is appropriate to return to assisted living facility    Hypokalemia - Supplemented    Hyperlipidemia - Continue Zocor    Essential hypertension - Continue propranolol    DVT prophylaxis: Lovenox subcutaneous Code Status: full code  Family Communication: no family at the bedside Disposition Plan: Admission to medical floor Consults called: psychiatry (I called psych at 11:58 am) Admission status: inpatient, patient requires treatment of urinary tract infection and altered mental status which is likely from UTI versus multiple psychiatric medications. I have requested psychiatric consultation to reconcile medications and determine which will medication is absolutely necessary for this patient in order to avoid potential overdose and delirium from multiple psychiatric medications. We started her on Cipro IV while awaiting urine culture results.   Leisa Lenz MD [facility] Pager [phone]  If 7PM-7AM, please contact night-coverage [email or web address] Password [facility]  08/03/2016, 11:52 AM

## 2016-08-03 NOTE — ED Notes (Signed)
Bed: XY81 Expected date:  Expected time:  Means of arrival:  Comments: [facility]/[facility]

## 2016-08-03 NOTE — ED Provider Notes (Signed)
[name] Provider Note   CSN: [id number] Arrival date & time: 08/03/16  7829  By signing my name below, I, [name]., attest that this documentation has been prepared under the direction and in the presence of [name], MD. Electronically signed: [name]., ED Scribe. 08/03/16. 3:29 AM.    History   Chief Complaint Chief Complaint  Patient presents with  . Medical Clearance  . Failure To Thrive   Level 5 caveat for psychiatric disorder  HPI Dominique Acosta is a 66 y.o. female with hx of schizophrenia who presents to the Emergency Department bib[facility] for a mental evaluation. Per [facility], pt being brought in with [facility] from independent living facility. Pts living conditions deemed unacceptable. Apartment covered in feces and spilled pill bottles. Pt speaking incoherently to herself and will not acknowledge questions from staff. Of note, pt was recently seen for related incident.    The history is provided by the police. History limited by: Psychiatric disorder. No language interpreter was used.    Past Medical History:  Diagnosis Date  . Arm pain   . Chronic pain   . COPD (chronic obstructive pulmonary disease) ([place])   . Coronary artery disease   . Depression   . Dystonia   . Epilepsy ([place])   . High cholesterol   . Humerus fracture   . Hypothyroid   . Leukemia ([place])   . Myocardial infarct ([place])   . Schizophrenia ([place])   . Seizure [facility])     Patient Active Problem List   Diagnosis Date Noted  . Hypokalemia 07/28/2016  . Schizoaffective disorder, bipolar type ([place]) 07/08/2016  . Delirium 09/05/2014  . Acute encephalopathy 09/05/2014  . Chronic pain syndrome 09/05/2014  . Diarrhea 02/14/2011  . Schizoaffective disorder, depressive type ([place]) 01/20/2011  . Seizure disorder ([place]) 09/02/2010  . Fracture, humerus, head 11/05/2009  . CIGARETTE SMOKER 09/05/2007  . CHRONIC AIRWAY OBSTRUCTION NEC 07/11/2007  . Hypothyroidism  06/08/2006  . Hyperlipemia 06/08/2006    Past Surgical History:  Procedure Laterality Date  . EXTERNAL EAR SURGERY    . HUMERUS FRACTURE SURGERY    . SHOULDER SURGERY      OB History    No data available       Home Medications    Prior to Admission medications   Medication Sig Start Date End Date Taking? Authorizing Provider  acetaminophen-codeine (TYLENOL #3) 300-30 MG tablet Take 1 tablet by mouth 4 (four) times daily.     Historical Provider, MD  acyclovir (ZOVIRAX) 200 MG capsule Take 200 mg by mouth 2 (two) times daily. 05/24/16   Historical Provider, MD  albuterol (PROVENTIL,VENTOLIN) 90 MCG/ACT inhaler Inhale 2 puffs into the lungs as directed. 1-2 puffs up to three times a day prn Patient not taking: Reported on 07/29/2016 11/22/10   Blane Ohara McDiarmid, MD  amantadine (SYMMETREL) 100 MG capsule Take 100 mg by mouth daily. 05/16/16   Historical Provider, MD  ARIPiprazole (ABILIFY) 20 MG tablet Take 1 tablet (20 mg total) by mouth daily. Patient not taking: Reported on 07/28/2016 06/26/16   Shuvon B Rankin, NP  ARIPiprazole (ABILIFY) 30 MG tablet Take 30 mg by mouth daily. 07/18/16   Historical Provider, MD  baclofen (LIORESAL) 10 MG tablet Take 10 mg by mouth 3 (three) times daily.  09/13/15   Historical Provider, MD  benztropine (COGENTIN) 2 MG tablet Take 1 tablet by mouth 3 (three) times daily. 01/06/14   Historical Provider, MD  darifenacin (ENABLEX) 7.5  MG 24 hr tablet Take 1 tablet (7.5 mg total) by mouth daily. Patient not taking: Reported on 07/29/2016 06/26/16   Shuvon B Rankin, NP  diphenhydrAMINE (BENADRYL) 25 mg capsule Take 25 mg by mouth every 6 (six) hours as needed for itching, allergies or sleep.    Historical Provider, MD  gabapentin (NEURONTIN) 800 MG tablet Take 800-1,600 mg by mouth 4 (four) times daily. Take 800mg  by mouth three times daily and 1600mg  by mouth at bedtime 07/18/16   Historical Provider, MD  levofloxacin (LEVAQUIN) 500 MG tablet Take 1 tablet (500 mg  total) by mouth daily. 07/29/16   Jola Schmidt, MD  levofloxacin (LEVAQUIN) 500 MG tablet Take 1 tablet (500 mg total) by mouth daily. 07/29/16   Charlesetta Shanks, MD  levothyroxine (SYNTHROID) 150 MCG tablet Take 1 tablet (150 mcg total) by mouth daily before breakfast. 07/29/16   Charlesetta Shanks, MD  LORazepam (ATIVAN) 1 MG tablet Take 1 tablet by mouth 2 (two) times daily.  03/12/14   Historical Provider, MD  nystatin cream (MYCOSTATIN) Apply to affected area 2 times daily 07/29/16   Charlesetta Shanks, MD  Oxycodone HCl 10 MG TABS Take 10 mg by mouth every 6 (six) hours as needed. 06/13/16   Historical Provider, MD  promethazine (PHENERGAN) 25 MG tablet Take 25 mg by mouth every 6 (six) hours as needed for nausea or vomiting.    Historical Provider, MD  propranolol (INDERAL) 10 MG tablet Take 10 mg by mouth 2 (two) times daily. 05/16/16   Historical Provider, MD  simvastatin (ZOCOR) 40 MG tablet Take by mouth daily.     Historical Provider, MD  temazepam (RESTORIL) 15 MG capsule Take 1 capsule (15 mg total) by mouth at bedtime as needed for sleep. 07/09/16   Shuvon B Rankin, NP  temazepam (RESTORIL) 30 MG capsule Take 30 mg by mouth at bedtime.    Historical Provider, MD  venlafaxine (EFFEXOR-XR) 150 MG 24 hr capsule Take 150 mg by mouth 2 (two) times daily. Reported on 10/27/2015    Historical Provider, MD  VESICARE 10 MG tablet Take 10 mg by mouth daily. 06/13/16   Historical Provider, MD    Family History No family history on file.  Social History Social History  Substance Use Topics  . Smoking status: Current Every Day Smoker    Packs/day: 2.00    Years: 41.00    Types: Cigarettes  . Smokeless tobacco: Never Used  . Alcohol use No     Allergies   Amoxicillin; Penicillins; Phenytoin; Dilaudid [hydromorphone hcl]; Haldol [haloperidol]; and Morphine and related   Review of Systems Review of Systems  Unable to perform ROS: Psychiatric disorder     Physical Exam Updated Vital Signs BP (!)  157/90   Pulse 84   Resp (!) 23   SpO2 95%  Temp - 98 Physical Exam  CONSTITUTIONAL: Disheveled, mildly agitated HEAD: Normocephalic/atraumatic EYES: EOMI/PERRL ENMT: Mucous membranes dry NECK: supple no meningeal signs SPINE/BACK:entire spine nontender CV: S1/S2 noted, no murmurs/rubs/gallops noted LUNGS: Lungs are clear to auscultation bilaterally, no apparent distress Chest - bruising noted to right chest wall, no crepitus ABDOMEN: soft, nontender, no rebound or guarding, bowel sounds noted throughout abdomen GU:no cva tenderness NEURO: Pt is awake/alert, moves all extremitiesx4.  No facial droop.   EXTREMITIES: pulses normal/equal, full ROM SKIN: warm, color normal, pt with dried feces noted to skin. Skin breakdown/erythema noted to buttocks  PSYCH:Pt speaking incoherently, appears to be speaking to herself. She will follow commands  ED Treatments / Results   DIAGNOSTIC STUDIES: Oxygen Saturation is 95% on RA, normal by my interpretation.   COORDINATION OF CARE: 3:29 AM-Discussed next steps with pt. Pt verbalized understanding and is agreeable with the plan.    Labs (all labs ordered are listed, but only abnormal results are displayed) Labs Reviewed  CBC WITH DIFFERENTIAL/PLATELET - Abnormal; Notable for the following:       Result Value   WBC 20.2 (*)    Hemoglobin 15.3 (*)    Platelets 459 (*)    Neutro Abs 17.5 (*)    All other components within normal limits  RAPID URINE DRUG SCREEN, HOSP PERFORMED - Abnormal; Notable for the following:    Benzodiazepines POSITIVE (*)    All other components within normal limits  URINALYSIS, ROUTINE W REFLEX MICROSCOPIC - Abnormal; Notable for the following:    APPearance HAZY (*)    Ketones, ur 80 (*)    Protein, ur 30 (*)    Bacteria, UA MANY (*)    Squamous Epithelial / LPF 0-5 (*)    All other components within normal limits  URINE CULTURE  ACETAMINOPHEN LEVEL  ETHANOL  COMPREHENSIVE METABOLIC PANEL  SALICYLATE  LEVEL    EKG  EKG Interpretation  Date/Time:  Wednesday August 03 2016 03:59:00 EDT Ventricular Rate:  85 PR Interval:    QRS Duration: 103 QT Interval:  408 QTC Calculation: 483 R Axis:   28 Text Interpretation:  Sinus rhythm Interpretation limited secondary to artifact Confirmed by Christy Gentles  MD, [place] ([id number]) on 08/03/2016 4:22:35 AM       Radiology Dg Chest Portable 1 View  Result Date: 08/03/2016 CLINICAL DATA:  Altered mental status EXAM: PORTABLE CHEST 1 VIEW COMPARISON:  Chest radiograph 08/06/2016 FINDINGS: Unchanged cardiomediastinal contours with shallow lung inflation. No focal airspace consolidation or pulmonary edema. Right shoulder arthroplasty again noted. IMPRESSION: No active disease. Electronically Signed   By: Ulyses Jarred M.D.   On: 08/03/2016 05:32    Procedures Procedures (including critical care time)  Medications Ordered in ED Medications  nitrofurantoin (macrocrystal-monohydrate) (MACROBID) capsule 100 mg (not administered)  ARIPiprazole (ABILIFY) tablet 20 mg (not administered)  benztropine (COGENTIN) tablet 2 mg (not administered)  levothyroxine (SYNTHROID, LEVOTHROID) tablet 150 mcg (not administered)  LORazepam (ATIVAN) tablet 1 mg (not administered)     Initial Impression / Assessment and Plan / ED Course  I have reviewed the triage vital signs and the nursing notes.  Pertinent labs & imaging results that were available during my care of the patient were reviewed by me and considered in my medical decision making (see chart for details).     7:51 AM Pt in the ED from independent living Apparently her living conditions are unacceptable, she was covered in feces with pills everywhere Pt has h/o schizophrenia She is currently awake/alert but still appears to be having conversations with herself However, suspect this may be near her baseline She is noted to have UTI, antibiotics ordered  She has had multiple ED visits/psych evaluations At  this time, I feel she likely needs placement for more acute care  At signout to dr pickering, f/u on pending labs and discuss with SW/case management about her placement   Final Clinical Impressions(s) / ED Diagnoses   Final diagnoses:  Paranoid schizophrenia ([place])  Acute cystitis without hematuria    New Prescriptions New Prescriptions   No medications on file   I personally performed the services described in this documentation, which was scribed in my  presence. The recorded information has been reviewed and is accurate.       [name], MD 08/03/16 [phone]

## 2016-08-03 NOTE — ED Provider Notes (Signed)
  Physical Exam  BP 123/72   Pulse 76   Temp 98.7 F (37.1 C) (Oral)   Resp 15   SpO2 98%   Physical Exam  ED Course  Procedures  MDM Received patient in signout. Has been reportedly more confused and not acting well at home. Has history of schizoaffective disorder. Has apparent urinary tract infection and white count is elevated. Will admit to hospitalist for likely higher level of care placement and antibiotics. Culture sent.      Davonna Belling, MD 08/03/16 1000

## 2016-08-04 DIAGNOSIS — F17210 Nicotine dependence, cigarettes, uncomplicated: Secondary | ICD-10-CM

## 2016-08-04 DIAGNOSIS — D72829 Elevated white blood cell count, unspecified: Secondary | ICD-10-CM

## 2016-08-04 LAB — BASIC METABOLIC PANEL
Anion gap: 11 (ref 5–15)
BUN: 15 mg/dL (ref 6–20)
CO2: 24 mmol/L (ref 22–32)
CREATININE: 0.85 mg/dL (ref 0.44–1.00)
Calcium: 8.6 mg/dL — ABNORMAL LOW (ref 8.9–10.3)
Chloride: 101 mmol/L (ref 101–111)
GFR calc Af Amer: >60 mL/min (ref >60)
Glucose, Bld: 89 mg/dL (ref 65–99)
POTASSIUM: 3.2 mmol/L — AB (ref 3.5–5.1)
SODIUM: 136 mmol/L (ref 135–145)

## 2016-08-04 LAB — URINE CULTURE: Culture: NO GROWTH

## 2016-08-04 LAB — CBC
HEMATOCRIT: 44.4 % (ref 36.0–46.0)
Hemoglobin: 15.2 g/dL — ABNORMAL HIGH (ref 12.0–15.0)
MCH: 31.1 pg (ref 26.0–34.0)
MCHC: 34.2 g/dL (ref 30.0–36.0)
MCV: 90.8 fL (ref 78.0–100.0)
Platelets: 433 10*3/uL — ABNORMAL HIGH (ref 150–400)
RBC: 4.89 MIL/uL (ref 3.87–5.11)
RDW: 14.2 % (ref 11.5–15.5)
WBC: 12.9 10*3/uL — AB (ref 4.0–10.5)

## 2016-08-04 LAB — GLUCOSE, CAPILLARY: GLUCOSE-CAPILLARY: 102 mg/dL — AB (ref 65–99)

## 2016-08-04 LAB — TSH: TSH: 42.352 u[IU]/mL — ABNORMAL HIGH (ref 0.350–4.500)

## 2016-08-04 MED ORDER — LORAZEPAM 2 MG/ML IJ SOLN
1.0000 mg | INTRAMUSCULAR | Status: DC | PRN
  Administered 2016-08-04 – 2016-08-05 (×4): 1 mg via INTRAVENOUS
  Filled 2016-08-04 (×4): qty 1

## 2016-08-04 MED ORDER — ARIPIPRAZOLE 10 MG PO TABS
20.0000 mg | ORAL_TABLET | Freq: Every day | ORAL | Status: DC
  Administered 2016-08-05 – 2016-08-06 (×2): 20 mg via ORAL
  Filled 2016-08-04 (×2): qty 2

## 2016-08-04 MED ORDER — BENZTROPINE MESYLATE 1 MG PO TABS
1.0000 mg | ORAL_TABLET | Freq: Two times a day (BID) | ORAL | Status: DC
  Administered 2016-08-04 – 2016-08-06 (×4): 1 mg via ORAL
  Filled 2016-08-04 (×5): qty 1

## 2016-08-04 MED ORDER — LORAZEPAM 0.5 MG PO TABS
0.5000 mg | ORAL_TABLET | Freq: Once | ORAL | Status: AC
  Administered 2016-08-04: 0.5 mg via ORAL
  Filled 2016-08-04: qty 1

## 2016-08-04 MED ORDER — CIPROFLOXACIN HCL 500 MG PO TABS: 500.0000 mg | ORAL_TABLET | Freq: Two times a day (BID) | ORAL | Status: DC

## 2016-08-04 MED ORDER — CIPROFLOXACIN IN D5W 200 MG/100ML IV SOLN
200.0000 mg | Freq: Two times a day (BID) | INTRAVENOUS | Status: DC
Start: 2016-08-04 — End: 2016-08-06
  Administered 2016-08-04 – 2016-08-05 (×4): 200 mg via INTRAVENOUS
  Filled 2016-08-04 (×5): qty 100

## 2016-08-04 NOTE — Consult Note (Signed)
[place] [facility] Consult   Reason for Consult:  Medication management for schizoaffective disorder Referring Physician:  Dr. Cathlean Sauer Patient Identification: Dominique Acosta MRN:  [id number] Principal Diagnosis: Acute metabolic encephalopathy Diagnosis:   Patient Active Problem List   Diagnosis Date Noted  . Acute metabolic encephalopathy [A83.41] 08/03/2016  . Acute lower UTI [N39.0] 08/03/2016  . Hypokalemia [E87.6] 08/03/2016  . Leukocytosis [D72.829] 08/03/2016  . Benign essential HTN [I10] 08/03/2016  . Schizoaffective disorder, bipolar type ([place]) [F25.0] 07/08/2016  . Hypothyroidism [E03.9] 06/08/2006    Total Time spent with patient: 45 minutes  Subjective:   Dominique Acosta is a 66 y.o. female patient admitted with altered mental status.  HPI:  Dominique Acosta is a 66 y.o. female, seen, chart reviewed and case discussed with the unit LCSW. Patient is a poor historian and does not respond to questions most of the time. It is difficult to understand even though she speaks few words during this visit. Patient knows her first name and last name but does not know she has been in hospital because she feels she was at her mom's home when asked where she has been at this current.  Review of medical records indicated patient has been diagnosed with a schizoaffective disorder, bipolar disorder, depression and her medication has been adjusted by outpatient physicians. Patient abilify has increased from 20-30 mg and her benztropine was increased from 1 mg twice daily to 2 mg twice daily and anticholinergic effect may be cause for her altered mental status so would like to change them to the low a level for controlling her schizoaffective disorder, bipolar disorder and at the same time expecting to cleared her altered mental status. Will ask hospitalist to the other causes of altered mental status and continue medical investigation for possible urinary tract infection, electrolyte abnormalities and  possible other infections. Patient cannot care for herself and she has a caregiver and reportedly has a place to live before coming to the hospital. Will ask patient caregiver or her sister to provide her baseline functional information and possible need for out-of-home placement during next evaluation.  Medical history: Patient with medical history significant for schizoaffective disorder, bipolar disorder and depression, hypertension, dyslipidemia, apparently lives in assisted living facility and does have a caregiver. Because of her altered mental status she's not a good historian. History obtained from ED physician as well as review of patient's chart. Patient apparently more confused over past 24 hours prior to this admission but no reports of fevers or respiratory distress. Patient has no complaints at this time off abdominal pain, nausea or vomiting. No reports of chest pain.  ED Course: In ED, blood pressure was stable, patient was afebrile and her oxygen saturation was 95% on room air. Blood work was significant for white blood cell count of 20.2, potassium 3 this was supplemented in ED. Chest x-ray showed no acute cardiopulmonary findings. Urinalysis showed many bacteria and she was given empiric Macrobid. She was admitted for further management of acute urinary tract infection. Psychiatry was consulted for medical reconciliation off psychiatric medications.  Past Psychiatric History: Patient has been diagnosed with a schizoaffective disorder, bipolar disorder and depression and has been receiving outpatient medication management and she has no recent acute psychiatric hospitalization at behavioral Center. Patient previous acute psychiatric hospitalization going back to 2003 and 2004 as per electronic medical records.  Risk to Self: Is patient at risk for suicide?: No, but patient needs Medical Clearance Risk to Others:   Prior Inpatient Therapy:  Prior Outpatient Therapy:    Past Medical  History:  Past Medical History:  Diagnosis Date  . Arm pain   . Chronic pain   . COPD (chronic obstructive pulmonary disease) ([place])   . Coronary artery disease   . Depression   . Dystonia   . Epilepsy ([place])   . High cholesterol   . Humerus fracture   . Hypothyroid   . Leukemia ([place])   . Myocardial infarct ([place])   . Schizophrenia ([place])   . Seizure [facility])     Past Surgical History:  Procedure Laterality Date  . EXTERNAL EAR SURGERY    . HUMERUS FRACTURE SURGERY    . SHOULDER SURGERY     Family History: No family history on file. Family Psychiatric  History: Unable to obtain Social History:  History  Alcohol Use No     History  Drug Use No    Social History   Social History  . Marital status: Single    Spouse name: N/A  . Number of children: N/A  . Years of education: N/A   Social History Main Topics  . Smoking status: Current Every Day Smoker    Packs/day: 2.00    Years: 41.00    Types: Cigarettes  . Smokeless tobacco: Never Used  . Alcohol use No  . Drug use: No  . Sexual activity: No   Other Topics Concern  . None   Social History Narrative  . None   Additional Social History:    Allergies:   Allergies  Allergen Reactions  . Amoxicillin Anaphylaxis  . Penicillins Anaphylaxis    Has patient had a PCN reaction causing immediate rash, facial/tongue/throat swelling, SOB or lightheadedness with hypotension: yes Has patient had a PCN reaction causing severe rash involving mucus membranes or skin necrosis: no Has patient had a PCN reaction that required hospitalization: yes Has patient had a PCN reaction occurring within the last 10 years: no If all of the above answers are "NO", then may proceed with Cephalosporin use.   Marland Kitchen Phenytoin Other (See Comments)    Other Reaction: CNS Disorder  . Dilaudid [Hydromorphone Hcl] Other (See Comments)    Psychosis (per patient)  . Haldol [Haloperidol]     Psychosis per caregiver  . Morphine And Related Other  (See Comments)    psychosis    Labs:  Results for orders placed or performed during the hospital encounter of 08/03/16 (from the past 48 hour(s))  Rapid urine drug screen (hospital performed)     Status: Abnormal   Collection Time: 08/03/16  5:10 AM  Result Value Ref Range   Opiates NONE DETECTED NONE DETECTED   Cocaine NONE DETECTED NONE DETECTED   Benzodiazepines POSITIVE (A) NONE DETECTED   Amphetamines NONE DETECTED NONE DETECTED   Tetrahydrocannabinol NONE DETECTED NONE DETECTED   Barbiturates NONE DETECTED NONE DETECTED    Comment:        DRUG SCREEN FOR MEDICAL PURPOSES ONLY.  IF CONFIRMATION IS NEEDED FOR ANY PURPOSE, NOTIFY LAB WITHIN 5 DAYS.        LOWEST DETECTABLE LIMITS FOR URINE DRUG SCREEN Drug Class       Cutoff (ng/mL) Amphetamine      1000 Barbiturate      200 Benzodiazepine   476 Tricyclics       546 Opiates          300 Cocaine          300 THC  50   Urinalysis, Routine w reflex microscopic     Status: Abnormal   Collection Time: 08/03/16  5:10 AM  Result Value Ref Range   Color, Urine YELLOW YELLOW   APPearance HAZY (A) CLEAR   Specific Gravity, Urine 1.025 1.005 - 1.030   pH 5.0 5.0 - 8.0   Glucose, UA NEGATIVE NEGATIVE mg/dL   Hgb urine dipstick NEGATIVE NEGATIVE   Bilirubin Urine NEGATIVE NEGATIVE   Ketones, ur 80 (A) NEGATIVE mg/dL   Protein, ur 30 (A) NEGATIVE mg/dL   Nitrite NEGATIVE NEGATIVE   Leukocytes, UA NEGATIVE NEGATIVE   RBC / HPF 0-5 0 - 5 RBC/hpf   WBC, UA 6-30 0 - 5 WBC/hpf   Bacteria, UA MANY (A) NONE SEEN   Squamous Epithelial / LPF 0-5 (A) NONE SEEN   Mucous PRESENT    Hyaline Casts, UA PRESENT   Urine culture     Status: None   Collection Time: 08/03/16  5:10 AM  Result Value Ref Range   Specimen Description URINE, RANDOM    Special Requests NONE    Culture      NO GROWTH Performed at [facility], [address] [place], [place] [address]    Report Status 08/04/2016 FINAL   CBC with  Differential/Platelet     Status: Abnormal   Collection Time: 08/03/16  6:03 AM  Result Value Ref Range   WBC 20.2 (H) 4.0 - 10.5 K/uL   RBC 5.07 3.87 - 5.11 MIL/uL   Hemoglobin 15.3 (H) 12.0 - 15.0 g/dL   HCT 44.0 36.0 - 46.0 %   MCV 86.8 78.0 - 100.0 fL   MCH 30.2 26.0 - 34.0 pg   MCHC 34.8 30.0 - 36.0 g/dL   RDW 14.2 11.5 - 15.5 %   Platelets 459 (H) 150 - 400 K/uL   Neutrophils Relative % 87 %   Neutro Abs 17.5 (H) 1.7 - 7.7 K/uL   Lymphocytes Relative 9 %   Lymphs Abs 1.8 0.7 - 4.0 K/uL   Monocytes Relative 4 %   Monocytes Absolute 0.8 0.1 - 1.0 K/uL   Eosinophils Relative 0 %   Eosinophils Absolute 0.1 0.0 - 0.7 K/uL   Basophils Relative 0 %   Basophils Absolute 0.0 0.0 - 0.1 K/uL  Acetaminophen level     Status: Abnormal   Collection Time: 08/03/16  8:51 AM  Result Value Ref Range   Acetaminophen (Tylenol), Serum <10 (L) 10 - 30 ug/mL    Comment:        THERAPEUTIC CONCENTRATIONS VARY SIGNIFICANTLY. A RANGE OF 10-30 ug/mL MAY BE AN EFFECTIVE CONCENTRATION FOR MANY PATIENTS. HOWEVER, SOME ARE BEST TREATED AT CONCENTRATIONS OUTSIDE THIS RANGE. ACETAMINOPHEN CONCENTRATIONS >150 ug/mL AT 4 HOURS AFTER INGESTION AND >50 ug/mL AT 12 HOURS AFTER INGESTION ARE OFTEN ASSOCIATED WITH TOXIC REACTIONS.   Ethanol     Status: None   Collection Time: 08/03/16  8:51 AM  Result Value Ref Range   Alcohol, Ethyl (B) <5 <5 mg/dL    Comment:        LOWEST DETECTABLE LIMIT FOR SERUM ALCOHOL IS 5 mg/dL FOR MEDICAL PURPOSES ONLY   Comprehensive metabolic panel     Status: Abnormal   Collection Time: 08/03/16  8:51 AM  Result Value Ref Range   Sodium 137 135 - 145 mmol/L   Potassium 3.0 (L) 3.5 - 5.1 mmol/L   Chloride 95 (L) 101 - 111 mmol/L   CO2 23 22 - 32 mmol/L  Glucose, Bld 84 65 - 99 mg/dL   BUN 14 6 - 20 mg/dL   Creatinine, Ser 3.08 0.44 - 1.00 mg/dL   Calcium 9.0 8.9 - 65.7 mg/dL   Total Protein 7.9 6.5 - 8.1 g/dL   Albumin 4.3 3.5 - 5.0 g/dL   AST 26 15 - 41 U/L    ALT 25 14 - 54 U/L   Alkaline Phosphatase 88 38 - 126 U/L   Total Bilirubin 1.2 0.3 - 1.2 mg/dL   GFR calc non Af Amer >60 >60 mL/min   GFR calc Af Amer >60 >60 mL/min    Comment: (NOTE) The eGFR has been calculated using the CKD EPI equation. This calculation has not been validated in all clinical situations. eGFR's persistently <60 mL/min signify possible Chronic Kidney Disease.    Anion gap 19 (H) 5 - 15  Salicylate level     Status: None   Collection Time: 08/03/16  8:51 AM  Result Value Ref Range   Salicylate Lvl <7.0 2.8 - 30.0 mg/dL  Comprehensive metabolic panel     Status: Abnormal   Collection Time: 08/03/16  1:00 PM  Result Value Ref Range   Sodium 135 135 - 145 mmol/L   Potassium 3.1 (L) 3.5 - 5.1 mmol/L   Chloride 97 (L) 101 - 111 mmol/L   CO2 22 22 - 32 mmol/L   Glucose, Bld 101 (H) 65 - 99 mg/dL   BUN 14 6 - 20 mg/dL   Creatinine, Ser 8.46 0.44 - 1.00 mg/dL   Calcium 8.9 8.9 - 96.2 mg/dL   Total Protein 7.7 6.5 - 8.1 g/dL   Albumin 4.2 3.5 - 5.0 g/dL   AST 23 15 - 41 U/L   ALT 22 14 - 54 U/L   Alkaline Phosphatase 94 38 - 126 U/L   Total Bilirubin 1.1 0.3 - 1.2 mg/dL   GFR calc non Af Amer >60 >60 mL/min   GFR calc Af Amer >60 >60 mL/min    Comment: (NOTE) The eGFR has been calculated using the CKD EPI equation. This calculation has not been validated in all clinical situations. eGFR's persistently <60 mL/min signify possible Chronic Kidney Disease.    Anion gap 16 (H) 5 - 15  Magnesium     Status: None   Collection Time: 08/03/16  1:00 PM  Result Value Ref Range   Magnesium 2.1 1.7 - 2.4 mg/dL  Phosphorus     Status: None   Collection Time: 08/03/16  1:00 PM  Result Value Ref Range   Phosphorus 2.8 2.5 - 4.6 mg/dL  CBC WITH DIFFERENTIAL     Status: Abnormal   Collection Time: 08/03/16  1:00 PM  Result Value Ref Range   WBC 17.4 (H) 4.0 - 10.5 K/uL   RBC 5.16 (H) 3.87 - 5.11 MIL/uL   Hemoglobin 16.1 (H) 12.0 - 15.0 g/dL   HCT 95.2 (H) 84.1 -  46.0 %   MCV 90.7 78.0 - 100.0 fL   MCH 31.2 26.0 - 34.0 pg   MCHC 34.4 30.0 - 36.0 g/dL   RDW 32.4 40.1 - 02.7 %   Platelets 451 (H) 150 - 400 K/uL   Neutrophils Relative % 82 %   Lymphocytes Relative 14 %   Monocytes Relative 3 %   Eosinophils Relative 1 %   Basophils Relative 0 %   Neutro Abs 14.3 (H) 1.7 - 7.7 K/uL   Lymphs Abs 2.4 0.7 - 4.0 K/uL   Monocytes Absolute 0.5 0.1 -  1.0 K/uL   Eosinophils Absolute 0.2 0.0 - 0.7 K/uL   Basophils Absolute 0.0 0.0 - 0.1 K/uL   Smear Review MORPHOLOGY UNREMARKABLE   Glucose, capillary     Status: Abnormal   Collection Time: 08/04/16  7:38 AM  Result Value Ref Range   Glucose-Capillary 102 (H) 65 - 99 mg/dL  Basic metabolic panel     Status: Abnormal   Collection Time: 08/04/16  8:01 AM  Result Value Ref Range   Sodium 136 135 - 145 mmol/L   Potassium 3.2 (L) 3.5 - 5.1 mmol/L   Chloride 101 101 - 111 mmol/L   CO2 24 22 - 32 mmol/L   Glucose, Bld 89 65 - 99 mg/dL   BUN 15 6 - 20 mg/dL   Creatinine, Ser 7.38 0.44 - 1.00 mg/dL   Calcium 8.6 (L) 8.9 - 10.3 mg/dL   GFR calc non Af Amer >60 >60 mL/min   GFR calc Af Amer >60 >60 mL/min    Comment: (NOTE) The eGFR has been calculated using the CKD EPI equation. This calculation has not been validated in all clinical situations. eGFR's persistently <60 mL/min signify possible Chronic Kidney Disease.    Anion gap 11 5 - 15  CBC     Status: Abnormal   Collection Time: 08/04/16 11:17 AM  Result Value Ref Range   WBC 12.9 (H) 4.0 - 10.5 K/uL   RBC 4.89 3.87 - 5.11 MIL/uL   Hemoglobin 15.2 (H) 12.0 - 15.0 g/dL   HCT 89.3 68.3 - 05.8 %   MCV 90.8 78.0 - 100.0 fL   MCH 31.1 26.0 - 34.0 pg   MCHC 34.2 30.0 - 36.0 g/dL   RDW 62.9 51.5 - 06.0 %   Platelets 433 (H) 150 - 400 K/uL  TSH     Status: Abnormal   Collection Time: 08/04/16 11:17 AM  Result Value Ref Range   TSH 42.352 (H) 0.350 - 4.500 uIU/mL    Comment: Performed by a 3rd Generation assay with a functional sensitivity of  <=0.01 uIU/mL.    Current Facility-Administered Medications  Medication Dose Route Frequency Provider Last Rate Last Dose  . 0.9 %  sodium chloride infusion   Intravenous Continuous Alison Murray, MD 50 mL/hr at 08/04/16 1124    . acetaminophen (TYLENOL) tablet 650 mg  650 mg Oral Q6H PRN Alison Murray, MD       Or  . acetaminophen (TYLENOL) suppository 650 mg  650 mg Rectal Q6H PRN Alison Murray, MD      . ARIPiprazole (ABILIFY) tablet 30 mg  30 mg Oral Daily Alison Murray, MD   30 mg at 08/04/16 0953  . benztropine (COGENTIN) tablet 2 mg  2 mg Oral BID Zadie Rhine, MD   2 mg at 08/04/16 0953  . ciprofloxacin (CIPRO) IVPB 200 mg  200 mg Intravenous Q12H Mauricio Annett Gula, MD   Stopped at 08/04/16 1224  . enoxaparin (LOVENOX) injection 40 mg  40 mg Subcutaneous Q24H Alison Murray, MD   40 mg at 08/03/16 2133  . levothyroxine (SYNTHROID, LEVOTHROID) tablet 88 mcg  88 mcg Oral QAC breakfast Alison Murray, MD   88 mcg at 08/04/16 0750  . LORazepam (ATIVAN) tablet 1 mg  1 mg Oral BID Zadie Rhine, MD   1 mg at 08/04/16 0953  . ondansetron (ZOFRAN) tablet 4 mg  4 mg Oral Q6H PRN Alison Murray, MD       Or  .  ondansetron (ZOFRAN) injection 4 mg  4 mg Intravenous Q6H PRN Robbie Lis, MD      . propranolol (INDERAL) tablet 10 mg  10 mg Oral BID Robbie Lis, MD   10 mg at 08/04/16 0953  . simvastatin (ZOCOR) tablet 40 mg  40 mg Oral q1800 Robbie Lis, MD   40 mg at 08/03/16 1716    Musculoskeletal: Strength & Muscle Tone: decreased Gait & Station: unable to stand Patient leans: N/A  Psychiatric Specialty Exam: Physical Exam as per history and physical   ROS unable to open secondary to altered mental status   Blood pressure (!) 117/56, pulse 63, temperature 98.7 F (37.1 C), temperature source Oral, resp. rate 18, height 5' (1.524 m), weight 83.7 kg (184 lb 9.6 oz), SpO2 96 %.Body mass index is 36.05 kg/m.  General Appearance: Bizarre and Guarded  Eye Contact:  Fair   Speech:  Blocked, Slow and Slurred  Volume:  Decreased  Mood:  Depressed  Affect:  Blunt  Thought Process:  Disorganized  Orientation:  Full (Time, Place, and Person)  Thought Content:  Illogical  Suicidal Thoughts:  No  Homicidal Thoughts:  No  Memory:  Immediate;   Fair Recent;   Poor Remote;   Poor  Judgement:  Poor  Insight:  Shallow  Psychomotor Activity:  Decreased  Concentration:  Concentration: Fair and Attention Span: Poor  Recall:  Poor  Fund of Knowledge:  Poor  Language:  Fair  Akathisia:  Negative  Handed:  Right, seen eating food with right hand and able to use fork properly  AIMS (if indicated):     Assets:  Others:  TBD  ADL's:  Impaired  Cognition:  Impaired,  Moderate  Sleep:        Treatment Plan Summary: 66 years old female with a schizoaffective disorder, hypothyroidism presented with altered mental status. Review of breath patient psychiatric medications indicated recent increase her Abilify and benztropine, possibly which may be cause for anticholinergic delirium.  Schizoaffective disorder Acute encephalopathy  Unit LCSW will contact patient care giver and also sister for collateral information and possible information regarding outpatient medication services  Treatment plan: Will decrease Abilify to 20 mg daily instead 30 mg and also decrease benztropine 1 mg twice daily from 2 mg twice daily to prevent anticholinergic delirium  We'll ask hospitalist to continue investigating other causes of delirium  Appreciate psychiatric consultation and follow up as clinically required Please contact [phone] or [phone] if needs further assistance  Disposition: Supportive therapy provided about ongoing stressors.  Ambrose Finland, MD 08/04/2016 12:55 PM

## 2016-08-04 NOTE — Progress Notes (Signed)
Date:  August 04, 2016 Chart reviewed for concurrent status and case management needs. Will continue to follow patient progress. md [facility]S [facility]ERR[facility]. Discharge Planning: following for needs Expected discharge date: [id number] Madalyn Legner, BSN, [place], [place]

## 2016-08-04 NOTE — Evaluation (Signed)
Physical Therapy Evaluation Patient Details Name: Dominique Acosta MRN: [id number] DOB: [birth year] Today's Date: 08/04/2016   History of Present Illness  66 y.o. female with medical history significant for schizoaffective disorder, bipolar disorder and depression, hypertension, dyslipidemia, apparently lives in assisted living facility and does have a caregiver.  Pt admitted with AMS and UTI  Clinical Impression  Pt admitted with above diagnosis. Pt currently with functional limitations due to the deficits listed below (see PT Problem List).  Pt unsteady and presents as high fall risk however this may improve with improvement in cognition. Pt will benefit from skilled PT to increase their independence and safety with mobility to allow discharge to the venue listed below.   Recommend 24/7 supervision for safety and possibly HHPT, depending on progress.     Follow Up Recommendations Supervision/Assistance - 24 hour;Home health PT    Equipment Recommendations  Rolling walker with 5" wheels (TBA)    Recommendations for Other Services       Precautions / Restrictions Precautions Precautions: Fall      Mobility  Bed Mobility Overal bed mobility: Needs Assistance Bed Mobility: Supine to Sit;Sit to Supine     Supine to sit: Min assist Sit to supine: Min guard   General bed mobility comments: assist mostly for cues  Transfers Overall transfer level: Needs assistance Equipment used: None Transfers: Sit to/from Stand Sit to Stand: Min assist         General transfer comment: assist to steady upon rise  Ambulation/Gait Ambulation/Gait assistance: Min guard Ambulation Distance (Feet): 280 Feet Assistive device: None Gait Pattern/deviations: Step-through pattern;Decreased stride length;Narrow base of support;Drifts right/left     General Gait Details: pt pushed IV pole for more support, pt very unsteady without UE support  Stairs            Wheelchair Mobility     Modified Rankin (Stroke Patients Only)       Balance                                             Pertinent Vitals/Pain Pain Assessment: Faces Faces Pain Scale: No hurt    Home Living Family/patient expects to be discharged to:: Unsure Living Arrangements: Alone               Additional Comments: pt poor historian    Prior Function Level of Independence: Independent with assistive device(s)         Comments: uses cane at times per notes from 2016     Hand Dominance        Extremity/Trunk Assessment        Lower Extremity Assessment Lower Extremity Assessment: Generalized weakness       Communication   Communication: No difficulties  Cognition Arousal/Alertness: Awake/alert Behavior During Therapy: Impulsive Overall Cognitive Status: No family/caregiver present to determine baseline cognitive functioning                                 General Comments: confused, perseverating on "open the door"      General Comments      Exercises     Assessment/Plan    PT Assessment Patient needs continued PT services  PT Problem List Decreased strength;Decreased safety awareness;Decreased mobility;Decreased balance;Decreased knowledge of use of DME       PT Treatment  Interventions Gait training;Therapeutic exercise;DME instruction;Therapeutic activities;Functional mobility training;Balance training;Patient/family education    PT Goals (Current goals can be found in the Care Plan section)  Acute Rehab PT Goals PT Goal Formulation: Patient unable to participate in goal setting Time For Goal Achievement: 08/11/16 Potential to Achieve Goals: Good    Frequency Min 3X/week   Barriers to discharge        Co-evaluation               End of Session Equipment Utilized During Treatment: Gait belt Activity Tolerance: Patient tolerated treatment well Patient left: in bed;with call bell/phone within reach;with  nursing/sitter in room;with bed alarm set   PT Visit Diagnosis: Unsteadiness on feet (R26.81)    Time: 1655-3748 PT Time Calculation (min) (ACUTE ONLY): 13 min   Charges:   PT Evaluation $PT Eval Low Complexity: 1 Procedure     PT G CodesCarmelia Bake, PT, DPT 08/04/2016 Pager: [phone]   York Ram E 08/04/2016, 12:42 PM

## 2016-08-04 NOTE — Progress Notes (Signed)
PROGRESS NOTE    Dominique Acosta  YWV:[id number] DOB: 08/26/1950 DOA: 08/03/2016 PCP: Myriam Jacobson, MD    Brief Narrative:  66 yo female presents with altered mental status. Patient known to have schizoaffective disorder, bipolar and depression. Patient was noted to be confused for the last 24 hours before admission. On the initial physical examination patient was hemodynamically stable, non focal. Significant leukocytosis with a urine analysis with many bacteria. Patient was admitted with the working diagnosis of acute metabolic encephalopathy related to urine tract infection.    Assessment & Plan:   Principal Problem:   Acute metabolic encephalopathy Active Problems:   Hypothyroidism   Schizoaffective disorder, bipolar type (HCC)   Acute lower UTI   Hypokalemia   Leukocytosis   Benign essential HTN  1. Metabolic encephalopathy. Patient is responsive to questions, not agitated. Unclear baseline mentation. Will continue neuro checks per unit protocol, physical therapy and supportive medical therapy with IV fluids and IV antibiotics.  2. Urine infection. Will continue antibiotic therapy with ciprofloxacin, patient is allergic to penicillin. Will continue to follow on cell cont, cultures and temperature curve.   3. Hypokalemia. Serum K at 3.2 will continue to replete with kcl (po), will follow on renal function and electrolytes, continue gentle hydration with IV fluids.  4. Hypothyroid. Continue levothyroxine  5. Dyslipidemia. Continue statin therapy, with simvastatin.   6. HTN. Continue blood pressure control.   7. Schizoaffective disorder with bipolar. Patient not very interactive, question baseline. Continue abilify, cogentin and lorazepam, follow with psychiatry recommendations.    DVT prophylaxis: enoxaparin  Code Status: Full  Family Communication: No family at the bedside  Disposition Plan: SNF  Consultants:   Psychiatry   Procedures:    Antimicrobials:    Ciprofloxacin     Subjective: Patient had been ambulating with physical therapy, tolerating po well with no nausea or vomiting. No chest pain or dyspnea. Has been verbal but not coherent.   Objective: Vitals:   08/03/16 1400 08/03/16 2106 08/04/16 0628 08/04/16 0900  BP:  101/87 (!) 117/56   Pulse:  64 63   Resp:  20 18   Temp:  98.2 F (36.8 C) 98.7 F (37.1 C)   TempSrc:  Oral Oral   SpO2:  95% 96%   Weight: 83.5 kg (184 lb)   83.7 kg (184 lb 9.6 oz)  Height: 5' (1.524 m)       Intake/Output Summary (Last 24 hours) at 08/04/16 1049 Last data filed at 08/04/16 0600  Gross per 24 hour  Intake             1608 ml  Output                0 ml  Net             1608 ml   Filed Weights   08/03/16 1400 08/04/16 0900  Weight: 83.5 kg (184 lb) 83.7 kg (184 lb 9.6 oz)    Examination:  General exam: deconditioned E ENT: mild pallor, no icterus, oral mucosa moist.   Respiratory system: Clear to auscultation. Respiratory effort normal. No wheezing, rales or rhonchi.  Cardiovascular system: S1 & S2 heard, RRR. No JVD, murmurs, rubs, gallops or clicks. No pedal edema. Gastrointestinal system: Abdomen is nondistended, soft and nontender. No organomegaly or masses felt. Normal bowel sounds heard. Central nervous system: Alert. Incoherent speech. No apparent focal neurological deficits. Extremities: Symmetric 5 x 5 power. Skin: No rashes, lesions or ulcers  Data Reviewed: I have personally reviewed following labs and imaging studies  CBC:  Recent Labs Lab 07/28/16 2104 07/29/16 1117 07/29/16 1324 08/03/16 0603 08/03/16 1300  WBC 18.4* 13.5* 13.2* 20.2* 17.4*  NEUTROABS 14.1* 10.4* 10.0* 17.5* 14.3*  HGB 13.1 12.7 12.9 15.3* 16.1*  HCT 39.5 38.6 39.2 44.0 46.8*  MCV 92.9 93.2 92.9 86.8 90.7  PLT 333 323 327 459* 875*   Basic Metabolic Panel:  Recent Labs Lab 07/28/16 1410 07/29/16 1324 08/03/16 0851 08/03/16 1300 08/04/16 0801  NA 138 137 137 135 136  K  2.8* 3.5 3.0* 3.1* 3.2*  CL 103 105 95* 97* 101  CO2 22 23 23 22 24   GLUCOSE 91 79 84 101* 89  BUN 15 13 14 14 15   CREATININE 0.84 0.83 0.82 0.85 0.85  CALCIUM 8.9 8.4* 9.0 8.9 8.6*  MG 2.4  --   --  2.1  --   PHOS  --   --   --  2.8  --    GFR: Estimated Creatinine Clearance: 61.6 mL/min (by C-G formula based on SCr of 0.85 mg/dL). Liver Function Tests:  Recent Labs Lab 07/28/16 1410 08/03/16 0851 08/03/16 1300  AST 72* 26 23  ALT 37 25 22  ALKPHOS 88 88 94  BILITOT 0.9 1.2 1.1  PROT 8.1 7.9 7.7  ALBUMIN 4.4 4.3 4.2   No results for input(s): LIPASE, AMYLASE in the last 168 hours. No results for input(s): AMMONIA in the last 168 hours. Coagulation Profile: No results for input(s): INR, PROTIME in the last 168 hours. Cardiac Enzymes:  Recent Labs Lab 07/29/16 1324  TROPONINI <0.03   BNP (last 3 results) No results for input(s): PROBNP in the last 8760 hours. HbA1C: No results for input(s): HGBA1C in the last 72 hours. CBG:  Recent Labs Lab 08/04/16 0738  GLUCAP 102*   Lipid Profile: No results for input(s): CHOL, HDL, LDLCALC, TRIG, CHOLHDL, LDLDIRECT in the last 72 hours. Thyroid Function Tests: No results for input(s): TSH, T4TOTAL, FREET4, T3FREE, THYROIDAB in the last 72 hours. Anemia Panel: No results for input(s): VITAMINB12, FOLATE, FERRITIN, TIBC, IRON, RETICCTPCT in the last 72 hours. Sepsis Labs:  Recent Labs Lab 07/29/16 1425  LATICACIDVEN 1.07    Recent Results (from the past 240 hour(s))  Urine culture     Status: None   Collection Time: 08/03/16  5:10 AM  Result Value Ref Range Status   Specimen Description URINE, RANDOM  Final   Special Requests NONE  Final   Culture   Final    NO GROWTH Performed at [facility], [address], [place], [place] [address]    Report Status 08/04/2016 FINAL  Final         Radiology Studies: Dg Chest Portable 1 View  Result Date: 08/03/2016 CLINICAL DATA:  Altered mental status EXAM:  PORTABLE CHEST 1 VIEW COMPARISON:  Chest radiograph 08/06/2016 FINDINGS: Unchanged cardiomediastinal contours with shallow lung inflation. No focal airspace consolidation or pulmonary edema. Right shoulder arthroplasty again noted. IMPRESSION: No active disease. Electronically Signed   By: Ulyses Jarred M.D.   On: 08/03/2016 05:32        Scheduled Meds: . ARIPiprazole  30 mg Oral Daily  . benztropine  2 mg Oral BID  . ciprofloxacin  500 mg Oral BID  . enoxaparin (LOVENOX) injection  40 mg Subcutaneous Q24H  . levothyroxine  88 mcg Oral QAC breakfast  . LORazepam  1 mg Oral BID  . propranolol  10  mg Oral BID  . simvastatin  40 mg Oral q1800   Continuous Infusions: . sodium chloride 50 mL/hr at 08/03/16 1326     LOS: 1 day        Tawni Millers, MD [facility] Pager [phone]  If 7PM-7AM, please contact night-coverage [email or web address] Password [facility] 08/04/2016, 10:49 AM

## 2016-08-04 NOTE — Clinical Social Work Note (Signed)
Clinical Social Work Assessment  Patient Details  Name: Dominique Acosta MRN: [id number] Date of Birth: [birth year]  Date of referral:                  Reason for consult:  Facility Placement, Discharge Planning                Permission sought to share information with:    Permission granted to share information::     Name::        Agency::     Relationship::     Contact Information:     Housing/Transportation Living arrangements for the past 2 months:  Apartment Source of Information:  Other (Comment Required) (Sister- Dominique Acosta) Patient Interpreter Needed:  None Criminal Activity/Legal Involvement Pertinent to Current Situation/Hospitalization:    Significant Relationships:  Siblings Lives with:  Self Do you feel safe going back to the place where you live?  Yes Need for family participation in patient care:  Yes (Comment)  Care giving concerns:  Patient presented to ED after a fall. EMS informed RN that patients apartment was "not livable" stating feces and pills were found all over the apartment   Social Worker assessment / plan: Patient was not able to be assessed. Patient was not able to communicate with CSW at the time. CSW did contact patients sister, Dominique Acosta, who has recently visited patient. Dominique Acosta did not notice feces in patients apartment but did state patient was taking pills "like candy". Patient does have care giver that visits once a week, Dominique Acosta. Unsure of patients discharge plan at this time.   Employment status:    Insurance informationEducational psychologist PT Recommendations:  Not assessed at this time Information / Referral to community resources:     Patient/Family's Response to care:  Patients sister appreciated CSW.   Patient/Family's Understanding of and Emotional Response to Diagnosis, Current Treatment, and Prognosis:  Unknown at this time.   Emotional Assessment Appearance:  Appears stated age Attitude/Demeanor/Rapport:  Unable to  Assess Affect (typically observed):  Unable to Assess Orientation:    Alcohol / Substance use:    Psych involvement (Current and /or in the community):     Discharge Needs  Concerns to be addressed:  Basic Needs, Care Coordination Readmission within the last 30 days:  Yes Current discharge risk:  Lives alone Barriers to Discharge:  Unsafe home situation   [name], LCSW 08/04/2016, 7:58 AM

## 2016-08-04 NOTE — Progress Notes (Signed)
Patient trying to crawl out of bed, trying to pull out IV.  MD notified.

## 2016-08-05 LAB — BASIC METABOLIC PANEL
ANION GAP: 10 (ref 5–15)
BUN: 13 mg/dL (ref 6–20)
CHLORIDE: 106 mmol/L (ref 101–111)
CO2: 25 mmol/L (ref 22–32)
Calcium: 8.4 mg/dL — ABNORMAL LOW (ref 8.9–10.3)
Creatinine, Ser: 0.84 mg/dL (ref 0.44–1.00)
Glucose, Bld: 101 mg/dL — ABNORMAL HIGH (ref 65–99)
Potassium: 2.9 mmol/L — ABNORMAL LOW (ref 3.5–5.1)
SODIUM: 141 mmol/L (ref 135–145)

## 2016-08-05 LAB — CBC WITH DIFFERENTIAL/PLATELET
BASOS ABS: 0.1 10*3/uL (ref 0.0–0.1)
Basophils Relative: 1 %
EOS PCT: 3 %
Eosinophils Absolute: 0.4 10*3/uL (ref 0.0–0.7)
HEMATOCRIT: 43.5 % (ref 36.0–46.0)
HEMOGLOBIN: 14.8 g/dL (ref 12.0–15.0)
LYMPHS PCT: 21 %
Lymphs Abs: 2.8 10*3/uL (ref 0.7–4.0)
MCH: 31.1 pg (ref 26.0–34.0)
MCHC: 34 g/dL (ref 30.0–36.0)
MCV: 91.4 fL (ref 78.0–100.0)
Monocytes Absolute: 0.9 10*3/uL (ref 0.1–1.0)
Monocytes Relative: 7 %
NEUTROS ABS: 9 10*3/uL — AB (ref 1.7–7.7)
NEUTROS PCT: 68 %
PLATELETS: 359 10*3/uL (ref 150–400)
RBC: 4.76 MIL/uL (ref 3.87–5.11)
RDW: 14.2 % (ref 11.5–15.5)
WBC: 13.3 10*3/uL — AB (ref 4.0–10.5)

## 2016-08-05 LAB — GLUCOSE, CAPILLARY: GLUCOSE-CAPILLARY: 101 mg/dL — AB (ref 65–99)

## 2016-08-05 MED ORDER — TRAMADOL HCL 50 MG PO TABS
50.0000 mg | ORAL_TABLET | Freq: Four times a day (QID) | ORAL | Status: DC | PRN
  Administered 2016-08-05: 50 mg via ORAL
  Filled 2016-08-05: qty 1

## 2016-08-05 MED ORDER — TRAMADOL HCL 50 MG PO TABS
50.0000 mg | ORAL_TABLET | Freq: Once | ORAL | Status: AC
  Administered 2016-08-05: 50 mg via ORAL
  Filled 2016-08-05: qty 1

## 2016-08-05 MED ORDER — KETOROLAC TROMETHAMINE 15 MG/ML IJ SOLN
15.0000 mg | Freq: Three times a day (TID) | INTRAMUSCULAR | Status: DC | PRN
  Administered 2016-08-05 – 2016-08-06 (×2): 15 mg via INTRAVENOUS
  Filled 2016-08-05 (×2): qty 1

## 2016-08-05 MED ORDER — POTASSIUM CHLORIDE 20 MEQ/15ML (10%) PO SOLN
40.0000 meq | ORAL | Status: AC
  Administered 2016-08-05 (×2): 40 meq via ORAL
  Filled 2016-08-05 (×3): qty 30

## 2016-08-05 NOTE — Progress Notes (Signed)
PROGRESS NOTE    Dominique Acosta  ZTI:[id number] DOB: [birth year] DOA: 08/03/2016 PCP: Myriam Jacobson, MD    Brief Narrative:  66 yo female presents with altered mental status. Patient known to have schizoaffective disorder, bipolar and depression. Patient was noted to be confused for the last 24 hours before admission. On the initial physical examination patient was hemodynamically stable, non focal. Significant leukocytosis with a urine analysis with many bacteria. Patient was admitted with the working diagnosis of acute metabolic encephalopathy related to urine tract infection.    Assessment & Plan:   Principal Problem:   Acute metabolic encephalopathy Active Problems:   Hypothyroidism   Schizoaffective disorder, bipolar type (HCC)   Acute lower UTI   Hypokalemia   Leukocytosis   Benign essential HTN   1. Metabolic encephalopathy. Patient today is more responsive and interactive, required lorazepam for agitation. Continue neuro checks per unit protocol. Patient orientated, no signs of disorganized thinking or lack of attention.   2. Urine infection. Continue antibiotic therapy with ciprofloxacin IV , patient is allergic to penicillin. Patient has remained afebrile, cultures no growth.   3. Hypokalemia. Worsening hypokalemia, serum K at 2,9, will replete with kcl po x2 doses of 40 meq. Follow renal panel in am. Continue gentle hydration with saline.   4. Hypothyroid. Continue levothyroxine, per home regimen, noted very elevated TSH up to 43 on 04/26, but trending down from 46, 7 days ago. Likely patient not compliant.   5. Dyslipidemia. Continue simvastatin.   6. HTN. Continue blood pressure monitoring, systolic in the 825'K. Continue propranolol.   7. Schizoaffective disorder with bipolar. Continue to follow on psychiatry recommendations, abilify decreased to 20 and cogentin to 1mg  bid.   8. Chronic pain syndrome. Patient responded well to ultram, will continue 50 mg  as needed q 6 hours, will add IV ketorolac as needed. Will avoid narcotics as possible.    DVT prophylaxis: enoxaparin  Code Status: Full  Family Communication: No family at the bedside  Disposition Plan: SNF  Consultants:   Psychiatry   Procedures:    Antimicrobials:   Ciprofloxacin     Subjective: Patient more awake and alert, complains of pain on her lower back, moderate to severe in intensity, worse with movement, no improving factors, no associated nausea or vomiting.   Objective: Vitals:   08/04/16 0628 08/04/16 0900 08/04/16 2104 08/05/16 0453  BP: (!) 117/56  118/65 112/62  Pulse: 63  78 66  Resp: 18  18 18   Temp: 98.7 F (37.1 C)  98.6 F (37 C) 98.5 F (36.9 C)  TempSrc: Oral  Oral Oral  SpO2: 96%  96% 94%  Weight:  83.7 kg (184 lb 9.6 oz)  82.1 kg (181 lb)  Height:        Intake/Output Summary (Last 24 hours) at 08/05/16 1343 Last data filed at 08/05/16 1253  Gross per 24 hour  Intake             2200 ml  Output                0 ml  Net             2200 ml   Filed Weights   08/03/16 1400 08/04/16 0900 08/05/16 0453  Weight: 83.5 kg (184 lb) 83.7 kg (184 lb 9.6 oz) 82.1 kg (181 lb)    Examination:  General exam: deconditioned E ENT. No pallor or icterus, oral mucosa moist.  Respiratory system: Clear to auscultation. Respiratory effort  normal. Cardiovascular system: S1 & S2 heard, RRR. No JVD, murmurs, rubs, gallops or clicks. No pedal edema. Gastrointestinal system: Abdomen is nondistended, soft and nontender. No organomegaly or masses felt. Normal bowel sounds heard. Central nervous system: Alert and oriented. No focal neurological deficits. Extremities: Symmetric 5 x 5 power. Skin: erythema on the lower back with no open wound.     Data Reviewed: I have personally reviewed following labs and imaging studies  CBC:  Recent Labs Lab 08/03/16 0603 08/03/16 1300 08/04/16 1117 08/05/16 0633  WBC 20.2* 17.4* 12.9* 13.3*  NEUTROABS  17.5* 14.3*  --  9.0*  HGB 15.3* 16.1* 15.2* 14.8  HCT 44.0 46.8* 44.4 43.5  MCV 86.8 90.7 90.8 91.4  PLT 459* 451* 433* 017   Basic Metabolic Panel:  Recent Labs Lab 08/03/16 0851 08/03/16 1300 08/04/16 0801 08/05/16 0633  NA 137 135 136 141  K 3.0* 3.1* 3.2* 2.9*  CL 95* 97* 101 106  CO2 23 22 24 25   GLUCOSE 84 101* 89 101*  BUN 14 14 15 13   CREATININE 0.82 0.85 0.85 0.84  CALCIUM 9.0 8.9 8.6* 8.4*  MG  --  2.1  --   --   PHOS  --  2.8  --   --    GFR: Estimated Creatinine Clearance: 61.7 mL/min (by C-G formula based on SCr of 0.84 mg/dL). Liver Function Tests:  Recent Labs Lab 08/03/16 0851 08/03/16 1300  AST 26 23  ALT 25 22  ALKPHOS 88 94  BILITOT 1.2 1.1  PROT 7.9 7.7  ALBUMIN 4.3 4.2   No results for input(s): LIPASE, AMYLASE in the last 168 hours. No results for input(s): AMMONIA in the last 168 hours. Coagulation Profile: No results for input(s): INR, PROTIME in the last 168 hours. Cardiac Enzymes: No results for input(s): CKTOTAL, CKMB, CKMBINDEX, TROPONINI in the last 168 hours. BNP (last 3 results) No results for input(s): PROBNP in the last 8760 hours. HbA1C: No results for input(s): HGBA1C in the last 72 hours. CBG:  Recent Labs Lab 08/04/16 0738 08/05/16 0740  GLUCAP 102* 101*   Lipid Profile: No results for input(s): CHOL, HDL, LDLCALC, TRIG, CHOLHDL, LDLDIRECT in the last 72 hours. Thyroid Function Tests:  Recent Labs  08/04/16 1117  TSH 42.352*   Anemia Panel: No results for input(s): VITAMINB12, FOLATE, FERRITIN, TIBC, IRON, RETICCTPCT in the last 72 hours. Sepsis Labs:  Recent Labs Lab 07/29/16 1425  LATICACIDVEN 1.07    Recent Results (from the past 240 hour(s))  Urine culture     Status: None   Collection Time: 08/03/16  5:10 AM  Result Value Ref Range Status   Specimen Description URINE, RANDOM  Final   Special Requests NONE  Final   Culture   Final    NO GROWTH Performed at [facility], [address], [place], [place] [address]    Report Status 08/04/2016 FINAL  Final         Radiology Studies: No results found.      Scheduled Meds: . ARIPiprazole  20 mg Oral Daily  . benztropine  1 mg Oral BID  . enoxaparin (LOVENOX) injection  40 mg Subcutaneous Q24H  . levothyroxine  88 mcg Oral QAC breakfast  . LORazepam  1 mg Oral BID  . propranolol  10 mg Oral BID  . simvastatin  40 mg Oral q1800   Continuous Infusions: . sodium chloride 50 mL/hr at 08/05/16 0843  . ciprofloxacin Stopped (08/05/16 1329)  LOS: 2 days        Tawni Millers, MD [facility] Pager [phone]  If 7PM-7AM, please contact night-coverage [email or web address] Password [facility] 08/05/2016, 1:43 PM

## 2016-08-05 NOTE — Progress Notes (Signed)
Patient complaining of severe pain and irritation from her bottom area,skin is red (rashes) , was  Tylenol and states "not helping" . Patient do not want to lay back in bed and just want to sit down in the chair. We will continue to assess and monitor the patient. MD notified

## 2016-08-05 NOTE — Progress Notes (Signed)
Physical Therapy Treatment Patient Details Name: Dominique Acosta MRN: [id number] DOB: [birth year] Today's Date: 08/05/2016    History of Present Illness 66 y.o. female with medical history significant for schizoaffective disorder, bipolar disorder and depression, hypertension, dyslipidemia, apparently lives in assisted living facility and does have a caregiver.  Pt admitted with AMS and UTI    PT Comments    Assisted pt OOB to bathroom then amb in hallway holding to IV pole.  Very unsteady, drunken gait.  Impaired safety cognition.  Impulsive.  HIGH FALL RISK.  Follow Up Recommendations  Supervision/Assistance - 24 hour;Home health PT     Equipment Recommendations       Recommendations for Other Services       Precautions / Restrictions Precautions Precautions: Fall Restrictions Weight Bearing Restrictions: No    Mobility  Bed Mobility   Bed Mobility: Supine to Sit;Sit to Supine     Supine to sit: Modified independent (Device/Increase time) Sit to supine: Modified independent (Device/Increase time)   General bed mobility comments: impulsive   Transfers Overall transfer level: Needs assistance Equipment used: None Transfers: Sit to/from Stand Sit to Stand: Supervision;Min guard         General transfer comment: assisted in bathroom, 50% VC's on envirnomental safety   Ambulation/Gait Ambulation/Gait assistance: Min guard;Min assist Ambulation Distance (Feet): 175 Feet Assistive device: None (holding to IV pole) Gait Pattern/deviations: Step-through pattern;Decreased stride length;Narrow base of support;Drifts right/left Gait velocity: decreased    General Gait Details: pt pushed IV pole for more support, pt very unsteady without.  Groggy/drunken/also impilsive   Financial trader [employer] (Stroke Patients Only)       Balance                                            Cognition Arousal/Alertness:  Awake/alert Behavior During Therapy: Impulsive Overall Cognitive Status: No family/caregiver present to determine baseline cognitive functioning                                 General Comments: foggy, pt repeated some things, following all instructions.       Exercises      General Comments        Pertinent Vitals/Pain Pain Assessment: No/denies pain    Home Living                      Prior Function            PT Goals (current goals can now be found in the care plan section) Progress towards PT goals: Progressing toward goals    Frequency    Min 3X/week      PT Plan Current plan remains appropriate    Co-evaluation             End of Session Equipment Utilized During Treatment: Gait belt Activity Tolerance: Patient tolerated treatment well Patient left: in bed;with call bell/phone within reach;with nursing/sitter in room;with bed alarm set Nurse Communication: Mobility status PT Visit Diagnosis: Unsteadiness on feet (R26.81)     Time: 1443-1540 PT Time Calculation (min) (ACUTE ONLY): 11 min  Charges:  $Gait Training: 8-22 mins  G Codes:       Rica Koyanagi  PTA [facility]  Acute  Rehab Pager      [phone]

## 2016-08-06 DIAGNOSIS — F250 Schizoaffective disorder, bipolar type: Secondary | ICD-10-CM

## 2016-08-06 LAB — CBC WITH DIFFERENTIAL/PLATELET
Basophils Absolute: 0.1 10*3/uL (ref 0.0–0.1)
Basophils Relative: 1 %
EOS PCT: 5 %
Eosinophils Absolute: 0.6 10*3/uL (ref 0.0–0.7)
HCT: 41.1 % (ref 36.0–46.0)
HEMOGLOBIN: 13.4 g/dL (ref 12.0–15.0)
LYMPHS ABS: 2.7 10*3/uL (ref 0.7–4.0)
Lymphocytes Relative: 21 %
MCH: 30.4 pg (ref 26.0–34.0)
MCHC: 32.6 g/dL (ref 30.0–36.0)
MCV: 93.2 fL (ref 78.0–100.0)
MONOS PCT: 6 %
Monocytes Absolute: 0.8 10*3/uL (ref 0.1–1.0)
Neutro Abs: 8.9 10*3/uL — ABNORMAL HIGH (ref 1.7–7.7)
Neutrophils Relative %: 67 %
PLATELETS: 345 10*3/uL (ref 150–400)
RBC: 4.41 MIL/uL (ref 3.87–5.11)
RDW: 14.4 % (ref 11.5–15.5)
WBC: 13.1 10*3/uL — AB (ref 4.0–10.5)

## 2016-08-06 LAB — BASIC METABOLIC PANEL
Anion gap: 8 (ref 5–15)
BUN: 13 mg/dL (ref 6–20)
CHLORIDE: 104 mmol/L (ref 101–111)
CO2: 26 mmol/L (ref 22–32)
Calcium: 8.4 mg/dL — ABNORMAL LOW (ref 8.9–10.3)
Creatinine, Ser: 0.77 mg/dL (ref 0.44–1.00)
GFR calc Af Amer: >60 mL/min (ref >60)
GFR calc non Af Amer: >60 mL/min (ref >60)
Glucose, Bld: 91 mg/dL (ref 65–99)
POTASSIUM: 3.8 mmol/L (ref 3.5–5.1)
SODIUM: 138 mmol/L (ref 135–145)

## 2016-08-06 MED ORDER — CIPROFLOXACIN HCL 500 MG PO TABS
250.0000 mg | ORAL_TABLET | Freq: Two times a day (BID) | ORAL | Status: DC
  Administered 2016-08-06: 250 mg via ORAL

## 2016-08-06 MED ORDER — ACETAMINOPHEN 500 MG PO TABS: 500.0000 mg | ORAL_TABLET | Freq: Four times a day (QID) | ORAL | 0 refills | Status: DC | PRN

## 2016-08-06 MED ORDER — BENZTROPINE MESYLATE 1 MG PO TABS: 1.0000 mg | ORAL_TABLET | Freq: Two times a day (BID) | ORAL | 0 refills | Status: DC

## 2016-08-06 MED ORDER — CIPROFLOXACIN HCL 250 MG PO TABS: 250.0000 mg | ORAL_TABLET | Freq: Two times a day (BID) | ORAL | 0 refills | Status: AC

## 2016-08-06 MED ORDER — ARIPIPRAZOLE 20 MG PO TABS: 20.0000 mg | ORAL_TABLET | Freq: Every day | ORAL | 0 refills | Status: DC

## 2016-08-06 NOTE — Care Management Note (Signed)
Case Management Note  Patient Details  Name: Dominique Acosta MRN: [id number] Date of Birth: May 23, 1950  Subjective/Objective:                  Altered mental status Action/Plan: Discharge planning Expected Discharge Date:  08/06/16               Expected Discharge Plan:  [place]  In-House Referral:     Discharge planning Services  CM Consult  Post Acute Care Choice:  Home Health Choice offered to:  Patient  DME Arranged:  N/A DME Agency:  NA  HH Arranged:  RN, PT, OT, Nurse's Aide, Social Work [employer] Agency:  [facility] [employer]  Status of Service:  Completed, signed off  If discussed at [employer], dates discussed:    Additional Comments: This CM is working remotely: Cm spoke with pt on phone who states she has friends who will be providing transportation home.  CM offered choice for home health agency and pt chooses [facility] to render HHPT/OT/RN/Aide/SW.  Cm notified [facility] rep, Dona.  Pt states she has all the DME needed at home. No other CM  Needs were communicated. Dellie Catholic, RN 08/06/2016, 10:50 AM

## 2016-08-06 NOTE — Discharge Summary (Addendum)
Physician Discharge Summary  Dominique Acosta VZD:[id number] DOB: Jun 09, 1950 DOA: 08/03/2016  PCP: Myriam Jacobson, MD  Admit date: 08/03/2016 Discharge date: 08/06/2016  Admitted From: Home  Disposition:  Home   Recommendations for Outpatient Follow-up:  1. Follow up with PCP in 1- week 2. Medication list has been reviewed and modified (stopped oxycodone, lorazepam, temazepam and venlafaxine) 3. Home health, home PT, nursing, aid and social worker to follow at home.  Home Health: Yes  Equipment/Devices: No   Discharge Condition: Stable CODE STATUS: Full  Diet recommendation: Heart Healthy   Brief/Interim Summary: This is a 66 year old female who presented to the hospital with the chief complaint of altered mental status, she is known to have schizoaffective disorder, bipolar disorder and depression. Patient developed worsening confusion for last 24 hours prior to admission, she was unable to give details due altered mental status. On initial physical examination, her blood pressure was 128/59, heart rate 79, respiratory rate 18, temperature 97.7, oxygen saturation 99%. Her mucous membranes were moist, her lungs were clear to auscultation bilaterally, heart S1-S2 present rhythmic, abdomen was soft nontender, lower extremities no edema, neurologically nonfocal. Sodium 137, potassium 3.0, chloride 95, bicarbonate 23, glucose 84, BUN 14, creatinine 0.82, calcium 9.0, white count 20.2, hemoglobin 15.3, hematocrit 44.0, platelets 459, acetaminophen less than 10, salicylate less than 7, alcohol less than 5, EKG was normal sinus rhythm. Urinalysis had 6-30 white cells, protein 30, negative nitrates. Urine drug screen positive for benzodiazepines, negative for opiates. Chest x-ray was negative for infiltrates.   The patient was admitted to the hospital working diagnosis of metabolic encephalopathy due to urinary tract infection.  1. Metabolic encephalopathy. Patient was admitted to the medical floor,  she was placed on IV fluids and IV antibiotics with ciprofloxacin. She showed improvement in her mentation, by the time of discharge she was awake and alert. She was seen by physical therapy, recommendations for home health services. Home PT, home health, home nursing, aid and social worker has been arranged. Patient's medication list was reviewed, polypharmacy was corrected, high-risk medications were discontinued. She will need a close follow-up as an outpatient. She is aware of the consequences of not following the instructions on how to take her medications appropriately, risk of overdose and consequences related to this.   2. Urinary tract infection. Patient was placed on IV ciprofloxacin with good toleration, note patient is allergic to penicillin, she has remained afebrile. She will finish antibiotic therapy to oral ciprofloxacin at home. Cultures were no growth. White cell count by the time of discharge down to 13.1.   3. Schizoaffective disorder with bipolar. Patient was seen by psychiatry, Abilify was decreased to 20 mg daily and Cogentin to 1 mg twice a day to decrease risk of anticholinergic delirium.   4. Hypothyroidism. Patient was resumed on levothyroxine, TSH was found to be elevated up to 43 on April 26, trending down from 46.7. Patient was reinforced about importance of being compliant with levothyroxine.  5. Hypokalemia. Potassium was corrected with potassium chloride, serum potassium by the time of discharge is 3.8.   6. Chronic pain syndrome. Will avoid narcotics, will recommend continue pain control acetaminophen outpatient physical therapy.  7. Dyslipidemia. Continue simvastatin.  8. Hypertension. Continue propanolol per home regimen, blood pressure remained stable during her hospitalization.  Discharge Diagnoses:  Principal Problem:   Acute metabolic encephalopathy Active Problems:   Hypothyroidism   Schizoaffective disorder, bipolar type ([place])   Acute lower UTI    Hypokalemia   Leukocytosis  Benign essential HTN    Discharge Instructions   Allergies as of 08/06/2016      Reactions   Amoxicillin Anaphylaxis   Penicillins Anaphylaxis   Has patient had a PCN reaction causing immediate rash, facial/tongue/throat swelling, SOB or lightheadedness with hypotension: yes Has patient had a PCN reaction causing severe rash involving mucus membranes or skin necrosis: no Has patient had a PCN reaction that required hospitalization: yes Has patient had a PCN reaction occurring within the last 10 years: no If all of the above answers are "NO", then may proceed with Cephalosporin use.   Phenytoin Other (See Comments)   Other Reaction: CNS Disorder   Dilaudid [hydromorphone Hcl] Other (See Comments)   Psychosis (per patient)   Haldol [haloperidol]    Psychosis per caregiver   Morphine And Related Other (See Comments)   psychosis      Medication List    STOP taking these medications   acetaminophen-codeine 300-30 MG tablet Commonly known as:  TYLENOL #3   acyclovir 200 MG capsule Commonly known as:  ZOVIRAX   amantadine 100 MG capsule Commonly known as:  SYMMETREL   baclofen 10 MG tablet Commonly known as:  LIORESAL   diphenhydrAMINE 25 mg capsule Commonly known as:  BENADRYL   gabapentin 800 MG tablet Commonly known as:  NEURONTIN   levofloxacin 500 MG tablet Commonly known as:  LEVAQUIN   LORazepam 1 MG tablet Commonly known as:  ATIVAN   nystatin cream Commonly known as:  MYCOSTATIN   Oxycodone HCl 10 MG Tabs   promethazine 25 MG tablet Commonly known as:  PHENERGAN   temazepam 30 MG capsule Commonly known as:  RESTORIL   venlafaxine XR 150 MG 24 hr capsule Commonly known as:  EFFEXOR-XR   VESICARE 10 MG tablet Generic drug:  solifenacin     TAKE these medications   acetaminophen 500 MG tablet Commonly known as:  TYLENOL Take 1 tablet (500 mg total) by mouth every 6 (six) hours as needed.   ARIPiprazole 20 MG  tablet Commonly known as:  ABILIFY Take 1 tablet (20 mg total) by mouth daily. Start taking on:  08/07/2016 What changed:  Another medication with the same name was removed. Continue taking this medication, and follow the directions you see here.   benztropine 1 MG tablet Commonly known as:  COGENTIN Take 1 tablet (1 mg total) by mouth 2 (two) times daily. What changed:  medication strength  how much to take  when to take this   ciprofloxacin 250 MG tablet Commonly known as:  CIPRO Take 1 tablet (250 mg total) by mouth 2 (two) times daily.   levothyroxine 88 MCG tablet Commonly known as:  SYNTHROID, LEVOTHROID Take 88 mcg by mouth daily before breakfast.   omeprazole 40 MG capsule Commonly known as:  PRILOSEC Take 40 mg by mouth daily.   propranolol 10 MG tablet Commonly known as:  INDERAL Take 10 mg by mouth 2 (two) times daily.   simvastatin 40 MG tablet Commonly known as:  ZOCOR Take by mouth daily.       Allergies  Allergen Reactions  . Amoxicillin Anaphylaxis  . Penicillins Anaphylaxis    Has patient had a PCN reaction causing immediate rash, facial/tongue/throat swelling, SOB or lightheadedness with hypotension: yes Has patient had a PCN reaction causing severe rash involving mucus membranes or skin necrosis: no Has patient had a PCN reaction that required hospitalization: yes Has patient had a PCN reaction occurring within the last 10 years:  no If all of the above answers are "NO", then may proceed with Cephalosporin use.   Marland Kitchen Phenytoin Other (See Comments)    Other Reaction: CNS Disorder  . Dilaudid [Hydromorphone Hcl] Other (See Comments)    Psychosis (per patient)  . Haldol [Haloperidol]     Psychosis per caregiver  . Morphine And Related Other (See Comments)    psychosis    Consultations:  Psychiatry    Procedures/Studies: Dg Chest 2 View  Result Date: 07/29/2016 CLINICAL DATA:  Low-grade fevers EXAM: CHEST  2 VIEW COMPARISON:  07/28/2016  FINDINGS: Cardiac shadow is at the upper limits of normal in size. The lungs are hypoinflated with some crowding of the vascular markings. This is stable from the prior exam. Mild left basilar atelectasis is noted. No sizable effusion is seen. Postsurgical changes in the right shoulder are noted. IMPRESSION: Stable hypoinflation and mild left basilar atelectasis. Electronically Signed   By: Inez Catalina M.D.   On: 07/29/2016 07:57   Dg Chest 2 View  Result Date: 07/28/2016 CLINICAL DATA:  Mental status changes. EXAM: CHEST  2 VIEW COMPARISON:  06/24/2016 FINDINGS: Low volume film. The cardio pericardial silhouette is enlarged. Underlying interstitial changes again noted pneumonia at the left lung base cannot be entirely excluded. Patient is status post right shoulder replacement. IMPRESSION: Cardiomegaly with low volumes and potential airspace disease/ pneumonia at the left base. Electronically Signed   By: Misty Stanley M.D.   On: 07/28/2016 20:26   Ct Head Wo Contrast  Result Date: 07/28/2016 CLINICAL DATA:  Schizophrenia. Altered mental status. Disorganized speech. EXAM: CT HEAD WITHOUT CONTRAST TECHNIQUE: Contiguous axial images were obtained from the base of the skull through the vertex without intravenous contrast. COMPARISON:  10/27/2015 FINDINGS: Brain: Mild generalized brain atrophy. No evidence of old or acute focal infarction, mass lesion, hemorrhage, hydrocephalus or extra-axial collection. Vascular: There is atherosclerotic calcification of the major vessels at the base of the brain. Skull: Normal Sinuses/Orbits: Clear/normal Other: None IMPRESSION: Normal head CT for age. Electronically Signed   By: Nelson Chimes M.D.   On: 07/28/2016 14:39   Dg Chest Portable 1 View  Result Date: 08/03/2016 CLINICAL DATA:  Altered mental status EXAM: PORTABLE CHEST 1 VIEW COMPARISON:  Chest radiograph 08/06/2016 FINDINGS: Unchanged cardiomediastinal contours with shallow lung inflation. No focal airspace  consolidation or pulmonary edema. Right shoulder arthroplasty again noted. IMPRESSION: No active disease. Electronically Signed   By: Ulyses Jarred M.D.   On: 08/03/2016 05:32       Subjective: Patient feeling well, nausea or vomiting, no chest pain or dyspnea.   Discharge Exam: Vitals:   08/06/16 0429 08/06/16 0849  BP: (!) 150/82 104/76  Pulse: 73 84  Resp: 18 20  Temp: 98.6 F (37 C)    Vitals:   08/05/16 1359 08/05/16 2027 08/06/16 0429 08/06/16 0849  BP: (!) 119/49 137/79 (!) 150/82 104/76  Pulse: 76 86 73 84  Resp: 18 20 18 20   Temp: 98.5 F (36.9 C) 99 F (37.2 C) 98.6 F (37 C)   TempSrc: Oral Oral Oral   SpO2: 95% 94% 96% 97%  Weight:   84.1 kg (185 lb 4.8 oz)   Height:        General: Pt is alert, awake, not in acute distress Cardiovascular: RRR, S1/S2 +, no rubs, no gallops Respiratory: CTA bilaterally, no wheezing, no rhonchi Abdominal: Soft, NT, ND, bowel sounds + Extremities: no edema, no cyanosis    The results of significant diagnostics from  this hospitalization (including imaging, microbiology, ancillary and laboratory) are listed below for reference.     Microbiology: Recent Results (from the past 240 hour(s))  Urine culture     Status: None   Collection Time: 08/03/16  5:10 AM  Result Value Ref Range Status   Specimen Description URINE, RANDOM  Final   Special Requests NONE  Final   Culture   Final    NO GROWTH Performed at [facility], [address], [place], [place] [address]    Report Status 08/04/2016 FINAL  Final     Labs: BNP (last 3 results) No results for input(s): BNP in the last 8760 hours. Basic Metabolic Panel:  Recent Labs Lab 08/03/16 0851 08/03/16 1300 08/04/16 0801 08/05/16 0633 08/06/16 0638  NA 137 135 136 141 138  K 3.0* 3.1* 3.2* 2.9* 3.8  CL 95* 97* 101 106 104  CO2 23 22 24 25 26   GLUCOSE 84 101* 89 101* 91  BUN 14 14 15 13 13   CREATININE 0.82 0.85 0.85 0.84 0.77  CALCIUM 9.0 8.9 8.6* 8.4*  8.4*  MG  --  2.1  --   --   --   PHOS  --  2.8  --   --   --    Liver Function Tests:  Recent Labs Lab 08/03/16 0851 08/03/16 1300  AST 26 23  ALT 25 22  ALKPHOS 88 94  BILITOT 1.2 1.1  PROT 7.9 7.7  ALBUMIN 4.3 4.2   No results for input(s): LIPASE, AMYLASE in the last 168 hours. No results for input(s): AMMONIA in the last 168 hours. CBC:  Recent Labs Lab 08/03/16 0603 08/03/16 1300 08/04/16 1117 08/05/16 0633 08/06/16 0638  WBC 20.2* 17.4* 12.9* 13.3* 13.1*  NEUTROABS 17.5* 14.3*  --  9.0* 8.9*  HGB 15.3* 16.1* 15.2* 14.8 13.4  HCT 44.0 46.8* 44.4 43.5 41.1  MCV 86.8 90.7 90.8 91.4 93.2  PLT 459* 451* 433* 359 345   Cardiac Enzymes: No results for input(s): CKTOTAL, CKMB, CKMBINDEX, TROPONINI in the last 168 hours. BNP: Invalid input(s): POCBNP CBG:  Recent Labs Lab 08/04/16 0738 08/05/16 0740  GLUCAP 102* 101*   D-Dimer No results for input(s): DDIMER in the last 72 hours. Hgb A1c No results for input(s): HGBA1C in the last 72 hours. Lipid Profile No results for input(s): CHOL, HDL, LDLCALC, TRIG, CHOLHDL, LDLDIRECT in the last 72 hours. Thyroid function studies  Recent Labs  08/04/16 1117  TSH 42.352*   Anemia work up No results for input(s): VITAMINB12, FOLATE, FERRITIN, TIBC, IRON, RETICCTPCT in the last 72 hours. Urinalysis    Component Value Date/Time   COLORURINE YELLOW 08/03/2016 0510   APPEARANCEUR HAZY (A) 08/03/2016 0510   LABSPEC 1.025 08/03/2016 0510   PHURINE 5.0 08/03/2016 0510   GLUCOSEU NEGATIVE 08/03/2016 0510   HGBUR NEGATIVE 08/03/2016 0510   HGBUR negative 09/16/2009 0954   BILIRUBINUR NEGATIVE 08/03/2016 0510   KETONESUR 80 (A) 08/03/2016 0510   PROTEINUR 30 (A) 08/03/2016 0510   UROBILINOGEN 0.2 09/05/2014 2126   NITRITE NEGATIVE 08/03/2016 0510   LEUKOCYTESUR NEGATIVE 08/03/2016 0510   Sepsis Labs Invalid input(s): PROCALCITONIN,  WBC,  LACTICIDVEN Microbiology Recent Results (from the past 240 hour(s))   Urine culture     Status: None   Collection Time: 08/03/16  5:10 AM  Result Value Ref Range Status   Specimen Description URINE, RANDOM  Final   Special Requests NONE  Final   Culture   Final  NO GROWTH Performed at [facility], [place] [address], [place], [place] [address]    Report Status 08/04/2016 FINAL  Final     Time coordinating discharge: 45 minutes  SIGNED:   Tawni Millers, MD  [facility] 08/06/2016, 9:57 AM Pager   If 7PM-7AM, please contact night-coverage [email or web address] Password [email or web address]

## 2016-08-06 NOTE — Clinical Social Work Note (Signed)
SW notified per MD that patient is stable for d/c today.  SW met with patient this morning. She stated that she was "ready to go home" and that she would be picked up by her Maintenance person- "Dominique Acosta" this afternoon. She did not know his phone number but that he was "on his way."  Pt then stated that her apartment was "messy" so she planned to go to a motel for a few days and then go back to her apartment. She could not relate if she had money to pay for the motel or how she would get home.  Pt denied that she had any family - but later admitted that she has a sister..  SW spoke with sister Dominique Acosta who indicated that she had been at patient's apartment about a week ago and did not notice any feces on the floor- only scattered pills lying around.  She indicated that she was in poor health and could not provide for her sister - however- patient has a home care aide- Monday-Friday to help her with her ADL's   Patient adamantly refuses to agree to any type of rest home placement.  She is able to walk, feed and toilet herself per nursing. She has been evaluated by Psychiatrist and discussed findings with Dr. Cathlean Sauer. There is no indication that patient does not have capacity- however it is clear that she is, at the very least, having issues with thriving at home.  SW at her apartment complex had reported yesterday to [facility] SW that patient's home environment was unstable and that she yells and screams at night as well as leaves the stove on.  This CSW, after discussing case with [facility], Director of SW- determined that an APS referral would be initiated. Referral completed with Arbutus Ped with [facility] for follow up. Case management also set up home health for RN, PT, OT, [facility] aide and SW for follow up as well.  Cab voucher provided to get patient back to her apartment. She stated that she does not have her key but she will ask the maintenance man Dominique Acosta to let her into her apartment.   Discussed in detail with  nursing.  No further SW intervention indicated.  SW signing off.  Lorie Phenix. Pauline Good, [place] (weekend coverage)

## 2016-08-06 NOTE — Progress Notes (Signed)
Patient discharged to home, all discharge medications and instructions reviewed and questions answered.  Patient assisted to vehicle by wheelchair. Taxi cab called for transportation home.

## 2016-08-06 NOTE — Progress Notes (Signed)
CM contacted CSW for transportation home as no one is available for transporting home.  No other CM needs were communicated.

## 2016-08-18 ENCOUNTER — Inpatient Hospital Stay (HOSPITAL_COMMUNITY): Payer: Medicare Other

## 2016-08-18 ENCOUNTER — Observation Stay (HOSPITAL_COMMUNITY)
Admission: EM | Admit: 2016-08-18 | Discharge: 2016-08-20 | Disposition: A | Discharge status: Skilled Nursing Facility | Payer: Medicare Other | Attending: Internal Medicine | Admitting: Internal Medicine

## 2016-08-18 ENCOUNTER — Emergency Department (HOSPITAL_COMMUNITY): Payer: Medicare Other

## 2016-08-18 ENCOUNTER — Encounter (HOSPITAL_COMMUNITY): Payer: Self-pay | Admitting: Obstetrics and Gynecology

## 2016-08-18 DIAGNOSIS — F209 Schizophrenia, unspecified: Secondary | ICD-10-CM | POA: Diagnosis not present

## 2016-08-18 DIAGNOSIS — F329 Major depressive disorder, single episode, unspecified: Secondary | ICD-10-CM | POA: Insufficient documentation

## 2016-08-18 DIAGNOSIS — G8929 Other chronic pain: Secondary | ICD-10-CM | POA: Diagnosis not present

## 2016-08-18 DIAGNOSIS — R262 Difficulty in walking, not elsewhere classified: Secondary | ICD-10-CM | POA: Diagnosis not present

## 2016-08-18 DIAGNOSIS — Z885 Allergy status to narcotic agent status: Secondary | ICD-10-CM | POA: Diagnosis not present

## 2016-08-18 DIAGNOSIS — R0789 Other chest pain: Secondary | ICD-10-CM | POA: Insufficient documentation

## 2016-08-18 DIAGNOSIS — Z96611 Presence of right artificial shoulder joint: Secondary | ICD-10-CM | POA: Insufficient documentation

## 2016-08-18 DIAGNOSIS — W010XXA Fall on same level from slipping, tripping and stumbling without subsequent striking against object, initial encounter: Secondary | ICD-10-CM | POA: Diagnosis not present

## 2016-08-18 DIAGNOSIS — M542 Cervicalgia: Secondary | ICD-10-CM | POA: Diagnosis not present

## 2016-08-18 DIAGNOSIS — F17210 Nicotine dependence, cigarettes, uncomplicated: Secondary | ICD-10-CM | POA: Diagnosis not present

## 2016-08-18 DIAGNOSIS — F419 Anxiety disorder, unspecified: Secondary | ICD-10-CM | POA: Insufficient documentation

## 2016-08-18 DIAGNOSIS — R531 Weakness: Secondary | ICD-10-CM | POA: Diagnosis not present

## 2016-08-18 DIAGNOSIS — S42292A Other displaced fracture of upper end of left humerus, initial encounter for closed fracture: Secondary | ICD-10-CM | POA: Diagnosis not present

## 2016-08-18 DIAGNOSIS — I252 Old myocardial infarction: Secondary | ICD-10-CM | POA: Diagnosis not present

## 2016-08-18 DIAGNOSIS — G40909 Epilepsy, unspecified, not intractable, without status epilepticus: Secondary | ICD-10-CM | POA: Insufficient documentation

## 2016-08-18 DIAGNOSIS — D72829 Elevated white blood cell count, unspecified: Secondary | ICD-10-CM | POA: Diagnosis not present

## 2016-08-18 DIAGNOSIS — I2510 Atherosclerotic heart disease of native coronary artery without angina pectoris: Secondary | ICD-10-CM | POA: Insufficient documentation

## 2016-08-18 DIAGNOSIS — E876 Hypokalemia: Secondary | ICD-10-CM | POA: Insufficient documentation

## 2016-08-18 DIAGNOSIS — S42309A Unspecified fracture of shaft of humerus, unspecified arm, initial encounter for closed fracture: Secondary | ICD-10-CM | POA: Diagnosis present

## 2016-08-18 DIAGNOSIS — E7800 Pure hypercholesterolemia, unspecified: Secondary | ICD-10-CM | POA: Diagnosis not present

## 2016-08-18 DIAGNOSIS — E039 Hypothyroidism, unspecified: Secondary | ICD-10-CM | POA: Diagnosis not present

## 2016-08-18 DIAGNOSIS — R079 Chest pain, unspecified: Secondary | ICD-10-CM

## 2016-08-18 DIAGNOSIS — Z9119 Patient's noncompliance with other medical treatment and regimen: Secondary | ICD-10-CM | POA: Insufficient documentation

## 2016-08-18 DIAGNOSIS — W228XXA Striking against or struck by other objects, initial encounter: Secondary | ICD-10-CM | POA: Diagnosis not present

## 2016-08-18 DIAGNOSIS — J449 Chronic obstructive pulmonary disease, unspecified: Secondary | ICD-10-CM | POA: Insufficient documentation

## 2016-08-18 DIAGNOSIS — E785 Hyperlipidemia, unspecified: Secondary | ICD-10-CM | POA: Insufficient documentation

## 2016-08-18 DIAGNOSIS — Z880 Allergy status to penicillin: Secondary | ICD-10-CM | POA: Diagnosis not present

## 2016-08-18 DIAGNOSIS — S42202A Unspecified fracture of upper end of left humerus, initial encounter for closed fracture: Secondary | ICD-10-CM

## 2016-08-18 DIAGNOSIS — M6281 Muscle weakness (generalized): Secondary | ICD-10-CM | POA: Insufficient documentation

## 2016-08-18 LAB — CBC
HCT: 37.3 % (ref 36.0–46.0)
Hemoglobin: 11.9 g/dL — ABNORMAL LOW (ref 12.0–15.0)
MCH: 30.4 pg (ref 26.0–34.0)
MCHC: 31.9 g/dL (ref 30.0–36.0)
MCV: 95.4 fL (ref 78.0–100.0)
PLATELETS: 300 10*3/uL (ref 150–400)
RBC: 3.91 MIL/uL (ref 3.87–5.11)
RDW: 14.7 % (ref 11.5–15.5)
WBC: 14.1 10*3/uL — ABNORMAL HIGH (ref 4.0–10.5)

## 2016-08-18 LAB — I-STAT CHEM 8, ED
BUN: 28 mg/dL — ABNORMAL HIGH (ref 6–20)
CHLORIDE: 104 mmol/L (ref 101–111)
Calcium, Ion: 0.96 mmol/L — ABNORMAL LOW (ref 1.15–1.40)
Creatinine, Ser: 0.8 mg/dL (ref 0.44–1.00)
GLUCOSE: 100 mg/dL — AB (ref 65–99)
HCT: 35 % — ABNORMAL LOW (ref 36.0–46.0)
Hemoglobin: 11.9 g/dL — ABNORMAL LOW (ref 12.0–15.0)
POTASSIUM: 4.4 mmol/L (ref 3.5–5.1)
Sodium: 136 mmol/L (ref 135–145)
TCO2: 31 mmol/L (ref 0–100)

## 2016-08-18 LAB — CBC WITH DIFFERENTIAL/PLATELET
Basophils Absolute: 0.1 10*3/uL (ref 0.0–0.1)
Basophils Relative: 0 %
Eosinophils Absolute: 0.2 10*3/uL (ref 0.0–0.7)
Eosinophils Relative: 2 %
HCT: 37.3 % (ref 36.0–46.0)
HEMOGLOBIN: 12 g/dL (ref 12.0–15.0)
LYMPHS ABS: 2.1 10*3/uL (ref 0.7–4.0)
LYMPHS PCT: 15 %
MCH: 30.7 pg (ref 26.0–34.0)
MCHC: 32.2 g/dL (ref 30.0–36.0)
MCV: 95.4 fL (ref 78.0–100.0)
Monocytes Absolute: 0.7 10*3/uL (ref 0.1–1.0)
Monocytes Relative: 5 %
NEUTROS PCT: 78 %
Neutro Abs: 11 10*3/uL — ABNORMAL HIGH (ref 1.7–7.7)
Platelets: 279 10*3/uL (ref 150–400)
RBC: 3.91 MIL/uL (ref 3.87–5.11)
RDW: 14.7 % (ref 11.5–15.5)
WBC: 14.1 10*3/uL — ABNORMAL HIGH (ref 4.0–10.5)

## 2016-08-18 LAB — TSH: TSH: 62.183 u[IU]/mL — ABNORMAL HIGH (ref 0.350–4.500)

## 2016-08-18 LAB — CREATININE, SERUM
CREATININE: 0.9 mg/dL (ref 0.44–1.00)
GFR calc Af Amer: >60 mL/min (ref >60)
GFR calc non Af Amer: >60 mL/min (ref >60)

## 2016-08-18 LAB — TROPONIN I: Troponin I: <0.03 ng/mL (ref <0.03)

## 2016-08-18 MED ORDER — FENTANYL CITRATE (PF) 100 MCG/2ML IJ SOLN
50.0000 ug | Freq: Once | INTRAMUSCULAR | Status: AC
  Administered 2016-08-18: 50 ug via INTRAVENOUS
  Filled 2016-08-18: qty 2

## 2016-08-18 MED ORDER — KETOROLAC TROMETHAMINE 15 MG/ML IJ SOLN
15.0000 mg | Freq: Four times a day (QID) | INTRAMUSCULAR | Status: DC | PRN
  Administered 2016-08-18 – 2016-08-20 (×6): 15 mg via INTRAVENOUS
  Filled 2016-08-18 (×6): qty 1

## 2016-08-18 MED ORDER — SIMVASTATIN 40 MG PO TABS
40.0000 mg | ORAL_TABLET | Freq: Every day | ORAL | Status: DC
  Administered 2016-08-18 – 2016-08-19 (×2): 40 mg via ORAL
  Filled 2016-08-18 (×2): qty 1

## 2016-08-18 MED ORDER — TEMAZEPAM 15 MG PO CAPS
30.0000 mg | ORAL_CAPSULE | Freq: Every evening | ORAL | Status: DC | PRN
  Administered 2016-08-18 – 2016-08-19 (×2): 30 mg via ORAL
  Filled 2016-08-18 (×2): qty 2

## 2016-08-18 MED ORDER — VENLAFAXINE HCL ER 150 MG PO CP24
300.0000 mg | ORAL_CAPSULE | Freq: Every day | ORAL | Status: DC
  Administered 2016-08-19 – 2016-08-20 (×2): 300 mg via ORAL
  Filled 2016-08-18 (×2): qty 2

## 2016-08-18 MED ORDER — LORAZEPAM 1 MG PO TABS
1.0000 mg | ORAL_TABLET | Freq: Two times a day (BID) | ORAL | Status: DC | PRN
  Administered 2016-08-19 – 2016-08-20 (×4): 1 mg via ORAL
  Filled 2016-08-18 (×4): qty 1

## 2016-08-18 MED ORDER — PANTOPRAZOLE SODIUM 40 MG PO TBEC
40.0000 mg | DELAYED_RELEASE_TABLET | Freq: Every day | ORAL | Status: DC
  Administered 2016-08-18 – 2016-08-20 (×3): 40 mg via ORAL
  Filled 2016-08-18 (×3): qty 1

## 2016-08-18 MED ORDER — FENTANYL CITRATE (PF) 100 MCG/2ML IJ SOLN
50.0000 ug | INTRAMUSCULAR | Status: DC | PRN
  Administered 2016-08-19 – 2016-08-20 (×5): 50 ug via INTRAVENOUS
  Filled 2016-08-18 (×5): qty 2

## 2016-08-18 MED ORDER — ENSURE ENLIVE PO LIQD
237.0000 mL | Freq: Two times a day (BID) | ORAL | Status: DC
  Administered 2016-08-19 – 2016-08-20 (×4): 237 mL via ORAL

## 2016-08-18 MED ORDER — ENOXAPARIN SODIUM 40 MG/0.4ML ~~LOC~~ SOLN
40.0000 mg | SUBCUTANEOUS | Status: DC
  Administered 2016-08-18 – 2016-08-19 (×2): 40 mg via SUBCUTANEOUS
  Filled 2016-08-18 (×2): qty 0.4

## 2016-08-18 MED ORDER — PROPRANOLOL HCL 10 MG PO TABS
10.0000 mg | ORAL_TABLET | Freq: Two times a day (BID) | ORAL | Status: DC
  Administered 2016-08-18 – 2016-08-19 (×3): 10 mg via ORAL
  Filled 2016-08-18 (×4): qty 1

## 2016-08-18 MED ORDER — BACLOFEN 10 MG PO TABS: 10.0000 mg | ORAL_TABLET | Freq: Three times a day (TID) | ORAL | Status: DC | PRN

## 2016-08-18 MED ORDER — ACETAMINOPHEN-CODEINE #3 300-30 MG PO TABS
1.0000 | ORAL_TABLET | Freq: Three times a day (TID) | ORAL | Status: DC | PRN
  Administered 2016-08-18 – 2016-08-20 (×5): 1 via ORAL
  Filled 2016-08-18 (×5): qty 1

## 2016-08-18 MED ORDER — SODIUM CHLORIDE 0.9 % IV SOLN
INTRAVENOUS | Status: DC
  Administered 2016-08-18 – 2016-08-20 (×3): via INTRAVENOUS

## 2016-08-18 MED ORDER — ARIPIPRAZOLE 10 MG PO TABS
20.0000 mg | ORAL_TABLET | Freq: Every day | ORAL | Status: DC
  Administered 2016-08-19 – 2016-08-20 (×2): 20 mg via ORAL
  Filled 2016-08-18 (×3): qty 2

## 2016-08-18 MED ORDER — SODIUM CHLORIDE 0.9% FLUSH
3.0000 mL | Freq: Two times a day (BID) | INTRAVENOUS | Status: DC
  Administered 2016-08-19: 3 mL via INTRAVENOUS

## 2016-08-18 MED ORDER — ONDANSETRON HCL 4 MG PO TABS: 4.0000 mg | ORAL_TABLET | Freq: Four times a day (QID) | ORAL | Status: DC | PRN

## 2016-08-18 MED ORDER — ONDANSETRON HCL 4 MG/2ML IJ SOLN: 4.0000 mg | Freq: Four times a day (QID) | INTRAMUSCULAR | Status: DC | PRN

## 2016-08-18 MED ORDER — BENZTROPINE MESYLATE 0.5 MG PO TABS
2.0000 mg | ORAL_TABLET | Freq: Three times a day (TID) | ORAL | Status: DC
  Administered 2016-08-18 – 2016-08-20 (×6): 2 mg via ORAL
  Filled 2016-08-18 (×6): qty 4

## 2016-08-18 MED ORDER — LEVOTHYROXINE SODIUM 88 MCG PO TABS
88.0000 ug | ORAL_TABLET | Freq: Every day | ORAL | Status: DC
  Administered 2016-08-19 – 2016-08-20 (×2): 88 ug via ORAL
  Filled 2016-08-18 (×2): qty 1

## 2016-08-18 NOTE — Consult Note (Signed)
Reason for Consult:Proximal humerus fx Referring Physician: Robyn Haber  [name] is an 66 y.o. female.  HPI: Dominique Acosta fell today in her apartment as she was going to her bedroom to change clothes. She was using her walker but thinks her skirt may have fallen down and tripped her. She denied any syncopal or presyncopal symptoms or LOC. She had immediate shoulder pain and was brought to the ED for evaluation. X-rays showed a proximal humerus fx and a possible ipsilateral elbow fx and orthopedic surgery was consulted. As she has no help at home the ED is trying to place her at a SNF. She has a complex medical history including multiple psychiatric diagnoses with resultant dyskinesia. She has some insight into her condition but often veers into nonsensical explanations of things that have happened to her or affect her.  Past Medical History:  Diagnosis Date  . Arm pain   . Chronic pain   . COPD (chronic obstructive pulmonary disease) ([place])   . Coronary artery disease   . Depression   . Dystonia   . Epilepsy ([place])   . High cholesterol   . Humerus fracture   . Hypothyroid   . Leukemia ([place])   . Myocardial infarct ([place])   . Schizophrenia ([place])   . Seizure [facility])     Past Surgical History:  Procedure Laterality Date  . EXTERNAL EAR SURGERY    . HUMERUS FRACTURE SURGERY    . SHOULDER SURGERY      History reviewed. No pertinent family history.  Social History:  reports that she has been smoking Cigarettes.  She has a 82.00 pack-year smoking history. She has never used smokeless tobacco. She reports that she does not drink alcohol or use drugs.  Allergies:  Allergies  Allergen Reactions  . Amoxicillin Anaphylaxis and Other (See Comments)    Has patient had a PCN reaction causing immediate rash, facial/tongue/throat swelling, SOB or lightheadedness with hypotension: Yes Has patient had a PCN reaction causing severe rash involving mucus membranes or skin necrosis: No Has patient had a PCN  reaction that required hospitalization No Has patient had a PCN reaction occurring within the last 10 years: No If all of the above answers are "NO", then may proceed with Cephalosporin use.  Marland Kitchen Penicillins Anaphylaxis and Other (See Comments)    Has patient had a PCN reaction causing immediate rash, facial/tongue/throat swelling, SOB or lightheadedness with hypotension: Yes Has patient had a PCN reaction causing severe rash involving mucus membranes or skin necrosis: No Has patient had a PCN reaction that required hospitalization No Has patient had a PCN reaction occurring within the last 10 years: No If all of the above answers are "NO", then may proceed with Cephalosporin use.   Marland Kitchen Phenytoin Other (See Comments)    Reaction:  CNS disorder   . Dilaudid [Hydromorphone Hcl] Other (See Comments)    Reaction:  Psychosis   . Haldol [Haloperidol] Other (See Comments)    Reaction:  Psychosis   . Morphine And Related Other (See Comments)    Reaction:  Psychosis     Medications: I have reviewed the patient's current medications.  Results for orders placed or performed during the hospital encounter of 08/18/16 (from the past 48 hour(s))  I-stat Chem 8, ED     Status: Abnormal   Collection Time: 08/18/16 12:00 PM  Result Value Ref Range   Sodium 136 135 - 145 mmol/L   Potassium 4.4 3.5 - 5.1 mmol/L   Chloride 104 101 -  111 mmol/L   BUN 28 (H) 6 - 20 mg/dL   Creatinine, Ser 0.80 0.44 - 1.00 mg/dL   Glucose, Bld 100 (H) 65 - 99 mg/dL   Calcium, Ion 0.96 (L) 1.15 - 1.40 mmol/L   TCO2 31 0 - 100 mmol/L   Hemoglobin 11.9 (L) 12.0 - 15.0 g/dL   HCT 35.0 (L) 36.0 - 46.0 %  CBC with Differential/Platelet     Status: Abnormal   Collection Time: 08/18/16 12:26 PM  Result Value Ref Range   WBC 14.1 (H) 4.0 - 10.5 K/uL   RBC 3.91 3.87 - 5.11 MIL/uL   Hemoglobin 12.0 12.0 - 15.0 g/dL   HCT 37.3 36.0 - 46.0 %   MCV 95.4 78.0 - 100.0 fL   MCH 30.7 26.0 - 34.0 pg   MCHC 32.2 30.0 - 36.0 g/dL   RDW  14.7 11.5 - 15.5 %   Platelets 279 150 - 400 K/uL   Neutrophils Relative % 78 %   Neutro Abs 11.0 (H) 1.7 - 7.7 K/uL   Lymphocytes Relative 15 %   Lymphs Abs 2.1 0.7 - 4.0 K/uL   Monocytes Relative 5 %   Monocytes Absolute 0.7 0.1 - 1.0 K/uL   Eosinophils Relative 2 %   Eosinophils Absolute 0.2 0.0 - 0.7 K/uL   Basophils Relative 0 %   Basophils Absolute 0.1 0.0 - 0.1 K/uL    Dg Elbow Complete Left  Result Date: 08/18/2016 CLINICAL DATA:  Left shoulder and elbow pain status post fall. EXAM: LEFT ELBOW - COMPLETE 3+ VIEW COMPARISON:  None. FINDINGS: There is a curvilinear lucency through the coronoid process of the ulna, which may represent un avulsion fracture or osteoarthritic osteophyte. The elbow is normally located. There is a mild soft tissue swelling. IMPRESSION: Curvilinear lucency through the coronoid process of the ulna, which may represent an avulsion fracture or an osteophyte. Please correlate to possible point tenderness. Electronically Signed   By: Fidela Salisbury M.D.   On: 08/18/2016 12:47   Ct Cervical Spine Wo Contrast  Result Date: 08/18/2016 CLINICAL DATA:  Neck pain after dresser fell on patient today. Initial encounter. EXAM: CT CERVICAL SPINE WITHOUT CONTRAST TECHNIQUE: Multidetector CT imaging of the cervical spine was performed without intravenous contrast. Multiplanar CT image reconstructions were also generated. COMPARISON:  None. FINDINGS: Alignment: No traumatic malalignment Skull base and vertebrae: Negative for acute fracture. Soft tissues and spinal canal: No prevertebral fluid or swelling. No visible canal hematoma. Disc levels: Multilevel facet arthropathy with bulky spurring. Disc degeneration greatest at C5-6 and C6-7. Bilateral foraminal narrowings. No indication of advanced spinal stenosis. Upper chest: Negative Other: Soft tissue stranding along the partly seen left clavicle. IMPRESSION: 1. No evidence of acute cervical spine injury. 2. Soft tissue  swelling around the partly visualized left clavicle, consider radiography. Electronically Signed   By: Monte Fantasia M.D.   On: 08/18/2016 12:29   Dg Shoulder Left  Result Date: 08/18/2016 CLINICAL DATA:  Left shoulder pain, post fall. EXAM: LEFT SHOULDER - 2+ VIEW COMPARISON:  None. FINDINGS: There is a comminuted intra-articular fracture of the left humeral head with extension to the surgical neck. The fracture is impacted with posterior dislocation of the posterior fracture fragment. The humeral head is inferiorly and posteriorly subluxed, which may be due to intra-articular hematoma. IMPRESSION: Comminuted impacted intra-articular fracture of the left humeral head/surgical neck with mild posterior inferior subluxation, which may be due to intracapsular hematoma. Electronically Signed   By: Linwood Dibbles.D.  On: 08/18/2016 12:55    Review of Systems  Constitutional: Negative for weight loss.  HENT: Negative for ear discharge, ear pain, hearing loss and tinnitus.   Eyes: Negative for blurred vision, double vision, photophobia and pain.  Respiratory: Negative for cough, sputum production and shortness of breath.   Cardiovascular: Negative for chest pain.  Gastrointestinal: Negative for abdominal pain, nausea and vomiting.  Genitourinary: Negative for dysuria, flank pain, frequency and urgency.  Musculoskeletal: Positive for joint pain (Left shoulder and elbow). Negative for back pain, falls, myalgias and neck pain.  Neurological: Positive for tremors and seizures. Negative for dizziness, tingling, sensory change, focal weakness, loss of consciousness and headaches.  Endo/Heme/Allergies: Does not bruise/bleed easily.  Psychiatric/Behavioral: Negative for depression, memory loss and substance abuse. The patient is not nervous/anxious.    Blood pressure 108/60, pulse 76, temperature 98.4 F (36.9 C), temperature source Oral, resp. rate 14, height 5\' 1"  (1.549 m), weight 80.7 kg (178  lb), SpO2 93 %. Physical Exam  Constitutional: She appears well-developed and well-nourished. No distress.  HENT:  Head: Normocephalic.  Eyes: Conjunctivae are normal. Right eye exhibits no discharge. Left eye exhibits no discharge. No scleral icterus.  Cardiovascular: Normal rate and regular rhythm.   Respiratory: Effort normal. No respiratory distress.  Musculoskeletal:  Right shoulder, elbow, wrist, digits- no skin wounds, nontender, no instability, no blocks to motion  Sens  Ax/R/M/U intact  Mot   Ax/ R/ PIN/ M/ AIN/ U intact  Rad 2+  Left shoulder TTP, elbow mild TTP diffusely.  Wrist, digits- macerated wound at base of thumb, not acute, nontender, no instability, no blocks to motion  Sens  Ax/R/M/U intact  Mot   Ax/ R/ PIN/ M/ AIN/ U intact  Rad could not assess (in splint), cap refill <2s   Lymphadenopathy:    She has no cervical adenopathy.  Neurological: She is alert. Coordination abnormal.  Skin: She is not diaphoretic.  Psychiatric: Her speech is rapid and/or pressured and tangential.    Assessment/Plan: Fall Left proximal humerus fx -- This 4-part fx will best be served with ORIF vs hemiarthroplasty. Would like her to f/u with Dr. Tamera Punt either tomorrow or Monday if she is placed at [facility]. If she is admitted then please let Dr. Tamera Punt know she will be an inpatient. Continue sling and NWB at all times. Questionable left elbow fx -- Possibly a small avulsion at the olecranon though I really couldn't elicit any tenderness. Continue splint.  Please call with questions. Thank you for the opportunity to treat Ms. Dominique Acosta.    Lisette Abu, PA-C Orthopedic Surgery [phone] 08/18/2016, 2:01 PM

## 2016-08-18 NOTE — NC FL2 (Signed)
[place] LEVEL OF CARE SCREENING TOOL     IDENTIFICATION  Patient Name: Dominique Acosta Birthdate: 1950-08-25 Sex: female Admission Date (Current Location): 08/18/2016  [facility] and [place] Number:  Herbalist and Address:  [facility],  [place] [address], [place]      Provider Number: [id number]  Attending Physician Name and Address:  Davonna Belling, MD  Relative Name and Phone Number:       Current Level of Care: Hospital Recommended Level of Care: [place] Prior Approval Number:    Date Approved/Denied:   PASRR Number:    Discharge Plan: SNF    Current Diagnoses: Patient Active Problem List   Diagnosis Date Noted  . Acute metabolic encephalopathy 01/74/9449  . Acute lower UTI 08/03/2016  . Hypokalemia 08/03/2016  . Leukocytosis 08/03/2016  . Benign essential HTN 08/03/2016  . Schizoaffective disorder, bipolar type ([place]) 07/08/2016  . Hypothyroidism 06/08/2006    Orientation RESPIRATION BLADDER Height & Weight     Self, Time, Situation, Place  Normal Incontinent Weight: 178 lb (80.7 kg) Height:  5\' 1"  (154.9 cm)  BEHAVIORAL SYMPTOMS/MOOD NEUROLOGICAL BOWEL NUTRITION STATUS      Continent Diet (Regular)  AMBULATORY STATUS COMMUNICATION OF NEEDS Skin   Limited Assist Verbally Normal                       Personal Care Assistance Level of Assistance  Bathing, Dressing, Feeding Bathing Assistance: Maximum assistance Feeding assistance: Independent Dressing Assistance: Maximum assistance     Functional Limitations Info             SPECIAL CARE FACTORS FREQUENCY  PT (By licensed PT), OT (By licensed OT)     PT Frequency: 5 OT Frequency: 5            Contractures      Additional Factors Info  Code Status, Allergies Code Status Info: Prior Allergies Info: AMOXICILLIN, PENICILLINS, PHENYTOIN, DILAUDID HYDROMORPHONE HCL, HALDOL HALOPERIDOL, MORPHINE AND RELATED             Current Medications (08/18/2016):  This is the current hospital active medication list No current facility-administered medications for this encounter.    Current Outpatient Prescriptions  Medication Sig Dispense Refill  . acetaminophen-codeine (TYLENOL #3) 300-30 MG tablet Take 1 tablet by mouth every 8 (eight) hours as needed for moderate pain.    . ARIPiprazole (ABILIFY) 20 MG tablet Take 1 tablet (20 mg total) by mouth daily. 15 tablet 0  . baclofen (LIORESAL) 10 MG tablet Take 10 mg by mouth 3 (three) times daily as needed for muscle spasms.    . benztropine (COGENTIN) 2 MG tablet Take 2 mg by mouth 3 (three) times daily.    Marland Kitchen levothyroxine (SYNTHROID, LEVOTHROID) 88 MCG tablet Take 88 mcg by mouth daily before breakfast.    . LORazepam (ATIVAN) 1 MG tablet Take 1 mg by mouth 2 (two) times daily as needed for anxiety.    Marland Kitchen omeprazole (PRILOSEC) 40 MG capsule Take 40 mg by mouth daily.    . propranolol (INDERAL) 10 MG tablet Take 10 mg by mouth 2 (two) times daily.    . simvastatin (ZOCOR) 40 MG tablet Take 40 mg by mouth at bedtime.     . temazepam (RESTORIL) 30 MG capsule Take 30 mg by mouth at bedtime as needed for sleep.    Marland Kitchen venlafaxine XR (EFFEXOR-XR) 150 MG 24 hr capsule Take 300 mg by mouth daily  with breakfast.       Discharge Medications: Please see discharge summary for a list of discharge medications.  Relevant Imaging Results:  Relevant Lab Results:   Additional [place], LCSW

## 2016-08-18 NOTE — Evaluation (Addendum)
Physical Therapy Evaluation Patient Details Name: Dominique Acosta MRN: [id number] DOB: 1950-05-13 Today's Date: 08/18/2016   History of Present Illness  66 yo female admitted to ED after falling at home. PT sustained L prox humerus fx and possible L elbow fx. Hx of bipolar d/o, schizoaffective disorder, depression, dystonia (per pt report)    Clinical Impression  On eval, pt required Mod assist +2 for mobility. She sat EOB for a few minutes with close guard assist. L UE pain currently limiting mobility and progression of activity during eval. At this time, pt is not safe to d/c home alone. Recommend ST rehab at SNF to improve functional mobility and assist with pt regaining PLOF. Ortho consult still pending-PA has seen, MD has not.      Follow Up Recommendations SNF    Equipment Recommendations   (continuing to assess)    Recommendations for Other Services OT consult     Precautions / Restrictions Precautions Precautions: Fall Required Braces or Orthoses: Sling (L UE) Restrictions Weight Bearing Restrictions: No      Mobility  Bed Mobility Overal bed mobility: Needs Assistance Bed Mobility: Supine to Sit;Sit to Supine     Supine to sit: Mod assist;+2 for physical assistance;+2 for safety/equipment;HOB elevated Sit to supine: Mod assist   General bed mobility comments: Assist for trunk and bil LEs. Increased time. Cues for safety, technique, NWB L UE status.   Transfers                 General transfer comment: NT-pt requested to defer standing due to L UE pain (per pt, meds had not taken effect yet)  Ambulation/Gait                Stairs            Wheelchair Mobility    Modified Rankin (Stroke Patients Only)       Balance Overall balance assessment: Needs assistance;History of Falls   Sitting balance-Leahy Scale: Fair Sitting balance - Comments: Pt sat EOB ~3-4 minutes with UE support and very close guarding.                                       Pertinent Vitals/Pain Pain Assessment: 0-10 Faces Pain Scale: Hurts whole lot Pain Location: L UE Pain Intervention(s): Limited activity within patient's tolerance;Repositioned    Home Living Family/patient expects to be discharged to:: Skilled nursing facility Living Arrangements: Alone Available Help at Discharge: Personal care attendant           Home Equipment: Cane - single point      Prior Function Level of Independence: Independent with assistive device(s)         Comments: uses cane for ambulation per pt     Hand Dominance        Extremity/Trunk Assessment   Upper Extremity Assessment Upper Extremity Assessment: LUE deficits/detail;RUE deficits/detail RUE Deficits / Details: WFL LUE Deficits / Details: NT-in sling    Lower Extremity Assessment Lower Extremity Assessment: Generalized weakness (noted writhing while in supine)    Cervical / Trunk Assessment Cervical / Trunk Assessment: Other exceptions Cervical / Trunk Exceptions: noted writhing while in supine  Communication   Communication: No difficulties  Cognition Arousal/Alertness: Awake/alert Behavior During Therapy: WFL for tasks assessed/performed Overall Cognitive Status: Within Functional Limits for tasks assessed  General Comments: Pt is very talkative and she bounces around subjects      General Comments      Exercises     Assessment/Plan    PT Assessment Patient needs continued PT services  PT Problem List         PT Treatment Interventions DME instruction;Gait training;Therapeutic exercise;Balance training;Functional mobility training;Therapeutic activities;Patient/family education    PT Goals (Current goals can be found in the Care Plan section)  Acute Rehab PT Goals Patient Stated Goal: less pain PT Goal Formulation: With patient Time For Goal Achievement: 09/01/16 Potential to Achieve Goals: Good     Frequency Min 3X/week   Barriers to discharge Decreased caregiver support      Co-evaluation               AM-PAC PT "6 Clicks" Daily Activity  Outcome Measure Difficulty turning over in bed (including adjusting bedclothes, sheets and blankets)?: A Lot Difficulty moving from lying on back to sitting on the side of the bed? : A Lot Difficulty sitting down on and standing up from a chair with arms (e.g., wheelchair, bedside commode, etc,.)?: A Lot Help needed moving to and from a bed to chair (including a wheelchair)?: A Lot Help needed walking in hospital room?: A Lot Help needed climbing 3-5 steps with a railing? : A Lot 6 Click Score: 12    End of Session   Activity Tolerance: Patient limited by pain Patient left: in bed;with call bell/phone within reach   PT Visit Diagnosis: Muscle weakness (generalized) (M62.81);Difficulty in walking, not elsewhere classified (R26.2);Pain Pain - Right/Left: Left Pain - part of body: Shoulder;Arm    Time: 1448-1856 PT Time Calculation (min) (ACUTE ONLY): 20 min   Charges:   PT Evaluation $PT Eval Moderate Complexity: 1 Procedure     PT G Codes:   PT G-Codes **NOT FOR INPATIENT CLASS** Functional Assessment Tool Used: AM-PAC 6 Clicks Basic Mobility;Clinical judgement Functional Limitation: Mobility: Walking and moving around Mobility: Walking and Moving Around Current Status (D1497): At least 40 percent but less than 60 percent impaired, limited or restricted Mobility: Walking and Moving Around Goal Status [phone]): At least 20 percent but less than 40 percent impaired, limited or restricted      [name], MPT Pager: [phone]

## 2016-08-18 NOTE — ED Notes (Signed)
Pt has a laceration on the left thumb that has sanguinous drainage.

## 2016-08-18 NOTE — ED Notes (Signed)
Bed: WA04 Expected date:  Expected time:  Means of arrival:  Comments: EMS 

## 2016-08-18 NOTE — ED Provider Notes (Signed)
[place] DEPT Provider Note   CSN: [id number] Arrival date & time: 08/18/16  1058     History   Chief Complaint Chief Complaint  Patient presents with  . Fall    HPI Dominique Acosta is a 66 y.o. female.  HPI Patient presents after fall. Reportedly lost her balance using her walker and fell onto her left side. Complaining of pain in her left arm and elbow. No chest or abdominal pain. States he does have chronic pain all over her medicines unchanged. States that her home health aide has not been helping her and she has not had food in the last few days. States she cannot get around the house without use of her arms. Denies fevers or chills.   Past Medical History:  Diagnosis Date  . Arm pain   . Chronic pain   . COPD (chronic obstructive pulmonary disease) ([place])   . Coronary artery disease   . Depression   . Dystonia   . Epilepsy ([place])   . High cholesterol   . Humerus fracture   . Hypothyroid   . Leukemia ([place])   . Myocardial infarct ([place])   . Schizophrenia ([place])   . Seizure [facility])     Patient Active Problem List   Diagnosis Date Noted  . Humerus fracture 08/18/2016  . Acute metabolic encephalopathy 85/88/5027  . Acute lower UTI 08/03/2016  . Hypokalemia 08/03/2016  . Leukocytosis 08/03/2016  . Benign essential HTN 08/03/2016  . Schizoaffective disorder, bipolar type ([place]) 07/08/2016  . Hypothyroidism 06/08/2006    Past Surgical History:  Procedure Laterality Date  . EXTERNAL EAR SURGERY    . HUMERUS FRACTURE SURGERY    . SHOULDER SURGERY      OB History    No data available       Home Medications    Prior to Admission medications   Medication Sig Start Date End Date Taking? Authorizing Provider  acetaminophen-codeine (TYLENOL #3) 300-30 MG tablet Take 1 tablet by mouth every 8 (eight) hours as needed for moderate pain.   Yes [provider]  ARIPiprazole (ABILIFY) 20 MG tablet Take 1 tablet (20 mg total) by mouth daily. 08/07/16  Yes  Arrien, Jimmy Picket, MD  baclofen (LIORESAL) 10 MG tablet Take 10 mg by mouth 3 (three) times daily as needed for muscle spasms.   Yes [provider]  benztropine (COGENTIN) 2 MG tablet Take 2 mg by mouth 3 (three) times daily.   Yes [provider]  levothyroxine (SYNTHROID, LEVOTHROID) 88 MCG tablet Take 88 mcg by mouth daily before breakfast.   Yes [provider]  LORazepam (ATIVAN) 1 MG tablet Take 1 mg by mouth 2 (two) times daily as needed for anxiety.   Yes [provider]  omeprazole (PRILOSEC) 40 MG capsule Take 40 mg by mouth daily.   Yes [provider]  propranolol (INDERAL) 10 MG tablet Take 10 mg by mouth 2 (two) times daily.   Yes [provider]  simvastatin (ZOCOR) 40 MG tablet Take 40 mg by mouth at bedtime.    Yes [provider]  temazepam (RESTORIL) 30 MG capsule Take 30 mg by mouth at bedtime as needed for sleep.   Yes [provider]  venlafaxine XR (EFFEXOR-XR) 150 MG 24 hr capsule Take 300 mg by mouth daily with breakfast.   Yes [provider]    Family History History reviewed. No pertinent family history.  Social History Social History  Substance Use Topics  . Smoking  status: Current Every Day Smoker    Packs/day: 2.00    Years: 41.00    Types: Cigarettes  . Smokeless tobacco: Never Used  . Alcohol use No     Allergies   Amoxicillin; Penicillins; Phenytoin; Dilaudid [hydromorphone hcl]; Haldol [haloperidol]; and Morphine and related   Review of Systems Review of Systems  Constitutional: Negative for appetite change and fever.  HENT: Negative for congestion.   Respiratory: Positive for shortness of breath.   Gastrointestinal: Negative for abdominal pain.  Musculoskeletal: Positive for back pain.       Left elbow and left shoulder pain. Also pain in left side of her neck.  Neurological: Negative for weakness and headaches.     Physical Exam Updated Vital  Signs BP 108/68   Pulse 78   Temp 99 F (37.2 C) (Oral)   Resp 16   Ht 5\' 1"  (1.549 m)   Wt 178 lb (80.7 kg)   SpO2 94%   BMI 33.63 kg/m   Physical Exam  Constitutional: She appears well-developed and well-nourished.  HENT:  Head: Atraumatic.  Eyes: EOM are normal.  Neck:  Tenderness over left paraspinal musculature and less tenderness over midline cervical spine. Good range of motion.  Cardiovascular: Normal rate.   Pulmonary/Chest: Effort normal.  Abdominal: Soft. There is no tenderness.  Musculoskeletal: She exhibits tenderness.  Tenderness over left shoulder proximally. There is ecchymosis anteriorly. Also tenderness over elbow with decreased range of motion in both elbow and shoulder. There is a chronic appearing wound over the distal phalanx of the left thumb.  Skin: Skin is warm. Capillary refill takes less than 2 seconds.  Psychiatric: She has a normal mood and affect.     ED Treatments / Results  Labs (all labs ordered are listed, but only abnormal results are displayed) Labs Reviewed  CBC WITH DIFFERENTIAL/PLATELET - Abnormal; Notable for the following:       Result Value   WBC 14.1 (*)    Neutro Abs 11.0 (*)    All other components within normal limits  I-STAT CHEM 8, ED - Abnormal; Notable for the following:    BUN 28 (*)    Glucose, Bld 100 (*)    Calcium, Ion 0.96 (*)    Hemoglobin 11.9 (*)    HCT 35.0 (*)    All other components within normal limits  CBC WITH DIFFERENTIAL/PLATELET    EKG  EKG Interpretation None       Radiology Dg Elbow Complete Left  Result Date: 08/18/2016 CLINICAL DATA:  Left shoulder and elbow pain status post fall. EXAM: LEFT ELBOW - COMPLETE 3+ VIEW COMPARISON:  None. FINDINGS: There is a curvilinear lucency through the coronoid process of the ulna, which may represent un avulsion fracture or osteoarthritic osteophyte. The elbow is normally located. There is a mild soft tissue swelling. IMPRESSION: Curvilinear lucency  through the coronoid process of the ulna, which may represent an avulsion fracture or an osteophyte. Please correlate to possible point tenderness. Electronically Signed   By: Fidela Salisbury M.D.   On: 08/18/2016 12:47   Ct Cervical Spine Wo Contrast  Result Date: 08/18/2016 CLINICAL DATA:  Neck pain after dresser fell on patient today. Initial encounter. EXAM: CT CERVICAL SPINE WITHOUT CONTRAST TECHNIQUE: Multidetector CT imaging of the cervical spine was performed without intravenous contrast. Multiplanar CT image reconstructions were also generated. COMPARISON:  None. FINDINGS: Alignment: No traumatic malalignment Skull base and vertebrae: Negative for acute fracture. Soft tissues and spinal canal: No prevertebral fluid  or swelling. No visible canal hematoma. Disc levels: Multilevel facet arthropathy with bulky spurring. Disc degeneration greatest at C5-6 and C6-7. Bilateral foraminal narrowings. No indication of advanced spinal stenosis. Upper chest: Negative Other: Soft tissue stranding along the partly seen left clavicle. IMPRESSION: 1. No evidence of acute cervical spine injury. 2. Soft tissue swelling around the partly visualized left clavicle, consider radiography. Electronically Signed   By: Monte Fantasia M.D.   On: 08/18/2016 12:29   Dg Shoulder Left  Result Date: 08/18/2016 CLINICAL DATA:  Left shoulder pain, post fall. EXAM: LEFT SHOULDER - 2+ VIEW COMPARISON:  None. FINDINGS: There is a comminuted intra-articular fracture of the left humeral head with extension to the surgical neck. The fracture is impacted with posterior dislocation of the posterior fracture fragment. The humeral head is inferiorly and posteriorly subluxed, which may be due to intra-articular hematoma. IMPRESSION: Comminuted impacted intra-articular fracture of the left humeral head/surgical neck with mild posterior inferior subluxation, which may be due to intracapsular hematoma. Electronically Signed   By: Fidela Salisbury M.D.   On: 08/18/2016 12:55    Procedures Procedures (including critical care time)  Medications Ordered in ED Medications  fentaNYL (SUBLIMAZE) injection 50 mcg (50 mcg Intravenous Given 08/18/16 1230)  fentaNYL (SUBLIMAZE) injection 50 mcg (50 mcg Intravenous Given 08/18/16 1310)  fentaNYL (SUBLIMAZE) injection 50 mcg (50 mcg Intravenous Given 08/18/16 1525)     Initial Impression / Assessment and Plan / ED Course  I have reviewed the triage vital signs and the nursing notes.  Pertinent labs & imaging results that were available during my care of the patient were reviewed by me and considered in my medical decision making (see chart for details).     Patient with fall. Baseline gets around with a walker. Has proximal humerus fracture. Cannot walk and manage at home with only one arm. Also possible elbow fracture. Seen by Larey Seat will need to follow with Dr. Tamera Punt here or as an inpatient or as an outpatient. Likely will require some placement. Unable to get him to the ER. Admit to internal medicine.  Final Clinical Impressions(s) / ED Diagnoses   Final diagnoses:  Closed fracture of proximal end of left humerus, unspecified fracture morphology, initial encounter    New Prescriptions New Prescriptions   No medications on file     Davonna Belling, MD 08/18/16 [phone]

## 2016-08-18 NOTE — ED Notes (Addendum)
Patient transported to CT 

## 2016-08-18 NOTE — ED Triage Notes (Signed)
Per EMS:  Pt is coming from home Pt was walking with her walker and fell against a dresser. Pt's nurse aid called [phone]. Pt has a significant deformity to left arm. Pain is 10/10.  Patient AXO x4  Vitals: BP 110/60 HR 78 RR 18 98% on RA  Pt has only taken her regular medication and no other pain meds.

## 2016-08-18 NOTE — ED Notes (Signed)
Pt is requesting additional pain medication.  EDP notified, await further orders.

## 2016-08-18 NOTE — Progress Notes (Addendum)
CSW spoke with patient regarding discharge plans. Patient states she is unable to get around due to fracture. Patient uses a walker while at home however is unable to do so now. CSW completed FL2 however PASRR number is currently under manual review. Patient cant not be placed in SNF without PASRR number-unsure when PASRR number will become available. Patient is awaiting PT evaluation for SNF/ [facility] recommendation. Patient states her mother was at [facility] ( now [place]) and if needed would prefer to go there. CSW will update EDP and 2nd shift CSW for follow up.   If PT recommends SNF, patient will need PASRR number and updated FL2.   Kingsley Spittle, LCSWA Clinical Social Worker [phone]

## 2016-08-18 NOTE — NC FL2 (Signed)
[place] LEVEL OF CARE SCREENING TOOL     IDENTIFICATION  Patient Name: Dominique Acosta Birthdate: 03-26-1951 Sex: female Admission Date (Current Location): 08/18/2016  [facility] and [place] Number:  Herbalist and Address:  [facility],  [place] [address] [place]      Provider Number: [id number]  Attending Physician Name and Address:  Davonna Belling, MD  Relative Name and Phone Number:       Current Level of Care: Hospital Recommended Level of Care: [place] Prior Approval Number:    Date Approved/Denied:   PASRR Number:    Discharge Plan: SNF    Current Diagnoses: Patient Active Problem List   Diagnosis Date Noted  . Acute metabolic encephalopathy 31/54/0086  . Acute lower UTI 08/03/2016  . Hypokalemia 08/03/2016  . Leukocytosis 08/03/2016  . Benign essential HTN 08/03/2016  . Schizoaffective disorder, bipolar type ([place]) 07/08/2016  . Hypothyroidism 06/08/2006    Orientation RESPIRATION BLADDER Height & Weight     Self, Time, Situation, Place  Normal Incontinent Weight: 178 lb (80.7 kg) Height:  5\' 1"  (154.9 cm)  BEHAVIORAL SYMPTOMS/MOOD NEUROLOGICAL BOWEL NUTRITION STATUS      Continent Diet (Regular)  AMBULATORY STATUS COMMUNICATION OF NEEDS Skin   Limited Assist Verbally Normal                       Personal Care Assistance Level of Assistance  Bathing, Dressing, Feeding Bathing Assistance: Maximum assistance Feeding assistance: Independent Dressing Assistance: Maximum assistance     Functional Limitations Info             SPECIAL CARE FACTORS FREQUENCY  PT (By licensed PT), OT (By licensed OT)     PT Frequency: 5 OT Frequency: 5            Contractures      Additional Factors Info  Code Status, Allergies Code Status Info: Prior Allergies Info: AMOXICILLIN, PENICILLINS, PHENYTOIN, DILAUDID HYDROMORPHONE HCL, HALDOL HALOPERIDOL, MORPHINE AND RELATED             Current Medications (08/18/2016):  This is the current hospital active medication list No current facility-administered medications for this encounter.    Current Outpatient Prescriptions  Medication Sig Dispense Refill  . acetaminophen-codeine (TYLENOL #3) 300-30 MG tablet Take 1 tablet by mouth every 8 (eight) hours as needed for moderate pain.    . ARIPiprazole (ABILIFY) 20 MG tablet Take 1 tablet (20 mg total) by mouth daily. 15 tablet 0  . baclofen (LIORESAL) 10 MG tablet Take 10 mg by mouth 3 (three) times daily as needed for muscle spasms.    . benztropine (COGENTIN) 2 MG tablet Take 2 mg by mouth 3 (three) times daily.    Marland Kitchen levothyroxine (SYNTHROID, LEVOTHROID) 88 MCG tablet Take 88 mcg by mouth daily before breakfast.    . LORazepam (ATIVAN) 1 MG tablet Take 1 mg by mouth 2 (two) times daily as needed for anxiety.    Marland Kitchen omeprazole (PRILOSEC) 40 MG capsule Take 40 mg by mouth daily.    . propranolol (INDERAL) 10 MG tablet Take 10 mg by mouth 2 (two) times daily.    . simvastatin (ZOCOR) 40 MG tablet Take 40 mg by mouth at bedtime.     . temazepam (RESTORIL) 30 MG capsule Take 30 mg by mouth at bedtime as needed for sleep.    Marland Kitchen venlafaxine XR (EFFEXOR-XR) 150 MG 24 hr capsule Take 300 mg by mouth daily  with breakfast.       Discharge Medications: Please see discharge summary for a list of discharge medications.  Relevant Imaging Results:  Relevant Lab Results:   Additional Information  SS#: [id number]  [name], LCSW

## 2016-08-18 NOTE — H&P (Signed)
History and Physical    Dominique Acosta TSV:[id number] DOB: [birth year] DOA: 08/18/2016  Referring MD/NP/PA: Dr. Alvino Chapel   PCP: Dominique Dy, MD   Patient coming from: home  Chief Complaint: left shoulder pain   HPI: Dominique Acosta is a 66 y.o. female with known COPD, CAD, seizures, hypothyroidism, multiple psychiatric diagnosis with resultant dyskinesia, presented to [facility] ED after she fell at home while she was going from her bedroom to change her clothes. She was using walker and her skirt got stuck under it and she tripped. She denies any prior symptoms of dizziness or chest pain, no dyspnea, no weakness. She currently explains her pain is mostly intermittent and worse with even minimal movement, she is unable to extend or flex her arm, pain is sharp and radiating to her fingers, pain is 10/10 in severity with no specific alleviating factors. Pt denies fevers, chills, no specific abd or urinary concerns. Pt explains her pain makes her sick to her stomach and she was unable to take much PO. While in ED, pt started to develop left sided chest pain, non radiating, pain medicine helps, pain is worse with movement.   ED Course: In ED, pt is hemodynamically stable, VSS, blood work notable for WBC 14 K, imaging studies notable for comminuted impacted intra-articular fracture of the left humeral head/surgical neck with mild posterior inferior subluxation. Ortho consulted and recommended consulting with Dr. Tamera Punt. Patient to be admitted for pain control, not responding to PO analgesia.   Review of Systems:  Constitutional: Negative for fever, chills, diaphoresis, activity change, appetite change and fatigue.  HENT: Negative for ear pain, nosebleeds, congestion, facial swelling, rhinorrhea, neck pain Eyes: Negative for pain, discharge, redness, itching and visual disturbance.  Respiratory: Negative for cough, choking, chest tightness, shortness of breath, wheezing and stridor.   Cardiovascular: Negative  for palpitations and leg swelling.  Gastrointestinal: Negative for abdominal distention.  Genitourinary: Negative for dysuria, urgency, frequency, hematuria, flank pain, decreased urine volume, difficulty urinating and dyspareunia.  Musculoskeletal: Negative for joint swelling, arthralgias  Neurological: Negative for dizziness, tremors, seizures, syncope, facial asymmetry  Hematological: Negative for adenopathy. Does not bruise/bleed easily.  Psychiatric/Behavioral: Negative for hallucinations, behavioral problems, confusion, dysphoric mood, decreased concentration and agitation.   Past Medical History:  Diagnosis Date  . Arm pain   . Chronic pain   . COPD (chronic obstructive pulmonary disease) ([place])   . Coronary artery disease   . Depression   . Dystonia   . Epilepsy ([place])   . High cholesterol   . Humerus fracture   . Hypothyroid   . Leukemia ([place])   . Myocardial infarct ([place])   . Schizophrenia ([place])   . Seizure [facility])     Past Surgical History:  Procedure Laterality Date  . EXTERNAL EAR SURGERY    . HUMERUS FRACTURE SURGERY    . SHOULDER SURGERY     Social Hx:  reports that she has been smoking Cigarettes.  She has a 82.00 pack-year smoking history. She has never used smokeless tobacco. She reports that she does not drink alcohol or use drugs.  Allergies  Allergen Reactions  . Amoxicillin Anaphylaxis and Other (See Comments)    Has patient had a PCN reaction causing immediate rash, facial/tongue/throat swelling, SOB or lightheadedness with hypotension: Yes Has patient had a PCN reaction causing severe rash involving mucus membranes or skin necrosis: No Has patient had a PCN reaction that required hospitalization No Has patient had a PCN reaction occurring within the last 10  years: No If all of the above answers are "NO", then may proceed with Cephalosporin use.  Marland Kitchen Penicillins Anaphylaxis and Other (See Comments)    Has patient had a PCN reaction causing immediate rash,  facial/tongue/throat swelling, SOB or lightheadedness with hypotension: Yes Has patient had a PCN reaction causing severe rash involving mucus membranes or skin necrosis: No Has patient had a PCN reaction that required hospitalization No Has patient had a PCN reaction occurring within the last 10 years: No If all of the above answers are "NO", then may proceed with Cephalosporin use.   Marland Kitchen Phenytoin Other (See Comments)    Reaction:  CNS disorder   . Dilaudid [Hydromorphone Hcl] Other (See Comments)    Reaction:  Psychosis   . Haldol [Haloperidol] Other (See Comments)    Reaction:  Psychosis   . Morphine And Related Other (See Comments)    Reaction:  Psychosis     No family history of cancers   Prior to Admission medications   Medication Sig Start Date End Date Taking? Authorizing Provider  acetaminophen-codeine (TYLENOL #3) 300-30 MG tablet Take 1 tablet by mouth every 8 (eight) hours as needed for moderate pain.   Yes [provider]  ARIPiprazole (ABILIFY) 20 MG tablet Take 1 tablet (20 mg total) by mouth daily. 08/07/16  Yes Arrien, Jimmy Picket, MD  baclofen (LIORESAL) 10 MG tablet Take 10 mg by mouth 3 (three) times daily as needed for muscle spasms.   Yes [provider]  benztropine (COGENTIN) 2 MG tablet Take 2 mg by mouth 3 (three) times daily.   Yes [provider]  levothyroxine (SYNTHROID, LEVOTHROID) 88 MCG tablet Take 88 mcg by mouth daily before breakfast.   Yes [provider]  LORazepam (ATIVAN) 1 MG tablet Take 1 mg by mouth 2 (two) times daily as needed for anxiety.   Yes [provider]  omeprazole (PRILOSEC) 40 MG capsule Take 40 mg by mouth daily.   Yes [provider]  propranolol (INDERAL) 10 MG tablet Take 10 mg by mouth 2 (two) times daily.   Yes [provider]  simvastatin (ZOCOR) 40 MG tablet Take 40 mg by mouth at bedtime.    Yes [provider]  temazepam (RESTORIL) 30 MG capsule Take  30 mg by mouth at bedtime as needed for sleep.   Yes [provider]  venlafaxine XR (EFFEXOR-XR) 150 MG 24 hr capsule Take 300 mg by mouth daily with breakfast.   Yes [provider]    Physical Exam: Vitals:   08/18/16 1122 08/18/16 1334 08/18/16 1500 08/18/16 1516  BP:  108/60 115/60 108/68  Pulse:  76 80 78  Resp:  14 14 16   Temp:    99 F (37.2 C)  TempSrc:    Oral  SpO2:  93% 93% 94%  Weight: 80.7 kg (178 lb)     Height: 5\' 1"  (1.549 m)       Constitutional: in mild distress due to pain  Vitals:   08/18/16 1122 08/18/16 1334 08/18/16 1500 08/18/16 1516  BP:  108/60 115/60 108/68  Pulse:  76 80 78  Resp:  14 14 16   Temp:    99 F (37.2 C)  TempSrc:    Oral  SpO2:  93% 93% 94%  Weight: 80.7 kg (178 lb)     Height: 5\' 1"  (1.549 m)      Eyes: PERRL, lids and conjunctivae normal ENMT: Mucous membranes are moderately dry. Posterior pharynx clear of  any exudate or lesions.Normal dentition.  Neck: normal, supple, no masses, no thyromegaly Respiratory: Normal respiratory effort. No accessory muscle use.  Cardiovascular: Regular rate and rhythm, no murmurs / rubs / gallops. 2+ pedal pulses. No carotid bruits.  Abdomen: no tenderness, no masses palpated. No hepatosplenomegaly. Bowel sounds positive.  Musculoskeletal: no clubbing / cyanosis. TTP in the left shoulder area, limited ROM due to pain, also some swelling at the left shoulder area Skin: no rashes, lesions, ulcers. No induration Neurologic: CN 2-12 grossly intact. Sensation intact, DTR normal. Strength 5/5 in all 4.  Psychiatric: Normal judgment and insight. Alert and oriented x 3.   Labs on Admission: I have personally reviewed following labs and imaging studies  CBC:  Recent Labs Lab 08/18/16 1200 08/18/16 1226  WBC  --  14.1*  NEUTROABS  --  11.0*  HGB 11.9* 12.0  HCT 35.0* 37.3  MCV  --  95.4  PLT  --  081   Basic Metabolic Panel:  Recent Labs Lab 08/18/16 1200  NA 136  K 4.4    CL 104  GLUCOSE 100*  BUN 28*  CREATININE 0.80   Urine analysis:    Component Value Date/Time   COLORURINE YELLOW 08/03/2016 0510   APPEARANCEUR HAZY (A) 08/03/2016 0510   LABSPEC 1.025 08/03/2016 0510   PHURINE 5.0 08/03/2016 0510   GLUCOSEU NEGATIVE 08/03/2016 0510   HGBUR NEGATIVE 08/03/2016 0510   HGBUR negative 09/16/2009 0954   BILIRUBINUR NEGATIVE 08/03/2016 0510   KETONESUR 80 (A) 08/03/2016 0510   PROTEINUR 30 (A) 08/03/2016 0510   UROBILINOGEN 0.2 09/05/2014 2126   NITRITE NEGATIVE 08/03/2016 0510   LEUKOCYTESUR NEGATIVE 08/03/2016 0510    Radiological Exams on Admission: Dg Elbow Complete Left Result Date: 08/18/2016 Curvilinear lucency through the coronoid process of the ulna, which may represent an avulsion fracture or an osteophyte. Please correlate to possible point tenderness.   Ct Cervical Spine Wo Contrast Result Date: 08/18/2016 No evidence of acute cervical spine injury. 2. Soft tissue swelling around the partly visualized left clavicle, consider radiography.   Dg Shoulder Left Result Date: 08/18/2016 Comminuted impacted intra-articular fracture of the left humeral head/surgical neck with mild posterior inferior subluxation, which may be due to intracapsular hematoma.   EKG: pending   Assessment/Plan Active Problems:   Humerus fracture - left humerus fracture, pain not controlled on oral analgesia and pt with poor oral intake - allow PO or IV analgesia as clinically indicated - keep on IVF for now until oral intake improved  - ortho consulted, assistance appreciated     Leukocytosis - no clear evidence of an infectious etiology  - could be reactive to acute fracture - CXR pending     Chest pain  - keep on tele - cycle CE's, 12 lead pending - CXR pending, also get dedicated ribs xray to rule out rib fractures     Hypothyroidism - continue synthroid  - check TSH    Depression - stable, continue home medical regimen   DVT prophylaxis:  Lovenox SQ Code Status: Full  Family Communication: Pt updated at bedside Disposition Plan: pending PT/OT eval , home vs SNF Consults called: Ortho  Admission status: Inpatient   Faye Ramsay MD [facility] Pager [phone]  If 7PM-7AM, please contact night-coverage [email or web address] Password [email or web address]  08/18/2016, 3:45 PM

## 2016-08-19 DIAGNOSIS — S42292A Other displaced fracture of upper end of left humerus, initial encounter for closed fracture: Secondary | ICD-10-CM | POA: Diagnosis not present

## 2016-08-19 DIAGNOSIS — E876 Hypokalemia: Secondary | ICD-10-CM | POA: Diagnosis not present

## 2016-08-19 DIAGNOSIS — E039 Hypothyroidism, unspecified: Secondary | ICD-10-CM

## 2016-08-19 DIAGNOSIS — F419 Anxiety disorder, unspecified: Secondary | ICD-10-CM | POA: Diagnosis not present

## 2016-08-19 DIAGNOSIS — S42355A Nondisplaced comminuted fracture of shaft of humerus, left arm, initial encounter for closed fracture: Secondary | ICD-10-CM | POA: Diagnosis not present

## 2016-08-19 DIAGNOSIS — S42202A Unspecified fracture of upper end of left humerus, initial encounter for closed fracture: Secondary | ICD-10-CM | POA: Diagnosis not present

## 2016-08-19 DIAGNOSIS — F329 Major depressive disorder, single episode, unspecified: Secondary | ICD-10-CM | POA: Diagnosis not present

## 2016-08-19 LAB — CBC
HEMATOCRIT: 30.4 % — AB (ref 36.0–46.0)
HEMOGLOBIN: 9.4 g/dL — AB (ref 12.0–15.0)
MCH: 29.9 pg (ref 26.0–34.0)
MCHC: 30.9 g/dL (ref 30.0–36.0)
MCV: 96.8 fL (ref 78.0–100.0)
PLATELETS: 203 10*3/uL (ref 150–400)
RBC: 3.14 MIL/uL — AB (ref 3.87–5.11)
RDW: 14.7 % (ref 11.5–15.5)
WBC: 9.3 10*3/uL (ref 4.0–10.5)

## 2016-08-19 LAB — PHOSPHORUS: PHOSPHORUS: 3.2 mg/dL (ref 2.5–4.6)

## 2016-08-19 LAB — BASIC METABOLIC PANEL
ANION GAP: 6 (ref 5–15)
BUN: 20 mg/dL (ref 6–20)
CO2: 23 mmol/L (ref 22–32)
Calcium: 6.7 mg/dL — ABNORMAL LOW (ref 8.9–10.3)
Chloride: 111 mmol/L (ref 101–111)
Creatinine, Ser: 0.62 mg/dL (ref 0.44–1.00)
Glucose, Bld: 80 mg/dL (ref 65–99)
POTASSIUM: 3.1 mmol/L — AB (ref 3.5–5.1)
SODIUM: 140 mmol/L (ref 135–145)

## 2016-08-19 LAB — TROPONIN I
Troponin I: <0.03 ng/mL (ref <0.03)
Troponin I: <0.03 ng/mL (ref <0.03)

## 2016-08-19 LAB — T4, FREE: Free T4: 0.34 ng/dL — ABNORMAL LOW (ref 0.61–1.12)

## 2016-08-19 LAB — MAGNESIUM: MAGNESIUM: 1.7 mg/dL (ref 1.7–2.4)

## 2016-08-19 MED ORDER — LEVOTHYROXINE SODIUM 100 MCG IV SOLR
20.0000 ug | Freq: Once | INTRAVENOUS | Status: AC
  Administered 2016-08-19: 20 ug via INTRAVENOUS
  Filled 2016-08-19: qty 5

## 2016-08-19 MED ORDER — POTASSIUM CHLORIDE 20 MEQ/15ML (10%) PO SOLN: 40.0000 meq | Freq: Two times a day (BID) | ORAL | Status: DC

## 2016-08-19 MED ORDER — POTASSIUM CHLORIDE CRYS ER 20 MEQ PO TBCR
40.0000 meq | EXTENDED_RELEASE_TABLET | Freq: Two times a day (BID) | ORAL | Status: AC
  Administered 2016-08-19 (×2): 40 meq via ORAL
  Filled 2016-08-19 (×2): qty 2

## 2016-08-19 NOTE — Progress Notes (Signed)
CSW assisting with d/c planning. Pt requesting placement at [facility] PASRR # is pending. SNF is able to accept pt once stable and pasrr # has been received. CSW will continue to follow to assist with dc planning.  Werner Lean LCSW [phone]

## 2016-08-19 NOTE — Care Management Note (Signed)
Case Management Note  Patient Details  Name: Dominique Acosta MRN: [id number] Date of Birth: 1950/06/26  Subjective/Objective:  66 y/o f admitted w/Humerus fx. Readmit-4/25-4/28-UTI.From home.Ortho-no sx. PT-recc SNF-CSW following w/passar.                  Action/Plan:d/c SNF.   Expected Discharge Date:   (unknown)               Expected Discharge Plan:  Skilled Nursing Facility  In-House Referral:  Clinical Social Work  Discharge planning Services  CM Consult  Post Acute Care Choice:    Choice offered to:     DME Arranged:    DME Agency:     HH Arranged:    [place] Agency:     Status of Service:  In process, will continue to follow  If discussed at Long Length of Stay Meetings, dates discussed:    Additional Comments:  Dessa Phi, RN 08/19/2016, 2:37 PM

## 2016-08-19 NOTE — Progress Notes (Signed)
Nutrition Brief Note  Patient identified on the Malnutrition Screening Tool (MST) Report  Pt with stable weight per chart. Pt ate 100% of breakfast this morning which provided ~900 kcal and 30g protein. She has ordered lunch as well. Pt is accepting Ensure supplements.  Wt Readings from Last 15 Encounters:  08/18/16 183 lb 10.3 oz (83.3 kg)  08/06/16 185 lb 4.8 oz (84.1 kg)  07/13/16 178 lb (80.7 kg)  10/27/15 175 lb (79.4 kg)  03/06/15 194 lb 4 oz (88.1 kg)  09/05/14 179 lb 7.3 oz (81.4 kg)  03/25/14 165 lb (74.8 kg)  03/21/14 165 lb (74.8 kg)  02/14/11 165 lb (74.8 kg)  01/26/11 157 lb 12.8 oz (71.6 kg)  01/20/11 164 lb 12.8 oz (74.8 kg)  12/21/10 168 lb (76.2 kg)  11/22/10 170 lb (77.1 kg)  10/12/10 184 lb (83.5 kg)  09/02/10 184 lb (83.5 kg)    Body mass index is 34.7 kg/m. Patient meets criteria for obesity based on current BMI.   Current diet order is regular, patient is consuming approximately 100% of meals at this time. Labs and medications reviewed.   No nutrition interventions warranted at this time. If nutrition issues arise, please consult RD.   Clayton Bibles, MS, RD, LDN Pager: [phone] After Hours Pager: [phone]

## 2016-08-19 NOTE — Care Management Obs Status (Signed)
[place] NOTIFICATION   Patient Details  Name: Dominique Acosta MRN: [id number] Date of Birth: 1950/06/23   Medicare Observation Status Notification Given:  Yes    MahabirJuliann Pulse, RN 08/19/2016, 3:40 PM

## 2016-08-19 NOTE — Progress Notes (Signed)
PROGRESS NOTE    Dominique Acosta  YQI:[id number] DOB: 1950-12-18 DOA: 08/18/2016 PCP: Lorene Dy, MD   Brief Narrative: Dominique Acosta is a 66 y.o. female with known COPD, CAD, seizures, hypothyroidism, multiple psychiatric diagnosis with resultant dyskinesia, presented to [facility] ED after she fell at home while she was going from her bedroom to change her clothes. She was using walker and her skirt got stuck under it and she tripped. She denies any prior symptoms of dizziness or chest pain, no dyspnea, no weakness. She currently explains her pain is mostly intermittent and worse with even minimal movement, she is unable to extend or flex her arm, pain is sharp and radiating to her fingers, pain is 10/10 in severity with no specific alleviating factors. Pt denies fevers, chills, no specific abd or urinary concerns. Pt explains her pain makes her sick to her stomach and she was unable to take much PO. While in ED, pt started to develop left sided chest pain, non radiating, pain medicine helps, pain is worse with movement. She was admitted for her humerus fracture and Orhopedics were consulted and recommended keeping patient in sling and outpatient follow up in clinic to evaluate need of surgery. Patient evaluated by PT who recommended SNF.   Assessment & Plan:   Active Problems:   Humerus fracture  Humerus Fracture and Possible Elbow Avulsion Fx - Shoulder Xray showed Comminuted impacted intra-articular fracture of the left humeral head/surgical neck with mild posterior inferior subluxation, which may be due to intracapsular hematoma. - Elbow Xray showed Curvilinear lucency through the coronoid process of the ulna, which may represent an avulsion fracture or an osteophyte.  - Left humerus fracture, pain was not controlled on oral analgesia and pt with poor oral intake - C/w IV Fentanyl 50-75 mcg q2hprn for Moderate Pain and Toradol 15 mg IV q6hprn for Severe Pain -C/w Tylenol 3 po q8hprn - Allow PO or IV  analgesia as clinically indicated - keep on IVF for now with NS at 75 mL/hr until oral intake improved  - Ortho consulted and discussed Case with Dr. Tamera Punt and recommended continuing Sling and Follow up as an outpatient for potential Surgery  Leukocytosis, improved - no clear evidence of an infectious etiology and was likely reactive and related to Acute fracture - Repeat CBC in AM  Hypokalemia -Patient's K+ was 3.1 -Replete with po KCl 40 mEQ BID -Repeat CMP in AM  Chest Pain r/o'd ACS  - keep on Tele - Cycleed CE's and were <0.03 x 3, 12 lead pending - No Rib Fractures Seen   Severe Hypothyroidism from non-compliance - TSH was 62.183 and Free T4 was 0.34 - Was given IV Levothryoxine 20 mcg in addition to po 88 mcg Daily - Repeat TSH and Free T4 - C/w 88 mcg of po Levothyrodixe  Depression/Anxiety/Psychiatric Issues including Schizophrenia - C/w Abilify 20 mg po daily, Benztropine 2 mg po TID, Lorazepam 1 mg po TIDprn, Venlafaxine 300 mg po Daily  COPD -Currently not in Exacerbation  Hyperlipidemia -C/w Simvastatin 40 mg po qHS  CAD -C/w Propanolol and Simvastatin   DVT prophylaxis: Lovenox 40 mg sq q24h Code Status: FULL CODE Family Communication: No family present at bedside Disposition Plan: SNF  Consultants:   Orthopedic Surgery Dr. Tamera Punt   Procedures: None  Antimicrobials:  Anti-infectives    None     Subjective: Seen and examined and was hurting in her arm. No nausea or vomiting. Had no CP today. Denied and SOB.  Objective: Vitals:  08/18/16 2204 08/19/16 0538 08/19/16 0656 08/19/16 1340  BP: 92/61 (!) 95/56 (!) 103/56 111/67  Pulse: 72 65  74  Resp: 16 18  16   Temp: 99.1 F (37.3 C) 98.6 F (37 C)  99.2 F (37.3 C)  TempSrc: Oral Oral  Oral  SpO2: 99% 97% 99% 96%  Weight:      Height:        Intake/Output Summary (Last 24 hours) at 08/19/16 1909 Last data filed at 08/19/16 3664  Gross per 24 hour  Intake           1167.5 ml   Output              250 ml  Net            917.5 ml   Filed Weights   08/18/16 1122 08/18/16 1727  Weight: 80.7 kg (178 lb) 83.3 kg (183 lb 10.3 oz)   Examination: Physical Exam:  Constitutional:  NAD and appears in some pain Eyes: Llids and conjunctivae normal, sclerae anicteric  ENMT: External Ears, Nose appear normal. Grossly normal hearing. Neck: Appears normal, supple, no cervical masses, normal ROM, no appreciable thyromegaly Respiratory: Clear to auscultation bilaterally, no wheezing, rales, rhonchi or crackles. Normal respiratory effort and patient is not tachypenic.  Cardiovascular: RRR, no murmurs / rubs / gallops. S1 and S2 auscultated. No extremity edema. Abdomen: Soft, non-tender, non-distended. No masses palpated. No appreciable hepatosplenomegaly. Bowel sounds positive x4.  GU: Deferred. Musculoskeletal: No clubbing / cyanosis of digits/nails. Left arm in sling and wrapped with ace bandage.  Skin: No rashes, lesions, ulcers. No induration; Warm and dry.  Neurologic: CN 2-12 grossly intact with no focal deficits. Sensation intact in all 4.Romberg sign cerebellar reflexes not assessed.  Psychiatric: Normal judgment and insight. Alert and oriented x 3. Normal mood and appropriate affect.   Data Reviewed: I have personally reviewed following labs and imaging studies  CBC:  Recent Labs Lab 08/18/16 1200 08/18/16 1226 08/18/16 1748 08/19/16 0505  WBC  --  14.1* 14.1* 9.3  NEUTROABS  --  11.0*  --   --   HGB 11.9* 12.0 11.9* 9.4*  HCT 35.0* 37.3 37.3 30.4*  MCV  --  95.4 95.4 96.8  PLT  --  279 300 403   Basic Metabolic Panel:  Recent Labs Lab 08/18/16 1200 08/18/16 1748 08/19/16 0505  NA 136  --  140  K 4.4  --  3.1*  CL 104  --  111  CO2  --   --  23  GLUCOSE 100*  --  80  BUN 28*  --  20  CREATININE 0.80 0.90 0.62  CALCIUM  --   --  6.7*  MG  --   --  1.7  PHOS  --   --  3.2   GFR: Estimated Creatinine Clearance: 66.8 mL/min (by C-G formula  based on SCr of 0.62 mg/dL). Liver Function Tests: No results for input(s): AST, ALT, ALKPHOS, BILITOT, PROT, ALBUMIN in the last 168 hours. No results for input(s): LIPASE, AMYLASE in the last 168 hours. No results for input(s): AMMONIA in the last 168 hours. Coagulation Profile: No results for input(s): INR, PROTIME in the last 168 hours. Cardiac Enzymes:  Recent Labs Lab 08/18/16 1748 08/18/16 2329 08/19/16 0505  TROPONINI <0.03 <0.03 <0.03   BNP (last 3 results) No results for input(s): PROBNP in the last 8760 hours. HbA1C: No results for input(s): HGBA1C in the last 72 hours. CBG: No results for  input(s): GLUCAP in the last 168 hours. Lipid Profile: No results for input(s): CHOL, HDL, LDLCALC, TRIG, CHOLHDL, LDLDIRECT in the last 72 hours. Thyroid Function Tests:  Recent Labs  08/18/16 1748 08/19/16 1220  TSH 62.183*  --   FREET4  --  0.34*   Anemia Panel: No results for input(s): VITAMINB12, FOLATE, FERRITIN, TIBC, IRON, RETICCTPCT in the last 72 hours. Sepsis Labs: No results for input(s): PROCALCITON, LATICACIDVEN in the last 168 hours.  No results found for this or any previous visit (from the past 240 hour(s)).   Radiology Studies: Dg Ribs Bilateral W/chest  Result Date: 08/18/2016 CLINICAL DATA:  Chest pain. Fall at home yesterday with bilateral rib pain. EXAM: BILATERAL RIBS AND CHEST - 4+ VIEW COMPARISON:  Chest radiograph 08/03/2016 FINDINGS: No fracture or other bone lesions are seen involving the ribs. There is no evidence of pneumothorax or pleural effusion. Both lungs are clear. Heart size and mediastinal contours are unchanged from prior exam. Low lung volumes persist. 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 IMPRESSION: No evidence of acute rib fracture. Electronically Signed   By: Jeb Levering M.D.   On: 08/18/2016 20:18   Dg Elbow Complete Left  Result Date: 08/18/2016 CLINICAL DATA:  Left shoulder and elbow pain status post fall. EXAM:  LEFT ELBOW - COMPLETE 3+ VIEW COMPARISON:  None. FINDINGS: There is a curvilinear lucency through the coronoid process of the ulna, which may represent un avulsion fracture or osteoarthritic osteophyte. The elbow is normally located. There is a mild soft tissue swelling. IMPRESSION: Curvilinear lucency through the coronoid process of the ulna, which may represent an avulsion fracture or an osteophyte. Please correlate to possible point tenderness. Electronically Signed   By: Fidela Salisbury M.D.   On: 08/18/2016 12:47   Ct Cervical Spine Wo Contrast  Result Date: 08/18/2016 CLINICAL DATA:  Neck pain after dresser fell on patient today. Initial encounter. EXAM: CT CERVICAL SPINE WITHOUT CONTRAST TECHNIQUE: Multidetector CT imaging of the cervical spine was performed without intravenous contrast. Multiplanar CT image reconstructions were also generated. COMPARISON:  None. FINDINGS: Alignment: No traumatic malalignment Skull base and vertebrae: Negative for acute fracture. Soft tissues and spinal canal: No prevertebral fluid or swelling. No visible canal hematoma. Disc levels: Multilevel facet arthropathy with bulky spurring. Disc degeneration greatest at C5-6 and C6-7. Bilateral foraminal narrowings. No indication of advanced spinal stenosis. Upper chest: Negative Other: Soft tissue stranding along the partly seen left clavicle. IMPRESSION: 1. No evidence of acute cervical spine injury. 2. Soft tissue swelling around the partly visualized left clavicle, consider radiography. Electronically Signed   By: Monte Fantasia M.D.   On: 08/18/2016 12:29   Dg Shoulder Left  Result Date: 08/18/2016 CLINICAL DATA:  Left shoulder pain, post fall. EXAM: LEFT SHOULDER - 2+ VIEW COMPARISON:  None. FINDINGS: There is a comminuted intra-articular fracture of the left humeral head with extension to the surgical neck. The fracture is impacted with posterior dislocation of the posterior fracture fragment. The humeral head is  inferiorly and posteriorly subluxed, which may be due to intra-articular hematoma. IMPRESSION: Comminuted impacted intra-articular fracture of the left humeral head/surgical neck with mild posterior inferior subluxation, which may be due to intracapsular hematoma. Electronically Signed   By: Fidela Salisbury M.D.   On: 08/18/2016 12:55   Scheduled Meds: . ARIPiprazole  20 mg Oral Daily  . benztropine  2 mg Oral TID  . enoxaparin (LOVENOX) injection  40 mg Subcutaneous Q24H  . feeding supplement (ENSURE ENLIVE)  237 mL Oral BID BM  . levothyroxine  88 mcg Oral QAC breakfast  . pantoprazole  40 mg Oral Daily  . potassium chloride  40 mEq Oral BID  . propranolol  10 mg Oral BID  . simvastatin  40 mg Oral QHS  . sodium chloride flush  3 mL Intravenous Q12H  . venlafaxine XR  300 mg Oral Q breakfast   Continuous Infusions: . sodium chloride 75 mL/hr at 08/18/16 1819    LOS: 1 day   Kerney Elbe, DO [facility] Pager [phone]  If 7PM-7AM, please contact night-coverage [email or web address] Password [email or web address] 08/19/2016, 7:09 PM

## 2016-08-19 NOTE — Care Management CC44 (Signed)
Condition Code 44 Documentation Completed  Patient Details  Name: Dominique Acosta MRN: [id number] Date of Birth: 27-Mar-1951   Condition Code 44 given:  Yes Patient signature on Condition Code 44 notice:  Yes Documentation of 2 MD's agreement:  Yes Code 44 added to claim:  Yes    Dessa Phi, RN 08/19/2016, 3:40 PM

## 2016-08-19 NOTE — Progress Notes (Signed)
OT Cancellation Note  Patient Details Name: Dominique Acosta MRN: [id number] DOB: 22-Nov-1950   Cancelled Treatment:    Reason Eval/Treat Not Completed: Other (comment).  Pt states she plans to have sx. Did not want to move at all at this time.  Did not want sling adjusted to fit her better either. Please reorder after sx. Thank you  [name] 08/19/2016, 10:26 AM  Lesle Chris, OTR/L [phone] 08/19/2016

## 2016-08-19 NOTE — Progress Notes (Signed)
OT Cancellation Note  Patient Details Name: Dominique Acosta MRN: [id number] DOB: [birth year]   Cancelled Treatment:    Reason Eval/Treat Not Completed: Other (comment). RN reports that pt will have OP ortho consult. She is not ready for me at this time. Will reattempt over weekend, as schedule permits.  [name] 08/19/2016, 12:25 PM  Lesle Chris, OTR/L [phone] 08/19/2016

## 2016-08-20 DIAGNOSIS — S42202A Unspecified fracture of upper end of left humerus, initial encounter for closed fracture: Secondary | ICD-10-CM | POA: Diagnosis not present

## 2016-08-20 DIAGNOSIS — S42292A Other displaced fracture of upper end of left humerus, initial encounter for closed fracture: Secondary | ICD-10-CM | POA: Diagnosis not present

## 2016-08-20 DIAGNOSIS — E876 Hypokalemia: Secondary | ICD-10-CM | POA: Diagnosis not present

## 2016-08-20 DIAGNOSIS — F419 Anxiety disorder, unspecified: Secondary | ICD-10-CM

## 2016-08-20 DIAGNOSIS — S42355A Nondisplaced comminuted fracture of shaft of humerus, left arm, initial encounter for closed fracture: Secondary | ICD-10-CM | POA: Diagnosis not present

## 2016-08-20 DIAGNOSIS — F329 Major depressive disorder, single episode, unspecified: Secondary | ICD-10-CM

## 2016-08-20 LAB — CBC WITH DIFFERENTIAL/PLATELET
BASOS ABS: 0 10*3/uL (ref 0.0–0.1)
Basophils Relative: 1 %
Eosinophils Absolute: 0.3 10*3/uL (ref 0.0–0.7)
Eosinophils Relative: 4 %
HEMATOCRIT: 30.3 % — AB (ref 36.0–46.0)
Hemoglobin: 9.5 g/dL — ABNORMAL LOW (ref 12.0–15.0)
LYMPHS PCT: 23 %
Lymphs Abs: 1.9 10*3/uL (ref 0.7–4.0)
MCH: 30.7 pg (ref 26.0–34.0)
MCHC: 31.4 g/dL (ref 30.0–36.0)
MCV: 98.1 fL (ref 78.0–100.0)
MONO ABS: 0.4 10*3/uL (ref 0.1–1.0)
Monocytes Relative: 5 %
NEUTROS ABS: 5.6 10*3/uL (ref 1.7–7.7)
Neutrophils Relative %: 67 %
Platelets: 225 10*3/uL (ref 150–400)
RBC: 3.09 MIL/uL — AB (ref 3.87–5.11)
RDW: 15 % (ref 11.5–15.5)
WBC: 8.2 10*3/uL (ref 4.0–10.5)

## 2016-08-20 LAB — COMPREHENSIVE METABOLIC PANEL
ALK PHOS: 57 U/L (ref 38–126)
ALT: 18 U/L (ref 14–54)
AST: 15 U/L (ref 15–41)
Albumin: 2.5 g/dL — ABNORMAL LOW (ref 3.5–5.0)
BILIRUBIN TOTAL: 0.5 mg/dL (ref 0.3–1.2)
BUN: 18 mg/dL (ref 6–20)
CALCIUM: 6.8 mg/dL — AB (ref 8.9–10.3)
CO2: 24 mmol/L (ref 22–32)
CREATININE: 0.54 mg/dL (ref 0.44–1.00)
Chloride: 113 mmol/L — ABNORMAL HIGH (ref 101–111)
GFR calc Af Amer: >60 mL/min (ref >60)
Glucose, Bld: 88 mg/dL (ref 65–99)
Potassium: 3.9 mmol/L (ref 3.5–5.1)
Sodium: 139 mmol/L (ref 135–145)
Total Protein: 5.1 g/dL — ABNORMAL LOW (ref 6.5–8.1)

## 2016-08-20 LAB — TSH: TSH: 46.443 u[IU]/mL — ABNORMAL HIGH (ref 0.350–4.500)

## 2016-08-20 LAB — T4, FREE: Free T4: 0.33 ng/dL — ABNORMAL LOW (ref 0.61–1.12)

## 2016-08-20 LAB — PHOSPHORUS: PHOSPHORUS: 2.1 mg/dL — AB (ref 2.5–4.6)

## 2016-08-20 LAB — MAGNESIUM: MAGNESIUM: 1.8 mg/dL (ref 1.7–2.4)

## 2016-08-20 MED ORDER — LORAZEPAM 1 MG PO TABS: 1.0000 mg | ORAL_TABLET | Freq: Two times a day (BID) | ORAL | 0 refills | Status: DC | PRN

## 2016-08-20 MED ORDER — POTASSIUM PHOSPHATE MONOBASIC 500 MG PO TABS
500.0000 mg | ORAL_TABLET | Freq: Three times a day (TID) | ORAL | Status: DC
  Administered 2016-08-20: 500 mg via ORAL
  Filled 2016-08-20 (×2): qty 1

## 2016-08-20 MED ORDER — ACETAMINOPHEN-CODEINE #3 300-30 MG PO TABS: 1.0000 | ORAL_TABLET | Freq: Three times a day (TID) | ORAL | 0 refills | Status: DC | PRN

## 2016-08-20 MED ORDER — ENSURE ENLIVE PO LIQD: 237.0000 mL | Freq: Two times a day (BID) | ORAL | 12 refills | Status: DC

## 2016-08-20 NOTE — Progress Notes (Signed)
Report given to RN Claiborne Rigg at [place] via telephone.

## 2016-08-20 NOTE — Clinical Social Work Note (Signed)
Clinical Social Work Assessment  Patient Details  Name: Dominique Acosta MRN: [id number] Date of Birth: 1950-09-02  Date of referral:  08/18/16               Reason for consult:  Facility Placement                Permission sought to share information with:  Family Supports Permission granted to share information::  No  Name::        Agency::     Relationship::     Contact Information:     Housing/Transportation Living arrangements for the past 2 months:  Apartment Source of Information:  Patient Patient Interpreter Needed:  None Criminal Activity/Legal Involvement Pertinent to Current Situation/Hospitalization:  No - Comment as needed Significant Relationships:  Siblings Lives with:  Self Do you feel safe going back to the place where you live?  No (Patient in agreement with [facility]) Need for family participation in patient care:  No (Coment)  Care giving concerns:  Patient lives alone and is in agreement with [place] rehab.   Social Worker assessment / plan:  CSW talked with patient at the bedside. She was sitting up in bed eating, however was open to talking with CSW. Patient again confirmed that she is going to [facility] and informed CSW that she gets aide services at home. Patient expressed dissatisfaction with the person who comes to her home now and this was discussed.   Employment status:  Retired Forensic scientist:  Information systems manager, Medicaid In [place] PT Recommendations:    Information / Referral to community resources:  Page (Patient's preference is [facility])  Patient/Family's Response to care:  No concerns expressed by patient regarding care during hospitalization..  Patient/Family's Understanding of and Emotional Response to Diagnosis, Current Treatment, and Prognosis:  Not discussed.  Emotional Assessment Appearance:  Appears stated age Attitude/Demeanor/Rapport:  Other (Appropriate) Affect (typically observed):  Appropriate Orientation:  Oriented to  Self, Oriented to Place, Oriented to  Time, Oriented to Situation Alcohol / Substance use:  Tobacco Use, Alcohol Use, Illicit Drugs (Patient reported that she smokes and does not drink or use illicit drugs) Psych involvement (Current and /or in the community):  No (Comment)  Discharge Needs  Concerns to be addressed:  Discharge Planning Concerns Readmission within the last 30 days:  Yes Current discharge risk:  None Barriers to Discharge:  No Barriers Identified   Sable Feil, LCSW 08/20/2016, 4:54 PM

## 2016-08-20 NOTE — Evaluation (Signed)
Occupational Therapy Evaluation Patient Details Name: Dominique Acosta MRN: [id number] DOB: October 18, 1950 Today's Date: 08/20/2016    History of Present Illness 66 yo female admitted to ED after falling at home. PT sustained L prox humerus fx and possible L elbow fx. Hx of bipolar d/o, schizoaffective disorder, depression, dystonia (per pt report)   Clinical Impression   Pt was admitted for the above.  Pt is in a sling and will consider sx as an OP.  Pain limits her ability to perform ADLs.  Pt more receptive to OT today, but she still self-limits tx due to pain.  She was mod I prior to fall.  Will follow in acute setting with goals set below    Follow Up Recommendations  SNF    Equipment Recommendations  None recommended by OT (pt not ready for 3:1 commode yet)    Recommendations for Other Services       Precautions / Restrictions Precautions Precautions: Fall Precaution Comments: shoulder fx: in sling Restrictions Weight Bearing Restrictions: No      Mobility Bed Mobility                  Transfers                      Balance                                           ADL either performed or assessed with clinical judgement   ADL Overall ADL's : Needs assistance/impaired Eating/Feeding: Set up;Bed level   Grooming: Minimal assistance;Bed level   Upper Body Bathing: Maximal assistance;Bed level   Lower Body Bathing: Total assistance;+2 for physical assistance;Bed level   Upper Body Dressing : Maximal assistance;Bed level   Lower Body Dressing: Total assistance;+2 for physical assistance;Bed level                 General ADL Comments: pt did not want to move at all.  She has been rolling/briding for bedpan. She did allow me to adjust sling as it was not supporting her, but she took strap off once I had secured it.  Placed a small pillow under her arm,, applied ice and educated her to move fingers as she has edema     Vision         Perception     Praxis      Pertinent Vitals/Pain Pain Assessment: 0-10 Pain Score: 7  Pain Location: R shoulder Pain Descriptors / Indicators: Aching Pain Intervention(s): Limited activity within patient's tolerance;Monitored during session;Ice applied     Hand Dominance     Extremity/Trunk Assessment Upper Extremity Assessment RUE Deficits / Details: h/o shoulder sx. Can lift to 90, reach to hair LUE Deficits / Details: immobilized in sling. Able to wiggle fingers           Communication Communication Communication: No difficulties   Cognition Arousal/Alertness: Awake/alert Behavior During Therapy: WFL for tasks assessed/performed Overall Cognitive Status: Within Functional Limits for tasks assessed                                     General Comments       Exercises     Shoulder Instructions      Home Living Family/patient expects to be discharged to:: Skilled  nursing facility Living Arrangements: Alone                                      Prior Functioning/Environment Level of Independence: Independent with assistive device(s)                 OT Problem List: Decreased strength;Decreased activity tolerance;Decreased knowledge of use of DME or AE;Impaired UE functional use;Pain      OT Treatment/Interventions: Self-care/ADL training;DME and/or AE instruction;Patient/family education;Therapeutic activities (balance NT)    OT Goals(Current goals can be found in the care plan section) Acute Rehab OT Goals Patient Stated Goal: less pain OT Goal Formulation: With patient Time For Goal Achievement: 08/27/16 Potential to Achieve Goals: Good ADL Goals Pt Will Transfer to Toilet: with max assist;with +2 assist;bedside commode;stand pivot transfer Additional ADL Goal #1: pt will use LUE to hold containers to open for self feeding and grooming Additional ADL Goal #2: pt will perform bed mobility with max +2 assistance for  toileting/ADLs Additional ADL Goal #3: pt will move fingers during session without cues for edema management  OT Frequency: Min 2X/week   Barriers to D/C:            Co-evaluation              AM-PAC PT "6 Clicks" Daily Activity     Outcome Measure Help from another person eating meals?: A Little Help from another person taking care of personal grooming?: A Little Help from another person toileting, which includes using toliet, bedpan, or urinal?: Total Help from another person bathing (including washing, rinsing, drying)?: A Lot Help from another person to put on and taking off regular upper body clothing?: A Lot Help from another person to put on and taking off regular lower body clothing?: Total 6 Click Score: 12   End of Session    Activity Tolerance: Patient limited by pain Patient left: in bed;with call bell/phone within reach  OT Visit Diagnosis: Pain;Muscle weakness (generalized) (M62.81) Pain - Right/Left: Left Pain - part of body: Shoulder                Time: 3354-5625 OT Time Calculation (min): 13 min Charges:  OT General Charges $OT Visit: 1 Procedure OT Evaluation $OT Eval Low Complexity: 1 Procedure G-Codes: OT G-codes **NOT FOR INPATIENT CLASS** Functional Assessment Tool Used: Clinical judgement Functional Limitation: Self care Self Care Current Status (W3893): 100 percent impaired, limited or restricted Self Care Goal Status (T3428): At least 60 percent but less than 80 percent impaired, limited or restricted   Lesle Chris, OTR/L [phone] 08/20/2016  Sanjana Folz 08/20/2016, 9:12 AM

## 2016-08-20 NOTE — Progress Notes (Signed)
Pt was lying in bed, awake and alert when I arrived. She wanted prayer for her body. She said she could not move the upper part of her body. In addition she wanted prayer for her anxiety. She said she is leaving for rehab today; but confessed she is not going to "push" herself saying she was tired. 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 She admitted it may be contributing to her anxiety. Pt was grateful for visit and prayer. Please page if additional support is needed. [place], M.Div   08/20/16 1700  Clinical Encounter Type  Visited With Patient  Visit Type Initial  Referral From Nurse

## 2016-08-20 NOTE — Clinical Social Work Placement (Signed)
   CLINICAL SOCIAL WORK PLACEMENT  NOTE 08/20/16 - DISCHARGED TO [place], TRANSPORTED BY AMBULANCE  Date:  08/20/2016  Patient Details  Name: Dominique Acosta MRN: [id number] Date of Birth: June 03, 1950  Clinical Social Work is seeking post-discharge placement for this patient at the [place] level of care (*CSW will initial, date and re-position this form in  chart as items are completed):  Yes   Patient/family provided with [facility] Work Department's list of facilities offering this level of care within the geographic area requested by the patient (or if unable, by the patient's family).  Yes   Patient/family informed of their freedom to choose among providers that offer the needed level of care, that participate in Medicare, Medicaid or managed care program needed by the patient, have an available bed and are willing to accept the patient.  No   Patient/family informed of [facility]'s ownership interest in [facility] and [facility], as well as of the fact that they are under no obligation to receive care at these facilities.  PASRR submitted to EDS on       PASRR number received on 08/19/16 ([id number] E - Expiration 09/18/16)     Existing PASRR number confirmed on       FL2 transmitted to all facilities in geographic area requested by pt/family on       FL2 transmitted to all facilities within larger geographic area on       Patient informed that his/her managed care company has contracts with or will negotiate with certain facilities, including the following:        Yes   Patient/family informed of bed offers received.  Patient chooses bed at [facility]     Physician recommends and patient chooses bed at      Patient to be transferred to [facility] on 08/20/16.  Patient to be transferred to facility by ambulance     Patient family notified on   of transfer.  Name of family  member notified:        PHYSICIAN       Additional Comment:    _______________________________________________ Sable Feil, LCSW 08/20/2016, 5:04 PM

## 2016-08-20 NOTE — Discharge Summary (Signed)
Physician Discharge Summary  Dominique Acosta NWG:[id number] DOB: 1950/06/19 DOA: 08/18/2016  PCP: Lorene Dy, MD  Admit date: 08/18/2016 Discharge date: 08/20/2016  Admitted From: Home Disposition:  SNF  Recommendations for Outpatient Follow-up:  1. Follow up with PCP in 1-2 weeks 2. Follow up with Orthopedics Dr. Malena Catholic for evaluation next week to be evaluated for possible ORIF vs. Hemiarthroplasty 3. Continue Sling and NWB and continue Elbow Splint  4. Please obtain CMP/CBC in one week 5. Check TSH and Free T4 in 3-6 Weeks   Home Health: No Equipment/Devices: None  Discharge Condition: Stable CODE STATUS: FULL CODE Diet recommendation: Regular  Brief/Interim Summary: Dominique Acosta a 66 y.o.femalewith known COPD, CAD, seizures, hypothyroidism, multiple psychiatric diagnosis with resultant dyskinesia, presented to [facility] ED after she fell at home while she was going from her bedroom to change her clothes. She was using a walker and her skirt got stuck under it and she tripped. She denies any prior symptoms of dizziness or chest pain, no dyspnea, no weakness. She currently explained her pain is mostly intermittent and worse with even minimal movement, she is unable to extend or flex her arm, pain is sharp and radiating to her fingers, pain is 10/10 in severity with no specific alleviating factors. Pt denies fevers, chills, no specific abd or urinary concerns. . While in ED, pt started to develop left sided chest pain, non radiating, pain medicine helps, pain is worse with movement. She was admitted for her humerus fracture and Orhopedics were consulted and recommended keeping patient in sling and outpatient follow up in clinic to evaluate need of surgery. Patient evaluated by PT who recommended SNF. Patient's appetite got better and pain was under control. CP resolved and patient feeling better. At this time patient is deemed medically stable to transfer to SNF and will be evaluated in the  outpatient setting by Orthopedic Surgery.   Discharge Diagnoses:  Active Problems:   Humerus fracture  Humerus Fracture and Possible Elbow Avulsion Fx - Shoulder Xray showed Comminuted impacted intra-articular fracture of the left humeral head/surgical neck with mild posterior inferior subluxation, which may be due to intracapsular hematoma. - Elbow Xray showed Curvilinear lucency through the coronoid process of the ulna, which may represent an avulsion fracture or an osteophyte.  - Left humerus fracture, pain was not controlled on oral analgesia and pt with poor oral intake - Was on IV Fentanyl 50-75 mcg q2hprn for Moderate Pain and Toradol 15 mg IV q6hprn for Severe Pain -C/w Tylenol 3 po q8hprn for po Pain Control -- Ortho consulted and discussed Case with Dr. Tamera Punt and recommended continuing Sling and Follow up as an outpatient for potential Surgery next week  Leukocytosis, improved - WBC went from 14.1 -> 9.3 -> 8.2 - no clear evidence of an infectious etiology and was likely reactive and related to Acute fracture - Repeat CBC at SNF  Hypokalemia -Patient's K+ was 3.1 and improved to 3.9 -Repleteed with po KCl 40 mEQ BID -Repeat CMP at SNF  Chest Pain r/o'd ACS  - Kept on Telemetry - Cycled CE's and were <0.03 x 3, 12 Lead EKG showed Normal Sinus Rhythm - No Rib Fractures Seen   Severe Hypothyroidism from non-compliance - TSH was 62.183 and Free T4 was 0.34 on admission - TSH improved to 46.443 and Free T4 was 0.33 - Repeat TSH and Free T4 in 3-6 weeks - C/w 88 mcg of po Levothyroxine  Depression/Anxiety/Psychiatric Issues including Schizophrenia - C/w Abilify 20 mg po  daily, Benztropine 2 mg po TID, Lorazepam 1 mg po TIDprn, Venlafaxine 300 mg po Daily  COPD -Currently not in Exacerbation  Hyperlipidemia -C/w Simvastatin 40 mg po qHS  CAD -C/w Propanolol and Simvastatin   Hypophosphatemia -Patient's Phos Level was 2.1 this AM -Replete with 500 mg of  K Phos x3 doses -Repeat Phos Level at SNF  Discharge Instructions  Discharge Instructions    Call MD for:  difficulty breathing, headache or visual disturbances    Complete by:  As directed    Call MD for:  extreme fatigue    Complete by:  As directed    Call MD for:  persistant dizziness or light-headedness    Complete by:  As directed    Call MD for:  persistant nausea and vomiting    Complete by:  As directed    Call MD for:  severe uncontrolled pain    Complete by:  As directed    Call MD for:  temperature >100.4    Complete by:  As directed    Diet - low sodium heart healthy    Complete by:  As directed    Discharge instructions    Complete by:  As directed    Follow up Care at SNF and follow up with PCP and Orthopedics Dr. Tamera Punt as an outpatient. If symptoms change or worsen please return to the ED for evaluation.   Increase activity slowly    Complete by:  As directed      Allergies as of 08/20/2016      Reactions   Amoxicillin Anaphylaxis, Other (See Comments)   Has patient had a PCN reaction causing immediate rash, facial/tongue/throat swelling, SOB or lightheadedness with hypotension: Yes Has patient had a PCN reaction causing severe rash involving mucus membranes or skin necrosis: No Has patient had a PCN reaction that required hospitalization No Has patient had a PCN reaction occurring within the last 10 years: No If all of the above answers are "NO", then may proceed with Cephalosporin use.   Penicillins Anaphylaxis, Other (See Comments)   Has patient had a PCN reaction causing immediate rash, facial/tongue/throat swelling, SOB or lightheadedness with hypotension: Yes Has patient had a PCN reaction causing severe rash involving mucus membranes or skin necrosis: No Has patient had a PCN reaction that required hospitalization No Has patient had a PCN reaction occurring within the last 10 years: No If all of the above answers are "NO", then may proceed with  Cephalosporin use.   Phenytoin Other (See Comments)   Reaction:  CNS disorder    Dilaudid [hydromorphone Hcl] Other (See Comments)   Reaction:  Psychosis    Haldol [haloperidol] Other (See Comments)   Reaction:  Psychosis    Morphine And Related Other (See Comments)   Reaction:  Psychosis       Medication List    TAKE these medications   acetaminophen-codeine 300-30 MG tablet Commonly known as:  TYLENOL #3 Take 1 tablet by mouth every 8 (eight) hours as needed for moderate pain.   ARIPiprazole 20 MG tablet Commonly known as:  ABILIFY Take 1 tablet (20 mg total) by mouth daily.   baclofen 10 MG tablet Commonly known as:  LIORESAL Take 10 mg by mouth 3 (three) times daily as needed for muscle spasms.   benztropine 2 MG tablet Commonly known as:  COGENTIN Take 2 mg by mouth 3 (three) times daily.   feeding supplement (ENSURE ENLIVE) Liqd Take 237 mLs by mouth 2 (two) times  daily between meals.   levothyroxine 88 MCG tablet Commonly known as:  SYNTHROID, LEVOTHROID Take 88 mcg by mouth daily before breakfast.   LORazepam 1 MG tablet Commonly known as:  ATIVAN Take 1 tablet (1 mg total) by mouth 2 (two) times daily as needed for anxiety.   omeprazole 40 MG capsule Commonly known as:  PRILOSEC Take 40 mg by mouth daily.   propranolol 10 MG tablet Commonly known as:  INDERAL Take 10 mg by mouth 2 (two) times daily.   simvastatin 40 MG tablet Commonly known as:  ZOCOR Take 40 mg by mouth at bedtime.   temazepam 30 MG capsule Commonly known as:  RESTORIL Take 30 mg by mouth at bedtime as needed for sleep.   venlafaxine XR 150 MG 24 hr capsule Commonly known as:  EFFEXOR-XR Take 300 mg by mouth daily with breakfast.      Follow-up Information    Tania Ade, MD. Schedule an appointment as soon as possible for a visit.   Specialty:  Orthopedic Surgery Why:  Make appointment for 5/11 or 5/14 Contact information: [address] [place]  [id number] [phone]          Allergies  Allergen Reactions  . Amoxicillin Anaphylaxis and Other (See Comments)    Has patient had a PCN reaction causing immediate rash, facial/tongue/throat swelling, SOB or lightheadedness with hypotension: Yes Has patient had a PCN reaction causing severe rash involving mucus membranes or skin necrosis: No Has patient had a PCN reaction that required hospitalization No Has patient had a PCN reaction occurring within the last 10 years: No If all of the above answers are "NO", then may proceed with Cephalosporin use.  Marland Kitchen Penicillins Anaphylaxis and Other (See Comments)    Has patient had a PCN reaction causing immediate rash, facial/tongue/throat swelling, SOB or lightheadedness with hypotension: Yes Has patient had a PCN reaction causing severe rash involving mucus membranes or skin necrosis: No Has patient had a PCN reaction that required hospitalization No Has patient had a PCN reaction occurring within the last 10 years: No If all of the above answers are "NO", then may proceed with Cephalosporin use.   Marland Kitchen Phenytoin Other (See Comments)    Reaction:  CNS disorder   . Dilaudid [Hydromorphone Hcl] Other (See Comments)    Reaction:  Psychosis   . Haldol [Haloperidol] Other (See Comments)    Reaction:  Psychosis   . Morphine And Related Other (See Comments)    Reaction:  Psychosis     Consultations:  Orthopedic Surgery  Procedures/Studies: Dg Chest 2 View  Result Date: 07/29/2016 CLINICAL DATA:  Low-grade fevers EXAM: CHEST  2 VIEW COMPARISON:  07/28/2016 FINDINGS: Cardiac shadow is at the upper limits of normal in size. The lungs are hypoinflated with some crowding of the vascular markings. This is stable from the prior exam. Mild left basilar atelectasis is noted. No sizable effusion is seen. Postsurgical changes in the right shoulder are noted. IMPRESSION: Stable hypoinflation and mild left basilar atelectasis. Electronically Signed   By: Inez Catalina M.D.   On: 07/29/2016 07:57   Dg Chest 2 View  Result Date: 07/28/2016 CLINICAL DATA:  Mental status changes. EXAM: CHEST  2 VIEW COMPARISON:  06/24/2016 FINDINGS: Low volume film. The cardio pericardial silhouette is enlarged. Underlying interstitial changes again noted pneumonia at the left lung base cannot be entirely excluded. Patient is status post right shoulder replacement. IMPRESSION: Cardiomegaly with low volumes and potential airspace disease/ pneumonia  at the left base. Electronically Signed   By: Misty Stanley M.D.   On: 07/28/2016 20:26   Dg Ribs Bilateral W/chest  Result Date: 08/18/2016 CLINICAL DATA:  Chest pain. Fall at home yesterday with bilateral rib pain. EXAM: BILATERAL RIBS AND CHEST - 4+ VIEW COMPARISON:  Chest radiograph 08/03/2016 FINDINGS: No fracture or other bone lesions are seen involving the ribs. There is no evidence of pneumothorax or pleural effusion. Both lungs are clear. Heart size and mediastinal contours are unchanged from prior exam. Low lung volumes persist. 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 IMPRESSION: No evidence of acute rib fracture. Electronically Signed   By: Jeb Levering M.D.   On: 08/18/2016 20:18   Dg Elbow Complete Left  Result Date: 08/18/2016 CLINICAL DATA:  Left shoulder and elbow pain status post fall. EXAM: LEFT ELBOW - COMPLETE 3+ VIEW COMPARISON:  None. FINDINGS: There is a curvilinear lucency through the coronoid process of the ulna, which may represent un avulsion fracture or osteoarthritic osteophyte. The elbow is normally located. There is a mild soft tissue swelling. IMPRESSION: Curvilinear lucency through the coronoid process of the ulna, which may represent an avulsion fracture or an osteophyte. Please correlate to possible point tenderness. Electronically Signed   By: Fidela Salisbury M.D.   On: 08/18/2016 12:47   Ct Head Wo Contrast  Result Date: 07/28/2016 CLINICAL DATA:  Schizophrenia. Altered mental status.  Disorganized speech. EXAM: CT HEAD WITHOUT CONTRAST TECHNIQUE: Contiguous axial images were obtained from the base of the skull through the vertex without intravenous contrast. COMPARISON:  10/27/2015 FINDINGS: Brain: Mild generalized brain atrophy. No evidence of old or acute focal infarction, mass lesion, hemorrhage, hydrocephalus or extra-axial collection. Vascular: There is atherosclerotic calcification of the major vessels at the base of the brain. Skull: Normal Sinuses/Orbits: Clear/normal Other: None IMPRESSION: Normal head CT for age. Electronically Signed   By: Nelson Chimes M.D.   On: 07/28/2016 14:39   Ct Cervical Spine Wo Contrast  Result Date: 08/18/2016 CLINICAL DATA:  Neck pain after dresser fell on patient today. Initial encounter. EXAM: CT CERVICAL SPINE WITHOUT CONTRAST TECHNIQUE: Multidetector CT imaging of the cervical spine was performed without intravenous contrast. Multiplanar CT image reconstructions were also generated. COMPARISON:  None. FINDINGS: Alignment: No traumatic malalignment Skull base and vertebrae: Negative for acute fracture. Soft tissues and spinal canal: No prevertebral fluid or swelling. No visible canal hematoma. Disc levels: Multilevel facet arthropathy with bulky spurring. Disc degeneration greatest at C5-6 and C6-7. Bilateral foraminal narrowings. No indication of advanced spinal stenosis. Upper chest: Negative Other: Soft tissue stranding along the partly seen left clavicle. IMPRESSION: 1. No evidence of acute cervical spine injury. 2. Soft tissue swelling around the partly visualized left clavicle, consider radiography. Electronically Signed   By: Monte Fantasia M.D.   On: 08/18/2016 12:29   Dg Chest Portable 1 View  Result Date: 08/03/2016 CLINICAL DATA:  Altered mental status EXAM: PORTABLE CHEST 1 VIEW COMPARISON:  Chest radiograph 08/06/2016 FINDINGS: Unchanged cardiomediastinal contours with shallow lung inflation. No focal airspace consolidation or  pulmonary edema. Right shoulder arthroplasty again noted. IMPRESSION: No active disease. Electronically Signed   By: Ulyses Jarred M.D.   On: 08/03/2016 05:32   Dg Shoulder Left  Result Date: 08/18/2016 CLINICAL DATA:  Left shoulder pain, post fall. EXAM: LEFT SHOULDER - 2+ VIEW COMPARISON:  None. FINDINGS: There is a comminuted intra-articular fracture of the left humeral head with extension to the surgical neck. The fracture is impacted with posterior  dislocation of the posterior fracture fragment. The humeral head is inferiorly and posteriorly subluxed, which may be due to intra-articular hematoma. IMPRESSION: Comminuted impacted intra-articular fracture of the left humeral head/surgical neck with mild posterior inferior subluxation, which may be due to intracapsular hematoma. Electronically Signed   By: Fidela Salisbury M.D.   On: 08/18/2016 12:55     Subjective: Seen and examined at bedside and was feeling much better. No nausea or vomiting but was in pain when she was being moved. Avoided trying to be moved. States appetite is better. No other concerns or complaints at this time.   Discharge Exam: Vitals:   08/20/16 0428 08/20/16 0912  BP: 95/61 120/63  Pulse: 69 69  Resp: 16   Temp: 98.6 F (37 C)    Vitals:   08/19/16 1340 08/19/16 2022 08/20/16 0428 08/20/16 0912  BP: 111/67 (!) 116/51 95/61 120/63  Pulse: 74 74 69 69  Resp: 16 18 16    Temp: 99.2 F (37.3 C) 98.9 F (37.2 C) 98.6 F (37 C)   TempSrc: Oral Oral Oral   SpO2: 96% 94% 96%   Weight:      Height:       General: Pt is alert, awake, not in acute distress Cardiovascular: RRR, S1/S2 +, no rubs, no gallops Respiratory: CTA bilaterally, no wheezing, no rhonchi Abdominal: Soft, NT, ND, bowel sounds + Extremities: no edema, no cyanosis; Left arm in sling and wrapped in ACE Bandage; Shoulder had some bruising.  The results of significant diagnostics from this hospitalization (including imaging, microbiology,  ancillary and laboratory) are listed below for reference.    Microbiology: No results found for this or any previous visit (from the past 240 hour(s)).   Labs: BNP (last 3 results) No results for input(s): BNP in the last 8760 hours. Basic Metabolic Panel:  Recent Labs Lab 08/18/16 1200 08/18/16 1748 08/19/16 0505 08/20/16 0930  NA 136  --  140 139  K 4.4  --  3.1* 3.9  CL 104  --  111 113*  CO2  --   --  23 24  GLUCOSE 100*  --  80 88  BUN 28*  --  20 18  CREATININE 0.80 0.90 0.62 0.54  CALCIUM  --   --  6.7* 6.8*  MG  --   --  1.7 1.8  PHOS  --   --  3.2 2.1*   Liver Function Tests:  Recent Labs Lab 08/20/16 0930  AST 15  ALT 18  ALKPHOS 57  BILITOT 0.5  PROT 5.1*  ALBUMIN 2.5*   No results for input(s): LIPASE, AMYLASE in the last 168 hours. No results for input(s): AMMONIA in the last 168 hours. CBC:  Recent Labs Lab 08/18/16 1200 08/18/16 1226 08/18/16 1748 08/19/16 0505 08/20/16 0930  WBC  --  14.1* 14.1* 9.3 8.2  NEUTROABS  --  11.0*  --   --  5.6  HGB 11.9* 12.0 11.9* 9.4* 9.5*  HCT 35.0* 37.3 37.3 30.4* 30.3*  MCV  --  95.4 95.4 96.8 98.1  PLT  --  279 300 203 225   Cardiac Enzymes:  Recent Labs Lab 08/18/16 1748 08/18/16 2329 08/19/16 0505  TROPONINI <0.03 <0.03 <0.03   BNP: Invalid input(s): POCBNP CBG: No results for input(s): GLUCAP in the last 168 hours. D-Dimer No results for input(s): DDIMER in the last 72 hours. Hgb A1c No results for input(s): HGBA1C in the last 72 hours. Lipid Profile No results for input(s): CHOL, HDL,  LDLCALC, TRIG, CHOLHDL, LDLDIRECT in the last 72 hours. Thyroid function studies  Recent Labs  08/20/16 0930  TSH 46.443*   Anemia work up No results for input(s): VITAMINB12, FOLATE, FERRITIN, TIBC, IRON, RETICCTPCT in the last 72 hours. Urinalysis    Component Value Date/Time   COLORURINE YELLOW 08/03/2016 0510   APPEARANCEUR HAZY (A) 08/03/2016 0510   LABSPEC 1.025 08/03/2016 0510    PHURINE 5.0 08/03/2016 0510   GLUCOSEU NEGATIVE 08/03/2016 0510   HGBUR NEGATIVE 08/03/2016 0510   HGBUR negative 09/16/2009 0954   BILIRUBINUR NEGATIVE 08/03/2016 0510   KETONESUR 80 (A) 08/03/2016 0510   PROTEINUR 30 (A) 08/03/2016 0510   UROBILINOGEN 0.2 09/05/2014 2126   NITRITE NEGATIVE 08/03/2016 0510   LEUKOCYTESUR NEGATIVE 08/03/2016 0510   Sepsis Labs Invalid input(s): PROCALCITONIN,  WBC,  LACTICIDVEN Microbiology No results found for this or any previous visit (from the past 240 hour(s)).  Time coordinating discharge: 35 minutes  SIGNED:  Kerney Elbe, DO [facility] 08/20/2016, 12:38 PM Pager [phone]  If 7PM-7AM, please contact night-coverage [email or web address] Password [email or web address]

## 2016-08-22 ENCOUNTER — Non-Acute Institutional Stay (SKILLED_NURSING_FACILITY): Payer: Medicare Other | Admitting: Internal Medicine

## 2016-08-22 ENCOUNTER — Encounter: Payer: Self-pay | Admitting: Internal Medicine

## 2016-08-22 DIAGNOSIS — E878 Other disorders of electrolyte and fluid balance, not elsewhere classified: Secondary | ICD-10-CM | POA: Diagnosis not present

## 2016-08-22 DIAGNOSIS — K219 Gastro-esophageal reflux disease without esophagitis: Secondary | ICD-10-CM | POA: Diagnosis not present

## 2016-08-22 DIAGNOSIS — E782 Mixed hyperlipidemia: Secondary | ICD-10-CM

## 2016-08-22 DIAGNOSIS — E038 Other specified hypothyroidism: Secondary | ICD-10-CM | POA: Diagnosis not present

## 2016-08-22 DIAGNOSIS — F250 Schizoaffective disorder, bipolar type: Secondary | ICD-10-CM | POA: Diagnosis not present

## 2016-08-22 DIAGNOSIS — I10 Essential (primary) hypertension: Secondary | ICD-10-CM | POA: Diagnosis not present

## 2016-08-22 DIAGNOSIS — S42355A Nondisplaced comminuted fracture of shaft of humerus, left arm, initial encounter for closed fracture: Secondary | ICD-10-CM | POA: Diagnosis not present

## 2016-08-22 DIAGNOSIS — G40909 Epilepsy, unspecified, not intractable, without status epilepticus: Secondary | ICD-10-CM

## 2016-08-22 LAB — CBC AND DIFFERENTIAL
HCT: 36 % (ref 36–46)
Hemoglobin: 11.7 g/dL — AB (ref 12.0–16.0)
NEUTROS ABS: 7 /uL
PLATELETS: 281 10*3/uL (ref 150–399)
WBC: 9.3 10*3/mL

## 2016-08-22 NOTE — Progress Notes (Signed)
HISTORY AND PHYSICAL   DATE: 08/22/16 Provider: Dr. Gildardo Cranker Location:    [facility] Room Number: 116 Place of Service: SNF (31)   Extended Emergency Contact Information Primary Emergency Contact: Malen Gauze [place] of [place] Mobile Phone: [phone] Relation: Sister Secondary Emergency Contact: Aundra Millet [place] of [place] Phone: [phone] Relation: Other  Advanced Directive information Does Patient Have a Medical Advance Directive?: No  Chief Complaint  Patient presents with  . New Admit To SNF    following hospitalization [facility] 08/18/16 to 08/20/16 left humeral fracture after tripping at home.     HPI:  66 yo female seen today as a new admission into SNF following hospital stay for left humerus fx s/p fall, left elbow fx, leukocytosis, electrolyte derangement, severe hypothyroidism 2/2 noncompliance, CP, depression/anxiety, schizophrenia, COPD and hyperlipidemia. She presented to the ED after fall at home (tripped). Left shoulder xray showed communiated impacted intrarticular fx of left humeral head/surgeical neck with mild posterior inferior subluxation (? intracapsular hematoma). Left elbow xray showed curvilinear lucency through coranoid process of ulna. Ortho consulted and recommended sling. She was given IV pain med --> po med. WBC 14.1K-->8.2K; K 3.1-->3.9; cardiac enzymes neg; telemetry normal; Mg 1.7; Hgb 11.9-->9.5; TSH 62.183; T4 free 0.34; Phos 2.1; albumin 2.5; Ca 6.8 at d/c. She presents to SNF for short term rehab.  Today she reports pain uncontrolled at 9-10/10 on scale. She gets tylenol #3 q8hrs prn moderate pain and prn baclofen. She reports taking oxycodone 21m QID at home prior to hospital admission along with 2 tylenol #3 tabs. She has never been to pain mx.  She does not know when she is scheduled to see ortho again. She is a poor historian due to psych d/o. Hx obtained from  chart.  Hypothyroidism - uncontrolled 2/2 noncompliance with med. She is taking levothyroxine 830m daily. TSH 62.183; T4 free 0.34  COPD - no recent exacerbation  CAD/hx MI/HTN - stable. Takes propanolol BID and statin.   Schizophrenia/depression/anxiety - mood stable on abilify, cogentin, prn lorazepam, effexor XR, restoril  GERD - stable on omeprazole  Hyperlipidemia - stable on statin  Hx sz d/o - currently not on any tx. No sz reported  Past Medical History:  Diagnosis Date  . Arm pain   . Chronic pain   . COPD (chronic obstructive pulmonary disease) ([facility]  . Coronary artery disease   . Depression   . Dystonia   . Epilepsy ([facility]  . High cholesterol   . Humerus fracture   . Hypothyroid   . Leukemia ([facility]  . Myocardial infarct ([facility]  . Schizophrenia ([facility]  . Seizure ([facility]    Past Surgical History:  Procedure Laterality Date  . EXTERNAL EAR SURGERY    . HUMERUS FRACTURE SURGERY    . SHOULDER SURGERY      Patient Care Team: RoLorene DyMD as PCP - General (Internal Medicine)  Social History   Social History  . Marital status: Single    Spouse name: N/A  . Number of children: N/A  . Years of education: N/A   Occupational History  . Not on file.   Social History Main Topics  . Smoking status: Current Every Day Smoker    Packs/day: 2.00    Years: 41.00    Types: Cigarettes  . Smokeless tobacco: Never Used  . Alcohol use No  . Drug use: No  . Sexual activity: No  Other Topics Concern  . Not on file   Social History Narrative   Admitted to [facility] 08/20/16   Single   Smokes daily   Alcohol none   No Advanced Directive.           reports that she has been smoking Cigarettes.  She has a 82.00 pack-year smoking history. She has never used smokeless tobacco. She reports that she does not drink alcohol or use drugs.  History reviewed. No pertinent family history. No family status information on file.    Immunization  History  Administered Date(s) Administered  . Influenza-Unspecified 03/11/2013  . Pneumococcal Polysaccharide-23 01/20/2011  . Tdap 01/20/2011    Allergies  Allergen Reactions  . Amoxicillin Anaphylaxis and Other (See Comments)    Has patient had a PCN reaction causing immediate rash, facial/tongue/throat swelling, SOB or lightheadedness with hypotension: Yes Has patient had a PCN reaction causing severe rash involving mucus membranes or skin necrosis: No Has patient had a PCN reaction that required hospitalization No Has patient had a PCN reaction occurring within the last 10 years: No If all of the above answers are "NO", then may proceed with Cephalosporin use.  Marland Kitchen Penicillins Anaphylaxis and Other (See Comments)    Has patient had a PCN reaction causing immediate rash, facial/tongue/throat swelling, SOB or lightheadedness with hypotension: Yes Has patient had a PCN reaction causing severe rash involving mucus membranes or skin necrosis: No Has patient had a PCN reaction that required hospitalization No Has patient had a PCN reaction occurring within the last 10 years: No If all of the above answers are "NO", then may proceed with Cephalosporin use.   Marland Kitchen Phenytoin Other (See Comments)    Reaction:  CNS disorder   . Dilaudid [Hydromorphone Hcl] Other (See Comments)    Reaction:  Psychosis   . Haldol [Haloperidol] Other (See Comments)    Reaction:  Psychosis   . Morphine And Related Other (See Comments)    Reaction:  Psychosis     Medications: Patient's Medications  New Prescriptions   No medications on file  Previous Medications   ACETAMINOPHEN-CODEINE (TYLENOL #3) 300-30 MG TABLET    Take 1 tablet by mouth every 8 (eight) hours as needed for moderate pain.   ARIPIPRAZOLE (ABILIFY) 20 MG TABLET    Take 1 tablet (20 mg total) by mouth daily.   ATORVASTATIN (LIPITOR) 20 MG TABLET    Take 20 mg by mouth. Take one tablet daily for hyperlipidemia   BACLOFEN (LIORESAL) 10 MG TABLET     Take 10 mg by mouth 3 (three) times daily as needed for muscle spasms.   BENZTROPINE (COGENTIN) 2 MG TABLET    Take 2 mg by mouth 3 (three) times daily.   LEVOTHYROXINE (SYNTHROID, LEVOTHROID) 88 MCG TABLET    Take 88 mcg by mouth daily before breakfast.   LORAZEPAM (ATIVAN) 1 MG TABLET    Take 1 tablet (1 mg total) by mouth 2 (two) times daily as needed for anxiety.   OMEPRAZOLE (PRILOSEC) 40 MG CAPSULE    Take 40 mg by mouth daily.   PROPRANOLOL (INDERAL) 10 MG TABLET    Take 10 mg by mouth 2 (two) times daily.   SIMVASTATIN (ZOCOR) 40 MG TABLET    Take 40 mg by mouth at bedtime.    TEMAZEPAM (RESTORIL) 30 MG CAPSULE    Take 30 mg by mouth at bedtime as needed for sleep.   VENLAFAXINE XR (EFFEXOR-XR) 150 MG 24 HR CAPSULE  Take 300 mg by mouth daily with breakfast.  Modified Medications   No medications on file  Discontinued Medications   FEEDING SUPPLEMENT, ENSURE ENLIVE, (ENSURE ENLIVE) LIQD    Take 237 mLs by mouth 2 (two) times daily between meals.    Review of Systems  Unable to perform ROS: Psychiatric disorder    Vitals:   08/22/16 1447  BP: 111/69  Pulse: 81  Resp: 20  Temp: 97.9 F (36.6 C)  SpO2: 97%  Weight: 183 lb (83 kg)  Height: _0  (1.549 m)   Body mass index is 34.58 kg/m.  Physical Exam  Constitutional: She appears well-developed and well-nourished.  Sitting up in bed, looks uncomfortable in NAD  HENT:  Mouth/Throat: Oropharynx is clear and moist. No oropharyngeal exudate.  Eyes: Pupils are equal, round, and reactive to light. No scleral icterus.  Neck: Neck supple. Carotid bruit is not present. No tracheal deviation present. No thyromegaly present.  Cardiovascular: Normal rate, regular rhythm, normal heart sounds and intact distal pulses.  Exam reveals no gallop and no friction rub.   No murmur heard. No LE edema b/l. no calf TTP.   Pulmonary/Chest: Effort normal and breath sounds normal. No stridor. No respiratory distress. She has no wheezes. She  has no rales.  Abdominal: Soft. Bowel sounds are normal. She exhibits no distension and no mass. There is no hepatomegaly. There is no tenderness. There is no rebound and no guarding.  Musculoskeletal: She exhibits edema, tenderness and deformity.  LUE sling intact with significant swelling of shoulder and left elbow dsg -->hand intact. She has FROM fingers with intact radial and ulnar pulses  Lymphadenopathy:    She has no cervical adenopathy.  Neurological: She is alert.  Skin: Skin is warm and dry. No rash noted.  Left thumb open vesicle with no purulent d/c. Similar area on right fingers. No secondary signs of infection  Psychiatric: She has a normal mood and affect. Her behavior is normal. Thought content normal. Her speech is rapid and/or pressured.     Labs reviewed: Admission on 08/18/2016, Discharged on 08/20/2016  Component Date Value Ref Range Status  . Sodium 08/18/2016 136  135 - 145 mmol/L Final  . Potassium 08/18/2016 4.4  3.5 - 5.1 mmol/L Final  . Chloride 08/18/2016 104  101 - 111 mmol/L Final  . BUN 08/18/2016 28* 6 - 20 mg/dL Final  . Creatinine, Ser 08/18/2016 0.80  0.44 - 1.00 mg/dL Final  . Glucose, Bld 08/18/2016 100* 65 - 99 mg/dL Final  . Calcium, Ion 08/18/2016 0.96* 1.15 - 1.40 mmol/L Final  . TCO2 08/18/2016 31  0 - 100 mmol/L Final  . Hemoglobin 08/18/2016 11.9* 12.0 - 15.0 g/dL Final  . HCT 08/18/2016 35.0* 36.0 - 46.0 % Final  . WBC 08/18/2016 14.1* 4.0 - 10.5 K/uL Final  . RBC 08/18/2016 3.91  3.87 - 5.11 MIL/uL Final  . Hemoglobin 08/18/2016 12.0  12.0 - 15.0 g/dL Final  . HCT 08/18/2016 37.3  36.0 - 46.0 % Final  . MCV 08/18/2016 95.4  78.0 - 100.0 fL Final  . MCH 08/18/2016 30.7  26.0 - 34.0 pg Final  . MCHC 08/18/2016 32.2  30.0 - 36.0 g/dL Final  . RDW 08/18/2016 14.7  11.5 - 15.5 % Final  . Platelets 08/18/2016 279  150 - 400 K/uL Final  . Neutrophils Relative % 08/18/2016 78  % Final  . Neutro Abs 08/18/2016 11.0* 1.7 - 7.7 K/uL Final  .  Lymphocytes Relative 08/18/2016 15  %  Final  . Lymphs Abs 08/18/2016 2.1  0.7 - 4.0 K/uL Final  . Monocytes Relative 08/18/2016 5  % Final  . Monocytes Absolute 08/18/2016 0.7  0.1 - 1.0 K/uL Final  . Eosinophils Relative 08/18/2016 2  % Final  . Eosinophils Absolute 08/18/2016 0.2  0.0 - 0.7 K/uL Final  . Basophils Relative 08/18/2016 0  % Final  . Basophils Absolute 08/18/2016 0.1  0.0 - 0.1 K/uL Final  . WBC 08/18/2016 14.1* 4.0 - 10.5 K/uL Final  . RBC 08/18/2016 3.91  3.87 - 5.11 MIL/uL Final  . Hemoglobin 08/18/2016 11.9* 12.0 - 15.0 g/dL Final  . HCT 08/18/2016 37.3  36.0 - 46.0 % Final  . MCV 08/18/2016 95.4  78.0 - 100.0 fL Final  . MCH 08/18/2016 30.4  26.0 - 34.0 pg Final  . MCHC 08/18/2016 31.9  30.0 - 36.0 g/dL Final  . RDW 08/18/2016 14.7  11.5 - 15.5 % Final  . Platelets 08/18/2016 300  150 - 400 K/uL Final  . Creatinine, Ser 08/18/2016 0.90  0.44 - 1.00 mg/dL Final  . GFR calc non Af Amer 08/18/2016 >60  >60 mL/min Final  . GFR calc Af Amer 08/18/2016 >60  >60 mL/min Final   Comment: (NOTE) The eGFR has been calculated using the CKD EPI equation. This calculation has not been validated in all clinical situations. eGFR's persistently <60 mL/min signify possible Chronic Kidney Disease.   Marland Kitchen TSH 08/18/2016 62.183* 0.350 - 4.500 uIU/mL Final   Performed by a 3rd Generation assay with a functional sensitivity of <=0.01 uIU/mL.  . Troponin I 08/18/2016 <0.03  <0.03 ng/mL Final  . Troponin I 08/18/2016 <0.03  <0.03 ng/mL Final  . Troponin I 08/19/2016 <0.03  <0.03 ng/mL Final  . Sodium 08/19/2016 140  135 - 145 mmol/L Final  . Potassium 08/19/2016 3.1* 3.5 - 5.1 mmol/L Final   Comment: DELTA CHECK NOTED REPEATED TO VERIFY   . Chloride 08/19/2016 111  101 - 111 mmol/L Final  . CO2 08/19/2016 23  22 - 32 mmol/L Final  . Glucose, Bld 08/19/2016 80  65 - 99 mg/dL Final  . BUN 08/19/2016 20  6 - 20 mg/dL Final  . Creatinine, Ser 08/19/2016 0.62  0.44 - 1.00 mg/dL Final  .  Calcium 08/19/2016 6.7* 8.9 - 10.3 mg/dL Final  . GFR calc non Af Amer 08/19/2016 >60  >60 mL/min Final  . GFR calc Af Amer 08/19/2016 >60  >60 mL/min Final   Comment: (NOTE) The eGFR has been calculated using the CKD EPI equation. This calculation has not been validated in all clinical situations. eGFR's persistently <60 mL/min signify possible Chronic Kidney Disease.   . Anion gap 08/19/2016 6  5 - 15 Final  . WBC 08/19/2016 9.3  4.0 - 10.5 K/uL Final  . RBC 08/19/2016 3.14* 3.87 - 5.11 MIL/uL Final  . Hemoglobin 08/19/2016 9.4* 12.0 - 15.0 g/dL Final   DELTA CHECK NOTED  . HCT 08/19/2016 30.4* 36.0 - 46.0 % Final  . MCV 08/19/2016 96.8  78.0 - 100.0 fL Final  . MCH 08/19/2016 29.9  26.0 - 34.0 pg Final  . MCHC 08/19/2016 30.9  30.0 - 36.0 g/dL Final  . RDW 08/19/2016 14.7  11.5 - 15.5 % Final  . Platelets 08/19/2016 203  150 - 400 K/uL Final  . Free T4 08/19/2016 0.34* 0.61 - 1.12 ng/dL Final   Comment: (NOTE) Biotin ingestion may interfere with free T4 tests. If the results are inconsistent with the TSH  level, previous test results, or the clinical presentation, then consider biotin interference. If needed, order repeat testing after stopping biotin. Performed at [facility], [place] [address], [place], [place] [address]   . Magnesium 08/19/2016 1.7  1.7 - 2.4 mg/dL Final  . Phosphorus 08/19/2016 3.2  2.5 - 4.6 mg/dL Final  . WBC 08/20/2016 8.2  4.0 - 10.5 K/uL Final  . RBC 08/20/2016 3.09* 3.87 - 5.11 MIL/uL Final  . Hemoglobin 08/20/2016 9.5* 12.0 - 15.0 g/dL Final  . HCT 08/20/2016 30.3* 36.0 - 46.0 % Final  . MCV 08/20/2016 98.1  78.0 - 100.0 fL Final  . MCH 08/20/2016 30.7  26.0 - 34.0 pg Final  . MCHC 08/20/2016 31.4  30.0 - 36.0 g/dL Final  . RDW 08/20/2016 15.0  11.5 - 15.5 % Final  . Platelets 08/20/2016 225  150 - 400 K/uL Final  . Neutrophils Relative % 08/20/2016 67  % Final  . Neutro Abs 08/20/2016 5.6  1.7 - 7.7 K/uL Final  . Lymphocytes Relative  08/20/2016 23  % Final  . Lymphs Abs 08/20/2016 1.9  0.7 - 4.0 K/uL Final  . Monocytes Relative 08/20/2016 5  % Final  . Monocytes Absolute 08/20/2016 0.4  0.1 - 1.0 K/uL Final  . Eosinophils Relative 08/20/2016 4  % Final  . Eosinophils Absolute 08/20/2016 0.3  0.0 - 0.7 K/uL Final  . Basophils Relative 08/20/2016 1  % Final  . Basophils Absolute 08/20/2016 0.0  0.0 - 0.1 K/uL Final  . Sodium 08/20/2016 139  135 - 145 mmol/L Final  . Potassium 08/20/2016 3.9  3.5 - 5.1 mmol/L Final   DELTA CHECK NOTED  . Chloride 08/20/2016 113* 101 - 111 mmol/L Final  . CO2 08/20/2016 24  22 - 32 mmol/L Final  . Glucose, Bld 08/20/2016 88  65 - 99 mg/dL Final  . BUN 08/20/2016 18  6 - 20 mg/dL Final  . Creatinine, Ser 08/20/2016 0.54  0.44 - 1.00 mg/dL Final  . Calcium 08/20/2016 6.8* 8.9 - 10.3 mg/dL Final  . Total Protein 08/20/2016 5.1* 6.5 - 8.1 g/dL Final  . Albumin 08/20/2016 2.5* 3.5 - 5.0 g/dL Final  . AST 08/20/2016 15  15 - 41 U/L Final  . ALT 08/20/2016 18  14 - 54 U/L Final  . Alkaline Phosphatase 08/20/2016 57  38 - 126 U/L Final  . Total Bilirubin 08/20/2016 0.5  0.3 - 1.2 mg/dL Final  . GFR calc non Af Amer 08/20/2016 >60  >60 mL/min Final  . GFR calc Af Amer 08/20/2016 >60  >60 mL/min Final   Comment: (NOTE) The eGFR has been calculated using the CKD EPI equation. This calculation has not been validated in all clinical situations. eGFR's persistently <60 mL/min signify possible Chronic Kidney Disease.   . Magnesium 08/20/2016 1.8  1.7 - 2.4 mg/dL Final  . Phosphorus 08/20/2016 2.1* 2.5 - 4.6 mg/dL Final  . TSH 08/20/2016 46.443* 0.350 - 4.500 uIU/mL Final   Performed by a 3rd Generation assay with a functional sensitivity of <=0.01 uIU/mL.  . Free T4 08/20/2016 0.33* 0.61 - 1.12 ng/dL Final   Comment: (NOTE) Biotin ingestion may interfere with free T4 tests. If the results are inconsistent with the TSH level, previous test results, or the clinical presentation, then consider  biotin interference. If needed, order repeat testing after stopping biotin. Performed at [facility], [place] [address], [place], [place] [address]   Admission on 08/03/2016, Discharged on 08/06/2016  Component Date  Value Ref Range Status  . WBC 08/03/2016 20.2* 4.0 - 10.5 K/uL Final  . RBC 08/03/2016 5.07  3.87 - 5.11 MIL/uL Final  . Hemoglobin 08/03/2016 15.3* 12.0 - 15.0 g/dL Final  . HCT 08/03/2016 44.0  36.0 - 46.0 % Final  . MCV 08/03/2016 86.8  78.0 - 100.0 fL Final  . MCH 08/03/2016 30.2  26.0 - 34.0 pg Final  . MCHC 08/03/2016 34.8  30.0 - 36.0 g/dL Final  . RDW 08/03/2016 14.2  11.5 - 15.5 % Final  . Platelets 08/03/2016 459* 150 - 400 K/uL Final  . Neutrophils Relative % 08/03/2016 87  % Final  . Neutro Abs 08/03/2016 17.5* 1.7 - 7.7 K/uL Final  . Lymphocytes Relative 08/03/2016 9  % Final  . Lymphs Abs 08/03/2016 1.8  0.7 - 4.0 K/uL Final  . Monocytes Relative 08/03/2016 4  % Final  . Monocytes Absolute 08/03/2016 0.8  0.1 - 1.0 K/uL Final  . Eosinophils Relative 08/03/2016 0  % Final  . Eosinophils Absolute 08/03/2016 0.1  0.0 - 0.7 K/uL Final  . Basophils Relative 08/03/2016 0  % Final  . Basophils Absolute 08/03/2016 0.0  0.0 - 0.1 K/uL Final  . Opiates 08/03/2016 NONE DETECTED  NONE DETECTED Final  . Cocaine 08/03/2016 NONE DETECTED  NONE DETECTED Final  . Benzodiazepines 08/03/2016 POSITIVE* NONE DETECTED Final  . Amphetamines 08/03/2016 NONE DETECTED  NONE DETECTED Final  . Tetrahydrocannabinol 08/03/2016 NONE DETECTED  NONE DETECTED Final  . Barbiturates 08/03/2016 NONE DETECTED  NONE DETECTED Final   Comment:        DRUG SCREEN FOR MEDICAL PURPOSES ONLY.  IF CONFIRMATION IS NEEDED FOR ANY PURPOSE, NOTIFY LAB WITHIN 5 DAYS.        LOWEST DETECTABLE LIMITS FOR URINE DRUG SCREEN Drug Class       Cutoff (ng/mL) Amphetamine      1000 Barbiturate      200 Benzodiazepine   540 Tricyclics       086 Opiates          300 Cocaine          300 THC               50   . Color, Urine 08/03/2016 YELLOW  YELLOW Final  . APPearance 08/03/2016 HAZY* CLEAR Final  . Specific Gravity, Urine 08/03/2016 1.025  1.005 - 1.030 Final  . pH 08/03/2016 5.0  5.0 - 8.0 Final  . Glucose, UA 08/03/2016 NEGATIVE  NEGATIVE mg/dL Final  . Hgb urine dipstick 08/03/2016 NEGATIVE  NEGATIVE Final  . Bilirubin Urine 08/03/2016 NEGATIVE  NEGATIVE Final  . Ketones, ur 08/03/2016 80* NEGATIVE mg/dL Final  . Protein, ur 08/03/2016 30* NEGATIVE mg/dL Final  . Nitrite 08/03/2016 NEGATIVE  NEGATIVE Final  . Leukocytes, UA 08/03/2016 NEGATIVE  NEGATIVE Final  . RBC / HPF 08/03/2016 0-5  0 - 5 RBC/hpf Final  . WBC, UA 08/03/2016 6-30  0 - 5 WBC/hpf Final  . Bacteria, UA 08/03/2016 MANY* NONE SEEN Final  . Squamous Epithelial / LPF 08/03/2016 0-5* NONE SEEN Final  . Mucous 08/03/2016 PRESENT   Final  . Hyaline Casts, UA 08/03/2016 PRESENT   Final  . Acetaminophen (Tylenol), Serum 08/03/2016 <10* 10 - 30 ug/mL Final   Comment:        THERAPEUTIC CONCENTRATIONS VARY SIGNIFICANTLY. A RANGE OF 10-30 ug/mL MAY BE AN EFFECTIVE CONCENTRATION FOR MANY PATIENTS. HOWEVER, SOME ARE BEST TREATED AT CONCENTRATIONS OUTSIDE THIS RANGE. ACETAMINOPHEN CONCENTRATIONS >150 ug/mL AT  4 HOURS AFTER INGESTION AND >50 ug/mL AT 12 HOURS AFTER INGESTION ARE OFTEN ASSOCIATED WITH TOXIC REACTIONS.   Marland Kitchen Alcohol, Ethyl (B) 08/03/2016 <5  <5 mg/dL Final   Comment:        LOWEST DETECTABLE LIMIT FOR SERUM ALCOHOL IS 5 mg/dL FOR MEDICAL PURPOSES ONLY   . Sodium 08/03/2016 137  135 - 145 mmol/L Final  . Potassium 08/03/2016 3.0* 3.5 - 5.1 mmol/L Final  . Chloride 08/03/2016 95* 101 - 111 mmol/L Final  . CO2 08/03/2016 23  22 - 32 mmol/L Final  . Glucose, Bld 08/03/2016 84  65 - 99 mg/dL Final  . BUN 08/03/2016 14  6 - 20 mg/dL Final  . Creatinine, Ser 08/03/2016 0.82  0.44 - 1.00 mg/dL Final  . Calcium 08/03/2016 9.0  8.9 - 10.3 mg/dL Final  . Total Protein 08/03/2016 7.9  6.5 - 8.1 g/dL Final    . Albumin 08/03/2016 4.3  3.5 - 5.0 g/dL Final  . AST 08/03/2016 26  15 - 41 U/L Final  . ALT 08/03/2016 25  14 - 54 U/L Final  . Alkaline Phosphatase 08/03/2016 88  38 - 126 U/L Final  . Total Bilirubin 08/03/2016 1.2  0.3 - 1.2 mg/dL Final  . GFR calc non Af Amer 08/03/2016 >60  >60 mL/min Final  . GFR calc Af Amer 08/03/2016 >60  >60 mL/min Final   Comment: (NOTE) The eGFR has been calculated using the CKD EPI equation. This calculation has not been validated in all clinical situations. eGFR's persistently <60 mL/min signify possible Chronic Kidney Disease.   . Anion gap 08/03/2016 19* 5 - 15 Final  . Salicylate Lvl 69/45/0388 <7.0  2.8 - 30.0 mg/dL Final  . Specimen Description 08/03/2016 URINE, RANDOM   Final  . Special Requests 08/03/2016 NONE   Final  . Culture 08/03/2016    Final                   Value:NO GROWTH Performed at [facility], [place] [address] [place], [place] [address]   . Report Status 08/03/2016 08/04/2016 FINAL   Final  . Sodium 08/03/2016 135  135 - 145 mmol/L Final  . Potassium 08/03/2016 3.1* 3.5 - 5.1 mmol/L Final  . Chloride 08/03/2016 97* 101 - 111 mmol/L Final  . CO2 08/03/2016 22  22 - 32 mmol/L Final  . Glucose, Bld 08/03/2016 101* 65 - 99 mg/dL Final  . BUN 08/03/2016 14  6 - 20 mg/dL Final  . Creatinine, Ser 08/03/2016 0.85  0.44 - 1.00 mg/dL Final  . Calcium 08/03/2016 8.9  8.9 - 10.3 mg/dL Final  . Total Protein 08/03/2016 7.7  6.5 - 8.1 g/dL Final  . Albumin 08/03/2016 4.2  3.5 - 5.0 g/dL Final  . AST 08/03/2016 23  15 - 41 U/L Final  . ALT 08/03/2016 22  14 - 54 U/L Final  . Alkaline Phosphatase 08/03/2016 94  38 - 126 U/L Final  . Total Bilirubin 08/03/2016 1.1  0.3 - 1.2 mg/dL Final  . GFR calc non Af Amer 08/03/2016 >60  >60 mL/min Final  . GFR calc Af Amer 08/03/2016 >60  >60 mL/min Final   Comment: (NOTE) The eGFR has been calculated using the CKD EPI equation. This calculation has not been validated in all clinical  situations. eGFR's persistently <60 mL/min signify possible Chronic Kidney Disease.   . Anion gap 08/03/2016 16* 5 - 15 Final  . Magnesium 08/03/2016 2.1  1.7 - 2.4  mg/dL Final  . Phosphorus 08/03/2016 2.8  2.5 - 4.6 mg/dL Final  . WBC 08/03/2016 17.4* 4.0 - 10.5 K/uL Final  . RBC 08/03/2016 5.16* 3.87 - 5.11 MIL/uL Final  . Hemoglobin 08/03/2016 16.1* 12.0 - 15.0 g/dL Final  . HCT 08/03/2016 46.8* 36.0 - 46.0 % Final  . MCV 08/03/2016 90.7  78.0 - 100.0 fL Final  . MCH 08/03/2016 31.2  26.0 - 34.0 pg Final  . MCHC 08/03/2016 34.4  30.0 - 36.0 g/dL Final  . RDW 08/03/2016 13.9  11.5 - 15.5 % Final  . Platelets 08/03/2016 451* 150 - 400 K/uL Final  . Neutrophils Relative % 08/03/2016 82  % Final  . Lymphocytes Relative 08/03/2016 14  % Final  . Monocytes Relative 08/03/2016 3  % Final  . Eosinophils Relative 08/03/2016 1  % Final  . Basophils Relative 08/03/2016 0  % Final  . Neutro Abs 08/03/2016 14.3* 1.7 - 7.7 K/uL Final  . Lymphs Abs 08/03/2016 2.4  0.7 - 4.0 K/uL Final  . Monocytes Absolute 08/03/2016 0.5  0.1 - 1.0 K/uL Final  . Eosinophils Absolute 08/03/2016 0.2  0.0 - 0.7 K/uL Final  . Basophils Absolute 08/03/2016 0.0  0.0 - 0.1 K/uL Final  . Smear Review 08/03/2016 MORPHOLOGY UNREMARKABLE   Final  . WBC 08/04/2016 12.9* 4.0 - 10.5 K/uL Final  . RBC 08/04/2016 4.89  3.87 - 5.11 MIL/uL Final  . Hemoglobin 08/04/2016 15.2* 12.0 - 15.0 g/dL Final  . HCT 08/04/2016 44.4  36.0 - 46.0 % Final  . MCV 08/04/2016 90.8  78.0 - 100.0 fL Final  . MCH 08/04/2016 31.1  26.0 - 34.0 pg Final  . MCHC 08/04/2016 34.2  30.0 - 36.0 g/dL Final  . RDW 08/04/2016 14.2  11.5 - 15.5 % Final  . Platelets 08/04/2016 433* 150 - 400 K/uL Final  . TSH 08/04/2016 42.352* 0.350 - 4.500 uIU/mL Final   Performed by a 3rd Generation assay with a functional sensitivity of <=0.01 uIU/mL.  Marland Kitchen Glucose-Capillary 08/04/2016 102* 65 - 99 mg/dL Final  . Sodium 08/04/2016 136  135 - 145 mmol/L Final  . Potassium  08/04/2016 3.2* 3.5 - 5.1 mmol/L Final  . Chloride 08/04/2016 101  101 - 111 mmol/L Final  . CO2 08/04/2016 24  22 - 32 mmol/L Final  . Glucose, Bld 08/04/2016 89  65 - 99 mg/dL Final  . BUN 08/04/2016 15  6 - 20 mg/dL Final  . Creatinine, Ser 08/04/2016 0.85  0.44 - 1.00 mg/dL Final  . Calcium 08/04/2016 8.6* 8.9 - 10.3 mg/dL Final  . GFR calc non Af Amer 08/04/2016 >60  >60 mL/min Final  . GFR calc Af Amer 08/04/2016 >60  >60 mL/min Final   Comment: (NOTE) The eGFR has been calculated using the CKD EPI equation. This calculation has not been validated in all clinical situations. eGFR's persistently <60 mL/min signify possible Chronic Kidney Disease.   . Anion gap 08/04/2016 11  5 - 15 Final  . Sodium 08/05/2016 141  135 - 145 mmol/L Final  . Potassium 08/05/2016 2.9* 3.5 - 5.1 mmol/L Final  . Chloride 08/05/2016 106  101 - 111 mmol/L Final  . CO2 08/05/2016 25  22 - 32 mmol/L Final  . Glucose, Bld 08/05/2016 101* 65 - 99 mg/dL Final  . BUN 08/05/2016 13  6 - 20 mg/dL Final  . Creatinine, Ser 08/05/2016 0.84  0.44 - 1.00 mg/dL Final  . Calcium 08/05/2016 8.4* 8.9 - 10.3 mg/dL Final  .  GFR calc non Af Amer 08/05/2016 >60  >60 mL/min Final  . GFR calc Af Amer 08/05/2016 >60  >60 mL/min Final   Comment: (NOTE) The eGFR has been calculated using the CKD EPI equation. This calculation has not been validated in all clinical situations. eGFR's persistently <60 mL/min signify possible Chronic Kidney Disease.   . Anion gap 08/05/2016 10  5 - 15 Final  . WBC 08/05/2016 13.3* 4.0 - 10.5 K/uL Final  . RBC 08/05/2016 4.76  3.87 - 5.11 MIL/uL Final  . Hemoglobin 08/05/2016 14.8  12.0 - 15.0 g/dL Final  . HCT 08/05/2016 43.5  36.0 - 46.0 % Final  . MCV 08/05/2016 91.4  78.0 - 100.0 fL Final  . MCH 08/05/2016 31.1  26.0 - 34.0 pg Final  . MCHC 08/05/2016 34.0  30.0 - 36.0 g/dL Final  . RDW 08/05/2016 14.2  11.5 - 15.5 % Final  . Platelets 08/05/2016 359  150 - 400 K/uL Final  . Neutrophils  Relative % 08/05/2016 68  % Final  . Neutro Abs 08/05/2016 9.0* 1.7 - 7.7 K/uL Final  . Lymphocytes Relative 08/05/2016 21  % Final  . Lymphs Abs 08/05/2016 2.8  0.7 - 4.0 K/uL Final  . Monocytes Relative 08/05/2016 7  % Final  . Monocytes Absolute 08/05/2016 0.9  0.1 - 1.0 K/uL Final  . Eosinophils Relative 08/05/2016 3  % Final  . Eosinophils Absolute 08/05/2016 0.4  0.0 - 0.7 K/uL Final  . Basophils Relative 08/05/2016 1  % Final  . Basophils Absolute 08/05/2016 0.1  0.0 - 0.1 K/uL Final  . Glucose-Capillary 08/05/2016 101* 65 - 99 mg/dL Final  . Sodium 08/06/2016 138  135 - 145 mmol/L Final  . Potassium 08/06/2016 3.8  3.5 - 5.1 mmol/L Final   DELTA CHECK NOTED  . Chloride 08/06/2016 104  101 - 111 mmol/L Final  . CO2 08/06/2016 26  22 - 32 mmol/L Final  . Glucose, Bld 08/06/2016 91  65 - 99 mg/dL Final  . BUN 08/06/2016 13  6 - 20 mg/dL Final  . Creatinine, Ser 08/06/2016 0.77  0.44 - 1.00 mg/dL Final  . Calcium 08/06/2016 8.4* 8.9 - 10.3 mg/dL Final  . GFR calc non Af Amer 08/06/2016 >60  >60 mL/min Final  . GFR calc Af Amer 08/06/2016 >60  >60 mL/min Final   Comment: (NOTE) The eGFR has been calculated using the CKD EPI equation. This calculation has not been validated in all clinical situations. eGFR's persistently <60 mL/min signify possible Chronic Kidney Disease.   . Anion gap 08/06/2016 8  5 - 15 Final  . WBC 08/06/2016 13.1* 4.0 - 10.5 K/uL Final  . RBC 08/06/2016 4.41  3.87 - 5.11 MIL/uL Final  . Hemoglobin 08/06/2016 13.4  12.0 - 15.0 g/dL Final  . HCT 08/06/2016 41.1  36.0 - 46.0 % Final  . MCV 08/06/2016 93.2  78.0 - 100.0 fL Final  . MCH 08/06/2016 30.4  26.0 - 34.0 pg Final  . MCHC 08/06/2016 32.6  30.0 - 36.0 g/dL Final  . RDW 08/06/2016 14.4  11.5 - 15.5 % Final  . Platelets 08/06/2016 345  150 - 400 K/uL Final  . Neutrophils Relative % 08/06/2016 67  % Final  . Neutro Abs 08/06/2016 8.9* 1.7 - 7.7 K/uL Final  . Lymphocytes Relative 08/06/2016 21  % Final    . Lymphs Abs 08/06/2016 2.7  0.7 - 4.0 K/uL Final  . Monocytes Relative 08/06/2016 6  % Final  . Monocytes  Absolute 08/06/2016 0.8  0.1 - 1.0 K/uL Final  . Eosinophils Relative 08/06/2016 5  % Final  . Eosinophils Absolute 08/06/2016 0.6  0.0 - 0.7 K/uL Final  . Basophils Relative 08/06/2016 1  % Final  . Basophils Absolute 08/06/2016 0.1  0.0 - 0.1 K/uL Final  Admission on 07/28/2016, Discharged on 07/29/2016  Component Date Value Ref Range Status  . WBC 07/28/2016 24.3* 4.0 - 10.5 K/uL Final  . RBC 07/28/2016 4.41  3.87 - 5.11 MIL/uL Final  . Hemoglobin 07/28/2016 13.5  12.0 - 15.0 g/dL Final  . HCT 07/28/2016 41.0  36.0 - 46.0 % Final  . MCV 07/28/2016 93.0  78.0 - 100.0 fL Final  . MCH 07/28/2016 30.6  26.0 - 34.0 pg Final  . MCHC 07/28/2016 32.9  30.0 - 36.0 g/dL Final  . RDW 07/28/2016 13.9  11.5 - 15.5 % Final  . Platelets 07/28/2016 343  150 - 400 K/uL Final  . Neutrophils Relative % 07/28/2016 85  % Final  . Neutro Abs 07/28/2016 20.8* 1.7 - 7.7 K/uL Final  . Lymphocytes Relative 07/28/2016 9  % Final  . Lymphs Abs 07/28/2016 2.2  0.7 - 4.0 K/uL Final  . Monocytes Relative 07/28/2016 6  % Final  . Monocytes Absolute 07/28/2016 1.4* 0.1 - 1.0 K/uL Final  . Eosinophils Relative 07/28/2016 0  % Final  . Eosinophils Absolute 07/28/2016 0.0  0.0 - 0.7 K/uL Final  . Basophils Relative 07/28/2016 0  % Final  . Basophils Absolute 07/28/2016 0.1  0.0 - 0.1 K/uL Final  . Sodium 07/28/2016 138  135 - 145 mmol/L Final  . Potassium 07/28/2016 2.8* 3.5 - 5.1 mmol/L Final  . Chloride 07/28/2016 103  101 - 111 mmol/L Final  . CO2 07/28/2016 22  22 - 32 mmol/L Final  . Glucose, Bld 07/28/2016 91  65 - 99 mg/dL Final  . BUN 07/28/2016 15  6 - 20 mg/dL Final  . Creatinine, Ser 07/28/2016 0.84  0.44 - 1.00 mg/dL Final  . Calcium 07/28/2016 8.9  8.9 - 10.3 mg/dL Final  . Total Protein 07/28/2016 8.1  6.5 - 8.1 g/dL Final  . Albumin 07/28/2016 4.4  3.5 - 5.0 g/dL Final  . AST 07/28/2016  72* 15 - 41 U/L Final  . ALT 07/28/2016 37  14 - 54 U/L Final  . Alkaline Phosphatase 07/28/2016 88  38 - 126 U/L Final  . Total Bilirubin 07/28/2016 0.9  0.3 - 1.2 mg/dL Final  . GFR calc non Af Amer 07/28/2016 >60  >60 mL/min Final  . GFR calc Af Amer 07/28/2016 >60  >60 mL/min Final   Comment: (NOTE) The eGFR has been calculated using the CKD EPI equation. This calculation has not been validated in all clinical situations. eGFR's persistently <60 mL/min signify possible Chronic Kidney Disease.   . Anion gap 07/28/2016 13  5 - 15 Final  . Color, Urine 07/28/2016 YELLOW  YELLOW Final  . APPearance 07/28/2016 HAZY* CLEAR Final  . Specific Gravity, Urine 07/28/2016 1.032* 1.005 - 1.030 Final  . pH 07/28/2016 5.0  5.0 - 8.0 Final  . Glucose, UA 07/28/2016 NEGATIVE  NEGATIVE mg/dL Final  . Hgb urine dipstick 07/28/2016 NEGATIVE  NEGATIVE Final  . Bilirubin Urine 07/28/2016 NEGATIVE  NEGATIVE Final  . Ketones, ur 07/28/2016 20* NEGATIVE mg/dL Final  . Protein, ur 07/28/2016 30* NEGATIVE mg/dL Final  . Nitrite 07/28/2016 NEGATIVE  NEGATIVE Final  . Leukocytes, UA 07/28/2016 NEGATIVE  NEGATIVE Final  . RBC /  HPF 07/28/2016 0-5  0 - 5 RBC/hpf Final  . WBC, UA 07/28/2016 0-5  0 - 5 WBC/hpf Final  . Bacteria, UA 07/28/2016 NONE SEEN  NONE SEEN Final  . Squamous Epithelial / LPF 07/28/2016 0-5* NONE SEEN Final  . Mucous 07/28/2016 PRESENT   Final  . Hyaline Casts, UA 07/28/2016 PRESENT   Final  . Opiates 07/28/2016 POSITIVE* NONE DETECTED Final  . Cocaine 07/28/2016 NONE DETECTED  NONE DETECTED Final  . Benzodiazepines 07/28/2016 POSITIVE* NONE DETECTED Final  . Amphetamines 07/28/2016 NONE DETECTED  NONE DETECTED Final  . Tetrahydrocannabinol 07/28/2016 NONE DETECTED  NONE DETECTED Final  . Barbiturates 07/28/2016 NONE DETECTED  NONE DETECTED Final   Comment:        DRUG SCREEN FOR MEDICAL PURPOSES ONLY.  IF CONFIRMATION IS NEEDED FOR ANY PURPOSE, NOTIFY LAB WITHIN 5 DAYS.         LOWEST DETECTABLE LIMITS FOR URINE DRUG SCREEN Drug Class       Cutoff (ng/mL) Amphetamine      1000 Barbiturate      200 Benzodiazepine   937 Tricyclics       169 Opiates          300 Cocaine          300 THC              50   . Acetaminophen (Tylenol), Serum 07/28/2016 <10* 10 - 30 ug/mL Final   Comment:        THERAPEUTIC CONCENTRATIONS VARY SIGNIFICANTLY. A RANGE OF 10-30 ug/mL MAY BE AN EFFECTIVE CONCENTRATION FOR MANY PATIENTS. HOWEVER, SOME ARE BEST TREATED AT CONCENTRATIONS OUTSIDE THIS RANGE. ACETAMINOPHEN CONCENTRATIONS >150 ug/mL AT 4 HOURS AFTER INGESTION AND >50 ug/mL AT 12 HOURS AFTER INGESTION ARE OFTEN ASSOCIATED WITH TOXIC REACTIONS.   . Salicylate Lvl 67/89/3810 <7.0  2.8 - 30.0 mg/dL Final  . Alcohol, Ethyl (B) 07/28/2016 <5  <5 mg/dL Final   Comment:        LOWEST DETECTABLE LIMIT FOR SERUM ALCOHOL IS 5 mg/dL FOR MEDICAL PURPOSES ONLY   . Magnesium 07/28/2016 2.4  1.7 - 2.4 mg/dL Final  . WBC 07/28/2016 18.4* 4.0 - 10.5 K/uL Final  . RBC 07/28/2016 4.25  3.87 - 5.11 MIL/uL Final  . Hemoglobin 07/28/2016 13.1  12.0 - 15.0 g/dL Final  . HCT 07/28/2016 39.5  36.0 - 46.0 % Final  . MCV 07/28/2016 92.9  78.0 - 100.0 fL Final  . MCH 07/28/2016 30.8  26.0 - 34.0 pg Final  . MCHC 07/28/2016 33.2  30.0 - 36.0 g/dL Final  . RDW 07/28/2016 14.0  11.5 - 15.5 % Final  . Platelets 07/28/2016 333  150 - 400 K/uL Final  . Neutrophils Relative % 07/28/2016 76  % Final  . Neutro Abs 07/28/2016 14.1* 1.7 - 7.7 K/uL Final  . Lymphocytes Relative 07/28/2016 18  % Final  . Lymphs Abs 07/28/2016 3.3  0.7 - 4.0 K/uL Final  . Monocytes Relative 07/28/2016 5  % Final  . Monocytes Absolute 07/28/2016 0.8  0.1 - 1.0 K/uL Final  . Eosinophils Relative 07/28/2016 1  % Final  . Eosinophils Absolute 07/28/2016 0.2  0.0 - 0.7 K/uL Final  . Basophils Relative 07/28/2016 0  % Final  . Basophils Absolute 07/28/2016 0.1  0.0 - 0.1 K/uL Final  . WBC 07/29/2016 13.5* 4.0 - 10.5  K/uL Final  . RBC 07/29/2016 4.14  3.87 - 5.11 MIL/uL Final  . Hemoglobin 07/29/2016 12.7  12.0 - 15.0  g/dL Final  . HCT 07/29/2016 38.6  36.0 - 46.0 % Final  . MCV 07/29/2016 93.2  78.0 - 100.0 fL Final  . MCH 07/29/2016 30.7  26.0 - 34.0 pg Final  . MCHC 07/29/2016 32.9  30.0 - 36.0 g/dL Final  . RDW 07/29/2016 14.1  11.5 - 15.5 % Final  . Platelets 07/29/2016 323  150 - 400 K/uL Final  . Neutrophils Relative % 07/29/2016 78  % Final  . Neutro Abs 07/29/2016 10.4* 1.7 - 7.7 K/uL Final  . Lymphocytes Relative 07/29/2016 16  % Final  . Lymphs Abs 07/29/2016 2.2  0.7 - 4.0 K/uL Final  . Monocytes Relative 07/29/2016 5  % Final  . Monocytes Absolute 07/29/2016 0.6  0.1 - 1.0 K/uL Final  . Eosinophils Relative 07/29/2016 1  % Final  . Eosinophils Absolute 07/29/2016 0.2  0.0 - 0.7 K/uL Final  . Basophils Relative 07/29/2016 0  % Final  . Basophils Absolute 07/29/2016 0.0  0.0 - 0.1 K/uL Final  . Sodium 07/29/2016 137  135 - 145 mmol/L Final  . Potassium 07/29/2016 3.5  3.5 - 5.1 mmol/L Final   Comment: REPEATED TO VERIFY DELTA CHECK NOTED NO VISIBLE HEMOLYSIS   . Chloride 07/29/2016 105  101 - 111 mmol/L Final  . CO2 07/29/2016 23  22 - 32 mmol/L Final  . Glucose, Bld 07/29/2016 79  65 - 99 mg/dL Final  . BUN 07/29/2016 13  6 - 20 mg/dL Final  . Creatinine, Ser 07/29/2016 0.83  0.44 - 1.00 mg/dL Final  . Calcium 07/29/2016 8.4* 8.9 - 10.3 mg/dL Final  . GFR calc non Af Amer 07/29/2016 >60  >60 mL/min Final  . GFR calc Af Amer 07/29/2016 >60  >60 mL/min Final   Comment: (NOTE) The eGFR has been calculated using the CKD EPI equation. This calculation has not been validated in all clinical situations. eGFR's persistently <60 mL/min signify possible Chronic Kidney Disease.   . Anion gap 07/29/2016 9  5 - 15 Final  . WBC 07/29/2016 13.2* 4.0 - 10.5 K/uL Final  . RBC 07/29/2016 4.22  3.87 - 5.11 MIL/uL Final  . Hemoglobin 07/29/2016 12.9  12.0 - 15.0 g/dL Final  . HCT 07/29/2016  39.2  36.0 - 46.0 % Final  . MCV 07/29/2016 92.9  78.0 - 100.0 fL Final  . MCH 07/29/2016 30.6  26.0 - 34.0 pg Final  . MCHC 07/29/2016 32.9  30.0 - 36.0 g/dL Final  . RDW 07/29/2016 14.0  11.5 - 15.5 % Final  . Platelets 07/29/2016 327  150 - 400 K/uL Final  . Neutrophils Relative % 07/29/2016 75  % Final  . Neutro Abs 07/29/2016 10.0* 1.7 - 7.7 K/uL Final  . Lymphocytes Relative 07/29/2016 17  % Final  . Lymphs Abs 07/29/2016 2.2  0.7 - 4.0 K/uL Final  . Monocytes Relative 07/29/2016 6  % Final  . Monocytes Absolute 07/29/2016 0.8  0.1 - 1.0 K/uL Final  . Eosinophils Relative 07/29/2016 2  % Final  . Eosinophils Absolute 07/29/2016 0.2  0.0 - 0.7 K/uL Final  . Basophils Relative 07/29/2016 0  % Final  . Basophils Absolute 07/29/2016 0.0  0.0 - 0.1 K/uL Final  . TSH 07/29/2016 46.863* 0.350 - 4.500 uIU/mL Final   Performed by a 3rd Generation assay with a functional sensitivity of <=0.01 uIU/mL.  . Lactic Acid, Venous 07/29/2016 1.07  0.5 - 1.9 mmol/L Final  . Troponin I 07/29/2016 <0.03  <0.03 ng/mL Final  Admission on  07/13/2016, Discharged on 07/14/2016  Component Date Value Ref Range Status  . Sodium 07/13/2016 137  135 - 145 mmol/L Final  . Potassium 07/13/2016 3.8  3.5 - 5.1 mmol/L Final  . Chloride 07/13/2016 103  101 - 111 mmol/L Final  . CO2 07/13/2016 26  22 - 32 mmol/L Final  . Glucose, Bld 07/13/2016 109* 65 - 99 mg/dL Final  . BUN 07/13/2016 14  6 - 20 mg/dL Final  . Creatinine, Ser 07/13/2016 0.72  0.44 - 1.00 mg/dL Final  . Calcium 07/13/2016 8.9  8.9 - 10.3 mg/dL Final  . Total Protein 07/13/2016 7.2  6.5 - 8.1 g/dL Final  . Albumin 07/13/2016 4.0  3.5 - 5.0 g/dL Final  . AST 07/13/2016 22  15 - 41 U/L Final  . ALT 07/13/2016 19  14 - 54 U/L Final  . Alkaline Phosphatase 07/13/2016 75  38 - 126 U/L Final  . Total Bilirubin 07/13/2016 0.4  0.3 - 1.2 mg/dL Final  . GFR calc non Af Amer 07/13/2016 >60  >60 mL/min Final  . GFR calc Af Amer 07/13/2016 >60  >60 mL/min  Final   Comment: (NOTE) The eGFR has been calculated using the CKD EPI equation. This calculation has not been validated in all clinical situations. eGFR's persistently <60 mL/min signify possible Chronic Kidney Disease.   . Anion gap 07/13/2016 8  5 - 15 Final  . Alcohol, Ethyl (B) 07/13/2016 <5  <5 mg/dL Final   Comment:        LOWEST DETECTABLE LIMIT FOR SERUM ALCOHOL IS 5 mg/dL FOR MEDICAL PURPOSES ONLY   . Salicylate Lvl 37/16/9678 <7.0  2.8 - 30.0 mg/dL Final  . Acetaminophen (Tylenol), Serum 07/13/2016 <10* 10 - 30 ug/mL Final   Comment:        THERAPEUTIC CONCENTRATIONS VARY SIGNIFICANTLY. A RANGE OF 10-30 ug/mL MAY BE AN EFFECTIVE CONCENTRATION FOR MANY PATIENTS. HOWEVER, SOME ARE BEST TREATED AT CONCENTRATIONS OUTSIDE THIS RANGE. ACETAMINOPHEN CONCENTRATIONS >150 ug/mL AT 4 HOURS AFTER INGESTION AND >50 ug/mL AT 12 HOURS AFTER INGESTION ARE OFTEN ASSOCIATED WITH TOXIC REACTIONS.   . WBC 07/13/2016 12.3* 4.0 - 10.5 K/uL Final  . RBC 07/13/2016 4.51  3.87 - 5.11 MIL/uL Final  . Hemoglobin 07/13/2016 13.8  12.0 - 15.0 g/dL Final  . HCT 07/13/2016 42.5  36.0 - 46.0 % Final  . MCV 07/13/2016 94.2  78.0 - 100.0 fL Final  . MCH 07/13/2016 30.6  26.0 - 34.0 pg Final  . MCHC 07/13/2016 32.5  30.0 - 36.0 g/dL Final  . RDW 07/13/2016 13.9  11.5 - 15.5 % Final  . Platelets 07/13/2016 302  150 - 400 K/uL Final  . Opiates 07/14/2016 POSITIVE* NONE DETECTED Final  . Cocaine 07/14/2016 NONE DETECTED  NONE DETECTED Final  . Benzodiazepines 07/14/2016 POSITIVE* NONE DETECTED Final  . Amphetamines 07/14/2016 NONE DETECTED  NONE DETECTED Final  . Tetrahydrocannabinol 07/14/2016 NONE DETECTED  NONE DETECTED Final  . Barbiturates 07/14/2016 NONE DETECTED  NONE DETECTED Final   Comment:        DRUG SCREEN FOR MEDICAL PURPOSES ONLY.  IF CONFIRMATION IS NEEDED FOR ANY PURPOSE, NOTIFY LAB WITHIN 5 DAYS.        LOWEST DETECTABLE LIMITS FOR URINE DRUG SCREEN Drug Class        Cutoff (ng/mL) Amphetamine      1000 Barbiturate      200 Benzodiazepine   938 Tricyclics       101 Opiates  300 Cocaine          300 THC              50   . Color, Urine 07/14/2016 YELLOW  YELLOW Final  . APPearance 07/14/2016 HAZY* CLEAR Final  . Specific Gravity, Urine 07/14/2016 1.032* 1.005 - 1.030 Final  . pH 07/14/2016 5.0  5.0 - 8.0 Final  . Glucose, UA 07/14/2016 NEGATIVE  NEGATIVE mg/dL Final  . Hgb urine dipstick 07/14/2016 NEGATIVE  NEGATIVE Final  . Bilirubin Urine 07/14/2016 NEGATIVE  NEGATIVE Final  . Ketones, ur 07/14/2016 5* NEGATIVE mg/dL Final  . Protein, ur 07/14/2016 NEGATIVE  NEGATIVE mg/dL Final  . Nitrite 07/14/2016 NEGATIVE  NEGATIVE Final  . Leukocytes, UA 07/14/2016 LARGE* NEGATIVE Final  . RBC / HPF 07/14/2016 6-30  0 - 5 RBC/hpf Final  . WBC, UA 07/14/2016 6-30  0 - 5 WBC/hpf Final  . Bacteria, UA 07/14/2016 RARE* NONE SEEN Final  . Squamous Epithelial / LPF 07/14/2016 0-5* NONE SEEN Final  . Mucous 07/14/2016 PRESENT   Final  Admission on 07/07/2016, Discharged on 07/09/2016  Component Date Value Ref Range Status  . WBC 07/07/2016 11.3* 4.0 - 10.5 K/uL Final  . RBC 07/07/2016 4.10  3.87 - 5.11 MIL/uL Final  . Hemoglobin 07/07/2016 12.5  12.0 - 15.0 g/dL Final  . HCT 07/07/2016 39.1  36.0 - 46.0 % Final  . MCV 07/07/2016 95.4  78.0 - 100.0 fL Final  . MCH 07/07/2016 30.5  26.0 - 34.0 pg Final  . MCHC 07/07/2016 32.0  30.0 - 36.0 g/dL Final  . RDW 07/07/2016 14.4  11.5 - 15.5 % Final  . Platelets 07/07/2016 332  150 - 400 K/uL Final  . Sodium 07/07/2016 143  135 - 145 mmol/L Final  . Potassium 07/07/2016 3.6  3.5 - 5.1 mmol/L Final  . Chloride 07/07/2016 110  101 - 111 mmol/L Final  . CO2 07/07/2016 27  22 - 32 mmol/L Final  . Glucose, Bld 07/07/2016 94  65 - 99 mg/dL Final  . BUN 07/07/2016 12  6 - 20 mg/dL Final  . Creatinine, Ser 07/07/2016 0.74  0.44 - 1.00 mg/dL Final  . Calcium 07/07/2016 8.6* 8.9 - 10.3 mg/dL Final  . Total  Protein 07/07/2016 6.9  6.5 - 8.1 g/dL Final  . Albumin 07/07/2016 3.9  3.5 - 5.0 g/dL Final  . AST 07/07/2016 25  15 - 41 U/L Final  . ALT 07/07/2016 24  14 - 54 U/L Final  . Alkaline Phosphatase 07/07/2016 75  38 - 126 U/L Final  . Total Bilirubin 07/07/2016 0.4  0.3 - 1.2 mg/dL Final  . GFR calc non Af Amer 07/07/2016 >60  >60 mL/min Final  . GFR calc Af Amer 07/07/2016 >60  >60 mL/min Final   Comment: (NOTE) The eGFR has been calculated using the CKD EPI equation. This calculation has not been validated in all clinical situations. eGFR's persistently <60 mL/min signify possible Chronic Kidney Disease.   . Anion gap 07/07/2016 6  5 - 15 Final  . Color, Urine 07/07/2016 YELLOW  YELLOW Final  . APPearance 07/07/2016 HAZY* CLEAR Final  . Specific Gravity, Urine 07/07/2016 1.032* 1.005 - 1.030 Final  . pH 07/07/2016 5.0  5.0 - 8.0 Final  . Glucose, UA 07/07/2016 NEGATIVE  NEGATIVE mg/dL Final  . Hgb urine dipstick 07/07/2016 NEGATIVE  NEGATIVE Final  . Bilirubin Urine 07/07/2016 NEGATIVE  NEGATIVE Final  . Ketones, ur 07/07/2016 5* NEGATIVE mg/dL Final  .  Protein, ur 07/07/2016 30* NEGATIVE mg/dL Final  . Nitrite 07/07/2016 NEGATIVE  NEGATIVE Final  . Leukocytes, UA 07/07/2016 MODERATE* NEGATIVE Final  . RBC / HPF 07/07/2016 0-5  0 - 5 RBC/hpf Final  . WBC, UA 07/07/2016 6-30  0 - 5 WBC/hpf Final  . Bacteria, UA 07/07/2016 RARE* NONE SEEN Final  . Squamous Epithelial / LPF 07/07/2016 0-5* NONE SEEN Final  . Mucous 07/07/2016 PRESENT   Final  . Alcohol, Ethyl (B) 07/07/2016 <5  <5 mg/dL Final   Comment:        LOWEST DETECTABLE LIMIT FOR SERUM ALCOHOL IS 5 mg/dL FOR MEDICAL PURPOSES ONLY   . Opiates 07/07/2016 POSITIVE* NONE DETECTED Final  . Cocaine 07/07/2016 NONE DETECTED  NONE DETECTED Final  . Benzodiazepines 07/07/2016 POSITIVE* NONE DETECTED Final  . Amphetamines 07/07/2016 NONE DETECTED  NONE DETECTED Final  . Tetrahydrocannabinol 07/07/2016 NONE DETECTED  NONE DETECTED  Final  . Barbiturates 07/07/2016 NONE DETECTED  NONE DETECTED Final   Comment:        DRUG SCREEN FOR MEDICAL PURPOSES ONLY.  IF CONFIRMATION IS NEEDED FOR ANY PURPOSE, NOTIFY LAB WITHIN 5 DAYS.        LOWEST DETECTABLE LIMITS FOR URINE DRUG SCREEN Drug Class       Cutoff (ng/mL) Amphetamine      1000 Barbiturate      200 Benzodiazepine   628 Tricyclics       366 Opiates          300 Cocaine          300 THC              50   . Specimen Description 07/07/2016 URINE, CLEAN CATCH   Final  . Special Requests 07/07/2016 NONE   Final  . Culture 07/07/2016 MULTIPLE SPECIES PRESENT, SUGGEST RECOLLECTION*  Final  . Report Status 07/07/2016 07/09/2016 FINAL   Final  Admission on 06/24/2016, Discharged on 06/25/2016  Component Date Value Ref Range Status  . WBC 06/24/2016 14.7* 4.0 - 10.5 K/uL Final  . RBC 06/24/2016 4.96  3.87 - 5.11 MIL/uL Final  . Hemoglobin 06/24/2016 14.8  12.0 - 15.0 g/dL Final  . HCT 06/24/2016 45.7  36.0 - 46.0 % Final  . MCV 06/24/2016 92.1  78.0 - 100.0 fL Final  . MCH 06/24/2016 29.8  26.0 - 34.0 pg Final  . MCHC 06/24/2016 32.4  30.0 - 36.0 g/dL Final  . RDW 06/24/2016 13.9  11.5 - 15.5 % Final  . Platelets 06/24/2016 375  150 - 400 K/uL Final  . Neutrophils Relative % 06/24/2016 75  % Final  . Neutro Abs 06/24/2016 10.9* 1.7 - 7.7 K/uL Final  . Lymphocytes Relative 06/24/2016 18  % Final  . Lymphs Abs 06/24/2016 2.7  0.7 - 4.0 K/uL Final  . Monocytes Relative 06/24/2016 6  % Final  . Monocytes Absolute 06/24/2016 0.9  0.1 - 1.0 K/uL Final  . Eosinophils Relative 06/24/2016 1  % Final  . Eosinophils Absolute 06/24/2016 0.2  0.0 - 0.7 K/uL Final  . Basophils Relative 06/24/2016 0  % Final  . Basophils Absolute 06/24/2016 0.1  0.0 - 0.1 K/uL Final  . Sodium 06/24/2016 137  135 - 145 mmol/L Final  . Potassium 06/24/2016 4.3  3.5 - 5.1 mmol/L Final  . Chloride 06/24/2016 102  101 - 111 mmol/L Final  . CO2 06/24/2016 25  22 - 32 mmol/L Final  . Glucose,  Bld 06/24/2016 105* 65 - 99 mg/dL Final  .  BUN 06/24/2016 18  6 - 20 mg/dL Final  . Creatinine, Ser 06/24/2016 0.98  0.44 - 1.00 mg/dL Final  . Calcium 06/24/2016 8.9  8.9 - 10.3 mg/dL Final  . Total Protein 06/24/2016 8.2* 6.5 - 8.1 g/dL Final  . Albumin 06/24/2016 4.2  3.5 - 5.0 g/dL Final  . AST 06/24/2016 36  15 - 41 U/L Final  . ALT 06/24/2016 20  14 - 54 U/L Final  . Alkaline Phosphatase 06/24/2016 82  38 - 126 U/L Final  . Total Bilirubin 06/24/2016 1.5* 0.3 - 1.2 mg/dL Final  . GFR calc non Af Amer 06/24/2016 58* >60 mL/min Final  . GFR calc Af Amer 06/24/2016 >60  >60 mL/min Final   Comment: (NOTE) The eGFR has been calculated using the CKD EPI equation. This calculation has not been validated in all clinical situations. eGFR's persistently <60 mL/min signify possible Chronic Kidney Disease.   . Anion gap 06/24/2016 10  5 - 15 Final  . Color, Urine 06/25/2016 YELLOW  YELLOW Final  . APPearance 06/25/2016 HAZY* CLEAR Final  . Specific Gravity, Urine 06/25/2016 1.027  1.005 - 1.030 Final  . pH 06/25/2016 5.0  5.0 - 8.0 Final  . Glucose, UA 06/25/2016 NEGATIVE  NEGATIVE mg/dL Final  . Hgb urine dipstick 06/25/2016 NEGATIVE  NEGATIVE Final  . Bilirubin Urine 06/25/2016 NEGATIVE  NEGATIVE Final  . Ketones, ur 06/25/2016 20* NEGATIVE mg/dL Final  . Protein, ur 06/25/2016 NEGATIVE  NEGATIVE mg/dL Final  . Nitrite 06/25/2016 NEGATIVE  NEGATIVE Final  . Leukocytes, UA 06/25/2016 LARGE* NEGATIVE Final  . RBC / HPF 06/25/2016 6-30  0 - 5 RBC/hpf Final  . WBC, UA 06/25/2016 TOO NUMEROUS TO COUNT  0 - 5 WBC/hpf Final  . Bacteria, UA 06/25/2016 RARE* NONE SEEN Final  . Squamous Epithelial / LPF 06/25/2016 6-30* NONE SEEN Final  . Mucous 06/25/2016 PRESENT   Final  . Opiates 06/25/2016 POSITIVE* NONE DETECTED Final  . Cocaine 06/25/2016 NONE DETECTED  NONE DETECTED Final  . Benzodiazepines 06/25/2016 POSITIVE* NONE DETECTED Final  . Amphetamines 06/25/2016 NONE DETECTED  NONE DETECTED  Final  . Tetrahydrocannabinol 06/25/2016 NONE DETECTED  NONE DETECTED Final  . Barbiturates 06/25/2016 NONE DETECTED  NONE DETECTED Final   Comment:        DRUG SCREEN FOR MEDICAL PURPOSES ONLY.  IF CONFIRMATION IS NEEDED FOR ANY PURPOSE, NOTIFY LAB WITHIN 5 DAYS.        LOWEST DETECTABLE LIMITS FOR URINE DRUG SCREEN Drug Class       Cutoff (ng/mL) Amphetamine      1000 Barbiturate      200 Benzodiazepine   573 Tricyclics       220 Opiates          300 Cocaine          300 THC              50   . Acetaminophen (Tylenol), Serum 06/24/2016 <10* 10 - 30 ug/mL Final   Comment:        THERAPEUTIC CONCENTRATIONS VARY SIGNIFICANTLY. A RANGE OF 10-30 ug/mL MAY BE AN EFFECTIVE CONCENTRATION FOR MANY PATIENTS. HOWEVER, SOME ARE BEST TREATED AT CONCENTRATIONS OUTSIDE THIS RANGE. ACETAMINOPHEN CONCENTRATIONS >150 ug/mL AT 4 HOURS AFTER INGESTION AND >50 ug/mL AT 12 HOURS AFTER INGESTION ARE OFTEN ASSOCIATED WITH TOXIC REACTIONS.   . Salicylate Lvl 25/42/7062 <7.0  2.8 - 30.0 mg/dL Final    Dg Chest 2 View  Result Date: 07/29/2016 CLINICAL DATA:  Low-grade  fevers EXAM: CHEST  2 VIEW COMPARISON:  07/28/2016 FINDINGS: Cardiac shadow is at the upper limits of normal in size. The lungs are hypoinflated with some crowding of the vascular markings. This is stable from the prior exam. Mild left basilar atelectasis is noted. No sizable effusion is seen. Postsurgical changes in the right shoulder are noted. IMPRESSION: Stable hypoinflation and mild left basilar atelectasis. Electronically Signed   By: Inez Catalina M.D.   On: 07/29/2016 07:57   Dg Chest 2 View  Result Date: 07/28/2016 CLINICAL DATA:  Mental status changes. EXAM: CHEST  2 VIEW COMPARISON:  06/24/2016 FINDINGS: Low volume film. The cardio pericardial silhouette is enlarged. Underlying interstitial changes again noted pneumonia at the left lung base cannot be entirely excluded. Patient is status post right shoulder replacement.  IMPRESSION: Cardiomegaly with low volumes and potential airspace disease/ pneumonia at the left base. Electronically Signed   By: Misty Stanley M.D.   On: 07/28/2016 20:26   Dg Ribs Bilateral W/chest  Result Date: 08/18/2016 CLINICAL DATA:  Chest pain. Fall at home yesterday with bilateral rib pain. EXAM: BILATERAL RIBS AND CHEST - 4+ VIEW COMPARISON:  Chest radiograph 08/03/2016 FINDINGS: No fracture or other bone lesions are seen involving the ribs. There is no evidence of pneumothorax or pleural effusion. Both lungs are clear. Heart size and mediastinal contours are unchanged from prior exam. Low lung volumes persist. 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 IMPRESSION: No evidence of acute rib fracture. Electronically Signed   By: Jeb Levering M.D.   On: 08/18/2016 20:18   Dg Elbow Complete Left  Result Date: 08/18/2016 CLINICAL DATA:  Left shoulder and elbow pain status post fall. EXAM: LEFT ELBOW - COMPLETE 3+ VIEW COMPARISON:  None. FINDINGS: There is a curvilinear lucency through the coronoid process of the ulna, which may represent un avulsion fracture or osteoarthritic osteophyte. The elbow is normally located. There is a mild soft tissue swelling. IMPRESSION: Curvilinear lucency through the coronoid process of the ulna, which may represent an avulsion fracture or an osteophyte. Please correlate to possible point tenderness. Electronically Signed   By: Fidela Salisbury M.D.   On: 08/18/2016 12:47   Ct Head Wo Contrast  Result Date: 07/28/2016 CLINICAL DATA:  Schizophrenia. Altered mental status. Disorganized speech. EXAM: CT HEAD WITHOUT CONTRAST TECHNIQUE: Contiguous axial images were obtained from the base of the skull through the vertex without intravenous contrast. COMPARISON:  10/27/2015 FINDINGS: Brain: Mild generalized brain atrophy. No evidence of old or acute focal infarction, mass lesion, hemorrhage, hydrocephalus or extra-axial collection. Vascular: There is  atherosclerotic calcification of the major vessels at the base of the brain. Skull: Normal Sinuses/Orbits: Clear/normal Other: None IMPRESSION: Normal head CT for age. Electronically Signed   By: Nelson Chimes M.D.   On: 07/28/2016 14:39   Ct Cervical Spine Wo Contrast  Result Date: 08/18/2016 CLINICAL DATA:  Neck pain after dresser fell on patient today. Initial encounter. EXAM: CT CERVICAL SPINE WITHOUT CONTRAST TECHNIQUE: Multidetector CT imaging of the cervical spine was performed without intravenous contrast. Multiplanar CT image reconstructions were also generated. COMPARISON:  None. FINDINGS: Alignment: No traumatic malalignment Skull base and vertebrae: Negative for acute fracture. Soft tissues and spinal canal: No prevertebral fluid or swelling. No visible canal hematoma. Disc levels: Multilevel facet arthropathy with bulky spurring. Disc degeneration greatest at C5-6 and C6-7. Bilateral foraminal narrowings. No indication of advanced spinal stenosis. Upper chest: Negative Other: Soft tissue stranding along the partly seen left clavicle. IMPRESSION: 1. No evidence  of acute cervical spine injury. 2. Soft tissue swelling around the partly visualized left clavicle, consider radiography. Electronically Signed   By: Monte Fantasia M.D.   On: 08/18/2016 12:29   Dg Chest Portable 1 View  Result Date: 08/03/2016 CLINICAL DATA:  Altered mental status EXAM: PORTABLE CHEST 1 VIEW COMPARISON:  Chest radiograph 08/06/2016 FINDINGS: Unchanged cardiomediastinal contours with shallow lung inflation. No focal airspace consolidation or pulmonary edema. Right shoulder arthroplasty again noted. IMPRESSION: No active disease. Electronically Signed   By: Ulyses Jarred M.D.   On: 08/03/2016 05:32   Dg Shoulder Left  Result Date: 08/18/2016 CLINICAL DATA:  Left shoulder pain, post fall. EXAM: LEFT SHOULDER - 2+ VIEW COMPARISON:  None. FINDINGS: There is a comminuted intra-articular fracture of the left humeral head  with extension to the surgical neck. The fracture is impacted with posterior dislocation of the posterior fracture fragment. The humeral head is inferiorly and posteriorly subluxed, which may be due to intra-articular hematoma. IMPRESSION: Comminuted impacted intra-articular fracture of the left humeral head/surgical neck with mild posterior inferior subluxation, which may be due to intracapsular hematoma. Electronically Signed   By: Fidela Salisbury M.D.   On: 08/18/2016 12:55     Assessment/Plan   Avoid  NSAID tx at this time as she may have sx on LUE soon. Cont LUE sling  Increase Tylenol #3 to 2 tabs q8hr for better pain control. Hold for sedation/respiratory depression  D/c zocor as she is already on lipitor  Check CBC, CMP, Mg and Phos  Reck TSH and free T4 in 4 weeks  Cont other meds as ordered  f/u with ortho for mx LUE fx  F/u with psych for med mx as scheduled  PT/OT/ST as ordered  Wound care as ordered  GOAL: short term rehab and d/c home when medically appropriate. Communicated with pt and nursing.  Will follow   Lilliana Turner S. Perlie Gold  [facility] [address] [place], [place] [address] [phone] Cell (Monday-Friday 8 AM - 5 PM) [phone] After 5 PM and follow prompts

## 2016-08-23 LAB — CBC AND DIFFERENTIAL
HCT: 35 % — AB (ref 36–46)
Hemoglobin: 11.7 g/dL — AB (ref 12.0–16.0)
NEUTROS ABS: 6 /uL
PLATELETS: 305 10*3/uL (ref 150–399)
WBC: 8.9 10*3/mL

## 2016-08-23 LAB — BASIC METABOLIC PANEL
BUN: 13 mg/dL (ref 4–21)
Creatinine: 0.6 mg/dL (ref 0.5–1.1)
GLUCOSE: 107 mg/dL
Potassium: 4.3 mmol/L (ref 3.4–5.3)
Sodium: 137 mmol/L (ref 137–147)

## 2016-08-23 LAB — HEPATIC FUNCTION PANEL
ALK PHOS: 74 U/L (ref 25–125)
ALT: 18 U/L (ref 7–35)
AST: 18 U/L (ref 13–35)
BILIRUBIN, TOTAL: 0.4 mg/dL

## 2016-08-26 ENCOUNTER — Encounter: Payer: Self-pay | Admitting: Adult Health

## 2016-08-26 ENCOUNTER — Non-Acute Institutional Stay (SKILLED_NURSING_FACILITY): Payer: Medicare Other | Admitting: Adult Health

## 2016-08-26 DIAGNOSIS — S42355S Nondisplaced comminuted fracture of shaft of humerus, left arm, sequela: Secondary | ICD-10-CM

## 2016-08-26 DIAGNOSIS — G4700 Insomnia, unspecified: Secondary | ICD-10-CM | POA: Insufficient documentation

## 2016-08-26 DIAGNOSIS — F5104 Psychophysiologic insomnia: Secondary | ICD-10-CM

## 2016-08-26 DIAGNOSIS — G40909 Epilepsy, unspecified, not intractable, without status epilepticus: Secondary | ICD-10-CM

## 2016-08-26 NOTE — Progress Notes (Signed)
Location:    [facility] Room Number: 116 A Place of Service:  SNF (31)   CODE STATUS: Full Code  Allergies  Allergen Reactions  . Amoxicillin Anaphylaxis and Other (See Comments)    Has patient had a PCN reaction causing immediate rash, facial/tongue/throat swelling, SOB or lightheadedness with hypotension: Yes Has patient had a PCN reaction causing severe rash involving mucus membranes or skin necrosis: No Has patient had a PCN reaction that required hospitalization No Has patient had a PCN reaction occurring within the last 10 years: No If all of the above answers are "NO", then may proceed with Cephalosporin use.  Marland Kitchen Penicillins Anaphylaxis and Other (See Comments)    Has patient had a PCN reaction causing immediate rash, facial/tongue/throat swelling, SOB or lightheadedness with hypotension: Yes Has patient had a PCN reaction causing severe rash involving mucus membranes or skin necrosis: No Has patient had a PCN reaction that required hospitalization No Has patient had a PCN reaction occurring within the last 10 years: No If all of the above answers are "NO", then may proceed with Cephalosporin use.   Marland Kitchen Phenytoin Other (See Comments)    Reaction:  CNS disorder   . Dilaudid [Hydromorphone Hcl] Other (See Comments)    Reaction:  Psychosis   . Haldol [Haloperidol] Other (See Comments)    Reaction:  Psychosis   . Morphine And Related Other (See Comments)    Reaction:  Psychosis     Chief Complaint  Patient presents with  . Acute Visit    Pain management    HPI:  She is complaining of uncontrolled left shoulder pain, not being on gabapentin for her seizures; concerned about being a diabetic; and is not sleeping.  She is wanting better pain relief. She is having difficulty participating in therapy due to her pain.    Past Medical History:  Diagnosis Date  . Arm pain   . Chronic pain   . COPD (chronic obstructive pulmonary disease) ([place])   . Coronary artery  disease   . Depression   . Dystonia   . Epilepsy ([place])   . High cholesterol   . Humerus fracture   . Hypothyroid   . Leukemia ([place])   . Myocardial infarct ([place])   . Schizophrenia ([place])   . Seizure [facility])     Past Surgical History:  Procedure Laterality Date  . EXTERNAL EAR SURGERY    . HUMERUS FRACTURE SURGERY    . SHOULDER SURGERY      Social History   Social History  . Marital status: Single    Spouse name: N/A  . Number of children: N/A  . Years of education: N/A   Occupational History  . Not on file.   Social History Main Topics  . Smoking status: Current Every Day Smoker    Packs/day: 2.00    Years: 41.00    Types: Cigarettes  . Smokeless tobacco: Never Used  . Alcohol use No  . Drug use: No  . Sexual activity: No   Other Topics Concern  . Not on file   Social History Narrative   Admitted to [facility] 08/20/16   Single   Smokes daily   Alcohol none   No Advanced Directive.         History reviewed. No pertinent family history.    VITAL SIGNS BP 122/74   Pulse 74   Temp 98.1 F (36.7 C)   Resp 18   Ht 5\' 1"  (1.549 m)  Wt 183 lb (83 kg)   SpO2 97%   BMI 34.58 kg/m   Patient's Medications  New Prescriptions   No medications on file  Previous Medications   ACETAMINOPHEN-CODEINE (TYLENOL #3) 300-30 MG TABLET    Take 2 tablets by mouth every 8 (eight) hours.   ARIPIPRAZOLE (ABILIFY) 20 MG TABLET    Take 1 tablet (20 mg total) by mouth daily.   ATORVASTATIN (LIPITOR) 20 MG TABLET    Take 20 mg by mouth. Take one tablet daily for hyperlipidemia   BACLOFEN (LIORESAL) 10 MG TABLET    Take 10 mg by mouth 3 (three) times daily as needed for muscle spasms.   BENZTROPINE (COGENTIN) 2 MG TABLET    Take 2 mg by mouth 3 (three) times daily.   LEVOTHYROXINE (SYNTHROID, LEVOTHROID) 88 MCG TABLET    Take 88 mcg by mouth daily before breakfast.   LORAZEPAM (ATIVAN) 1 MG TABLET    Take 1 tablet (1 mg total) by mouth 2 (two) times daily as  needed for anxiety.   NUTRITIONAL SUPPLEMENT LIQD    House 2.0 - Give 237 ml by mouth 2 times daily between meals for supplement   OMEPRAZOLE (PRILOSEC) 40 MG CAPSULE    Take 40 mg by mouth daily.   PROPRANOLOL (INDERAL) 10 MG TABLET    Take 10 mg by mouth 2 (two) times daily.   TEMAZEPAM (RESTORIL) 30 MG CAPSULE    Take 30 mg by mouth at bedtime as needed for sleep.   VENLAFAXINE XR (EFFEXOR-XR) 150 MG 24 HR CAPSULE    Take 300 mg by mouth daily with breakfast.  Modified Medications   No medications on file  Discontinued Medications   ACETAMINOPHEN-CODEINE (TYLENOL #3) 300-30 MG TABLET    Take 1 tablet by mouth every 8 (eight) hours as needed for moderate pain.   SIMVASTATIN (ZOCOR) 40 MG TABLET    Take 40 mg by mouth at bedtime.      SIGNIFICANT DIAGNOSTIC EXAMS  08-18-16: ct of cervical spine: 1. No evidence of acute cervical spine injury. 2. Soft tissue swelling around the partly visualized left clavicle, consider radiography.   08-18-16: left elbow x-ray: Curvilinear lucency through the coronoid process of the ulna, which may represent an avulsion fracture or an osteophyte. Please correlate to possible point tenderness.   08-18-16: left shoulder x-ray: Comminuted impacted intra-articular fracture of the left humeral head/surgical neck with mild posterior inferior subluxation, which may be due to intracapsular hematoma.   08-18-16: chest x-ray: No evidence of acute rib fracture.  LABS REVIEWED:   08-18-16: wbc 14.1; hgb 11.9; hct 37.3 mcv 95.4; plt 300; glucose 100; bun 28; creat 0.80; k+ 4.4; na++ 136; tsh 62.183 08-19-16: wbc 9.3; hgb 9.4; hct 30.4; mcv 96.8; plt 203; glucose 80; bun 20; creat 0.62; k+ 3.1; na++ 140; mag 1.7; phos 3.2; free T4: 0.34 08-20-16: wbc 8.2; hgb 9.5; hct 30.3; mcv 98.1; plt 225; glucose 88; bun 18; creat 0.54; k+ 3.9; na++ 139; ca 6.8; liver normal albumin 2.5; mag 1.8; phos 2.1; tsh 46.443; free T4: 0.33  08-22-16: wbc 9.3; hgb 11.7; hct 36.4; mcv 96.1; plt  281    Review of Systems  Constitutional: Negative for malaise/fatigue.  Respiratory: Negative for cough and shortness of breath.   Cardiovascular: Negative for chest pain, palpitations and leg swelling.  Gastrointestinal: Negative for abdominal pain, constipation and heartburn.  Musculoskeletal: Positive for joint pain. Negative for back pain and myalgias.       Left  shoulder pain not controlled   Skin: Negative.   Neurological: Negative for dizziness.  Psychiatric/Behavioral: The patient has insomnia. The patient is not nervous/anxious.      Physical Exam  Constitutional: She is oriented to person, place, and time. No distress.  Eyes: Conjunctivae are normal.  Neck: Neck supple. No JVD present. No thyromegaly present.  Cardiovascular: Normal rate, regular rhythm and intact distal pulses.   Respiratory: Effort normal and breath sounds normal. No respiratory distress. She has no wheezes.  GI: Soft. Bowel sounds are normal. She exhibits no distension. There is no tenderness.  Musculoskeletal: She exhibits no edema.  Left arm in sling and ace wrap Able to move other extremities  Lymphadenopathy:    She has no cervical adenopathy.  Neurological: She is alert and oriented to person, place, and time.  Skin: Skin is warm and dry. She is not diaphoretic.  Psychiatric:  Is anxious       ASSESSMENT/ PLAN:  1. Humerus fracture: will continue therapy as directed; her surgical intervention is pending; will follow up with orthopedics. Will change her tylenol #3 to codeine 60 mg every 6 hours routinely and will hold for sedation  2. Insomnia: will change her restoril to routine  3. Seizure disorder: at this time will not restart gabapentin due to her high risk for falls. Will setup a neurology consult to determine if she needs anti seizure medication. Will monitor     MD is aware of resident's narcotic use and is in agreement with current plan of care. We will attempt to wean  resident as apropriate   Ok Edwards NP [facility]  Contact [phone] Monday through Friday 8am- 5pm  After hours call [phone]

## 2016-08-30 ENCOUNTER — Encounter: Payer: Self-pay | Admitting: Adult Health

## 2016-08-30 ENCOUNTER — Non-Acute Institutional Stay (SKILLED_NURSING_FACILITY): Payer: Medicare Other | Admitting: Adult Health

## 2016-08-30 DIAGNOSIS — S42355S Nondisplaced comminuted fracture of shaft of humerus, left arm, sequela: Secondary | ICD-10-CM

## 2016-08-30 NOTE — Progress Notes (Signed)
Location:   [facility] Room Number: 116 A Place of Service:  SNF (31)   CODE STATUS: Full Code  Allergies  Allergen Reactions  . Amoxicillin Anaphylaxis and Other (See Comments)    Has patient had a PCN reaction causing immediate rash, facial/tongue/throat swelling, SOB or lightheadedness with hypotension: Yes Has patient had a PCN reaction causing severe rash involving mucus membranes or skin necrosis: No Has patient had a PCN reaction that required hospitalization No Has patient had a PCN reaction occurring within the last 10 years: No If all of the above answers are "NO", then may proceed with Cephalosporin use.  Marland Kitchen Penicillins Anaphylaxis and Other (See Comments)    Has patient had a PCN reaction causing immediate rash, facial/tongue/throat swelling, SOB or lightheadedness with hypotension: Yes Has patient had a PCN reaction causing severe rash involving mucus membranes or skin necrosis: No Has patient had a PCN reaction that required hospitalization No Has patient had a PCN reaction occurring within the last 10 years: No If all of the above answers are "NO", then may proceed with Cephalosporin use.   Marland Kitchen Phenytoin Other (See Comments)    Reaction:  CNS disorder   . Dilaudid [Hydromorphone Hcl] Other (See Comments)    Reaction:  Psychosis   . Haldol [Haloperidol] Other (See Comments)    Reaction:  Psychosis   . Morphine And Related Other (See Comments)    Reaction:  Psychosis     Chief Complaint  Patient presents with  . Acute Visit    Pain Management    HPI:  She is having right shoulder pain which is not being managed. She states that the pain is severe and that she cannot move her arm. She is not wanting to participate in therapy due to her pain. We have discussed her pain management and she tells me that she will participate in therapy if we can get her pain under control.   Past Medical History:  Diagnosis Date  . Arm pain   . Chronic pain   . COPD  (chronic obstructive pulmonary disease) ([place])   . Coronary artery disease   . Depression   . Dystonia   . Epilepsy ([place])   . High cholesterol   . Humerus fracture   . Hypothyroid   . Leukemia ([place])   . Myocardial infarct ([place])   . Schizophrenia ([place])   . Seizure [facility])     Past Surgical History:  Procedure Laterality Date  . EXTERNAL EAR SURGERY    . HUMERUS FRACTURE SURGERY    . SHOULDER SURGERY      Social History   Social History  . Marital status: Single    Spouse name: N/A  . Number of children: N/A  . Years of education: N/A   Occupational History  . Not on file.   Social History Main Topics  . Smoking status: Current Every Day Smoker    Packs/day: 2.00    Years: 41.00    Types: Cigarettes  . Smokeless tobacco: Never Used  . Alcohol use No  . Drug use: No  . Sexual activity: No   Other Topics Concern  . Not on file   Social History Narrative   Admitted to [facility] 08/20/16   Single   Smokes daily   Alcohol none   No Advanced Directive.         History reviewed. No pertinent family history.    VITAL SIGNS BP 122/74   Pulse 74  Temp 98.1 F (36.7 C)   Resp 18   Ht 5\' 1"  (1.549 m)   Wt 183 lb (83 kg)   SpO2 97%   BMI 34.58 kg/m   Patient's Medications  New Prescriptions   No medications on file  Previous Medications   ARIPIPRAZOLE (ABILIFY) 20 MG TABLET    Take 1 tablet (20 mg total) by mouth daily.   ATORVASTATIN (LIPITOR) 20 MG TABLET    Take 20 mg by mouth at bedtime. Take one tablet daily for hyperlipidemia    BACLOFEN (LIORESAL) 10 MG TABLET    Take 10 mg by mouth 3 (three) times daily as needed for muscle spasms.   BENZTROPINE (COGENTIN) 2 MG TABLET    Take 2 mg by mouth 3 (three) times daily.   CODEINE 60 MG TABLET    Take 60 mg by mouth every 6 (six) hours as needed for pain. Hold for sedation / confusion   LEVOTHYROXINE (SYNTHROID, LEVOTHROID) 88 MCG TABLET    Take 88 mcg by mouth daily before breakfast.    LORAZEPAM (ATIVAN) 1 MG TABLET    Take 1 tablet (1 mg total) by mouth 2 (two) times daily as needed for anxiety.   NUTRITIONAL SUPPLEMENT LIQD    House 2.0 - Give 237 ml by mouth 2 times daily between meals for supplement   OMEPRAZOLE (PRILOSEC) 40 MG CAPSULE    Take 40 mg by mouth daily.   PROPRANOLOL (INDERAL) 10 MG TABLET    Take 10 mg by mouth 2 (two) times daily.   TEMAZEPAM (RESTORIL) 30 MG CAPSULE    Take 30 mg by mouth at bedtime as needed for sleep.   VENLAFAXINE XR (EFFEXOR-XR) 150 MG 24 HR CAPSULE    Take 300 mg by mouth daily with breakfast.  Modified Medications   No medications on file  Discontinued Medications   ACETAMINOPHEN-CODEINE (TYLENOL #3) 300-30 MG TABLET    Take 2 tablets by mouth every 8 (eight) hours.     SIGNIFICANT DIAGNOSTIC EXAMS  08-18-16: ct of cervical spine: 1. No evidence of acute cervical spine injury. 2. Soft tissue swelling around the partly visualized left clavicle, consider radiography.   08-18-16: left elbow x-ray: Curvilinear lucency through the coronoid process of the ulna, which may represent an avulsion fracture or an osteophyte. Please correlate to possible point tenderness.   08-18-16: left shoulder x-ray: Comminuted impacted intra-articular fracture of the left humeral head/surgical neck with mild posterior inferior subluxation, which may be due to intracapsular hematoma.   08-18-16: chest x-ray: No evidence of acute rib fracture.  LABS REVIEWED:   08-18-16: wbc 14.1; hgb 11.9; hct 37.3 mcv 95.4; plt 300; glucose 100; bun 28; creat 0.80; k+ 4.4; na++ 136; tsh 62.183 08-19-16: wbc 9.3; hgb 9.4; hct 30.4; mcv 96.8; plt 203; glucose 80; bun 20; creat 0.62; k+ 3.1; na++ 140; mag 1.7; phos 3.2; free T4: 0.34 08-20-16: wbc 8.2; hgb 9.5; hct 30.3; mcv 98.1; plt 225; glucose 88; bun 18; creat 0.54; k+ 3.9; na++ 139; ca 6.8; liver normal albumin 2.5; mag 1.8; phos 2.1; tsh 46.443; free T4: 0.33  08-22-16: wbc 9.3; hgb 11.7; hct 36.4; mcv 96.1; plt  281    Review of Systems  Constitutional: Negative for malaise/fatigue.  Respiratory: Negative for cough and shortness of breath.   Cardiovascular: Negative for chest pain, palpitations and leg swelling.  Gastrointestinal: Negative for abdominal pain, constipation and heartburn.  Musculoskeletal: Positive for joint pain. Negative for back pain and myalgias.  Left shoulder pain not controlled   Skin: Negative.   Neurological: Negative for dizziness.  Psychiatric/Behavioral: The patient has insomnia. The patient is not nervous/anxious.      Physical Exam  Constitutional: She is oriented to person, place, and time. No distress.  Eyes: Conjunctivae are normal.  Neck: Neck supple. No JVD present. No thyromegaly present.  Cardiovascular: Normal rate, regular rhythm and intact distal pulses.   Respiratory: Effort normal and breath sounds normal. No respiratory distress. She has no wheezes.  GI: Soft. Bowel sounds are normal. She exhibits no distension. There is no tenderness.  Musculoskeletal: She exhibits no edema.  Left arm in sling and ace wrap Has bruising present.  Able to move other extremities  Lymphadenopathy:    She has no cervical adenopathy.  Neurological: She is alert and oriented to person, place, and time.  Skin: Skin is warm and dry. She is not diaphoretic.  Psychiatric:  Is anxious       ASSESSMENT/ PLAN:  1. Humerus fracture: will continue therapy as directed; her surgical intervention is pending; will follow up with orthopedics.  Will give her oxycodone 10 mg now and then begin 5 or 10 mg every 4 hours as needed for pain and will monitor   Time spent with patient  40   minutes >50% time spent counseling; reviewing medical record; tests; labs; and developing future plan of care    MD is aware of resident's narcotic use and is in agreement with current plan of care. We will attempt to wean resident as apropriate     Ok Edwards NP [facility]  Contact [phone] Monday through Friday 8am- 5pm  After hours call [phone]

## 2016-08-31 ENCOUNTER — Encounter: Payer: Self-pay | Admitting: Adult Health

## 2016-08-31 ENCOUNTER — Non-Acute Institutional Stay (SKILLED_NURSING_FACILITY): Payer: Medicare Other | Admitting: Adult Health

## 2016-08-31 DIAGNOSIS — S42355S Nondisplaced comminuted fracture of shaft of humerus, left arm, sequela: Secondary | ICD-10-CM

## 2016-08-31 NOTE — Progress Notes (Signed)
Location:   [place] Room Number: 116A Place of Service:  SNF (31)   CODE STATUS: Full Code  Allergies  Allergen Reactions  . Amoxicillin Anaphylaxis and Other (See Comments)    Has patient had a PCN reaction causing immediate rash, facial/tongue/throat swelling, SOB or lightheadedness with hypotension: Yes Has patient had a PCN reaction causing severe rash involving mucus membranes or skin necrosis: No Has patient had a PCN reaction that required hospitalization No Has patient had a PCN reaction occurring within the last 10 years: No If all of the above answers are "NO", then may proceed with Cephalosporin use.  Marland Kitchen Penicillins Anaphylaxis and Other (See Comments)    Has patient had a PCN reaction causing immediate rash, facial/tongue/throat swelling, SOB or lightheadedness with hypotension: Yes Has patient had a PCN reaction causing severe rash involving mucus membranes or skin necrosis: No Has patient had a PCN reaction that required hospitalization No Has patient had a PCN reaction occurring within the last 10 years: No If all of the above answers are "NO", then may proceed with Cephalosporin use.   Marland Kitchen Phenytoin Other (See Comments)    Reaction:  CNS disorder   . Dilaudid [Hydromorphone Hcl] Other (See Comments)    Reaction:  Psychosis   . Haldol [Haloperidol] Other (See Comments)    Reaction:  Psychosis   . Morphine And Related Other (See Comments)    Reaction:  Psychosis     Chief Complaint  Patient presents with  . Acute Visit    Therapy Evaluation    HPI:  Therapy reports that she is not willing to participate in therapy and have asked to help with this situation. She says that her pain is being managed. She also says that she will be going home in the AM as her insurance is running out. I explained that if she will participate in therapy her insurance will continue to cover her stay. With a great amount of effort and talking she did get up to the side of  the bed for 17 minutes with assistance   Past Medical History:  Diagnosis Date  . Arm pain   . Chronic pain   . COPD (chronic obstructive pulmonary disease) ([place])   . Coronary artery disease   . Depression   . Dystonia   . Epilepsy ([place])   . High cholesterol   . Humerus fracture   . Hypothyroid   . Leukemia ([place])   . Myocardial infarct ([place])   . Schizophrenia ([place])   . Seizure [facility])     Past Surgical History:  Procedure Laterality Date  . EXTERNAL EAR SURGERY    . HUMERUS FRACTURE SURGERY    . SHOULDER SURGERY      Social History   Social History  . Marital status: Single    Spouse name: N/A  . Number of children: N/A  . Years of education: N/A   Occupational History  . Not on file.   Social History Main Topics  . Smoking status: Current Every Day Smoker    Packs/day: 2.00    Years: 41.00    Types: Cigarettes  . Smokeless tobacco: Never Used  . Alcohol use No  . Drug use: No  . Sexual activity: No   Other Topics Concern  . Not on file   Social History Narrative   Admitted to [facility] 08/20/16   Single   Smokes daily   Alcohol none   No Advanced Directive.  History reviewed. No pertinent family history.    VITAL SIGNS BP 122/74   Pulse 74   Temp 98.1 F (36.7 C)   Resp 18   Ht 5\' 1"  (1.549 m)   Wt 183 lb (83 kg)   SpO2 97%   BMI 34.58 kg/m   Patient's Medications  New Prescriptions   No medications on file  Previous Medications   ARIPIPRAZOLE (ABILIFY) 20 MG TABLET    Take 1 tablet (20 mg total) by mouth daily.   ATORVASTATIN (LIPITOR) 20 MG TABLET    Take 20 mg by mouth at bedtime. Take one tablet daily for hyperlipidemia    BACLOFEN (LIORESAL) 10 MG TABLET    Take 10 mg by mouth 3 (three) times daily as needed for muscle spasms.   BENZTROPINE (COGENTIN) 2 MG TABLET    Take 2 mg by mouth 3 (three) times daily.   LEVOTHYROXINE (SYNTHROID, LEVOTHROID) 88 MCG TABLET    Take 88 mcg by mouth daily before breakfast.    LORAZEPAM (ATIVAN) 1 MG TABLET    Take 1 tablet (1 mg total) by mouth 2 (two) times daily as needed for anxiety.   NUTRITIONAL SUPPLEMENT LIQD    House 2.0 - Give 237 ml by mouth 2 times daily between meals for supplement   OMEPRAZOLE (PRILOSEC) 40 MG CAPSULE    Take 40 mg by mouth daily.   OXYCODONE HCL, ABUSE DETER, (OXAYDO) 5 MG TABA    Take 1-2 tablets by mouth every 4 (four) hours as needed.   PROPRANOLOL (INDERAL) 10 MG TABLET    Take 10 mg by mouth 2 (two) times daily.   TEMAZEPAM (RESTORIL) 30 MG CAPSULE    Take 30 mg by mouth at bedtime as needed for sleep.   VENLAFAXINE XR (EFFEXOR-XR) 150 MG 24 HR CAPSULE    Take 300 mg by mouth daily with breakfast.  Modified Medications   No medications on file  Discontinued Medications   CODEINE 60 MG TABLET    Take 60 mg by mouth every 6 (six) hours as needed for pain. Hold for sedation / confusion     SIGNIFICANT DIAGNOSTIC EXAMS   08-18-16: ct of cervical spine: 1. No evidence of acute cervical spine injury. 2. Soft tissue swelling around the partly visualized left clavicle, consider radiography.   08-18-16: left elbow x-ray: Curvilinear lucency through the coronoid process of the ulna, which may represent an avulsion fracture or an osteophyte. Please correlate to possible point tenderness.   08-18-16: left shoulder x-ray: Comminuted impacted intra-articular fracture of the left humeral head/surgical neck with mild posterior inferior subluxation, which may be due to intracapsular hematoma.   08-18-16: chest x-ray: No evidence of acute rib fracture.  LABS REVIEWED:   08-18-16: wbc 14.1; hgb 11.9; hct 37.3 mcv 95.4; plt 300; glucose 100; bun 28; creat 0.80; k+ 4.4; na++ 136; tsh 62.183 08-19-16: wbc 9.3; hgb 9.4; hct 30.4; mcv 96.8; plt 203; glucose 80; bun 20; creat 0.62; k+ 3.1; na++ 140; mag 1.7; phos 3.2; free T4: 0.34 08-20-16: wbc 8.2; hgb 9.5; hct 30.3; mcv 98.1; plt 225; glucose 88; bun 18; creat 0.54; k+ 3.9; na++ 139; ca 6.8; liver  normal albumin 2.5; mag 1.8; phos 2.1; tsh 46.443; free T4: 0.33  08-22-16: wbc 9.3; hgb 11.7; hct 36.4; mcv 96.1; plt 281    Review of Systems  Constitutional: Negative for malaise/fatigue.  Respiratory: Negative for cough and shortness of breath.   Cardiovascular: Negative for chest pain, palpitations and leg  swelling.  Gastrointestinal: Negative for abdominal pain, constipation and heartburn.  Musculoskeletal: Positive for joint pain. Negative for back pain and myalgias.       Left shoulder pain is controlled   Skin: Negative.   Neurological: Negative for dizziness.  Psychiatric/Behavioral: The patient has insomnia. The patient is not nervous/anxious.      Physical Exam  Constitutional: She is oriented to person, place, and time. No distress.  Eyes: Conjunctivae are normal.  Neck: Neck supple. No JVD present. No thyromegaly present.  Cardiovascular: Normal rate, regular rhythm and intact distal pulses.   Respiratory: Effort normal and breath sounds normal. No respiratory distress. She has no wheezes.  GI: Soft. Bowel sounds are normal. She exhibits no distension. There is no tenderness.  Musculoskeletal: She exhibits no edema.  Left arm in sling and ace wrap Has bruising present.  Able to move other extremities  Lymphadenopathy:    She has no cervical adenopathy.  Neurological: She is alert and oriented to person, place, and time.  Skin: Skin is warm and dry. She is not diaphoretic.  Psychiatric:  Is anxious     ASSESSMENT/ PLAN:  1. Left humeral fracture: she was able to sit up on the edge of bed for 17 minutes with assistance. She is willing to get out of bed in the AM.   Time spent with patient  45  minutes >50% time spent counseling; reviewing medical record; tests; labs; and developing future plan of care   MD is aware of resident's narcotic use and is in agreement with current plan of care. We will attempt to wean resident as apropriate   Ok Edwards  NP [facility] Adult [facility]  Contact [phone] Monday through Friday 8am- 5pm  After hours call [phone]

## 2016-09-01 LAB — TSH: TSH: 63.98 u[IU]/mL — AB (ref 0.41–5.90)

## 2016-09-09 ENCOUNTER — Encounter: Payer: Self-pay | Admitting: Adult Health

## 2016-09-09 ENCOUNTER — Non-Acute Institutional Stay (SKILLED_NURSING_FACILITY): Payer: Medicare Other | Admitting: Adult Health

## 2016-09-09 DIAGNOSIS — E038 Other specified hypothyroidism: Secondary | ICD-10-CM | POA: Diagnosis not present

## 2016-09-09 NOTE — Progress Notes (Signed)
Location:   [facility] Room Number: 116A Place of Service:  [facility]   CODE STATUS: Full Code  Allergies  Allergen Reactions  . Amoxicillin Anaphylaxis and Other (See Comments)    Has patient had a PCN reaction causing immediate rash, facial/tongue/throat swelling, SOB or lightheadedness with hypotension: Yes Has patient had a PCN reaction causing severe rash involving mucus membranes or skin necrosis: No Has patient had a PCN reaction that required hospitalization No Has patient had a PCN reaction occurring within the last 10 years: No If all of the above answers are "NO", then may proceed with Cephalosporin use.  Marland Kitchen Penicillins Anaphylaxis and Other (See Comments)    Has patient had a PCN reaction causing immediate rash, facial/tongue/throat swelling, SOB or lightheadedness with hypotension: Yes Has patient had a PCN reaction causing severe rash involving mucus membranes or skin necrosis: No Has patient had a PCN reaction that required hospitalization No Has patient had a PCN reaction occurring within the last 10 years: No If all of the above answers are "NO", then may proceed with Cephalosporin use.   Marland Kitchen Phenytoin Other (See Comments)    Reaction:  CNS disorder   . Dilaudid [Hydromorphone Hcl] Other (See Comments)    Reaction:  Psychosis   . Haldol [Haloperidol] Other (See Comments)    Reaction:  Psychosis   . Morphine And Related Other (See Comments)    Reaction:  Psychosis     Chief Complaint  Patient presents with  . Acute Visit    abnormal labs    HPI:  Her tsh is 63.98. She tells me that she is feeling good and has no complaints. She told me that she did participate in therapy.    Past Medical History:  Diagnosis Date  . Arm pain   . Chronic pain   . COPD (chronic obstructive pulmonary disease) ([place])   . Coronary artery disease   . Depression   . Dystonia   . Epilepsy ([place])   . High cholesterol   . Humerus fracture   . Hypothyroid   .  Leukemia ([place])   . Myocardial infarct ([place])   . Schizophrenia ([place])   . Seizure [facility])     Past Surgical History:  Procedure Laterality Date  . EXTERNAL EAR SURGERY    . HUMERUS FRACTURE SURGERY    . SHOULDER SURGERY      Social History   Social History  . Marital status: Single    Spouse name: N/A  . Number of children: N/A  . Years of education: N/A   Occupational History  . Not on file.   Social History Main Topics  . Smoking status: Current Every Day Smoker    Packs/day: 2.00    Years: 41.00    Types: Cigarettes  . Smokeless tobacco: Never Used  . Alcohol use No  . Drug use: No  . Sexual activity: No   Other Topics Concern  . Not on file   Social History Narrative   Admitted to [facility] 08/20/16   Single   Smokes daily   Alcohol none   No Advanced Directive.         History reviewed. No pertinent family history.    VITAL SIGNS BP 126/84   Pulse 84   Temp 98.4 F (36.9 C)   Resp 18   Ht 5\' 1"  (1.549 m)   Wt 183 lb (83 kg)   SpO2 98%   BMI 34.58 kg/m   Patient's Medications  New Prescriptions   No medications on file  Previous Medications   ARIPIPRAZOLE (ABILIFY) 20 MG TABLET    Take 1 tablet (20 mg total) by mouth daily.   ATORVASTATIN (LIPITOR) 20 MG TABLET    Take 20 mg by mouth at bedtime. Take one tablet daily for hyperlipidemia    BACLOFEN (LIORESAL) 10 MG TABLET    Take 10 mg by mouth 3 (three) times daily as needed for muscle spasms.   BENZTROPINE (COGENTIN) 2 MG TABLET    Take 2 mg by mouth 3 (three) times daily.   LEVOTHYROXINE (SYNTHROID, LEVOTHROID) 88 MCG TABLET    Take 88 mcg by mouth daily before breakfast.   LORAZEPAM (ATIVAN) 1 MG TABLET    Take 1 tablet (1 mg total) by mouth 2 (two) times daily as needed for anxiety.   NUTRITIONAL SUPPLEMENT LIQD    House 2.0 - Give 237 ml by mouth 2 times daily between meals for supplement   OMEPRAZOLE (PRILOSEC) 40 MG CAPSULE    Take 40 mg by mouth daily.   OXYCODONE HCL,  ABUSE DETER, (OXAYDO) 5 MG TABA    Take 1-2 tablets by mouth every 4 (four) hours as needed.   PROPRANOLOL (INDERAL) 10 MG TABLET    Take 10 mg by mouth 2 (two) times daily.   TEMAZEPAM (RESTORIL) 30 MG CAPSULE    Take 30 mg by mouth at bedtime as needed for sleep.   VENLAFAXINE XR (EFFEXOR-XR) 150 MG 24 HR CAPSULE    Take 300 mg by mouth daily with breakfast.  Modified Medications   No medications on file  Discontinued Medications   No medications on file     SIGNIFICANT DIAGNOSTIC EXAMS   08-18-16: ct of cervical spine: 1. No evidence of acute cervical spine injury. 2. Soft tissue swelling around the partly visualized left clavicle, consider radiography.   08-18-16: left elbow x-ray: Curvilinear lucency through the coronoid process of the ulna, which may represent an avulsion fracture or an osteophyte. Please correlate to possible point tenderness.   08-18-16: left shoulder x-ray: Comminuted impacted intra-articular fracture of the left humeral head/surgical neck with mild posterior inferior subluxation, which may be due to intracapsular hematoma.   08-18-16: chest x-ray: No evidence of acute rib fracture.  LABS REVIEWED:   08-18-16: wbc 14.1; hgb 11.9; hct 37.3 mcv 95.4; plt 300; glucose 100; bun 28; creat 0.80; k+ 4.4; na++ 136; tsh 62.183 08-19-16: wbc 9.3; hgb 9.4; hct 30.4; mcv 96.8; plt 203; glucose 80; bun 20; creat 0.62; k+ 3.1; na++ 140; mag 1.7; phos 3.2; free T4: 0.34 08-20-16: wbc 8.2; hgb 9.5; hct 30.3; mcv 98.1; plt 225; glucose 88; bun 18; creat 0.54; k+ 3.9; na++ 139; ca 6.8; liver normal albumin 2.5; mag 1.8; phos 2.1; tsh 46.443; free T4: 0.33  08-22-16: wbc 9.3; hgb 11.7; hct 36.4; mcv 96.1; plt 281 09-01-16: tsh 63.98   Review of Systems  Constitutional: Negative for malaise/fatigue.  Respiratory: Negative for cough and shortness of breath.   Cardiovascular: Negative for chest pain, palpitations and leg swelling.  Gastrointestinal: Negative for abdominal pain,  constipation and heartburn.  Musculoskeletal: Positive for joint pain. Negative for back pain and myalgias.       Left shoulder pain is controlled   Skin: Negative.   Neurological: Negative for dizziness.  Psychiatric/Behavioral: The patient has insomnia. The patient is not nervous/anxious.      Physical Exam  Constitutional: She is oriented to person, place, and time. No distress.  Eyes:  Conjunctivae are normal.  Neck: Neck supple. No JVD present. No thyromegaly present.  Cardiovascular: Normal rate, regular rhythm and intact distal pulses.   Respiratory: Effort normal and breath sounds normal. No respiratory distress. She has no wheezes.  GI: Soft. Bowel sounds are normal. She exhibits no distension. There is no tenderness.  Musculoskeletal: She exhibits no edema.  Left arm in sling and ace wrap Able to move other extremities  Lymphadenopathy:    She has no cervical adenopathy.  Neurological: She is alert and oriented to person, place, and time.  Skin: Skin is warm and dry. She is not diaphoretic.  Psychiatric:  Is less anxious      ASSESSMENT/ PLAN:  1. Hypothyroidism: tsh is 63.98: will increase her synthroid to 100 mcg daily and will monitor     MD is aware of resident's narcotic use and is in agreement with current plan of care. We will attempt to wean resident as apropriate   Ok Edwards NP [facility] Adult Medicine  Contact [phone] Monday through Friday 8am- 5pm  After hours call [phone]

## 2016-09-11 DIAGNOSIS — K219 Gastro-esophageal reflux disease without esophagitis: Secondary | ICD-10-CM | POA: Insufficient documentation

## 2016-09-12 ENCOUNTER — Other Ambulatory Visit: Payer: Self-pay | Admitting: *Deleted

## 2016-09-12 MED ORDER — ACETAMINOPHEN-CODEINE #3 300-30 MG PO TABS: ORAL_TABLET | ORAL | 0 refills | Status: DC

## 2016-09-12 NOTE — Telephone Encounter (Signed)
[facility] #[phone] Fax:[phone]  

## 2016-09-13 ENCOUNTER — Non-Acute Institutional Stay (SKILLED_NURSING_FACILITY): Payer: Medicare Other | Admitting: Adult Health

## 2016-09-13 ENCOUNTER — Encounter: Payer: Self-pay | Admitting: Adult Health

## 2016-09-13 DIAGNOSIS — S42355S Nondisplaced comminuted fracture of shaft of humerus, left arm, sequela: Secondary | ICD-10-CM | POA: Diagnosis not present

## 2016-09-13 DIAGNOSIS — G40909 Epilepsy, unspecified, not intractable, without status epilepticus: Secondary | ICD-10-CM | POA: Diagnosis not present

## 2016-09-13 DIAGNOSIS — I10 Essential (primary) hypertension: Secondary | ICD-10-CM

## 2016-09-13 DIAGNOSIS — F250 Schizoaffective disorder, bipolar type: Secondary | ICD-10-CM | POA: Diagnosis not present

## 2016-09-13 NOTE — Progress Notes (Signed)
Location:   [place] Room Number: 116 A Place of Service:  SNF (31)    CODE STATUS: Full Code  Allergies  Allergen Reactions  . Amoxicillin Anaphylaxis and Other (See Comments)    Has patient had a PCN reaction causing immediate rash, facial/tongue/throat swelling, SOB or lightheadedness with hypotension: Yes Has patient had a PCN reaction causing severe rash involving mucus membranes or skin necrosis: No Has patient had a PCN reaction that required hospitalization No Has patient had a PCN reaction occurring within the last 10 years: No If all of the above answers are "NO", then may proceed with Cephalosporin use.  Marland Kitchen Penicillins Anaphylaxis and Other (See Comments)    Has patient had a PCN reaction causing immediate rash, facial/tongue/throat swelling, SOB or lightheadedness with hypotension: Yes Has patient had a PCN reaction causing severe rash involving mucus membranes or skin necrosis: No Has patient had a PCN reaction that required hospitalization No Has patient had a PCN reaction occurring within the last 10 years: No If all of the above answers are "NO", then may proceed with Cephalosporin use.   Marland Kitchen Phenytoin Other (See Comments)    Reaction:  CNS disorder   . Dilaudid [Hydromorphone Hcl] Other (See Comments)    Reaction:  Psychosis   . Haldol [Haloperidol] Other (See Comments)    Reaction:  Psychosis   . Morphine And Related Other (See Comments)    Reaction:  Psychosis     Chief Complaint  Patient presents with  . Discharge Note    Discharging to home    HPI:  She is being discharged to home with home health for pt/ot/rn/cna/sw. She will need a standard wheelchair. She will need her prescriptions written and will need to follow up with her medical provider.  She had been hospitalized for a left shoulder fracture and was admitted to this facility for short term rehab.    Past Medical History:  Diagnosis Date  . Arm pain   . Chronic pain   . COPD  (chronic obstructive pulmonary disease) ([place])   . Coronary artery disease   . Depression   . Dystonia   . Epilepsy ([place])   . High cholesterol   . Humerus fracture   . Hypothyroid   . Leukemia ([place])   . Myocardial infarct ([place])   . Schizophrenia ([place])   . Seizure [facility])     Past Surgical History:  Procedure Laterality Date  . EXTERNAL EAR SURGERY    . HUMERUS FRACTURE SURGERY    . SHOULDER SURGERY      Social History   Social History  . Marital status: Single    Spouse name: N/A  . Number of children: N/A  . Years of education: N/A   Occupational History  . Not on file.   Social History Main Topics  . Smoking status: Current Every Day Smoker    Packs/day: 2.00    Years: 41.00    Types: Cigarettes  . Smokeless tobacco: Never Used  . Alcohol use No  . Drug use: No  . Sexual activity: No   Other Topics Concern  . Not on file   Social History Narrative   Admitted to [facility] 08/20/16   Single   Smokes daily   Alcohol none   No Advanced Directive.         History reviewed. No pertinent family history.  VITAL SIGNS BP 126/84   Pulse 84   Temp 98.4 F (36.9 C)  Resp 18   Ht 5\' 1"  (1.549 m)   Wt 183 lb (83 kg)   SpO2 98%   BMI 34.58 kg/m   Patient's Medications  New Prescriptions   No medications on file  Previous Medications   ARIPIPRAZOLE (ABILIFY) 20 MG TABLET    Take 1 tablet (20 mg total) by mouth daily.   ATORVASTATIN (LIPITOR) 20 MG TABLET    Take 20 mg by mouth at bedtime. Take one tablet daily for hyperlipidemia    BACLOFEN (LIORESAL) 10 MG TABLET    Take 10 mg by mouth 3 (three) times daily as needed for muscle spasms.   BENZTROPINE (COGENTIN) 2 MG TABLET    Take 2 mg by mouth 3 (three) times daily.   LEVOTHYROXINE (SYNTHROID, LEVOTHROID) 100 MCG TABLET    Take 100 mcg by mouth daily before breakfast.    LORAZEPAM (ATIVAN) 1 MG TABLET    Take 1 tablet (1 mg total) by mouth 2 (two) times daily as needed for anxiety.    NUTRITIONAL SUPPLEMENT LIQD    House 2.0 - Give 237 ml by mouth 2 times daily between meals for supplement   OMEPRAZOLE (PRILOSEC) 40 MG CAPSULE    Take 40 mg by mouth daily.   OXYCODONE HCL, ABUSE DETER, (OXAYDO) 5 MG TABA    Take 1-2 tablets by mouth every 4 (four) hours as needed.   PROPRANOLOL (INDERAL) 10 MG TABLET    Take 10 mg by mouth 2 (two) times daily.   TEMAZEPAM (RESTORIL) 30 MG CAPSULE    Take 30 mg by mouth at bedtime as needed for sleep.   VENLAFAXINE XR (EFFEXOR-XR) 150 MG 24 HR CAPSULE    Take 300 mg by mouth daily with breakfast.  Modified Medications   No medications on file  Discontinued Medications   ACETAMINOPHEN-CODEINE (TYLENOL #3) 300-30 MG TABLET    Take two tablets by mouth every 8 hours for severe pain     SIGNIFICANT DIAGNOSTIC EXAMS  08-18-16: ct of cervical spine: 1. No evidence of acute cervical spine injury. 2. Soft tissue swelling around the partly visualized left clavicle, consider radiography.   08-18-16: left elbow x-ray: Curvilinear lucency through the coronoid process of the ulna, which may represent an avulsion fracture or an osteophyte. Please correlate to possible point tenderness.   08-18-16: left shoulder x-ray: Comminuted impacted intra-articular fracture of the left humeral head/surgical neck with mild posterior inferior subluxation, which may be due to intracapsular hematoma.   08-18-16: chest x-ray: No evidence of acute rib fracture.  LABS REVIEWED:   08-18-16: wbc 14.1; hgb 11.9; hct 37.3 mcv 95.4; plt 300; glucose 100; bun 28; creat 0.80; k+ 4.4; na++ 136; tsh 62.183 08-19-16: wbc 9.3; hgb 9.4; hct 30.4; mcv 96.8; plt 203; glucose 80; bun 20; creat 0.62; k+ 3.1; na++ 140; mag 1.7; phos 3.2; free T4: 0.34 08-20-16: wbc 8.2; hgb 9.5; hct 30.3; mcv 98.1; plt 225; glucose 88; bun 18; creat 0.54; k+ 3.9; na++ 139; ca 6.8; liver normal albumin 2.5; mag 1.8; phos 2.1; tsh 46.443; free T4: 0.33  08-22-16: wbc 9.3; hgb 11.7; hct 36.4; mcv 96.1; plt  281 09-01-16: tsh 63.98   Review of Systems  Constitutional: Negative for malaise/fatigue.  Respiratory: Negative for cough and shortness of breath.   Cardiovascular: Negative for chest pain, palpitations and leg swelling.  Gastrointestinal: Negative for abdominal pain, constipation and heartburn.  Musculoskeletal: Positive for joint pain. Negative for back pain and myalgias.       Left  shoulder pain is controlled   Skin: Negative.   Neurological: Negative for dizziness.  Psychiatric/Behavioral:  The patient is not nervous/anxious.      Physical Exam  Constitutional: She is oriented to person, place, and time. No distress.  Eyes: Conjunctivae are normal.  Neck: Neck supple. No JVD present. No thyromegaly present.  Cardiovascular: Normal rate, regular rhythm and intact distal pulses.   Respiratory: Effort normal and breath sounds normal. No respiratory distress. She has no wheezes.  GI: Soft. Bowel sounds are normal. She exhibits no distension. There is no tenderness.  Musculoskeletal: She exhibits no edema.  Left arm in sling and ace wrap Able to move other extremities  Lymphadenopathy:    She has no cervical adenopathy.  Neurological: She is alert and oriented to person, place, and time.  Skin: Skin is warm and dry. She is not diaphoretic.  Psychiatric: not anxious       ASSESSMENT/ PLAN:  Patient is being discharged with the following home health services:  Pt/otrn/cna/sw to evaluate and treat as indicated for gait balance strength adl training medication management adl care and community outreach   Patient is being discharged with the following durable medical equipment:  Standard wheelchair with cushion leg rests anti-tipper brake extensions to allow her to maintain her current level of independence with her adls that cannot be achieved with a wheelchair is able to self propel   Patient has been advised to f/u with their PCP in 1-2 weeks to bring them up to date on their  rehab stay.  Social services at facility was responsible for arranging this appointment.  Pt was provided with a 30 day supply of prescriptions for medications and refills must be obtained from their PCP.  For controlled substances, a more limited supply may be provided adequate until PCP appointment only. #15 restoril 30 mg tabs #15 ativan 1 mg tabs #20 oxycodone 5 mg tabs   Time spent with patient 45   minutes >50% time spent counseling; reviewing medical record; tests; labs; and developing future plan of care   Ok Edwards NP [facility] Adult Medicine  Contact [phone] Monday through Friday 8am- 5pm  After hours call [phone]

## 2016-09-14 ENCOUNTER — Other Ambulatory Visit: Payer: Self-pay | Admitting: Adult Health

## 2016-10-10 ENCOUNTER — Encounter (HOSPITAL_COMMUNITY): Payer: Self-pay | Admitting: Emergency Medicine

## 2016-10-10 ENCOUNTER — Ambulatory Visit (HOSPITAL_COMMUNITY)
Admission: EM | Admit: 2016-10-10 | Discharge: 2016-10-10 | Disposition: A | Discharge status: Home / Self Care | Payer: Medicare Other | Attending: Physician Assistant | Admitting: Physician Assistant

## 2016-10-10 DIAGNOSIS — M25512 Pain in left shoulder: Secondary | ICD-10-CM | POA: Diagnosis not present

## 2016-10-10 MED ORDER — KETOROLAC TROMETHAMINE 30 MG/ML IJ SOLN
INTRAMUSCULAR | Status: AC
  Filled 2016-10-10: qty 1

## 2016-10-10 MED ORDER — KETOROLAC TROMETHAMINE 30 MG/ML IJ SOLN
30.0000 mg | Freq: Once | INTRAMUSCULAR | Status: AC
  Administered 2016-10-10: 30 mg via INTRAMUSCULAR

## 2016-10-10 MED ORDER — NAPROXEN 375 MG PO TABS: 375.0000 mg | ORAL_TABLET | Freq: Two times a day (BID) | ORAL | 0 refills | Status: DC

## 2016-10-10 NOTE — ED Triage Notes (Signed)
The patient presented to the [facility] with a complaint of left upper arm pain x 2 weeks.
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
## 2016-10-10 NOTE — Discharge Instructions (Signed)
Follow up with Dr Tamera Punt for evaluation. Take naproxen with food for pain. Monitor for any stomach upset. Use sling to prevent further injury to the arm.

## 2017-01-26 ENCOUNTER — Emergency Department (HOSPITAL_COMMUNITY)
Admission: EM | Admit: 2017-01-26 | Discharge: 2017-01-26 | Disposition: A | Discharge status: Home / Self Care | Payer: Medicare Other | Attending: Emergency Medicine | Admitting: Emergency Medicine

## 2017-01-26 ENCOUNTER — Encounter (HOSPITAL_COMMUNITY): Payer: Self-pay

## 2017-01-26 DIAGNOSIS — R112 Nausea with vomiting, unspecified: Secondary | ICD-10-CM

## 2017-01-26 DIAGNOSIS — R1084 Generalized abdominal pain: Secondary | ICD-10-CM | POA: Diagnosis not present

## 2017-01-26 DIAGNOSIS — E039 Hypothyroidism, unspecified: Secondary | ICD-10-CM | POA: Diagnosis not present

## 2017-01-26 DIAGNOSIS — I10 Essential (primary) hypertension: Secondary | ICD-10-CM | POA: Diagnosis not present

## 2017-01-26 DIAGNOSIS — Z79899 Other long term (current) drug therapy: Secondary | ICD-10-CM | POA: Insufficient documentation

## 2017-01-26 DIAGNOSIS — J449 Chronic obstructive pulmonary disease, unspecified: Secondary | ICD-10-CM | POA: Diagnosis not present

## 2017-01-26 DIAGNOSIS — R300 Dysuria: Secondary | ICD-10-CM | POA: Diagnosis not present

## 2017-01-26 DIAGNOSIS — Z87891 Personal history of nicotine dependence: Secondary | ICD-10-CM | POA: Insufficient documentation

## 2017-01-26 DIAGNOSIS — Z760 Encounter for issue of repeat prescription: Secondary | ICD-10-CM | POA: Insufficient documentation

## 2017-01-26 DIAGNOSIS — R197 Diarrhea, unspecified: Secondary | ICD-10-CM | POA: Diagnosis not present

## 2017-01-26 DIAGNOSIS — I2510 Atherosclerotic heart disease of native coronary artery without angina pectoris: Secondary | ICD-10-CM | POA: Diagnosis not present

## 2017-01-26 HISTORY — DX: Insomnia, unspecified: G47.00

## 2017-01-26 HISTORY — DX: Unspecified psychosis not due to a substance or known physiological condition: F29

## 2017-01-26 LAB — URINALYSIS, ROUTINE W REFLEX MICROSCOPIC
BACTERIA UA: NONE SEEN
Bilirubin Urine: NEGATIVE
Glucose, UA: NEGATIVE mg/dL
Hgb urine dipstick: NEGATIVE
Ketones, ur: 5 mg/dL — AB
Leukocytes, UA: NEGATIVE
NITRITE: NEGATIVE
PH: 5 (ref 5.0–8.0)
Protein, ur: 30 mg/dL — AB
SPECIFIC GRAVITY, URINE: 1.03 (ref 1.005–1.030)

## 2017-01-26 LAB — CBC
HEMATOCRIT: 48.4 % — AB (ref 36.0–46.0)
HEMOGLOBIN: 16.1 g/dL — AB (ref 12.0–15.0)
MCH: 30.9 pg (ref 26.0–34.0)
MCHC: 33.3 g/dL (ref 30.0–36.0)
MCV: 92.9 fL (ref 78.0–100.0)
Platelets: 308 10*3/uL (ref 150–400)
RBC: 5.21 MIL/uL — ABNORMAL HIGH (ref 3.87–5.11)
RDW: 15.1 % (ref 11.5–15.5)
WBC: 12 10*3/uL — ABNORMAL HIGH (ref 4.0–10.5)

## 2017-01-26 LAB — COMPREHENSIVE METABOLIC PANEL
ALBUMIN: 5 g/dL (ref 3.5–5.0)
ALT: 55 U/L — ABNORMAL HIGH (ref 14–54)
ANION GAP: 14 (ref 5–15)
AST: 66 U/L — AB (ref 15–41)
Alkaline Phosphatase: 100 U/L (ref 38–126)
BUN: 24 mg/dL — AB (ref 6–20)
CALCIUM: 10.6 mg/dL — AB (ref 8.9–10.3)
CO2: 27 mmol/L (ref 22–32)
Chloride: 93 mmol/L — ABNORMAL LOW (ref 101–111)
Creatinine, Ser: 1.26 mg/dL — ABNORMAL HIGH (ref 0.44–1.00)
GFR calc Af Amer: 50 mL/min — ABNORMAL LOW (ref >60)
GFR calc non Af Amer: 43 mL/min — ABNORMAL LOW (ref >60)
GLUCOSE: 98 mg/dL (ref 65–99)
Potassium: 4.2 mmol/L (ref 3.5–5.1)
Sodium: 134 mmol/L — ABNORMAL LOW (ref 135–145)
Total Bilirubin: 0.6 mg/dL (ref 0.3–1.2)
Total Protein: 9.1 g/dL — ABNORMAL HIGH (ref 6.5–8.1)

## 2017-01-26 LAB — LIPASE, BLOOD: Lipase: 25 U/L (ref 11–51)

## 2017-01-26 MED ORDER — METOCLOPRAMIDE HCL 5 MG/ML IJ SOLN
5.0000 mg | Freq: Once | INTRAMUSCULAR | Status: AC
  Administered 2017-01-26: 5 mg via INTRAVENOUS
  Filled 2017-01-26: qty 2

## 2017-01-26 MED ORDER — SODIUM CHLORIDE 0.9 % IV BOLUS (SEPSIS)
1000.0000 mL | Freq: Once | INTRAVENOUS | Status: AC
  Administered 2017-01-26: 1000 mL via INTRAVENOUS

## 2017-01-26 MED ORDER — DIPHENHYDRAMINE HCL 50 MG/ML IJ SOLN
25.0000 mg | Freq: Once | INTRAMUSCULAR | Status: AC
  Administered 2017-01-26: 25 mg via INTRAVENOUS
  Filled 2017-01-26: qty 1

## 2017-01-26 MED ORDER — BENZTROPINE MESYLATE 2 MG PO TABS: 2.0000 mg | ORAL_TABLET | Freq: Three times a day (TID) | ORAL | 0 refills | Status: AC

## 2017-01-26 MED ORDER — ONDANSETRON 4 MG PO TBDP
4.0000 mg | ORAL_TABLET | Freq: Once | ORAL | Status: AC | PRN
  Administered 2017-01-26: 4 mg via ORAL
  Filled 2017-01-26: qty 1

## 2017-01-26 MED ORDER — ONDANSETRON HCL 4 MG/2ML IJ SOLN
4.0000 mg | Freq: Once | INTRAMUSCULAR | Status: AC
  Administered 2017-01-26: 4 mg via INTRAVENOUS
  Filled 2017-01-26: qty 2

## 2017-01-26 MED ORDER — ONDANSETRON HCL 8 MG PO TABS: 8.0000 mg | ORAL_TABLET | Freq: Three times a day (TID) | ORAL | 0 refills | Status: DC | PRN

## 2017-01-26 NOTE — ED Notes (Signed)
Pt given ham sandwich. She was able to feed herself.

## 2017-01-26 NOTE — Discharge Instructions (Signed)
Avoid milk or foods containing milk such as cheese) while having diarrhea.Make sure that you drink at least six 8 ounce glasses of water or Gatorade each day in order to stay well-hydrated.  Take the medication prescribed as needed for nausea. See your primary care physician if not feeling better in 2 or 3 days.

## 2017-01-26 NOTE — ED Triage Notes (Signed)
Per EMS- Patient is from home. Patient c/o N/V/d x 5 days.

## 2017-01-26 NOTE — Care Management Note (Signed)
Case Management Note  CM consulted per pt request for medication assistance.  CM spoke with pt who states [facility] stopped delivering her medications to her home.  Advised her to call [facility] and ask them for who they recommend for mail order prescriptions.  Pt states she's not able to call them and that she doesn't have anyone who can help her until 9 am.  CM advised her that would still be appropriate.  No further CM needs noted at this time.

## 2017-01-26 NOTE — ED Notes (Signed)
Pt stated that [facility] no longer delivers so she needs to switch her delivery service. She was educated that she can call her insurance company to see who they recommend to do mail-delivery medications. Will also include this in d/c instructions. She reports that she has a Economist that will come to her house tomorrow.

## 2017-01-26 NOTE — ED Provider Notes (Signed)
[facility] DEPT Provider Note   CSN: [id number] Arrival date & time: 01/26/17  1478     History   Chief Complaint Chief Complaint  Patient presents with  . Nausea  . Emesis  . Diarrhea  . Abdominal Pain    HPI Dominique Acosta is a 66 y.o. female.developed nausea vomiting and diarrhea 5 days ago. Last vomited 2 days ago. Complains of nausea present. She reports 3 episodes of watery diarrhea this morning. She feels improved today over yesterday. She also complained of diffuse crampy abdominal pain which steadily improving. Other associated symptoms include burning on urination at urethral meatus for one month. She denies recent antibiotic use or recent travel. Denies abdominal pain at present. Nothing makes symptoms better or worse. No other associated symptoms  HPI  Past Medical History:  Diagnosis Date  . Arm pain   . Chronic pain   . COPD (chronic obstructive pulmonary disease) ([place])   . Coronary artery disease   . Depression   . Dystonia   . Epilepsy ([place])   . High cholesterol   . Humerus fracture   . Hypothyroid   . Insomnia   . Leukemia ([place])   . Myocardial infarct ([place])   . Psychosis ([place])   . Schizophrenia ([place])   . Seizure [facility])     Patient Active Problem List   Diagnosis Date Noted  . Gastroesophageal reflux disease without esophagitis 09/11/2016  . Insomnia 08/26/2016  . Seizure disorder ([place]) 08/26/2016  . Humerus fracture 08/18/2016  . Acute metabolic encephalopathy 29/56/2130  . Acute lower UTI 08/03/2016  . Hypokalemia 08/03/2016  . Leukocytosis 08/03/2016  . Benign essential HTN 08/03/2016  . Schizoaffective disorder, bipolar type ([place]) 07/08/2016  . Hypothyroidism 06/08/2006    Past Surgical History:  Procedure Laterality Date  . EXTERNAL EAR SURGERY    . HUMERUS FRACTURE SURGERY    . SHOULDER SURGERY      OB History    No data available       Home Medications    Prior to Admission medications     Medication Sig Start Date End Date Taking? Authorizing Provider  ARIPiprazole (ABILIFY) 20 MG tablet Take 1 tablet (20 mg total) by mouth daily. 08/07/16   Arrien, Jimmy Picket, MD  atorvastatin (LIPITOR) 20 MG tablet Take 20 mg by mouth at bedtime. Take one tablet daily for hyperlipidemia     [provider]  baclofen (LIORESAL) 10 MG tablet Take 10 mg by mouth 3 (three) times daily as needed for muscle spasms.    [provider]  benztropine (COGENTIN) 2 MG tablet Take 2 mg by mouth 3 (three) times daily.    [provider]  levothyroxine (SYNTHROID, LEVOTHROID) 100 MCG tablet Take 100 mcg by mouth daily before breakfast.     [provider]  LORazepam (ATIVAN) 1 MG tablet Take 1 tablet (1 mg total) by mouth 2 (two) times daily as needed for anxiety. 08/20/16   Raiford Noble Latif, DO  naproxen (NAPROSYN) 375 MG tablet Take 1 tablet (375 mg total) by mouth 2 (two) times daily. 10/10/16   Ok Edwards, PA-C  NUTRITIONAL SUPPLEMENT LIQD House 2.0 - Give 237 ml by mouth 2 times daily between meals for supplement    [provider]  omeprazole (PRILOSEC) 40 MG capsule Take 40 mg by mouth daily.    [provider]  OxyCODONE HCl, Abuse Deter, (OXAYDO) 5 MG TABA Take 1-2 tablets by mouth every 4 (four) hours  as needed.    [provider]  propranolol (INDERAL) 10 MG tablet Take 10 mg by mouth 2 (two) times daily.    [provider]  temazepam (RESTORIL) 30 MG capsule Take 30 mg by mouth at bedtime as needed for sleep.    [provider]  venlafaxine XR (EFFEXOR-XR) 150 MG 24 hr capsule Take 300 mg by mouth daily with breakfast.    [provider]    Family History Family History  Problem Relation Age of Onset  . Cancer Mother   . Cancer Father     Social History Social History  Substance Use Topics  . Smoking status: Former Smoker    Packs/day: 2.00    Years: 41.00    Types: Cigarettes  . Smokeless  tobacco: Never Used  . Alcohol use No     Allergies   Amoxicillin; Penicillins; Phenytoin; Dilaudid [hydromorphone hcl]; Haldol [haloperidol]; and Morphine and related   Review of Systems Review of Systems  Constitutional: Negative.   HENT: Negative.   Respiratory: Negative.   Cardiovascular: Negative.   Gastrointestinal: Positive for abdominal pain, diarrhea, nausea and vomiting.  Genitourinary: Positive for dysuria.  Musculoskeletal: Positive for arthralgias and gait problem.       Chronic left shoulder pain. Walks with cane or walker  Skin: Negative.   Psychiatric/Behavioral: Negative.      Physical Exam Updated Vital Signs BP (!) 119/55 (BP Location: Right Arm)   Pulse 82   Temp 97.7 F (36.5 C) (Oral)   Resp 18   Ht 5' (1.524 m)   Wt 77.1 kg (170 lb)   SpO2 98%   BMI 33.20 kg/m   Physical Exam  Constitutional: She is oriented to person, place, and time. She appears well-developed and well-nourished.  HENT:  Head: Normocephalic and atraumatic.  Mucous membranes dry  Eyes: Pupils are equal, round, and reactive to light. Conjunctivae are normal.  Neck: Neck supple. No tracheal deviation present. No thyromegaly present.  Cardiovascular: Normal rate and regular rhythm.   No murmur heard. Pulmonary/Chest: Effort normal and breath sounds normal.  Abdominal: Soft. Bowel sounds are normal. She exhibits no distension. There is no tenderness.  Musculoskeletal: Normal range of motion. She exhibits no edema or tenderness.  Neurological: She is alert and oriented to person, place, and time. No cranial nerve deficit. Coordination normal.  Lip smacking movements consistent with tardive dyskinesia  Skin: Skin is warm and dry. No rash noted.  Psychiatric: She has a normal mood and affect.  Nursing note and vitals reviewed.    ED Treatments / Results  Labs (all labs ordered are listed, but only abnormal results are displayed) Labs Reviewed  LIPASE, BLOOD  COMPREHENSIVE  METABOLIC PANEL  CBC  URINALYSIS, ROUTINE W REFLEX MICROSCOPIC    EKG  EKG Interpretation None       Radiology No results found.  Procedures Procedures (including critical care time)  Medications Ordered in ED Medications  sodium chloride 0.9 % bolus 1,000 mL (not administered)  metoCLOPramide (REGLAN) injection 5 mg (not administered)  ondansetron (ZOFRAN-ODT) disintegrating tablet 4 mg (4 mg Oral Given 01/26/17 1006)   Results for orders placed or performed during the hospital encounter of 01/26/17  Lipase, blood  Result Value Ref Range   Lipase 25 11 - 51 U/L  Comprehensive metabolic panel  Result Value Ref Range   Sodium 134 (L) 135 - 145 mmol/L   Potassium 4.2 3.5 - 5.1 mmol/L   Chloride 93 (L) 101 - 111  mmol/L   CO2 27 22 - 32 mmol/L   Glucose, Bld 98 65 - 99 mg/dL   BUN 24 (H) 6 - 20 mg/dL   Creatinine, Ser 1.26 (H) 0.44 - 1.00 mg/dL   Calcium 10.6 (H) 8.9 - 10.3 mg/dL   Total Protein 9.1 (H) 6.5 - 8.1 g/dL   Albumin 5.0 3.5 - 5.0 g/dL   AST 66 (H) 15 - 41 U/L   ALT 55 (H) 14 - 54 U/L   Alkaline Phosphatase 100 38 - 126 U/L   Total Bilirubin 0.6 0.3 - 1.2 mg/dL   GFR calc non Af Amer 43 (L) >60 mL/min   GFR calc Af Amer 50 (L) >60 mL/min   Anion gap 14 5 - 15  CBC  Result Value Ref Range   WBC 12.0 (H) 4.0 - 10.5 K/uL   RBC 5.21 (H) 3.87 - 5.11 MIL/uL   Hemoglobin 16.1 (H) 12.0 - 15.0 g/dL   HCT 48.4 (H) 36.0 - 46.0 %   MCV 92.9 78.0 - 100.0 fL   MCH 30.9 26.0 - 34.0 pg   MCHC 33.3 30.0 - 36.0 g/dL   RDW 15.1 11.5 - 15.5 %   Platelets 308 150 - 400 K/uL  Urinalysis, Routine w reflex microscopic  Result Value Ref Range   Color, Urine AMBER (A) YELLOW   APPearance HAZY (A) CLEAR   Specific Gravity, Urine 1.030 1.005 - 1.030   pH 5.0 5.0 - 8.0   Glucose, UA NEGATIVE NEGATIVE mg/dL   Hgb urine dipstick NEGATIVE NEGATIVE   Bilirubin Urine NEGATIVE NEGATIVE   Ketones, ur 5 (A) NEGATIVE mg/dL   Protein, ur 30 (A) NEGATIVE mg/dL   Nitrite NEGATIVE  NEGATIVE   Leukocytes, UA NEGATIVE NEGATIVE   RBC / HPF 0-5 0 - 5 RBC/hpf   WBC, UA 0-5 0 - 5 WBC/hpf   Bacteria, UA NONE SEEN NONE SEEN   Squamous Epithelial / LPF 0-5 (A) NONE SEEN   Mucus PRESENT    No results found.  Initial Impression / Assessment and Plan / ED Course  I have reviewed the triage vital signs and the nursing notes.  Pertinent labs & imaging results that were available during my care of the patient were reviewed by me and considered in my medical decision making (see chart for details).     4:05 PM feels much improved after treatment with intravenous hydration, and IV and the emetics. As well as intravenous Benadryl. She is presently hungry.. She gave further history that she has lost prescription for Cogentin as of recently. Plan I'll write a prescription for Cogentin and Zofran. She is able to walk with her walker without difficulty. She drank water without nausea or vomiting Avoid dairy encourage oral hydration Final Clinical Impressions(s) / ED Diagnoses  Diagnosis #1 nausea vomiting diarrhea #2 dysuria #3 prescription refill Final diagnoses:  None    New Prescriptions New Prescriptions   No medications on file     [name], MD 01/26/17 1626

## 2017-01-26 NOTE — ED Triage Notes (Signed)
Patient c/o mid abdominal pain, N/V/D x 5 days. Patient denies any blood in vomit or stool.

## 2017-01-26 NOTE — ED Notes (Signed)
[facility] called for transport.  

## 2017-01-26 NOTE — ED Notes (Signed)
Pt ambulated with walker around room.

## 2017-01-26 NOTE — ED Notes (Signed)
Bed: [facility] Expected date:  Expected time:  Means of arrival:  Comments: 

## 2017-01-26 NOTE — ED Notes (Signed)
Called x2

## 2017-01-26 NOTE — ED Notes (Signed)
Pt called for room assignment w/o answer x 1.

## 2017-02-22 ENCOUNTER — Emergency Department (HOSPITAL_COMMUNITY): Payer: Medicare Other

## 2017-02-22 ENCOUNTER — Inpatient Hospital Stay (HOSPITAL_COMMUNITY)
Admission: EM | Admit: 2017-02-22 | Discharge: 2017-03-17 | DRG: 166 | Disposition: A | Discharge status: Skilled Nursing Facility | Payer: Medicare Other | Attending: Internal Medicine | Admitting: Internal Medicine

## 2017-02-22 ENCOUNTER — Encounter (HOSPITAL_COMMUNITY): Payer: Self-pay

## 2017-02-22 ENCOUNTER — Inpatient Hospital Stay (HOSPITAL_COMMUNITY): Payer: Medicare Other

## 2017-02-22 DIAGNOSIS — E8770 Fluid overload, unspecified: Secondary | ICD-10-CM | POA: Diagnosis not present

## 2017-02-22 DIAGNOSIS — Z4659 Encounter for fitting and adjustment of other gastrointestinal appliance and device: Secondary | ICD-10-CM | POA: Diagnosis not present

## 2017-02-22 DIAGNOSIS — Z6835 Body mass index (BMI) 35.0-35.9, adult: Secondary | ICD-10-CM

## 2017-02-22 DIAGNOSIS — E038 Other specified hypothyroidism: Secondary | ICD-10-CM | POA: Diagnosis not present

## 2017-02-22 DIAGNOSIS — B9562 Methicillin resistant Staphylococcus aureus infection as the cause of diseases classified elsewhere: Secondary | ICD-10-CM | POA: Diagnosis present

## 2017-02-22 DIAGNOSIS — E7800 Pure hypercholesterolemia, unspecified: Secondary | ICD-10-CM | POA: Diagnosis present

## 2017-02-22 DIAGNOSIS — R0602 Shortness of breath: Secondary | ICD-10-CM

## 2017-02-22 DIAGNOSIS — R1311 Dysphagia, oral phase: Secondary | ICD-10-CM | POA: Diagnosis not present

## 2017-02-22 DIAGNOSIS — Z66 Do not resuscitate: Secondary | ICD-10-CM | POA: Diagnosis present

## 2017-02-22 DIAGNOSIS — G40009 Localization-related (focal) (partial) idiopathic epilepsy and epileptic syndromes with seizures of localized onset, not intractable, without status epilepticus: Secondary | ICD-10-CM | POA: Diagnosis not present

## 2017-02-22 DIAGNOSIS — N179 Acute kidney failure, unspecified: Secondary | ICD-10-CM | POA: Diagnosis not present

## 2017-02-22 DIAGNOSIS — J9690 Respiratory failure, unspecified, unspecified whether with hypoxia or hypercapnia: Secondary | ICD-10-CM

## 2017-02-22 DIAGNOSIS — F329 Major depressive disorder, single episode, unspecified: Secondary | ICD-10-CM | POA: Diagnosis present

## 2017-02-22 DIAGNOSIS — R1312 Dysphagia, oropharyngeal phase: Secondary | ICD-10-CM | POA: Diagnosis not present

## 2017-02-22 DIAGNOSIS — G8929 Other chronic pain: Secondary | ICD-10-CM | POA: Diagnosis present

## 2017-02-22 DIAGNOSIS — C9590 Leukemia, unspecified not having achieved remission: Secondary | ICD-10-CM | POA: Diagnosis present

## 2017-02-22 DIAGNOSIS — J386 Stenosis of larynx: Secondary | ICD-10-CM | POA: Diagnosis present

## 2017-02-22 DIAGNOSIS — J9691 Respiratory failure, unspecified with hypoxia: Secondary | ICD-10-CM | POA: Diagnosis not present

## 2017-02-22 DIAGNOSIS — E876 Hypokalemia: Secondary | ICD-10-CM | POA: Diagnosis present

## 2017-02-22 DIAGNOSIS — E669 Obesity, unspecified: Secondary | ICD-10-CM | POA: Diagnosis present

## 2017-02-22 DIAGNOSIS — E873 Alkalosis: Secondary | ICD-10-CM | POA: Diagnosis present

## 2017-02-22 DIAGNOSIS — J15212 Pneumonia due to Methicillin resistant Staphylococcus aureus: Secondary | ICD-10-CM

## 2017-02-22 DIAGNOSIS — G40909 Epilepsy, unspecified, not intractable, without status epilepticus: Secondary | ICD-10-CM

## 2017-02-22 DIAGNOSIS — E785 Hyperlipidemia, unspecified: Secondary | ICD-10-CM | POA: Diagnosis present

## 2017-02-22 DIAGNOSIS — G9340 Encephalopathy, unspecified: Secondary | ICD-10-CM | POA: Diagnosis present

## 2017-02-22 DIAGNOSIS — E2740 Unspecified adrenocortical insufficiency: Secondary | ICD-10-CM | POA: Diagnosis present

## 2017-02-22 DIAGNOSIS — I252 Old myocardial infarction: Secondary | ICD-10-CM | POA: Diagnosis not present

## 2017-02-22 DIAGNOSIS — R569 Unspecified convulsions: Secondary | ICD-10-CM | POA: Diagnosis not present

## 2017-02-22 DIAGNOSIS — R1310 Dysphagia, unspecified: Secondary | ICD-10-CM | POA: Diagnosis not present

## 2017-02-22 DIAGNOSIS — Z880 Allergy status to penicillin: Secondary | ICD-10-CM

## 2017-02-22 DIAGNOSIS — E039 Hypothyroidism, unspecified: Secondary | ICD-10-CM

## 2017-02-22 DIAGNOSIS — Z789 Other specified health status: Secondary | ICD-10-CM

## 2017-02-22 DIAGNOSIS — N170 Acute kidney failure with tubular necrosis: Secondary | ICD-10-CM | POA: Diagnosis present

## 2017-02-22 DIAGNOSIS — E035 Myxedema coma: Secondary | ICD-10-CM | POA: Diagnosis present

## 2017-02-22 DIAGNOSIS — J9601 Acute respiratory failure with hypoxia: Secondary | ICD-10-CM

## 2017-02-22 DIAGNOSIS — I2510 Atherosclerotic heart disease of native coronary artery without angina pectoris: Secondary | ICD-10-CM | POA: Diagnosis present

## 2017-02-22 DIAGNOSIS — G9341 Metabolic encephalopathy: Secondary | ICD-10-CM | POA: Diagnosis present

## 2017-02-22 DIAGNOSIS — F209 Schizophrenia, unspecified: Secondary | ICD-10-CM | POA: Diagnosis present

## 2017-02-22 DIAGNOSIS — J690 Pneumonitis due to inhalation of food and vomit: Secondary | ICD-10-CM | POA: Diagnosis present

## 2017-02-22 DIAGNOSIS — G2401 Drug induced subacute dyskinesia: Secondary | ICD-10-CM | POA: Diagnosis present

## 2017-02-22 DIAGNOSIS — J449 Chronic obstructive pulmonary disease, unspecified: Secondary | ICD-10-CM | POA: Diagnosis present

## 2017-02-22 DIAGNOSIS — R410 Disorientation, unspecified: Secondary | ICD-10-CM | POA: Diagnosis not present

## 2017-02-22 DIAGNOSIS — M810 Age-related osteoporosis without current pathological fracture: Secondary | ICD-10-CM | POA: Diagnosis present

## 2017-02-22 DIAGNOSIS — D649 Anemia, unspecified: Secondary | ICD-10-CM | POA: Diagnosis present

## 2017-02-22 DIAGNOSIS — F1910 Other psychoactive substance abuse, uncomplicated: Secondary | ICD-10-CM | POA: Diagnosis present

## 2017-02-22 DIAGNOSIS — G40001 Localization-related (focal) (partial) idiopathic epilepsy and epileptic syndromes with seizures of localized onset, not intractable, with status epilepticus: Secondary | ICD-10-CM | POA: Diagnosis not present

## 2017-02-22 DIAGNOSIS — Z9114 Patient's other noncompliance with medication regimen: Secondary | ICD-10-CM

## 2017-02-22 DIAGNOSIS — Z96611 Presence of right artificial shoulder joint: Secondary | ICD-10-CM | POA: Diagnosis present

## 2017-02-22 LAB — CBC WITH DIFFERENTIAL/PLATELET
BASOS PCT: 0 %
Basophils Absolute: 0 10*3/uL (ref 0.0–0.1)
EOS ABS: 0.1 10*3/uL (ref 0.0–0.7)
EOS PCT: 1 %
HCT: 35.9 % — ABNORMAL LOW (ref 36.0–46.0)
Hemoglobin: 11.7 g/dL — ABNORMAL LOW (ref 12.0–15.0)
Lymphocytes Relative: 11 %
Lymphs Abs: 1.1 10*3/uL (ref 0.7–4.0)
MCH: 31.5 pg (ref 26.0–34.0)
MCHC: 32.6 g/dL (ref 30.0–36.0)
MCV: 96.5 fL (ref 78.0–100.0)
MONO ABS: 0.6 10*3/uL (ref 0.1–1.0)
MONOS PCT: 6 %
Neutro Abs: 8.2 10*3/uL — ABNORMAL HIGH (ref 1.7–7.7)
Neutrophils Relative %: 82 %
PLATELETS: 261 10*3/uL (ref 150–400)
RBC: 3.72 MIL/uL — ABNORMAL LOW (ref 3.87–5.11)
RDW: 15.1 % (ref 11.5–15.5)
WBC: 10 10*3/uL (ref 4.0–10.5)

## 2017-02-22 LAB — URINALYSIS, ROUTINE W REFLEX MICROSCOPIC
BILIRUBIN URINE: NEGATIVE
Glucose, UA: NEGATIVE mg/dL
Ketones, ur: NEGATIVE mg/dL
LEUKOCYTES UA: NEGATIVE
Nitrite: NEGATIVE
PH: 6 (ref 5.0–8.0)
Protein, ur: NEGATIVE mg/dL
SPECIFIC GRAVITY, URINE: 1.005 (ref 1.005–1.030)

## 2017-02-22 LAB — RAPID URINE DRUG SCREEN, HOSP PERFORMED
AMPHETAMINES: NOT DETECTED
BENZODIAZEPINES: NOT DETECTED
Barbiturates: NOT DETECTED
COCAINE: NOT DETECTED
Opiates: NOT DETECTED
Tetrahydrocannabinol: NOT DETECTED

## 2017-02-22 LAB — COMPREHENSIVE METABOLIC PANEL
ALBUMIN: 3.1 g/dL — AB (ref 3.5–5.0)
ALK PHOS: 62 U/L (ref 38–126)
ALT: 28 U/L (ref 14–54)
ANION GAP: 6 (ref 5–15)
AST: 29 U/L (ref 15–41)
BILIRUBIN TOTAL: 1.2 mg/dL (ref 0.3–1.2)
BUN: 15 mg/dL (ref 6–20)
CALCIUM: 7 mg/dL — AB (ref 8.9–10.3)
CO2: 26 mmol/L (ref 22–32)
Chloride: 108 mmol/L (ref 101–111)
Creatinine, Ser: 1.68 mg/dL — ABNORMAL HIGH (ref 0.44–1.00)
GFR calc Af Amer: 35 mL/min — ABNORMAL LOW (ref >60)
GFR calc non Af Amer: 30 mL/min — ABNORMAL LOW (ref >60)
GLUCOSE: 127 mg/dL — AB (ref 65–99)
Potassium: 2.7 mmol/L — CL (ref 3.5–5.1)
SODIUM: 140 mmol/L (ref 135–145)
TOTAL PROTEIN: 5.5 g/dL — AB (ref 6.5–8.1)

## 2017-02-22 LAB — I-STAT VENOUS BLOOD GAS, ED
ACID-BASE EXCESS: 6 mmol/L — AB (ref 0.0–2.0)
Bicarbonate: 34.2 mmol/L — ABNORMAL HIGH (ref 20.0–28.0)
O2 SAT: 100 %
PH VEN: 7.343 (ref 7.250–7.430)
TCO2: 36 mmol/L — ABNORMAL HIGH (ref 22–32)
pCO2, Ven: 62.5 mmHg — ABNORMAL HIGH (ref 44.0–60.0)
pO2, Ven: 427 mmHg — ABNORMAL HIGH (ref 32.0–45.0)

## 2017-02-22 LAB — PROTIME-INR
INR: 1.07
Prothrombin Time: 13.8 seconds (ref 11.4–15.2)

## 2017-02-22 LAB — PHOSPHORUS: Phosphorus: 3 mg/dL (ref 2.5–4.6)

## 2017-02-22 LAB — SALICYLATE LEVEL

## 2017-02-22 LAB — GLUCOSE, CAPILLARY: GLUCOSE-CAPILLARY: 86 mg/dL (ref 65–99)

## 2017-02-22 LAB — CK: CK TOTAL: 174 U/L (ref 38–234)

## 2017-02-22 LAB — TRIGLYCERIDES: TRIGLYCERIDES: 264 mg/dL — AB (ref <150)

## 2017-02-22 LAB — MAGNESIUM: MAGNESIUM: 2.1 mg/dL (ref 1.7–2.4)

## 2017-02-22 LAB — ETHANOL: Alcohol, Ethyl (B): <10 mg/dL (ref <10)

## 2017-02-22 LAB — MRSA PCR SCREENING: MRSA BY PCR: POSITIVE — AB

## 2017-02-22 LAB — ACETAMINOPHEN LEVEL: Acetaminophen (Tylenol), Serum: <10 ug/mL — ABNORMAL LOW (ref 10–30)

## 2017-02-22 LAB — I-STAT CG4 LACTIC ACID, ED: LACTIC ACID, VENOUS: 0.73 mmol/L (ref 0.5–1.9)

## 2017-02-22 LAB — TROPONIN I

## 2017-02-22 LAB — APTT: aPTT: 28 seconds (ref 24–36)

## 2017-02-22 MED ORDER — SODIUM CHLORIDE 0.9 % IV BOLUS (SEPSIS)
1000.0000 mL | Freq: Once | INTRAVENOUS | Status: AC
  Administered 2017-02-22: 1000 mL via INTRAVENOUS

## 2017-02-22 MED ORDER — POTASSIUM CHLORIDE 10 MEQ/100ML IV SOLN
10.0000 meq | Freq: Once | INTRAVENOUS | Status: AC
  Administered 2017-02-22: 10 meq via INTRAVENOUS
  Filled 2017-02-22: qty 100

## 2017-02-22 MED ORDER — LORAZEPAM 2 MG/ML IJ SOLN
2.0000 mg | Freq: Once | INTRAMUSCULAR | Status: AC
  Administered 2017-02-22: 2 mg via INTRAVENOUS
  Filled 2017-02-22: qty 1

## 2017-02-22 MED ORDER — DOCUSATE SODIUM 50 MG/5ML PO LIQD: 100.0000 mg | Freq: Two times a day (BID) | ORAL | Status: DC | PRN

## 2017-02-22 MED ORDER — ASPIRIN 81 MG PO CHEW
324.0000 mg | CHEWABLE_TABLET | ORAL | Status: AC
  Administered 2017-02-22: 324 mg via ORAL
  Filled 2017-02-22: qty 4

## 2017-02-22 MED ORDER — LORAZEPAM 2 MG/ML IJ SOLN
2.0000 mg | INTRAMUSCULAR | Status: DC | PRN
  Administered 2017-02-28 – 2017-03-01 (×2): 2 mg via INTRAVENOUS
  Filled 2017-02-22 (×5): qty 1

## 2017-02-22 MED ORDER — PANTOPRAZOLE SODIUM 40 MG IV SOLR
40.0000 mg | Freq: Every day | INTRAVENOUS | Status: DC
  Administered 2017-02-22: 40 mg via INTRAVENOUS
  Filled 2017-02-22: qty 40

## 2017-02-22 MED ORDER — POTASSIUM CHLORIDE 10 MEQ/100ML IV SOLN
10.0000 meq | INTRAVENOUS | Status: AC
  Administered 2017-02-22 (×4): 10 meq via INTRAVENOUS
  Filled 2017-02-22 (×5): qty 100

## 2017-02-22 MED ORDER — VANCOMYCIN HCL 10 G IV SOLR
1250.0000 mg | INTRAVENOUS | Status: DC
  Administered 2017-02-23: 1250 mg via INTRAVENOUS
  Filled 2017-02-22: qty 1250

## 2017-02-22 MED ORDER — CHLORHEXIDINE GLUCONATE 0.12 % MT SOLN
15.0000 mL | Freq: Two times a day (BID) | OROMUCOSAL | Status: DC
  Administered 2017-02-22: 15 mL via OROMUCOSAL

## 2017-02-22 MED ORDER — DEXTROSE 5 % IV SOLN: 2.0000 g | Freq: Once | INTRAVENOUS | Status: DC

## 2017-02-22 MED ORDER — DEXTROSE 5 % IV SOLN
2.0000 g | Freq: Once | INTRAVENOUS | Status: AC
  Administered 2017-02-22: 2 g via INTRAVENOUS
  Filled 2017-02-22: qty 2

## 2017-02-22 MED ORDER — POTASSIUM CHLORIDE 20 MEQ/15ML (10%) PO SOLN
40.0000 meq | Freq: Once | ORAL | Status: AC
  Administered 2017-02-22: 40 meq
  Filled 2017-02-22: qty 30

## 2017-02-22 MED ORDER — AZTREONAM 1 G IJ SOLR
1.0000 g | Freq: Three times a day (TID) | INTRAMUSCULAR | Status: DC
  Administered 2017-02-22 – 2017-02-23 (×2): 1 g via INTRAVENOUS
  Filled 2017-02-22 (×5): qty 1

## 2017-02-22 MED ORDER — SODIUM CHLORIDE 0.9 % IV SOLN
0.0000 ug/min | INTRAVENOUS | Status: DC
  Administered 2017-02-22: 20 ug/min via INTRAVENOUS
  Administered 2017-02-22 – 2017-02-23 (×2): 60 ug/min via INTRAVENOUS
  Administered 2017-02-23: 80 ug/min via INTRAVENOUS
  Administered 2017-02-23: 50 ug/min via INTRAVENOUS
  Administered 2017-02-23 (×2): 60 ug/min via INTRAVENOUS
  Administered 2017-02-23: 85 ug/min via INTRAVENOUS
  Administered 2017-02-23 (×2): 80 ug/min via INTRAVENOUS
  Filled 2017-02-22 (×15): qty 1

## 2017-02-22 MED ORDER — LEVOFLOXACIN IN D5W 750 MG/150ML IV SOLN: 750.0000 mg | INTRAVENOUS | Status: DC

## 2017-02-22 MED ORDER — FENTANYL BOLUS VIA INFUSION
25.0000 ug | INTRAVENOUS | Status: DC | PRN
  Filled 2017-02-22: qty 25

## 2017-02-22 MED ORDER — HEPARIN SODIUM (PORCINE) 5000 UNIT/ML IJ SOLN
5000.0000 [IU] | Freq: Three times a day (TID) | INTRAMUSCULAR | Status: DC
  Administered 2017-02-22 – 2017-03-17 (×65): 5000 [IU] via SUBCUTANEOUS
  Filled 2017-02-22 (×67): qty 1

## 2017-02-22 MED ORDER — ASPIRIN 300 MG RE SUPP: 300.0000 mg | RECTAL | Status: AC

## 2017-02-22 MED ORDER — LEVOFLOXACIN IN D5W 750 MG/150ML IV SOLN
750.0000 mg | Freq: Once | INTRAVENOUS | Status: AC
  Administered 2017-02-22: 750 mg via INTRAVENOUS
  Filled 2017-02-22: qty 150

## 2017-02-22 MED ORDER — ORAL CARE MOUTH RINSE: 15.0000 mL | Freq: Two times a day (BID) | OROMUCOSAL | Status: DC

## 2017-02-22 MED ORDER — VANCOMYCIN HCL IN DEXTROSE 1-5 GM/200ML-% IV SOLN: 1000.0000 mg | Freq: Once | INTRAVENOUS | Status: DC

## 2017-02-22 MED ORDER — PROPOFOL 1000 MG/100ML IV EMUL: 0.0000 ug/kg/min | INTRAVENOUS | Status: DC

## 2017-02-22 MED ORDER — BISACODYL 10 MG RE SUPP: 10.0000 mg | Freq: Every day | RECTAL | Status: DC | PRN

## 2017-02-22 MED ORDER — FENTANYL 2500MCG IN NS 250ML (10MCG/ML) PREMIX INFUSION: 25.0000 ug/h | INTRAVENOUS | Status: DC

## 2017-02-22 MED ORDER — VALPROATE SODIUM 500 MG/5ML IV SOLN
500.0000 mg | Freq: Three times a day (TID) | INTRAVENOUS | Status: DC
  Administered 2017-02-22 – 2017-03-11 (×50): 500 mg via INTRAVENOUS
  Filled 2017-02-22 (×51): qty 5

## 2017-02-22 MED ORDER — SODIUM CHLORIDE 0.9 % IV BOLUS (SEPSIS)
2000.0000 mL | Freq: Once | INTRAVENOUS | Status: AC
  Administered 2017-02-22: 2000 mL via INTRAVENOUS

## 2017-02-22 MED ORDER — VALPROATE SODIUM 500 MG/5ML IV SOLN
1600.0000 mg | INTRAVENOUS | Status: AC
  Administered 2017-02-22: 1600 mg via INTRAVENOUS
  Filled 2017-02-22: qty 16

## 2017-02-22 MED ORDER — ETOMIDATE 2 MG/ML IV SOLN
INTRAVENOUS | Status: AC | PRN
  Administered 2017-02-22: 25 mg via INTRAVENOUS

## 2017-02-22 MED ORDER — CHLORHEXIDINE GLUCONATE 0.12% ORAL RINSE (MEDLINE KIT)
15.0000 mL | Freq: Two times a day (BID) | OROMUCOSAL | Status: DC
  Administered 2017-02-22 – 2017-03-04 (×21): 15 mL via OROMUCOSAL

## 2017-02-22 MED ORDER — SODIUM CHLORIDE 0.9 % IV SOLN
250.0000 mL | INTRAVENOUS | Status: DC | PRN
  Administered 2017-02-22: 250 mL via INTRAVENOUS
  Administered 2017-02-22: 10 mL via INTRAVENOUS

## 2017-02-22 MED ORDER — CHLORHEXIDINE GLUCONATE CLOTH 2 % EX PADS
6.0000 | MEDICATED_PAD | Freq: Every day | CUTANEOUS | Status: AC
  Administered 2017-02-23 – 2017-02-27 (×5): 6 via TOPICAL

## 2017-02-22 MED ORDER — IPRATROPIUM-ALBUTEROL 0.5-2.5 (3) MG/3ML IN SOLN: 3.0000 mL | RESPIRATORY_TRACT | Status: DC | PRN

## 2017-02-22 MED ORDER — SUCCINYLCHOLINE CHLORIDE 20 MG/ML IJ SOLN
INTRAMUSCULAR | Status: AC | PRN
  Administered 2017-02-22: 130 mg via INTRAVENOUS

## 2017-02-22 MED ORDER — MUPIROCIN 2 % EX OINT
1.0000 "application " | TOPICAL_OINTMENT | Freq: Two times a day (BID) | CUTANEOUS | Status: AC
  Administered 2017-02-22 – 2017-02-27 (×10): 1 via NASAL
  Filled 2017-02-22: qty 22

## 2017-02-22 MED ORDER — VANCOMYCIN HCL 10 G IV SOLR
2000.0000 mg | Freq: Once | INTRAVENOUS | Status: AC
  Administered 2017-02-22: 2000 mg via INTRAVENOUS
  Filled 2017-02-22: qty 2000

## 2017-02-22 MED ORDER — FENTANYL CITRATE (PF) 100 MCG/2ML IJ SOLN
50.0000 ug | INTRAMUSCULAR | Status: DC | PRN
  Filled 2017-02-22: qty 2

## 2017-02-22 MED ORDER — ORAL CARE MOUTH RINSE
15.0000 mL | Freq: Four times a day (QID) | OROMUCOSAL | Status: DC
  Administered 2017-02-22 – 2017-03-05 (×42): 15 mL via OROMUCOSAL

## 2017-02-22 NOTE — Progress Notes (Signed)
vLTM EEG running. Neuro notified °

## 2017-02-22 NOTE — Progress Notes (Signed)
Pharmacy Antibiotic Note  Robecca Fulgham is a 66 y.o. female admitted on 02/22/2017 with sepsis.  Pharmacy has been consulted for vancomycin/aztreonam/levofloxacin dosing. Noted hx of anaphylaxis to PCN - no history of receiving beta lactams here. SCr 1.68 on admit, CrCl~31.  Plan: Vancomycin 2g IV x 1; then 1250mg  IV q24h Aztreonam 2g IV x 1; then 1g IV q8h Levaquin 750mg  IV x 1; then 750mg  IV q48h Monitor clinical progress, c/s, renal function F/u de-escalation plan/LOT, vancomycin trough as indicated   Height: 5\' 1"  (154.9 cm) Weight: 180 lb (81.6 kg) IBW/kg (Calculated) : 47.8  Temp (24hrs), Avg:97.2 F (36.2 C), Min:97.2 F (36.2 C), Max:97.2 F (36.2 C)  No results for input(s): WBC, CREATININE, LATICACIDVEN, VANCOTROUGH, VANCOPEAK, VANCORANDOM, GENTTROUGH, GENTPEAK, GENTRANDOM, TOBRATROUGH, TOBRAPEAK, TOBRARND, AMIKACINPEAK, AMIKACINTROU, AMIKACIN in the last 168 hours.  CrCl cannot be calculated (Patient's most recent lab result is older than the maximum 21 days allowed.).    Allergies  Allergen Reactions  . Amoxicillin Anaphylaxis and Other (See Comments)    Childhood allergy Has patient had a PCN reaction causing immediate rash, facial/tongue/throat swelling, SOB or lightheadedness with hypotension: Yes Has patient had a PCN reaction causing severe rash involving mucus membranes or skin necrosis: No Has patient had a PCN reaction that required hospitalization No Has patient had a PCN reaction occurring within the last 10 years: No If all of the above answers are "NO", then may proceed with Cephalosporin use.  Marland Kitchen Keflex [Cephalexin] Diarrhea  . Penicillins Anaphylaxis and Other (See Comments)    Childhood allergy Has patient had a PCN reaction causing immediate rash, facial/tongue/throat swelling, SOB or lightheadedness with hypotension: Yes Has patient had a PCN reaction causing severe rash involving mucus membranes or skin necrosis: No Has patient had a PCN reaction that  required hospitalization No Has patient had a PCN reaction occurring within the last 10 years: No If all of the above answers are "NO", then may proceed with Cephalosporin use.   Marland Kitchen Phenytoin Other (See Comments)    Reaction:  CNS disorder   . Dilaudid [Hydromorphone Hcl] Other (See Comments)    Reaction:  Psychosis   . Haldol [Haloperidol] Other (See Comments)    Reaction:  Psychosis   . Morphine And Related Other (See Comments)    Reaction:  Psychosis     [name], PharmD, BCPS Clinical Pharmacist 02/22/2017 2:35 PM

## 2017-02-22 NOTE — ED Triage Notes (Signed)
Patient arrived by [facility] from home after being found by caretaker unresponsive at home. Per EMS patient had pinpoint pupils and narcan was administered with no change in neuro status. Patient skin cold to touch and nasal trumpet in place pta. Dried emesis to mouth and chin with strong odor of urine present.

## 2017-02-22 NOTE — Consult Note (Signed)
NEURO HOSPITALIST CONSULT NOTE   Requestig physician: Dr. Jimmey Ralph   Reason for Consult: possible seizure   History obtained from:   Chart     HPI:                                                                                                                                          Dominique Acosta is an 66 y.o. female with history of seizure? Schizophrenia, psychosis, depression, leukemia who arrived by [facility] from home after being found by caretaker unresponsive at home. Per EMS patient had pinpoint pupils and narcan was administered with no change in neuro status. Respiratory rate from 4-8 without improvement of mental status thus was intubated. After intubation she was noted to have a decerebrate posturing and frothing around ET tube. 2 of Ativan were given and neurology was called along with EEG. Currently no clinical seizure noted but she is still under effects of paralytic. EEG currently being hooked up.    Past Medical History:  Diagnosis Date  . Arm pain   . Chronic pain   . COPD (chronic obstructive pulmonary disease) ([place])   . Coronary artery disease   . Depression   . Dystonia   . Epilepsy ([place])   . High cholesterol   . Humerus fracture   . Hypothyroid   . Insomnia   . Leukemia ([place])   . Myocardial infarct ([place])   . Psychosis ([place])   . Schizophrenia ([place])   . Seizure [facility])     Past Surgical History:  Procedure Laterality Date  . EXTERNAL EAR SURGERY    . HUMERUS FRACTURE SURGERY    . SHOULDER SURGERY      Family History  Problem Relation Age of Onset  . Cancer Mother   . Cancer Father      Social History:  reports that she has quit smoking. Her smoking use included cigarettes. She has a 82.00 pack-year smoking history. she has never used smokeless tobacco. She reports that she does not drink alcohol or use drugs.  Allergies  Allergen Reactions  . Amoxicillin Anaphylaxis and Other (See Comments)    Childhood allergy Has patient had a  PCN reaction causing immediate rash, facial/tongue/throat swelling, SOB or lightheadedness with hypotension: Yes Has patient had a PCN reaction causing severe rash involving mucus membranes or skin necrosis: No Has patient had a PCN reaction that required hospitalization No Has patient had a PCN reaction occurring within the last 10 years: No If all of the above answers are "NO", then may proceed with Cephalosporin use.  Marland Kitchen Keflex [Cephalexin] Diarrhea  . Penicillins Anaphylaxis and Other (See Comments)    Childhood allergy Has patient had a PCN reaction causing immediate rash, facial/tongue/throat swelling, SOB or lightheadedness with hypotension: Yes Has patient had  a PCN reaction causing severe rash involving mucus membranes or skin necrosis: No Has patient had a PCN reaction that required hospitalization No Has patient had a PCN reaction occurring within the last 10 years: No If all of the above answers are "NO", then may proceed with Cephalosporin use.   Marland Kitchen Phenytoin Other (See Comments)    Reaction:  CNS disorder   . Dilaudid [Hydromorphone Hcl] Other (See Comments)    Reaction:  Psychosis   . Haldol [Haloperidol] Other (See Comments)    Reaction:  Psychosis   . Morphine And Related Other (See Comments)    Reaction:  Psychosis     MEDICATIONS:                                                                                                                     Current Facility-Administered Medications  Medication Dose Route Frequency Provider Last Rate Last Dose  . 0.9 %  sodium chloride infusion  250 mL Intravenous PRN Jennelle Human B, NP      . aspirin chewable tablet 324 mg  324 mg Oral NOW Jennelle Human B, NP       Or  . aspirin suppository 300 mg  300 mg Rectal NOW Jennelle Human B, NP      . aztreonam (AZACTAM) 1 g in dextrose 5 % 50 mL IVPB  1 g Intravenous Q8H Baird, Haley P, RPH      . aztreonam (AZACTAM) 2 g in dextrose 5 % 50 mL IVPB  2 g Intravenous Once [name], MD      . bisacodyl (DULCOLAX) suppository 10 mg  10 mg Rectal Daily PRN Arnell Asal, NP      . docusate (COLACE) 50 MG/5ML liquid 100 mg  100 mg Per Tube BID PRN Jennelle Human B, NP      . fentaNYL (SUBLIMAZE) injection 50 mcg  50 mcg Intravenous Q2H PRN Jennelle Human B, NP      . heparin injection 5,000 Units  5,000 Units Subcutaneous Q8H Jennelle Human B, NP      . ipratropium-albuterol (DUONEB) 0.5-2.5 (3) MG/3ML nebulizer solution 3 mL  3 mL Nebulization Q4H PRN Arnell Asal, NP      . Derrill Memo ON 02/24/2017] levofloxacin (LEVAQUIN) IVPB 750 mg  750 mg Intravenous Q48H Romona Curls, RPH      . LORazepam (ATIVAN) injection 2 mg  2 mg Intravenous Once Jennelle Human B, NP      . pantoprazole (PROTONIX) injection 40 mg  40 mg Intravenous QHS Jennelle Human B, NP      . potassium chloride 10 mEq in 100 mL IVPB  10 mEq Intravenous Once [name], MD 100 mL/hr at 02/22/17 1523 10 mEq at 02/22/17 1523  . propofol (DIPRIVAN) 1000 MG/100ML infusion  0-30 mcg/kg/min Intravenous Continuous Arnell Asal, NP      . Derrill Memo ON 02/23/2017] vancomycin (VANCOCIN) 1,250 mg in sodium chloride 0.9 % 250 mL IVPB  1,250 mg Intravenous Q24H  Romona Curls, [facility]       Current Outpatient Medications  Medication Sig Dispense Refill  . ADVAIR DISKUS 250-50 MCG/DOSE AEPB Take 1 puff by mouth daily.    . ARIPiprazole (ABILIFY) 20 MG tablet Take 1 tablet (20 mg total) by mouth daily. 15 tablet 0  . atorvastatin (LIPITOR) 20 MG tablet Take 20 mg by mouth at bedtime. Take one tablet daily for hyperlipidemia     . baclofen (LIORESAL) 10 MG tablet Take 10 mg by mouth 3 (three) times daily as needed for muscle spasms.    . benztropine (COGENTIN) 2 MG tablet Take 1 tablet (2 mg total) by mouth 3 (three) times daily. 90 tablet 0  . gabapentin (NEURONTIN) 800 MG tablet Take 800 mg by mouth daily.    Marland Kitchen LORazepam (ATIVAN) 1 MG tablet Take 1 tablet (1 mg total) by mouth 2 (two) times daily as needed for  anxiety. 10 tablet 0  . naproxen (NAPROSYN) 375 MG tablet Take 1 tablet (375 mg total) by mouth 2 (two) times daily. (Patient not taking: Reported on 01/26/2017) 20 tablet 0  . ondansetron (ZOFRAN) 8 MG tablet Take 1 tablet (8 mg total) by mouth every 8 (eight) hours as needed for nausea or vomiting. 12 tablet 0  . temazepam (RESTORIL) 30 MG capsule Take 30 mg by mouth at bedtime as needed for sleep.    . Tetrahydrozoline HCl (VISINE OP) Place 2 drops into both eyes 2 (two) times daily as needed (stye).    Marland Kitchen venlafaxine XR (EFFEXOR-XR) 150 MG 24 hr capsule Take 300 mg by mouth daily with breakfast.        ROS:                                                                                                                                       History obtained from unobtainable from patient due to mental status    Blood pressure (!) 89/58, pulse 64, temperature (!) 94.6 F (34.8 C), resp. rate 15, height 5\' 1"  (1.549 m), weight 81.6 kg (180 lb), SpO2 99 %.   Neurologic Examination:                                                                                                      HEENT-  Normocephalic, no lesions, without obvious abnormality.  Normal external eye and conjunctiva.  Normal TM's bilaterally.  Normal auditory canals and external ears. Normal external nose, mucus membranes and septum.  Normal pharynx.  Cardiovascular- S1, S2 normal, pulses palpable throughout   Lungs- chest clear, no wheezing, rales, normal symmetric air entry Abdomen- normal findings: bowel sounds normal Extremities- no edema Lymph-no adenopathy palpable Musculoskeletal-no joint tenderness, deformity or swelling Skin-warm and dry, no hyperpigmentation, vitiligo, or suspicious lesions  Neurological Examination Mental Status: Patient is intubated, currently withdrawing from stimuli, breathing over the vent. Cranial Nerves: II: no blink to threat III,IV, VI:  intact bilaterally pupils equal, round, reactive  to light and accommodation V,VII: face symmetric, facial light touch sensation normal bilaterally VIII: could not testst IX,X: could not testst XI: could not testst XII: could not testst Motor: Localizing to pain,  Sensory:withdraws from pain Deep Tendon Reflexes: 2+ and symmetric throughout Plantars: Right: downgoing   Left: downgoing Cerebellar: Unable to assess Gait: not tested      Lab Results: Basic Metabolic Panel: Recent Labs  Lab 02/22/17 1256  NA 140  K 2.7*  CL 108  CO2 26  GLUCOSE 127*  BUN 15  CREATININE 1.68*  CALCIUM 7.0*    Liver Function Tests: Recent Labs  Lab 02/22/17 1256  AST 29  ALT 28  ALKPHOS 62  BILITOT 1.2  PROT 5.5*  ALBUMIN 3.1*   No results for input(s): LIPASE, AMYLASE in the last 168 hours. No results for input(s): AMMONIA in the last 168 hours.  CBC: Recent Labs  Lab 02/22/17 1256  WBC 10.0  NEUTROABS 8.2*  HGB 11.7*  HCT 35.9*  MCV 96.5  PLT 261    Cardiac Enzymes: Recent Labs  Lab 02/22/17 1308  CKTOTAL 174    Lipid Panel: No results for input(s): CHOL, TRIG, HDL, CHOLHDL, VLDL, LDLCALC in the last 168 hours.  CBG: No results for input(s): GLUCAP in the last 168 hours.  Microbiology: Results for orders placed or performed during the hospital encounter of 08/03/16  Urine culture     Status: None   Collection Time: 08/03/16  5:10 AM  Result Value Ref Range Status   Specimen Description URINE, RANDOM  Final   Special Requests NONE  Final   Culture   Final    NO GROWTH Performed at [facility], [place] [address], [place], [place] [address]    Report Status 08/04/2016 FINAL  Final    Coagulation Studies: No results for input(s): LABPROT, INR in the last 72 hours.  Imaging: Ct Head Wo Contrast  Result Date: 02/22/2017 CLINICAL DATA:  Patient found unresponsive this morning. EXAM: CT HEAD WITHOUT CONTRAST TECHNIQUE: Contiguous axial images were obtained from the base of the skull through the  vertex without intravenous contrast. COMPARISON:  07/28/2016 FINDINGS: Brain: No evidence of acute infarction, hemorrhage, hydrocephalus, extra-axial collection or mass lesion/mass effect. Moderate brain parenchymal volume loss and microangiopathy. Vascular: Calcific atherosclerotic disease at the skullbase. Skull: Normal. Negative for fracture or focal lesion. Sinuses/Orbits: Partial opacification of the ethmoid air cells. The remainder of the visualized paranasal sinuses and mastoid air cells are normally aerated. Other: None. IMPRESSION: No acute intracranial abnormality. Atrophy, chronic microvascular disease. Ethmoid sinusitis. Electronically Signed   By: Fidela Salisbury M.D.   On: 02/22/2017 13:55   Dg Chest Port 1 View  Addendum Date: 02/22/2017   ADDENDUM REPORT: 02/22/2017 11:25 ADDENDUM: These results were called by telephone on 02/22/2017 at 11:15 am to Dr. [name] , who verbally acknowledged these results. Electronically Signed   By: Titus Dubin M.D.   On: 02/22/2017 11:25   Result Date: 02/22/2017 CLINICAL DATA:  Check endotracheal tube and OG  tube placement. EXAM: PORTABLE CHEST 1 VIEW COMPARISON:  Chest x-ray dated Aug 18, 2016. FINDINGS: Endotracheal tube with the tip in the proximal right mainstem bronchus. Enteric tube seen entering the stomach, with the tip beyond the field of view. The cardiomediastinal silhouette is normal in size. Mild pulmonary vascular congestion. No focal consolidation, pleural effusion, or pneumothorax. No acute osseous abnormality. Prior reverse right shoulder arthroplasty. IMPRESSION: 1. Endotracheal tube with the tip in the proximal right mainstem bronchus. Recommend retraction 5 cm. 2. Enteric tube seen entering the stomach, with the distal side port in the gastric body. 3. Mild pulmonary vascular congestion. These results will be called to the ordering clinician or representative by the Radiologist Assistant, and communication documented in the  PACS or zVision Dashboard. Electronically Signed: By: Titus Dubin M.D. On: 02/22/2017 11:10   Dg Abd Portable 1 View  Result Date: 02/22/2017 CLINICAL DATA:  Assess positioning of orogastric tube. EXAM: PORTABLE ABDOMEN - 1 VIEW COMPARISON:  No recent studies in [facility] FINDINGS: The proximal port of the orogastric tube lies at the level of the GE junction with the tip in the proximal body. There is a moderate amount of gas within colon and some within small bowel. No free extraluminal collections are observed. External pacemaker defibrillator pads are present. IMPRESSION: The proximal port of the orogastric tube lies just at the GE junction and the tip is in the proximal gastric body. Advancement by 5-10 cm is recommended to assure that the proximal port remains below the GE junction. Electronically Signed   By: David  [place] M.D.   On: 02/22/2017 11:08       Assessment and plan per attending neurologist  Etta Quill PA-C [facility] [phone]  02/22/2017, 3:49 PM   Assessment/Plan: 66 year old female with a history of seizures who was found down unresponsive.  EEG shows a burst suppression pattern.  I do wonder if she had some type of arrest or hypoxia at home.  That would suggest that the extensor posturing that was seen earlier may not have been seizure but rather posturing.  1) Depacon 500 mg 3 times daily afterload 2) LTM EEG 3) repeat imaging tomorrow  Roland Rack, MD [facility] [phone]  If 7pm- 7am, please page neurology on call as listed in [place].

## 2017-02-22 NOTE — H&P (Signed)
PULMONARY / CRITICAL CARE MEDICINE   Name: Dominique Acosta MRN: [id number] DOB: 08-Jun-1950    ADMISSION DATE:  02/22/2017  REFERRING MD:  EDP  CHIEF COMPLAINT:  AMS  HISTORY OF PRESENT ILLNESS:   66yo female with hx COPD, CAD, MI, schizophrenia, dystonia, and seizures presented 11/14 after pushing the button on her life alert.  On EMS arrival she was unresponsive with respiratory rate of 4, no improvement with Narcan.  She was also noted to be significantly hypothermic.  She was intubated on arrival to ER.  ER workup included CT head which was negative for acute abnormalities, K 2.7, mild AKI with creatinine 1.68, lactate WNL, likely baseline hypercarbia, no acute changes on chest x-ray, UA and UDS negative.  PCCM called for ICU admission.  Patient's sister at bedside and states that she hasnt spoken to her in almost 6 months, but her other sister who lives in [place] recently has and patient seemed fined.  Unknown LKN.  Sister reports patient has an extensive psych history and history of abusing her medications.  Patient lives alone and has caregiver come during the day.    PAST MEDICAL HISTORY :  She  has a past medical history of Arm pain, Chronic pain, COPD (chronic obstructive pulmonary disease) ([place]), Coronary artery disease, Depression, Dystonia, Epilepsy ([place]), High cholesterol, Humerus fracture, Hypothyroid, Insomnia, Leukemia ([place]), Myocardial infarct ([place]), Psychosis ([place]), Schizophrenia ([place]), and Seizure ([place]).  PAST SURGICAL HISTORY: She  has a past surgical history that includes Shoulder surgery; Humerus fracture surgery; and External ear surgery.  Allergies  Allergen Reactions  . Amoxicillin Anaphylaxis and Other (See Comments)    Childhood allergy Has patient had a PCN reaction causing immediate rash, facial/tongue/throat swelling, SOB or lightheadedness with hypotension: Yes Has patient had a PCN reaction causing severe rash involving mucus membranes or skin necrosis:  No Has patient had a PCN reaction that required hospitalization No Has patient had a PCN reaction occurring within the last 10 years: No If all of the above answers are "NO", then may proceed with Cephalosporin use.  Marland Kitchen Keflex [Cephalexin] Diarrhea  . Penicillins Anaphylaxis and Other (See Comments)    Childhood allergy Has patient had a PCN reaction causing immediate rash, facial/tongue/throat swelling, SOB or lightheadedness with hypotension: Yes Has patient had a PCN reaction causing severe rash involving mucus membranes or skin necrosis: No Has patient had a PCN reaction that required hospitalization No Has patient had a PCN reaction occurring within the last 10 years: No If all of the above answers are "NO", then may proceed with Cephalosporin use.   Marland Kitchen Phenytoin Other (See Comments)    Reaction:  CNS disorder   . Dilaudid [Hydromorphone Hcl] Other (See Comments)    Reaction:  Psychosis   . Haldol [Haloperidol] Other (See Comments)    Reaction:  Psychosis   . Morphine And Related Other (See Comments)    Reaction:  Psychosis     No current facility-administered medications on file prior to encounter.    Current Outpatient Medications on File Prior to Encounter  Medication Sig  . ADVAIR DISKUS 250-50 MCG/DOSE AEPB Take 1 puff by mouth daily.  . ARIPiprazole (ABILIFY) 20 MG tablet Take 1 tablet (20 mg total) by mouth daily.  Marland Kitchen atorvastatin (LIPITOR) 20 MG tablet Take 20 mg by mouth at bedtime. Take one tablet daily for hyperlipidemia   . baclofen (LIORESAL) 10 MG tablet Take 10 mg by mouth 3 (three) times daily as needed for muscle spasms.  Marland Kitchen  benztropine (COGENTIN) 2 MG tablet Take 1 tablet (2 mg total) by mouth 3 (three) times daily.  Marland Kitchen gabapentin (NEURONTIN) 800 MG tablet Take 800 mg by mouth daily.  Marland Kitchen LORazepam (ATIVAN) 1 MG tablet Take 1 tablet (1 mg total) by mouth 2 (two) times daily as needed for anxiety.  . naproxen (NAPROSYN) 375 MG tablet Take 1 tablet (375 mg total) by  mouth 2 (two) times daily. (Patient not taking: Reported on 01/26/2017)  . ondansetron (ZOFRAN) 8 MG tablet Take 1 tablet (8 mg total) by mouth every 8 (eight) hours as needed for nausea or vomiting.  . temazepam (RESTORIL) 30 MG capsule Take 30 mg by mouth at bedtime as needed for sleep.  . Tetrahydrozoline HCl (VISINE OP) Place 2 drops into both eyes 2 (two) times daily as needed (stye).  Marland Kitchen venlafaxine XR (EFFEXOR-XR) 150 MG 24 hr capsule Take 300 mg by mouth daily with breakfast.    FAMILY HISTORY:  Her indicated that her mother is deceased. She indicated that her father is deceased.   SOCIAL HISTORY: She  reports that she has quit smoking. Her smoking use included cigarettes. She has a 82.00 pack-year smoking history. she has never used smokeless tobacco. She reports that she does not drink alcohol or use drugs.  REVIEW OF SYSTEMS:   Unable.  As per HPI above obtained from records and ER staff.  SUBJECTIVE:    VITAL SIGNS: BP 110/67   Pulse 64   Temp (!) 93.9 F (34.4 C)   Resp 16   Ht 5\' 1"  (1.549 m)   Wt 81.6 kg (180 lb)   SpO2 96%   BMI 34.01 kg/m   HEMODYNAMICS:    VENTILATOR SETTINGS: Vent Mode: PRVC FiO2 (%):  [60 %-100 %] 60 % Set Rate:  [14 bmp] 14 bmp Vt Set:  [420 mL] 420 mL PEEP:  [5 cmH20] 5 cmH20 Plateau Pressure:  [20 cmH20] 20 cmH20  INTAKE / OUTPUT: No intake/output data recorded.  PHYSICAL EXAMINATION: General:  Critically ill appearing female lying on ER stretcher on MV and bairhugger HEENT: dried emesis on face, ETT/ OGT, pupils 4/reactive, + corneals Neuro:  Unresponsive to pain.  Patient has rhythmic movements in all extremities, does not follow commands  CV: rrr, unable to assess heart sounds over rhonchus BS, cool distal extremities PULM: even/non-labored on MV, occasionally breathing over vent, lungs bilaterally rhonchi with copious amounts of thick yellow purulent sputum out of ETT GI: obese, soft, bs Extremities: warm/dry, no edema   Skin: no rashes or bruising noted   LABS:  BMET Recent Labs  Lab 02/22/17 1256  NA 140  K 2.7*  CL 108  CO2 26  BUN 15  CREATININE 1.68*  GLUCOSE 127*    Electrolytes Recent Labs  Lab 02/22/17 1256  CALCIUM 7.0*    CBC Recent Labs  Lab 02/22/17 1256  WBC 10.0  HGB 11.7*  HCT 35.9*  PLT 261    Coag's No results for input(s): APTT, INR in the last 168 hours.  Sepsis Markers Recent Labs  Lab 02/22/17 1314  LATICACIDVEN 0.73    ABG No results for input(s): PHART, PCO2ART, PO2ART in the last 168 hours.  Liver Enzymes Recent Labs  Lab 02/22/17 1256  AST 29  ALT 28  ALKPHOS 62  BILITOT 1.2  ALBUMIN 3.1*    Cardiac Enzymes No results for input(s): TROPONINI, PROBNP in the last 168 hours.  Glucose No results for input(s): GLUCAP in the last 168 hours.  Imaging Ct Head Wo Contrast  Result Date: 02/22/2017 CLINICAL DATA:  Patient found unresponsive this morning. EXAM: CT HEAD WITHOUT CONTRAST TECHNIQUE: Contiguous axial images were obtained from the base of the skull through the vertex without intravenous contrast. COMPARISON:  07/28/2016 FINDINGS: Brain: No evidence of acute infarction, hemorrhage, hydrocephalus, extra-axial collection or mass lesion/mass effect. Moderate brain parenchymal volume loss and microangiopathy. Vascular: Calcific atherosclerotic disease at the skullbase. Skull: Normal. Negative for fracture or focal lesion. Sinuses/Orbits: Partial opacification of the ethmoid air cells. The remainder of the visualized paranasal sinuses and mastoid air cells are normally aerated. Other: None. IMPRESSION: No acute intracranial abnormality. Atrophy, chronic microvascular disease. Ethmoid sinusitis. Electronically Signed   By: Fidela Salisbury M.D.   On: 02/22/2017 13:55   Dg Chest Port 1 View  Addendum Date: 02/22/2017   ADDENDUM REPORT: 02/22/2017 11:25 ADDENDUM: These results were called by telephone on 02/22/2017 at 11:15 am to Dr. [name] , who verbally acknowledged these results. Electronically Signed   By: Titus Dubin M.D.   On: 02/22/2017 11:25   Result Date: 02/22/2017 CLINICAL DATA:  Check endotracheal tube and OG tube placement. EXAM: PORTABLE CHEST 1 VIEW COMPARISON:  Chest x-ray dated Aug 18, 2016. FINDINGS: Endotracheal tube with the tip in the proximal right mainstem bronchus. Enteric tube seen entering the stomach, with the tip beyond the field of view. The cardiomediastinal silhouette is normal in size. Mild pulmonary vascular congestion. No focal consolidation, pleural effusion, or pneumothorax. No acute osseous abnormality. Prior reverse right shoulder arthroplasty. IMPRESSION: 1. Endotracheal tube with the tip in the proximal right mainstem bronchus. Recommend retraction 5 cm. 2. Enteric tube seen entering the stomach, with the distal side port in the gastric body. 3. Mild pulmonary vascular congestion. These results will be called to the ordering clinician or representative by the Radiologist Assistant, and communication documented in the PACS or zVision Dashboard. Electronically Signed: By: Titus Dubin M.D. On: 02/22/2017 11:10   Dg Abd Portable 1 View  Result Date: 02/22/2017 CLINICAL DATA:  Assess positioning of orogastric tube. EXAM: PORTABLE ABDOMEN - 1 VIEW COMPARISON:  No recent studies in [facility] FINDINGS: The proximal port of the orogastric tube lies at the level of the GE junction with the tip in the proximal body. There is a moderate amount of gas within colon and some within small bowel. No free extraluminal collections are observed. External pacemaker defibrillator pads are present. IMPRESSION: The proximal port of the orogastric tube lies just at the GE junction and the tip is in the proximal gastric body. Advancement by 5-10 cm is recommended to assure that the proximal port remains below the GE junction. Electronically Signed   By: David  Martinique M.D.   On: 02/22/2017 11:08   STUDIES:  CT head  11/14>>> No acute intracranial abnormality.   Atrophy, chronic microvascular disease.  Ethmoid sinusitis. EEG 11/14 >>  CULTURES: BC x 2 11/14>>> Urine 11/14>>> Trach asp 11/14 >>  ANTIBIOTICS: vanc 11/14>>> Zosyn 11/14>>> Aztreonam 11/14>>>  SIGNIFICANT EVENTS: 11/14 Admit  LINES/TUBES: ETT 11/14>>> PIV x 2   DISCUSSION: 66 year old female with history of COPD, CAD, schizophrenia presented 11/14 with altered mental status after hitting her life alert button at home.  She was intubated for airway protection in the ER.  Workup has been nonrevealing thus far.  ASSESSMENT / PLAN:  PULMONARY Acute respiratory failure COPD ? Tobacco abuse P:   Vent support - 8cc/kg  F/u CXR  F/u ABG  VAP prevention Sputum for  culture Check urine strep and legionella Duonebs q 4 prn  Empiric antibiotics as above SBT in a.m. if meets criteria  CARDIOVASCULAR History of CAD SIRS P:  Trend troponin Trend lactate, pro-calcitonin Repeat EKG now Gentle volume-received her 30cc/kg in ER  RENAL AKI-mild.   Hypokalemia P:   UA negative Gentle volume as above Follow-up chem Replete K Check phosphorus/ mag   GASTROINTESTINAL No active issue P:   N.p.o. PPI Bowel regimen with PAD protocol  HEMATOLOGIC No active issue P:  Subcu heparin CBC  INFECTIOUS SIRS- purulent secretions from ETT otherwise,  Lactate normal, no leukocytosis.  UA pending at the time of admission.  She did have significant hypothermia P:   Follow-up lactate, follow-up pro calcitonin Pan culture Empiric antibiotics as above Penicillin allergy noted Urine for strep and legionella  ENDOCRINE No active issue P:   Monitor glucose on chemistries  NEUROLOGIC Acute encephalopathy - ddx possible infection vs toxic/metabolic (overdose) vs status/ non-convulsive status with ? Seizure hx however meds to not support this. - CT scan does not reveal acute changes. -  Possible myoclonic movements f/b  episode of vomiting with brief decerebrate posturing f/b flapping of extended arms and legs, ? Possible seizure vs pseudoseizure given psych hx - no way to clarify or account for meds  History of schizophrenia, dystonia, seizures P:   Drug screen negative ETOH, tylenol, and salicylate pending Ativan 2mg  IV now Stat EEG Neurology consulted and appreciate assistance  PAD with propofol and prn fentanyl  Defer AEDs to Neurology  FAMILY  - Updates: Patient has two sisters.   [name] is currently at the bedside, however she has limited transportation to hospital.  Can be reached at [phone].  States patient is not married nor has children.  Second sister, Pamala Hurry, lives in [place].  [name] states that patient has verbalized to her before that she would not want life-prolonging care if it would reduce her already low quality of life with being disabled.  Prognosis is guarded.  Will continue current therapies however if patient was to decompensate and suffer cardiac arrest, her sister doesn't want CPR or other aggressive measures provided.  Therefore code status changed to DNR.     - Inter-disciplinary family meet or Palliative Care meeting due by: 11/21  CCT 60 mins  Kennieth Rad, AGACNP-BC [place] Pgr: [phone] or if no answer [phone] 02/22/2017, 4:09 PM

## 2017-02-22 NOTE — ED Provider Notes (Addendum)
[facility] EMERGENCY DEPARTMENT Provider Note   CSN: [id number] Arrival date & time: 02/22/17  1013     History   Chief Complaint No chief complaint on file.  Level 5 caveat.  Patient unresponsive.  History is obtained from paramedics.  I attempted to call phone number on patient's chart for further history.  No answer HPI Dominique Acosta is a 66 y.o. female.  HPI EMS was called as patient's life alert button was pressed.  She was found unresponsive with respiratory rate of 4 and pulse oximetry on room air and 80s..  EMS administered Narcan which took respiratory rate from 4-8 without improvement of mental status.  She was also treated with oxygen nonrebreather mask and nasal trumpet inserted. Past Medical History:  Diagnosis Date  . Arm pain   . Chronic pain   . COPD (chronic obstructive pulmonary disease) ([place])   . Coronary artery disease   . Depression   . Dystonia   . Epilepsy ([place])   . High cholesterol   . Humerus fracture   . Hypothyroid   . Insomnia   . Leukemia ([place])   . Myocardial infarct ([place])   . Psychosis ([place])   . Schizophrenia ([place])   . Seizure [facility])     Patient Active Problem List   Diagnosis Date Noted  . Gastroesophageal reflux disease without esophagitis 09/11/2016  . Insomnia 08/26/2016  . Seizure disorder ([place]) 08/26/2016  . Humerus fracture 08/18/2016  . Acute metabolic encephalopathy 11/94/1740  . Acute lower UTI 08/03/2016  . Hypokalemia 08/03/2016  . Leukocytosis 08/03/2016  . Benign essential HTN 08/03/2016  . Schizoaffective disorder, bipolar type ([place]) 07/08/2016  . Hypothyroidism 06/08/2006    Past Surgical History:  Procedure Laterality Date  . EXTERNAL EAR SURGERY    . HUMERUS FRACTURE SURGERY    . SHOULDER SURGERY      OB History    No data available       Home Medications    Prior to Admission medications   Medication Sig Start Date End Date Taking? Authorizing Provider  ADVAIR DISKUS 250-50 MCG/DOSE  AEPB Take 1 puff by mouth daily. 01/12/17   [provider]  ARIPiprazole (ABILIFY) 20 MG tablet Take 1 tablet (20 mg total) by mouth daily. 08/07/16   Arrien, Jimmy Picket, MD  atorvastatin (LIPITOR) 20 MG tablet Take 20 mg by mouth at bedtime. Take one tablet daily for hyperlipidemia     [provider]  baclofen (LIORESAL) 10 MG tablet Take 10 mg by mouth 3 (three) times daily as needed for muscle spasms.    [provider]  benztropine (COGENTIN) 2 MG tablet Take 1 tablet (2 mg total) by mouth 3 (three) times daily. 01/26/17   [name], MD  gabapentin (NEURONTIN) 800 MG tablet Take 800 mg by mouth daily. 12/07/16   [provider]  LORazepam (ATIVAN) 1 MG tablet Take 1 tablet (1 mg total) by mouth 2 (two) times daily as needed for anxiety. 08/20/16   Raiford Noble Latif, DO  naproxen (NAPROSYN) 375 MG tablet Take 1 tablet (375 mg total) by mouth 2 (two) times daily. Patient not taking: Reported on 01/26/2017 10/10/16   Ok Edwards, PA-C  ondansetron (ZOFRAN) 8 MG tablet Take 1 tablet (8 mg total) by mouth every 8 (eight) hours as needed for nausea or vomiting. 01/26/17   [name], MD  temazepam (RESTORIL) 30 MG capsule Take 30 mg by mouth at bedtime as needed for sleep.  [provider]  Tetrahydrozoline HCl (VISINE OP) Place 2 drops into both eyes 2 (two) times daily as needed (stye).    [provider]  venlafaxine XR (EFFEXOR-XR) 150 MG 24 hr capsule Take 300 mg by mouth daily with breakfast.    [provider]    Family History Family History  Problem Relation Age of Onset  . Cancer Mother   . Cancer Father     Social History Social History   Tobacco Use  . Smoking status: Former Smoker    Packs/day: 2.00    Years: 41.00    Pack years: 82.00    Types: Cigarettes  . Smokeless tobacco: Never Used  Substance Use Topics  . Alcohol use: No  . Drug use: No     Allergies   Amoxicillin; Keflex  [cephalexin]; Penicillins; Phenytoin; Dilaudid [hydromorphone hcl]; Haldol [haloperidol]; and Morphine and related   Review of Systems Review of Systems  Unable to perform ROS: Mental status change     Physical Exam Updated Vital Signs BP (!) 82/43   Pulse (!) 52   Temp (!) 97.2 F (36.2 C) (Oral)   Resp (!) 8   Ht _0  (1.549 m)   Wt 81.6 kg (180 lb)   SpO2 100%   BMI 34.01 kg/m   Physical Exam  Constitutional:  Acutely and chronically ill-appearing.  HENT:  Head: Normocephalic and atraumatic.  Mucous membranes dry, dried vomitus around lips.  Edentulous  Eyes: Conjunctivae are normal. Pupils are equal, round, and reactive to light.  Neck: Neck supple. No tracheal deviation present. No thyromegaly present.  Cardiovascular: Normal rate and regular rhythm.  No murmur heard. Pulmonary/Chest:  bradypneic, respiratory rate 8 diffuse rhonchi  Abdominal: Soft. Bowel sounds are normal. She exhibits no distension. There is no tenderness.  Obese  Musculoskeletal: Normal range of motion. She exhibits no edema or tenderness.  Neurological: Coordination normal.  Response to noxious stimulus with minimal nonpurposeful movement.  Does not open eyes to noxious stimulus.  Gag reflex absent.  DTRs symmetric bilaterally at knee jerk and ankle jerk and biceps toes downward going bilaterally.  Moves all extremities to noxious stimulus  Skin: Skin is warm and dry. No rash noted.  Psychiatric: She has a normal mood and affect.  Nursing note and vitals reviewed.    ED Treatments / Results  Labs (all labs ordered are listed, but only abnormal results are displayed) Labs Reviewed  CBC WITH DIFFERENTIAL/PLATELET  COMPREHENSIVE METABOLIC PANEL  URINALYSIS, ROUTINE W REFLEX MICROSCOPIC  RAPID URINE DRUG SCREEN, HOSP PERFORMED  BLOOD GAS, ARTERIAL  ACETAMINOPHEN LEVEL  SALICYLATE LEVEL  I-STAT CG4 LACTIC ACID, ED    EKG  EKG Interpretation  Date/Time:  Wednesday February 22 2017  11:20:48 EST Ventricular Rate:  53 PR Interval:    QRS Duration: 129 QT Interval:  548 QTC Calculation: 515 R Axis:   72 Text Interpretation:  Sinus rhythm Borderline prolonged PR interval Nonspecific intraventricular conduction delay Since last tracing rate slower Confirmed by [name] [phone]) on 02/22/2017 2:42:18 PM       Radiology No results found.  Procedures Procedure Name: Intubation Date/Time: 02/22/2017 10:00 AM Performed by: [name], MD Pre-anesthesia Checklist: Patient identified, Emergency Drugs available, Suction available, Patient being monitored and Timeout performed Oxygen Delivery Method: Ambu bag Preoxygenation: Pre-oxygenation with 100% oxygen Induction Type: IV induction Ventilation: Mask ventilation without difficulty Laryngoscope Size: Mac and 3 Grade View: Grade II Tube type: Subglottic suction tube Tube size: 7.5 mm Number  of attempts: 1 Placement Confirmation: ETT inserted through vocal cords under direct vision,  CO2 detector and Breath sounds checked- equal and bilateral Tube secured with: ETT holder Dental Injury: Teeth and Oropharynx as per pre-operative assessment       (including critical care time)  Medications Ordered in ED Medications  sodium chloride 0.9 % bolus 2,000 mL (not administered)  sodium chloride 0.9 % bolus 1,000 mL (not administered)  etomidate (AMIDATE) injection (25 mg Intravenous Given 02/22/17 1031)  succinylcholine (ANECTINE) injection (130 mg Intravenous Given 02/22/17 1032)   Results for orders placed or performed during the hospital encounter of 02/22/17  CBC with Differential/Platelet  Result Value Ref Range   WBC 10.0 4.0 - 10.5 K/uL   RBC 3.72 (L) 3.87 - 5.11 MIL/uL   Hemoglobin 11.7 (L) 12.0 - 15.0 g/dL   HCT 35.9 (L) 36.0 - 46.0 %   MCV 96.5 78.0 - 100.0 fL   MCH 31.5 26.0 - 34.0 pg   MCHC 32.6 30.0 - 36.0 g/dL   RDW 15.1 11.5 - 15.5 %   Platelets 261 150 - 400 K/uL   Neutrophils  Relative % 82 %   Neutro Abs 8.2 (H) 1.7 - 7.7 K/uL   Lymphocytes Relative 11 %   Lymphs Abs 1.1 0.7 - 4.0 K/uL   Monocytes Relative 6 %   Monocytes Absolute 0.6 0.1 - 1.0 K/uL   Eosinophils Relative 1 %   Eosinophils Absolute 0.1 0.0 - 0.7 K/uL   Basophils Relative 0 %   Basophils Absolute 0.0 0.0 - 0.1 K/uL  Comprehensive metabolic panel  Result Value Ref Range   Sodium 140 135 - 145 mmol/L   Potassium 2.7 (LL) 3.5 - 5.1 mmol/L   Chloride 108 101 - 111 mmol/L   CO2 26 22 - 32 mmol/L   Glucose, Bld 127 (H) 65 - 99 mg/dL   BUN 15 6 - 20 mg/dL   Creatinine, Ser 1.68 (H) 0.44 - 1.00 mg/dL   Calcium 7.0 (L) 8.9 - 10.3 mg/dL   Total Protein 5.5 (L) 6.5 - 8.1 g/dL   Albumin 3.1 (L) 3.5 - 5.0 g/dL   AST 29 15 - 41 U/L   ALT 28 14 - 54 U/L   Alkaline Phosphatase 62 38 - 126 U/L   Total Bilirubin 1.2 0.3 - 1.2 mg/dL   GFR calc non Af Amer 30 (L) >60 mL/min   GFR calc Af Amer 35 (L) >60 mL/min   Anion gap 6 5 - 15  CK  Result Value Ref Range   Total CK 174 38 - 234 U/L  I-Stat CG4 Lactic Acid, ED  Result Value Ref Range   Lactic Acid, Venous 0.73 0.5 - 1.9 mmol/L  I-Stat venous blood gas, ED  Result Value Ref Range   pH, Ven 7.343 7.250 - 7.430   pCO2, Ven 62.5 (H) 44.0 - 60.0 mmHg   pO2, Ven 427.0 (H) 32.0 - 45.0 mmHg   Bicarbonate 34.2 (H) 20.0 - 28.0 mmol/L   TCO2 36 (H) 22 - 32 mmol/L   O2 Saturation 100.0 %   Acid-Base Excess 6.0 (H) 0.0 - 2.0 mmol/L   Patient temperature 97.2 F    Collection site RADIAL, ALLEN'S TEST ACCEPTABLE    Drawn by Operator    Sample type VENOUS    Ct Head Wo Contrast  Result Date: 02/22/2017 CLINICAL DATA:  Patient found unresponsive this morning. EXAM: CT HEAD WITHOUT CONTRAST TECHNIQUE: Contiguous axial images were obtained from the base  of the skull through the vertex without intravenous contrast. COMPARISON:  07/28/2016 FINDINGS: Brain: No evidence of acute infarction, hemorrhage, hydrocephalus, extra-axial collection or mass lesion/mass  effect. Moderate brain parenchymal volume loss and microangiopathy. Vascular: Calcific atherosclerotic disease at the skullbase. Skull: Normal. Negative for fracture or focal lesion. Sinuses/Orbits: Partial opacification of the ethmoid air cells. The remainder of the visualized paranasal sinuses and mastoid air cells are normally aerated. Other: None. IMPRESSION: No acute intracranial abnormality. Atrophy, chronic microvascular disease. Ethmoid sinusitis. Electronically Signed   By: Fidela Salisbury M.D.   On: 02/22/2017 13:55   Dg Chest Port 1 View  Addendum Date: 02/22/2017   ADDENDUM REPORT: 02/22/2017 11:25 ADDENDUM: These results were called by telephone on 02/22/2017 at 11:15 am to Dr. [name] , who verbally acknowledged these results. Electronically Signed   By: Titus Dubin M.D.   On: 02/22/2017 11:25   Result Date: 02/22/2017 CLINICAL DATA:  Check endotracheal tube and OG tube placement. EXAM: PORTABLE CHEST 1 VIEW COMPARISON:  Chest x-ray dated Aug 18, 2016. FINDINGS: Endotracheal tube with the tip in the proximal right mainstem bronchus. Enteric tube seen entering the stomach, with the tip beyond the field of view. The cardiomediastinal silhouette is normal in size. Mild pulmonary vascular congestion. No focal consolidation, pleural effusion, or pneumothorax. No acute osseous abnormality. Prior reverse right shoulder arthroplasty. IMPRESSION: 1. Endotracheal tube with the tip in the proximal right mainstem bronchus. Recommend retraction 5 cm. 2. Enteric tube seen entering the stomach, with the distal side port in the gastric body. 3. Mild pulmonary vascular congestion. These results will be called to the ordering clinician or representative by the Radiologist Assistant, and communication documented in the PACS or zVision Dashboard. Electronically Signed: By: Titus Dubin M.D. On: 02/22/2017 11:10   Dg Abd Portable 1 View  Result Date: 02/22/2017 CLINICAL DATA:  Assess  positioning of orogastric tube. EXAM: PORTABLE ABDOMEN - 1 VIEW COMPARISON:  No recent studies in [facility] FINDINGS: The proximal port of the orogastric tube lies at the level of the GE junction with the tip in the proximal body. There is a moderate amount of gas within colon and some within small bowel. No free extraluminal collections are observed. External pacemaker defibrillator pads are present. IMPRESSION: The proximal port of the orogastric tube lies just at the GE junction and the tip is in the proximal gastric body. Advancement by 5-10 cm is recommended to assure that the proximal port remains below the GE junction. Electronically Signed   By: David  Martinique M.D.   On: 02/22/2017 11:08    Initial Impression / Assessment and Plan / ED Course  I have reviewed the triage vital signs and the nursing notes.  Pertinent labs & imaging results that were available during my care of the patient were reviewed by me and considered in my medical decision making (see chart for details). Chest x-ray viewed by me ET tube pulled back 3 cm   Nursing unable to establish peripheral IV access Angiocath insertion Performed by: [name]   Time out: Immediately prior to procedure a "time out" was called to verify the correct patient, procedure, equipment, support staff and site/side marked as required.  Preparation: Patient was prepped and draped in the usual sterile fashion.  Vein Location: Right external jugular    Gauge: 20  Normal blood return and flush without difficulty Patient tolerance: Patient tolerated the procedure well with no immediate complications.   Patient likely aspirated I did speak with patient's sister  Ms.Gaffney who is on her life alert list and was notified that life alert button was pulled.  Ms. Meriel Flavors [place].  Her telephone number is [phone] I told Ms. Meriel Flavors that patient was being evaluated in the hospital.   At 2 PM I was notified the patient  hypothermic with a temperature of 93.7.  She was placed on [facility].  Critical care team consulted for admission to intensive care unit.  2:35 PM she remains minimally responsive to noxious stimulus. Sepsis - Repeat Assessment  Performed at:    235 pm  Vitals     Blood pressure 110/67, pulse 64, temperature (!) 93.9 F (34.4 C), resp. rate 16, height _0  (1.549 m), weight 81.6 kg (180 lb), SpO2 96 %.  Heart:     Regular rate and rhythm  Lungs:    Rhonchi  Capillary Refill:   <2 sec  Peripheral Pulse:   Radial pulse palpable  Skin:     Normal Color IV potassium supplementation ordered  Final Clinical Impressions(s) / ED Diagnoses  Diagnoses #1 acute encephalopathy #2 sepsis #3 hypothermia #4 hypokalemia #5 acute on chronic renal insufficiency  #6 anemia Final diagnoses:  None   CRITICAL CARE Performed by: [name] Total critical care time: 50 minutes Critical care time was exclusive of separately billable procedures and treating other patients. Critical care was necessary to treat or prevent imminent or life-threatening deterioration. Critical care was time spent personally by me on the following activities: development of treatment plan with patient and/or surrogate as well as nursing, discussions with consultants, evaluation of patient's response to treatment, examination of patient, obtaining history from patient or surrogate, ordering and performing treatments and interventions, ordering and review of laboratory studies, ordering and review of radiographic studies, pulse oximetry and re-evaluation of patient's condition. ED Discharge Orders    None       [name], MD 02/22/17 1441    [name], MD 02/22/17 9977    [name], MD 02/22/17 [phone]

## 2017-02-23 ENCOUNTER — Inpatient Hospital Stay (HOSPITAL_COMMUNITY): Payer: Medicare Other

## 2017-02-23 LAB — GLUCOSE, CAPILLARY
GLUCOSE-CAPILLARY: 85 mg/dL (ref 65–99)
GLUCOSE-CAPILLARY: 72 mg/dL (ref 65–99)
GLUCOSE-CAPILLARY: 62 mg/dL — AB (ref 65–99)
GLUCOSE-CAPILLARY: 112 mg/dL — AB (ref 65–99)

## 2017-02-23 LAB — BLOOD GAS, ARTERIAL
ACID-BASE EXCESS: 1.4 mmol/L (ref 0.0–2.0)
Bicarbonate: 26.3 mmol/L (ref 20.0–28.0)
Drawn by: 51155
FIO2: 50
O2 SAT: 94.3 %
PEEP/CPAP: 5 cmH2O
PH ART: 7.361 (ref 7.350–7.450)
Patient temperature: 98.6
RATE: 14 resp/min
VT: 420 mL
pCO2 arterial: 47.6 mmHg (ref 32.0–48.0)
pO2, Arterial: 70.6 mmHg — ABNORMAL LOW (ref 83.0–108.0)

## 2017-02-23 LAB — BASIC METABOLIC PANEL
Anion gap: 10 (ref 5–15)
BUN: 11 mg/dL (ref 6–20)
CALCIUM: 7.1 mg/dL — AB (ref 8.9–10.3)
CO2: 24 mmol/L (ref 22–32)
CREATININE: 1.31 mg/dL — AB (ref 0.44–1.00)
Chloride: 108 mmol/L (ref 101–111)
GFR calc non Af Amer: 41 mL/min — ABNORMAL LOW (ref >60)
GFR, EST AFRICAN AMERICAN: 48 mL/min — AB (ref >60)
Glucose, Bld: 98 mg/dL (ref 65–99)
Potassium: 3.6 mmol/L (ref 3.5–5.1)
SODIUM: 142 mmol/L (ref 135–145)

## 2017-02-23 LAB — CBC
HEMATOCRIT: 37.6 % (ref 36.0–46.0)
Hemoglobin: 12.2 g/dL (ref 12.0–15.0)
MCH: 32.3 pg (ref 26.0–34.0)
MCHC: 32.4 g/dL (ref 30.0–36.0)
MCV: 99.5 fL (ref 78.0–100.0)
Platelets: ADEQUATE 10*3/uL (ref 150–400)
RBC: 3.78 MIL/uL — ABNORMAL LOW (ref 3.87–5.11)
RDW: 15.8 % — ABNORMAL HIGH (ref 11.5–15.5)
WBC: 8.9 10*3/uL (ref 4.0–10.5)

## 2017-02-23 LAB — PHOSPHORUS
PHOSPHORUS: 1.3 mg/dL — AB (ref 2.5–4.6)
Phosphorus: 1.5 mg/dL — ABNORMAL LOW (ref 2.5–4.6)

## 2017-02-23 LAB — MAGNESIUM
MAGNESIUM: 1.7 mg/dL (ref 1.7–2.4)
Magnesium: 1.9 mg/dL (ref 1.7–2.4)

## 2017-02-23 LAB — STREP PNEUMONIAE URINARY ANTIGEN: STREP PNEUMO URINARY ANTIGEN: NEGATIVE

## 2017-02-23 LAB — VALPROIC ACID LEVEL: VALPROIC ACID LVL: 60 ug/mL (ref 50.0–100.0)

## 2017-02-23 LAB — PROCALCITONIN: Procalcitonin: <0.1 ng/mL

## 2017-02-23 LAB — TROPONIN I

## 2017-02-23 LAB — POTASSIUM: Potassium: 4.7 mmol/L (ref 3.5–5.1)

## 2017-02-23 LAB — TSH: TSH: 51.322 u[IU]/mL — AB (ref 0.350–4.500)

## 2017-02-23 LAB — CORTISOL: Cortisol, Plasma: 14.1 ug/dL

## 2017-02-23 MED ORDER — LEVOTHYROXINE SODIUM 100 MCG IV SOLR
25.0000 ug | Freq: Every day | INTRAVENOUS | Status: DC
  Administered 2017-02-23 – 2017-02-27 (×5): 25 ug via INTRAVENOUS
  Filled 2017-02-23 (×5): qty 5

## 2017-02-23 MED ORDER — SODIUM CHLORIDE 0.9 % IV SOLN
0.0000 ug/min | INTRAVENOUS | Status: DC
  Administered 2017-02-24: 40 ug/min via INTRAVENOUS
  Administered 2017-02-24: 50 ug/min via INTRAVENOUS
  Administered 2017-02-25: 30 ug/min via INTRAVENOUS
  Filled 2017-02-23: qty 4
  Filled 2017-02-23: qty 40
  Filled 2017-02-23: qty 4

## 2017-02-23 MED ORDER — SODIUM CHLORIDE 0.9 % IV SOLN
1000.0000 mg | Freq: Two times a day (BID) | INTRAVENOUS | Status: DC
  Administered 2017-02-23 – 2017-03-10 (×31): 1000 mg via INTRAVENOUS
  Filled 2017-02-23 (×32): qty 10

## 2017-02-23 MED ORDER — DEXTROSE 5 % IV SOLN
2.0000 g | INTRAVENOUS | Status: DC
  Administered 2017-02-23: 2 g via INTRAVENOUS
  Filled 2017-02-23: qty 2

## 2017-02-23 MED ORDER — DEXTROSE 50 % IV SOLN
25.0000 mL | Freq: Once | INTRAVENOUS | Status: AC
  Administered 2017-02-23: 25 mL via INTRAVENOUS
  Filled 2017-02-23: qty 50

## 2017-02-23 MED ORDER — SODIUM CHLORIDE 0.9 % IV SOLN: 1000.0000 mg | Freq: Once | INTRAVENOUS | Status: DC

## 2017-02-23 MED ORDER — VITAL HIGH PROTEIN PO LIQD
1000.0000 mL | ORAL | Status: DC
  Administered 2017-02-23 – 2017-03-03 (×9): 1000 mL

## 2017-02-23 MED ORDER — IPRATROPIUM-ALBUTEROL 0.5-2.5 (3) MG/3ML IN SOLN
3.0000 mL | Freq: Four times a day (QID) | RESPIRATORY_TRACT | Status: DC
  Administered 2017-02-23 – 2017-03-08 (×53): 3 mL via RESPIRATORY_TRACT
  Filled 2017-02-23 (×53): qty 3

## 2017-02-23 MED ORDER — SODIUM CHLORIDE 0.9 % IV SOLN
INTRAVENOUS | Status: DC
  Administered 2017-02-25 – 2017-03-07 (×6): via INTRAVENOUS
  Filled 2017-02-23: qty 1000

## 2017-02-23 MED ORDER — HYDROCORTISONE NA SUCCINATE PF 100 MG IJ SOLR
50.0000 mg | Freq: Four times a day (QID) | INTRAMUSCULAR | Status: DC
  Administered 2017-02-23 – 2017-02-27 (×16): 50 mg via INTRAVENOUS
  Filled 2017-02-23 (×8): qty 1
  Filled 2017-02-23: qty 2
  Filled 2017-02-23 (×8): qty 1

## 2017-02-23 MED ORDER — LORAZEPAM 2 MG/ML IJ SOLN
4.0000 mg | Freq: Once | INTRAMUSCULAR | Status: AC
  Administered 2017-02-23: 4 mg via INTRAVENOUS

## 2017-02-23 MED ORDER — PRO-STAT SUGAR FREE PO LIQD
30.0000 mL | Freq: Two times a day (BID) | ORAL | Status: DC
  Administered 2017-02-23: 30 mL
  Filled 2017-02-23: qty 30

## 2017-02-23 MED ORDER — VANCOMYCIN HCL IN DEXTROSE 750-5 MG/150ML-% IV SOLN
750.0000 mg | Freq: Two times a day (BID) | INTRAVENOUS | Status: AC
Start: 2017-02-24 — End: 2017-03-03
  Administered 2017-02-24 – 2017-03-03 (×16): 750 mg via INTRAVENOUS
  Filled 2017-02-23 (×16): qty 150

## 2017-02-23 MED ORDER — ATORVASTATIN CALCIUM 20 MG PO TABS: 20.0000 mg | ORAL_TABLET | Freq: Every day | ORAL | Status: DC

## 2017-02-23 MED ORDER — POTASSIUM PHOSPHATES 15 MMOLE/5ML IV SOLN
20.0000 mmol | Freq: Once | INTRAVENOUS | Status: AC
  Administered 2017-02-23: 20 mmol via INTRAVENOUS
  Filled 2017-02-23: qty 6.67

## 2017-02-23 MED ORDER — FENTANYL CITRATE (PF) 100 MCG/2ML IJ SOLN
50.0000 ug | INTRAMUSCULAR | Status: DC | PRN
  Administered 2017-02-23 – 2017-03-02 (×10): 50 ug via INTRAVENOUS
  Filled 2017-02-23 (×10): qty 2

## 2017-02-23 MED ORDER — FENTANYL CITRATE (PF) 100 MCG/2ML IJ SOLN
50.0000 ug | INTRAMUSCULAR | Status: AC | PRN
  Administered 2017-02-23 – 2017-02-24 (×3): 50 ug via INTRAVENOUS

## 2017-02-23 MED ORDER — VITAL HIGH PROTEIN PO LIQD: 1000.0000 mL | ORAL | Status: DC

## 2017-02-23 MED ORDER — SODIUM CHLORIDE 0.9 % IV SOLN
1500.0000 mg | Freq: Once | INTRAVENOUS | Status: AC
  Administered 2017-02-23: 1500 mg via INTRAVENOUS
  Filled 2017-02-23: qty 15

## 2017-02-23 MED ORDER — ASPIRIN 81 MG PO CHEW
81.0000 mg | CHEWABLE_TABLET | Freq: Every day | ORAL | Status: DC
  Administered 2017-02-23 – 2017-03-17 (×19): 81 mg
  Filled 2017-02-23 (×19): qty 1

## 2017-02-23 MED ORDER — PANTOPRAZOLE SODIUM 40 MG PO PACK
40.0000 mg | PACK | ORAL | Status: DC
  Administered 2017-02-23 – 2017-03-04 (×10): 40 mg
  Filled 2017-02-23 (×11): qty 20

## 2017-02-23 MED ORDER — ATORVASTATIN CALCIUM 10 MG PO TABS
20.0000 mg | ORAL_TABLET | Freq: Every day | ORAL | Status: DC
  Administered 2017-02-23 – 2017-03-16 (×17): 20 mg
  Filled 2017-02-23: qty 1
  Filled 2017-02-23: qty 2
  Filled 2017-02-23: qty 1
  Filled 2017-02-23: qty 2
  Filled 2017-02-23 (×7): qty 1
  Filled 2017-02-23 (×2): qty 2
  Filled 2017-02-23 (×4): qty 1
  Filled 2017-02-23 (×3): qty 2
  Filled 2017-02-23 (×2): qty 1

## 2017-02-23 MED ORDER — DEXTROSE 50 % IV SOLN
INTRAVENOUS | Status: AC
  Administered 2017-02-23: 25 mL via INTRAVENOUS
  Filled 2017-02-23: qty 50

## 2017-02-23 NOTE — Progress Notes (Signed)
Subjective: No significant changes  Exam: Vitals:   02/23/17 1125 02/23/17 1300  BP:  125/64  Pulse: (!) 50 61  Resp: 14 14  Temp:  98.8 F (37.1 C)  SpO2: 97% 96%   Gen: In bed, NAD Resp: non-labored breathing, no acute distress Abd: soft, nt  Neuro: MS: Does not open eyes or follow commands CN: Peoples equal round and reactive, corneals intact Motor: She withdraws to noxious stimulation in all 4 extremities Sensory: As above  Impression: 66 year old female found down with episode of posturing versus seizure.  The EEG has a pattern which is most likely non-ictal, but which I do feel in the right clinical context could represent post-ictal changes.  Also possible would be that she suffered some type of hypoxic injury, an MRI could be helpful here.  Recommendations: 1) MRI brain 2) Will consider more aggressive treatment of  EEG pattern if no findings on MRI 3) will follow.   Roland Rack, MD [facility] [phone]  If 7pm- 7am, please page neurology on call as listed in [place].

## 2017-02-23 NOTE — Progress Notes (Signed)
PULMONARY / CRITICAL CARE MEDICINE   Name: Dominique Acosta MRN: [id number] DOB: [birth year]    ADMISSION DATE:  02/22/2017  REFERRING MD:  EDP  CHIEF COMPLAINT:  AMS  HISTORY OF PRESENT ILLNESS:   66 yo female former smoker called EMS and the found unresponsive with agonal respirations and hypothermia at home.  Required intubation for airway protection. PMHx of COPD, CAD, Depression, Schizophrenia, dystonia, seizures, chronic pain, substance abuse, HLD, Hypothyroidism.  SUBJECTIVE:  Remains on full vent support.  Increased respiratory secretions.  VITAL SIGNS: BP (!) 109/53   Pulse (!) 51   Temp 99.1 F (37.3 C)   Resp 14   Ht 5\' 1"  (1.549 m)   Wt 189 lb 2.5 oz (85.8 kg)   SpO2 95%   BMI 35.74 kg/m   VENTILATOR SETTINGS: Vent Mode: PRVC FiO2 (%):  [50 %-100 %] 50 % Set Rate:  [14 bmp] 14 bmp Vt Set:  [420 mL] 420 mL PEEP:  [5 cmH20] 5 cmH20 Plateau Pressure:  [16 cmH20-20 cmH20] 17 cmH20  INTAKE / OUTPUT: I/O last 3 completed shifts: In: 4760.5 [I.V.:1114.5; NG/GT:120; IV Piggyback:3526] Out: 6010 [Urine:1460]  PHYSICAL EXAMINATION:  General - on vent Eyes - pupils dilated and reactive ENT - ETT in place Cardiac - regular, no murmur Chest - b/l rhonchi Abd - soft, non tender Ext - no edema Skin - no rashes Neuro - grimaces with stimulation   LABS:  BMET Recent Labs  Lab 02/22/17 1256 02/22/17 2356  NA 140  --   K 2.7* 4.7  CL 108  --   CO2 26  --   BUN 15  --   CREATININE 1.68*  --   GLUCOSE 127*  --     Electrolytes Recent Labs  Lab 02/22/17 1256 02/22/17 2029  CALCIUM 7.0*  --   MG  --  2.1  PHOS  --  3.0    CBC Recent Labs  Lab 02/22/17 1256  WBC 10.0  HGB 11.7*  HCT 35.9*  PLT 261    Coag's Recent Labs  Lab 02/22/17 2029  APTT 28  INR 1.07    Sepsis Markers Recent Labs  Lab 02/22/17 1314 02/22/17 2029 02/22/17 2356  LATICACIDVEN 0.73  --   --   PROCALCITON  --  <0.10 <0.10    ABG Recent Labs  Lab  02/23/17 0500  PHART 7.361  PCO2ART 47.6  PO2ART 70.6*    Liver Enzymes Recent Labs  Lab 02/22/17 1256  AST 29  ALT 28  ALKPHOS 62  BILITOT 1.2  ALBUMIN 3.1*    Cardiac Enzymes Recent Labs  Lab 02/22/17 2029 02/22/17 2356  TROPONINI <0.03 <0.03    Glucose Recent Labs  Lab 02/22/17 1743  GLUCAP 75    Lab Results  Component Value Date   TSH 63.98 (A) 09/01/2016     Imaging Ct Head Wo Contrast  Result Date: 02/22/2017 CLINICAL DATA:  Patient found unresponsive this morning. EXAM: CT HEAD WITHOUT CONTRAST TECHNIQUE: Contiguous axial images were obtained from the base of the skull through the vertex without intravenous contrast. COMPARISON:  07/28/2016 FINDINGS: Brain: No evidence of acute infarction, hemorrhage, hydrocephalus, extra-axial collection or mass lesion/mass effect. Moderate brain parenchymal volume loss and microangiopathy. Vascular: Calcific atherosclerotic disease at the skullbase. Skull: Normal. Negative for fracture or focal lesion. Sinuses/Orbits: Partial opacification of the ethmoid air cells. The remainder of the visualized paranasal sinuses and mastoid air cells are normally aerated. Other: None. IMPRESSION: No acute intracranial abnormality. Atrophy, chronic  microvascular disease. Ethmoid sinusitis. Electronically Signed   By: Fidela Salisbury M.D.   On: 02/22/2017 13:55   Dg Chest Port 1 View  Result Date: 02/23/2017 CLINICAL DATA:  Shortness of breath. Endotracheal slice ventilator support. EXAM: PORTABLE CHEST 1 VIEW COMPARISON:  02/22/2017 FINDINGS: Endotracheal tube tip is 2 cm above the carina. Nasogastric tube enters stomach. There is mild patchy atelectasis at both lung bases. Upper lobes are clear. Healing fracture of the left proximal humerus as seen previously. IMPRESSION: Endotracheal tube and nasogastric tube well positioned. Mild patchy basilar atelectasis. Electronically Signed   By: Nelson Chimes M.D.   On: 02/23/2017 07:12   Dg  Chest Port 1 View  Result Date: 02/22/2017 CLINICAL DATA:  Acute respiratory failure. EXAM: PORTABLE CHEST 1 VIEW COMPARISON:  04/24/2016 FINDINGS: Endotracheal tube terminates 19 mm above the carina. Enteric catheter overlies gastric body, side hole at the expected location of the GE junction. Cardiomediastinal silhouette is normal. Mediastinal contours appear intact. Calcific atherosclerotic disease of the aorta. There is no evidence of focal airspace consolidation, pleural effusion or pneumothorax. Osseous structures are without acute abnormality. Soft tissues are grossly normal. IMPRESSION: Support apparatus as described. No evidence of pneumothorax. Electronically Signed   By: Fidela Salisbury M.D.   On: 02/22/2017 21:37   Dg Chest Port 1 View  Addendum Date: 02/22/2017   ADDENDUM REPORT: 02/22/2017 11:25 ADDENDUM: These results were called by telephone on 02/22/2017 at 11:15 am to Dr. [name] , who verbally acknowledged these results. Electronically Signed   By: Titus Dubin M.D.   On: 02/22/2017 11:25   Result Date: 02/22/2017 CLINICAL DATA:  Check endotracheal tube and OG tube placement. EXAM: PORTABLE CHEST 1 VIEW COMPARISON:  Chest x-ray dated Aug 18, 2016. FINDINGS: Endotracheal tube with the tip in the proximal right mainstem bronchus. Enteric tube seen entering the stomach, with the tip beyond the field of view. The cardiomediastinal silhouette is normal in size. Mild pulmonary vascular congestion. No focal consolidation, pleural effusion, or pneumothorax. No acute osseous abnormality. Prior reverse right shoulder arthroplasty. IMPRESSION: 1. Endotracheal tube with the tip in the proximal right mainstem bronchus. Recommend retraction 5 cm. 2. Enteric tube seen entering the stomach, with the distal side port in the gastric body. 3. Mild pulmonary vascular congestion. These results will be called to the ordering clinician or representative by the Radiologist Assistant, and  communication documented in the PACS or zVision Dashboard. Electronically Signed: By: Titus Dubin M.D. On: 02/22/2017 11:10   Dg Abd Portable 1 View  Result Date: 02/22/2017 CLINICAL DATA:  Assess positioning of orogastric tube. EXAM: PORTABLE ABDOMEN - 1 VIEW COMPARISON:  No recent studies in [facility] FINDINGS: The proximal port of the orogastric tube lies at the level of the GE junction with the tip in the proximal body. There is a moderate amount of gas within colon and some within small bowel. No free extraluminal collections are observed. External pacemaker defibrillator pads are present. IMPRESSION: The proximal port of the orogastric tube lies just at the GE junction and the tip is in the proximal gastric body. Advancement by 5-10 cm is recommended to assure that the proximal port remains below the GE junction. Electronically Signed   By: David  Martinique M.D.   On: 02/22/2017 11:08   STUDIES:  CT head 11/14 >> no acute findings EEG 11/14 >> diffuse slowing and partial burst suppression pattern  CULTURES: BC x 2 11/14>>> Urine 11/14>>> Trach asp 11/14 >> Legionella Ag 11/14 >> Pneumococcal  Ag 11/14 >>   ANTIBIOTICS: vanc 11/14>>> Levaquin 11/14 >>>  Aztreonam 11/14>>>  SIGNIFICANT EVENTS: 11/14 Admit, neurology consulted  LINES/TUBES: ETT 11/14>>>  DISCUSSION: 66 yo with acute respiratory failure, altered mental status, and hypothermia.  She has hx of hypothyroidism, schizophrenia, chronic pain, and polysubstance abuse.  It is uncertain whether she has been taking synthroid.  She also has aspiration pneumonia.  ASSESSMENT / PLAN:  Acute respiratory failure with hypoxia and compromised airway. Hx of COPD. - full vent support - f/u CXR - scheduled BDs  Aspiration pneumonia. - PCN allergy - day 2 of vancomycin, aztreonam, levaquin  Hx of hypothyroidism. - check TSH, cortisol - start synthroid 25 mcg IV  Acute metabolic encephalopathy >> concern for anoxic injury  versus non convulsive status Hx of depression, chronic pain, schizophrenia, polysubstance abuse, dystonia, seizures. - f/u MRI brain - hold outpt neurontin, baclofen, ativan, restoril, cogentin, effexor  Hx of CAD, HLD. - ASA, lipitor  Hypokalemia. Acute renal failure from ATN. - replace electrolytes - continue IV fluids  DVT prophylaxis - SQ heparin SUP - protonix Nutrition - tube feeds Goals of care - DNR  D/w Dr. Leonel Ramsay  CC time 31 minutes  Chesley Mires, MD [place] 02/23/2017, 8:49 AM Pager:  [phone] After 3pm call: [phone]

## 2017-02-23 NOTE — Progress Notes (Signed)
Initial Nutrition Assessment  DOCUMENTATION CODES:   Obesity unspecified  INTERVENTION:    Vital High Protein at 45 ml/h (1080 ml per day)  D/C Pro-stat  Provides 1080 kcal, 95 gm protein, 903 ml free water daily  NUTRITION DIAGNOSIS:   Inadequate oral intake related to inability to eat as evidenced by NPO status.  GOAL:   Provide needs based on ASPEN/SCCM guidelines  MONITOR:   Vent status, TF tolerance, Labs, I & O's  REASON FOR ASSESSMENT:   Ventilator, Consult Enteral/tube feeding initiation and management  ASSESSMENT:   66 yo female with PMH of epilepsy, hypothyroidism, HLD, depression, CAD, leukemia, MI, COPD, schizophrenia, psychosis who was admitted on 11/14 with hypoxic acute respiratory failure, aspiration PNA, requiring intubation on admission.  Discussed patient in ICU rounds and with RN today. Received MD Consult for TF initiation and management. Patient is currently in MRI.  Continuous EEG monitoring ongoing. Patient is currently intubated on ventilator support Temp (24hrs), Avg:96.7 F (35.9 C), Min:93.6 F (34.2 C), Max:99.3 F (37.4 C)   Labs reviewed. Triglycerides 264 (H) Medications reviewed. MRSA +  NUTRITION - FOCUSED PHYSICAL EXAM:  Unable to complete NFPE at this time  Diet Order:  Diet NPO time specified  EDUCATION NEEDS:   No education needs have been identified at this time  Skin:  Skin Assessment: Reviewed RN Assessment  Last BM:  unknown  Height:   Ht Readings from Last 1 Encounters:  02/22/17 5\' 1"  (1.549 m)    Weight:   Wt Readings from Last 1 Encounters:  02/23/17 189 lb 2.5 oz (85.8 kg)    Ideal Body Weight:  47.7 kg  BMI:  Body mass index is 35.74 kg/m.  Estimated Nutritional Needs:   Kcal:  [phone]  Protein:  95 gm  Fluid:  1.2-1.5 L   Molli Barrows, RD, LDN, CNSC Pager [phone] After Hours Pager [phone]

## 2017-02-23 NOTE — Progress Notes (Signed)
vLTM EEG up and running. Pt was disconnected earlier this morning for a MRI. Notified Neuro. No skin breakdown

## 2017-02-23 NOTE — Progress Notes (Signed)
LTM discontinued. Pt going to MRI. No skin breakdown noted.

## 2017-02-23 NOTE — Procedures (Signed)
LTM-EEG Report  HISTORY: Continuous video-EEG monitoring performed for 66 year old with altered mental status, possible seizures.  ACQUISITION: International 10-20 system for electrode placement; 18 channels with additional eyes linked to ipsilateral ears and EKG. Additional T1-T2 electrodes were used. Continuous video recording obtained.   EEG NUMBER: MEDICATIONS:  Day 1: VPA, LZP, fentanyl  DAY #1: from 7116 02/22/17 to 0730 02/23/17  BACKGROUND: An overall medium voltage discontinuous recording with poor spontaneous variability and reactivity. The record consisted of medium voltage, sharp theta-delta bilaterally with no evidence of a posterior basic rhythm and sparse superimposed faster frequencies. This activity was interrupted by periods of diffuse voltage attenuation lasting 1-2 seconds, increasing during the late evening hours with increased medications. EPILEPTIFORM/PERIODIC ACTIVITY: none SEIZURES: none EVENTS: none   EKG: no significant arrhythmia  SUMMARY: This was a markedly abnormal continuous video EEG due to loss of normal background, diffuse sharply contoured slowing, and a partial burst-suppression pattern. This was indicative of a severe global cerebral disturbance with periods of attenuation likely due to medications. No seizures were seen.

## 2017-02-23 NOTE — Procedures (Signed)
Central Venous Catheter Insertion Procedure Note Dominique Acosta [id number] 12-01-1950  Procedure: Insertion of Central Venous Catheter Indications: Assessment of intravascular volume, Drug and/or fluid administration and Frequent blood sampling  Procedure Details Consent: Risks of procedure as well as the alternatives and risks of each were explained to the (patient/caregiver).  Consent for procedure obtained. Time Out: Verified patient identification, verified procedure, site/side was marked, verified correct patient position, special equipment/implants available, medications/allergies/relevent history reviewed, required imaging and test results available.  Performed  Maximum sterile technique was used including antiseptics, cap, gloves, gown, hand hygiene, mask and sheet. Skin prep: Chlorhexidine; local anesthetic administered A antimicrobial bonded/coated triple lumen catheter was placed in the right internal jugular vein using the Seldinger technique.  Evaluation Blood flow good Complications: No apparent complications Patient did tolerate procedure well. Chest X-ray ordered to verify placement.  CXR: pending.  Hayden Pedro, AGACNP-BC [facility]  Pgr: [phone]  PCCM Pgr: [phone]

## 2017-02-23 NOTE — Progress Notes (Signed)
Pharmacy Antibiotic Note  Dominique Acosta is a 66 y.o. female admitted on 02/22/2017 with sepsis and started on vancomycin, aztreonam and levofloxacin.  Pharmacy has been consulted for transition from levofloxacin and aztreonam to cefepime.  Patient's QTc is elevated at 573ms and she has tolerated cephalosporin earlier this year.  Patient's renal function and temperature are improving and her WBC remains WNL.   Plan: Change vanc to 750mg  IV Q12H, start tomorrow Cefepime 2gm IV Q24H Monitor renal fxn, micro data, vanc trough as indicated  Height: 5\' 1"  (154.9 cm) Weight: 189 lb 2.5 oz (85.8 kg) IBW/kg (Calculated) : 47.8  Temp (24hrs), Avg:97.4 F (36.3 C), Min:94.3 F (34.6 C), Max:99.3 F (37.4 C)  Recent Labs  Lab 02/22/17 1256 02/22/17 1314 02/23/17 0900  WBC 10.0  --  8.9  CREATININE 1.68*  --  1.31*  LATICACIDVEN  --  0.73  --     Estimated Creatinine Clearance: 41.4 mL/min (A) (by C-G formula based on SCr of 1.31 mg/dL (H)).    Allergies  Allergen Reactions  . Amoxicillin Anaphylaxis and Other (See Comments)    Childhood allergy >> tolerated cephalosporins  Has patient had a PCN reaction causing immediate rash, facial/tongue/throat swelling, SOB or lightheadedness with hypotension: Yes Has patient had a PCN reaction causing severe rash involving mucus membranes or skin necrosis: No Has patient had a PCN reaction that required hospitalization No Has patient had a PCN reaction occurring within the last 10 years: No If all of the above answers are "NO", then may proceed with Cephalosporin use.  Marland Kitchen Keflex [Cephalexin] Diarrhea  . Penicillins Anaphylaxis and Other (See Comments)    Childhood allergy Has patient had a PCN reaction causing immediate rash, facial/tongue/throat swelling, SOB or lightheadedness with hypotension: Yes Has patient had a PCN reaction causing severe rash involving mucus membranes or skin necrosis: No Has patient had a PCN reaction that required  hospitalization No Has patient had a PCN reaction occurring within the last 10 years: No If all of the above answers are "NO", then may proceed with Cephalosporin use.   Marland Kitchen Phenytoin Other (See Comments)    Reaction:  CNS disorder   . Dilaudid [Hydromorphone Hcl] Other (See Comments)    Reaction:  Psychosis   . Haldol [Haloperidol] Other (See Comments)    Reaction:  Psychosis   . Morphine And Related Other (See Comments)    Reaction:  Psychosis      Vanc 11/14 >> LVQ 11/14 >> 11/15 Azactam 11/14 >> 11/15 Cefepime 11/15 >>  11/14 MRSA PCR - positive 11/14 TA -  11/14  BCx -    Esta Carmon D. Mina Marble, PharmD, BCPS Pager:  [phone] 02/23/2017, 2:58 PM

## 2017-02-24 ENCOUNTER — Inpatient Hospital Stay (HOSPITAL_COMMUNITY): Payer: Medicare Other

## 2017-02-24 DIAGNOSIS — E035 Myxedema coma: Secondary | ICD-10-CM

## 2017-02-24 DIAGNOSIS — J690 Pneumonitis due to inhalation of food and vomit: Principal | ICD-10-CM

## 2017-02-24 DIAGNOSIS — E2740 Unspecified adrenocortical insufficiency: Secondary | ICD-10-CM

## 2017-02-24 LAB — CBC
HCT: 35.2 % — ABNORMAL LOW (ref 36.0–46.0)
HEMOGLOBIN: 11.7 g/dL — AB (ref 12.0–15.0)
MCH: 31.8 pg (ref 26.0–34.0)
MCHC: 33.2 g/dL (ref 30.0–36.0)
MCV: 95.7 fL (ref 78.0–100.0)
Platelets: 333 10*3/uL (ref 150–400)
RBC: 3.68 MIL/uL — ABNORMAL LOW (ref 3.87–5.11)
RDW: 15.1 % (ref 11.5–15.5)
WBC: 11.4 10*3/uL — ABNORMAL HIGH (ref 4.0–10.5)

## 2017-02-24 LAB — COMPREHENSIVE METABOLIC PANEL
ALBUMIN: 2.7 g/dL — AB (ref 3.5–5.0)
ALT: 20 U/L (ref 14–54)
ANION GAP: 8 (ref 5–15)
AST: 23 U/L (ref 15–41)
Alkaline Phosphatase: 58 U/L (ref 38–126)
BUN: 11 mg/dL (ref 6–20)
CALCIUM: 7.1 mg/dL — AB (ref 8.9–10.3)
CO2: 25 mmol/L (ref 22–32)
CREATININE: 0.95 mg/dL (ref 0.44–1.00)
Chloride: 104 mmol/L (ref 101–111)
GFR calc Af Amer: >60 mL/min (ref >60)
GFR calc non Af Amer: >60 mL/min (ref >60)
GLUCOSE: 190 mg/dL — AB (ref 65–99)
Potassium: 2.8 mmol/L — ABNORMAL LOW (ref 3.5–5.1)
SODIUM: 137 mmol/L (ref 135–145)
Total Bilirubin: 0.6 mg/dL (ref 0.3–1.2)
Total Protein: 5.6 g/dL — ABNORMAL LOW (ref 6.5–8.1)

## 2017-02-24 LAB — GLUCOSE, CAPILLARY
GLUCOSE-CAPILLARY: 124 mg/dL — AB (ref 65–99)
GLUCOSE-CAPILLARY: 149 mg/dL — AB (ref 65–99)
GLUCOSE-CAPILLARY: 164 mg/dL — AB (ref 65–99)
GLUCOSE-CAPILLARY: 180 mg/dL — AB (ref 65–99)
Glucose-Capillary: 145 mg/dL — ABNORMAL HIGH (ref 65–99)
Glucose-Capillary: 146 mg/dL — ABNORMAL HIGH (ref 65–99)
Glucose-Capillary: 164 mg/dL — ABNORMAL HIGH (ref 65–99)

## 2017-02-24 LAB — LEGIONELLA PNEUMOPHILA SEROGP 1 UR AG: L. pneumophila Serogp 1 Ur Ag: NEGATIVE

## 2017-02-24 LAB — CALCIUM, IONIZED: CALCIUM, IONIZED, SERUM: 4 mg/dL — AB (ref 4.5–5.6)

## 2017-02-24 LAB — MAGNESIUM
MAGNESIUM: 1.8 mg/dL (ref 1.7–2.4)
Magnesium: 2.2 mg/dL (ref 1.7–2.4)

## 2017-02-24 LAB — TRIGLYCERIDES: Triglycerides: 198 mg/dL — ABNORMAL HIGH (ref <150)

## 2017-02-24 LAB — PROCALCITONIN: Procalcitonin: <0.1 ng/mL

## 2017-02-24 LAB — PHOSPHORUS
Phosphorus: 1.6 mg/dL — ABNORMAL LOW (ref 2.5–4.6)
Phosphorus: 2.1 mg/dL — ABNORMAL LOW (ref 2.5–4.6)

## 2017-02-24 MED ORDER — PROPOFOL 1000 MG/100ML IV EMUL
INTRAVENOUS | Status: AC
  Administered 2017-02-24: 20 ug/kg/min via INTRAVENOUS
  Filled 2017-02-24: qty 100

## 2017-02-24 MED ORDER — DEXTROSE 5 % IV SOLN
20.0000 mmol | Freq: Once | INTRAVENOUS | Status: AC
  Administered 2017-02-24: 20 mmol via INTRAVENOUS
  Filled 2017-02-24: qty 6.67

## 2017-02-24 MED ORDER — PROPOFOL 1000 MG/100ML IV EMUL
5.0000 ug/kg/min | INTRAVENOUS | Status: DC
  Administered 2017-02-24: 20 ug/kg/min via INTRAVENOUS
  Administered 2017-02-24: 10 ug/kg/min via INTRAVENOUS
  Administered 2017-02-24: 20 ug/kg/min via INTRAVENOUS
  Administered 2017-02-25 (×2): 30 ug/kg/min via INTRAVENOUS
  Administered 2017-02-26: 70 ug/kg/min via INTRAVENOUS
  Administered 2017-02-26: 40 ug/kg/min via INTRAVENOUS
  Administered 2017-02-26: 45 ug/kg/min via INTRAVENOUS
  Administered 2017-02-26: 65 ug/kg/min via INTRAVENOUS
  Administered 2017-02-26: 45 ug/kg/min via INTRAVENOUS
  Administered 2017-02-27 (×2): 35 ug/kg/min via INTRAVENOUS
  Administered 2017-02-27: 40 ug/kg/min via INTRAVENOUS
  Administered 2017-02-27: 20 ug/kg/min via INTRAVENOUS
  Filled 2017-02-24 (×17): qty 100

## 2017-02-24 MED ORDER — DEXTROSE 5 % IV SOLN
2.0000 g | Freq: Two times a day (BID) | INTRAVENOUS | Status: DC
  Administered 2017-02-24 – 2017-02-26 (×5): 2 g via INTRAVENOUS
  Filled 2017-02-24 (×6): qty 2

## 2017-02-24 MED ORDER — MAGNESIUM SULFATE IN D5W 1-5 GM/100ML-% IV SOLN
1.0000 g | Freq: Once | INTRAVENOUS | Status: AC
  Administered 2017-02-24: 1 g via INTRAVENOUS
  Filled 2017-02-24: qty 100

## 2017-02-24 NOTE — Progress Notes (Signed)
PULMONARY / CRITICAL CARE MEDICINE   Name: Dominique Acosta MRN: [id number] DOB: [birth year]    ADMISSION DATE:  02/22/2017  REFERRING MD:  EDP  CHIEF COMPLAINT:  AMS  HISTORY OF PRESENT ILLNESS:   66 yo female former smoker called EMS and the found unresponsive with agonal respirations and hypothermia at home.  Required intubation for airway protection. PMHx of COPD, CAD, Depression, Schizophrenia, dystonia, seizures, chronic pain, substance abuse, HLD, Hypothyroidism.  SUBJECTIVE:  Agitated overnight and started on diprivan.  VITAL SIGNS: BP 117/80   Pulse (!) 57   Temp 98.1 F (36.7 C)   Resp 16   Ht 5\' 1"  (1.549 m)   Wt 187 lb 9.8 oz (85.1 kg)   SpO2 100%   BMI 35.45 kg/m   VENTILATOR SETTINGS: Vent Mode: PRVC FiO2 (%):  [40 %-50 %] 40 % Set Rate:  [14 bmp-18 bmp] 18 bmp Vt Set:  [420 mL] 420 mL PEEP:  [5 cmH20] 5 cmH20 Plateau Pressure:  [17 cmH20-20 cmH20] 17 cmH20  INTAKE / OUTPUT: I/O last 3 completed shifts: In: 6328.3 [I.V.:4044.9; NG/GT:846.8; IV Piggyback:1436.7] Out: 2870 [Urine:2870]  PHYSICAL EXAMINATION:  General - sedated Eyes - pupils reactive ENT - ETT in place Cardiac - regular, no murmur Chest - coarse breath sounds on Lt > Rt Abd - soft, non tender Ext - no edema Skin - no rashes Neuro - RASS -3   LABS:  BMET Recent Labs  Lab 02/22/17 1256 02/22/17 2356 02/23/17 0900 02/24/17 0608  NA 140  --  142 137  K 2.7* 4.7 3.6 2.8*  CL 108  --  108 104  CO2 26  --  24 25  BUN 15  --  11 11  CREATININE 1.68*  --  1.31* 0.95  GLUCOSE 127*  --  98 190*    Electrolytes Recent Labs  Lab 02/22/17 1256  02/23/17 0900 02/23/17 1522 02/24/17 0608  CALCIUM 7.0*  --  7.1*  --  7.1*  MG  --    < > 1.9 1.7 1.8  PHOS  --    < > 1.5* 1.3* 1.6*   < > = values in this interval not displayed.    CBC Recent Labs  Lab 02/22/17 1256 02/23/17 0900 02/24/17 0608  WBC 10.0 8.9 11.4*  HGB 11.7* 12.2 11.7*  HCT 35.9* 37.6 35.2*  PLT 261  PLATELET CLUMPS NOTED ON SMEAR, COUNT APPEARS ADEQUATE 333    Coag's Recent Labs  Lab 02/22/17 2029  APTT 28  INR 1.07    Sepsis Markers Recent Labs  Lab 02/22/17 1314 02/22/17 2029 02/22/17 2356 02/24/17 0608  LATICACIDVEN 0.73  --   --   --   PROCALCITON  --  <0.10 <0.10 <0.10    ABG Recent Labs  Lab 02/23/17 0500  PHART 7.361  PCO2ART 47.6  PO2ART 70.6*    Liver Enzymes Recent Labs  Lab 02/22/17 1256 02/24/17 0608  AST 29 23  ALT 28 20  ALKPHOS 62 58  BILITOT 1.2 0.6  ALBUMIN 3.1* 2.7*    Cardiac Enzymes Recent Labs  Lab 02/22/17 2029 02/22/17 2356  TROPONINI <0.03 <0.03    Glucose Recent Labs  Lab 02/23/17 1527 02/23/17 2011 02/23/17 2034 02/24/17 0011 02/24/17 0400 02/24/17 0713  GLUCAP 72 62* 112* 180* 149* 145*    Lab Results  Component Value Date   TSH 51.322 (H) 02/23/2017     Imaging Mr Brain Wo Contrast  Result Date: 02/23/2017 CLINICAL DATA:  Unresponsive with agonal  respirations. Hypothermia. Patient found unresponsive on 02/22/2017. Patient remains on ventilator support. EXAM: MRI HEAD WITHOUT CONTRAST TECHNIQUE: Multiplanar, multiecho pulse sequences of the brain and surrounding structures were obtained without intravenous contrast. COMPARISON:  CT head 02/22/2017.  No prior MR. FINDINGS: Brain: No evidence for acute infarction, hemorrhage, mass lesion, hydrocephalus, or extra-axial fluid. Generalized atrophy. Mild subcortical and periventricular T2 and FLAIR hyperintensities, likely chronic microvascular ischemic change. No signs of anoxic injury in the basal ganglia or cortex. No gyriform restricted diffusion to suggest ictal/postictal phenomenon. Vascular: Flow voids are maintained throughout the carotid, basilar, and vertebral arteries. There are no areas of chronic hemorrhage. Skull and upper cervical spine: Unremarkable visualized calvarium, skullbase, and cervical vertebrae. Pituitary, pineal, cerebellar tonsils  unremarkable. No upper cervical cord lesions. Sinuses/Orbits: Chronic mucosal thickening of the sinuses. RIGHT cataract extraction. Layering fluid in the sphenoid likely due to recumbent positioning. Other: Layering fluid in the nasopharynx, with BILATERAL mastoid effusions, greater on the LEFT. IMPRESSION: Atrophy and small vessel disease.  No acute intracranial findings. No acute stroke, hemorrhage, or evidence for anoxic injury. No imaging features to support status epilepticus. Flow voids are maintained in the major intracranial vessels. A cause for the patient's symptoms is not identified. Electronically Signed   By: Staci Righter M.D.   On: 02/23/2017 13:17   Dg Chest Port 1 View  Result Date: 02/24/2017 CLINICAL DATA:  Shortness of breath. Aspiration pneumonia. Ventilator support. EXAM: PORTABLE CHEST 1 VIEW COMPARISON:  02/23/2017 FINDINGS: Endotracheal tube tip is 3 cm above the carina. Nasogastric tube enters the stomach. Right internal jugular central line tip in the SVC at the azygos level. Radiographic worsening of infiltrate and volume loss in the left lower lobe and a lesser extent in the right lower lung. No qualitatively new finding. IMPRESSION: Lines and tubes well positioned. Radiographic worsening of pulmonary infiltrates and volume loss left more than right. Electronically Signed   By: Nelson Chimes M.D.   On: 02/24/2017 07:53   Dg Chest Port 1 View  Result Date: 02/23/2017 CLINICAL DATA:  Central line placement. EXAM: PORTABLE CHEST 1 VIEW COMPARISON:  Earlier the same day. FINDINGS: 2032 hours. Endotracheal tube tip is 2.4 cm above the base of the carina. NG tube tip is in the proximal stomach. Proximal port of the NG tube is in the region of the EG junction. Right IJ central line is new in the interval with tip overlying the mid SVC level. No right pneumothorax. Lung volumes are low with persistent patchy opacity at the lung bases, left greater than right compatible with atelectasis  and/or pneumonia. The cardiopericardial silhouette is within normal limits for size. Nonacute left humeral neck fracture noted. Patient is status post right shoulder replacement. IMPRESSION: 1. Interval placement of right IJ central line with tip overlying the mid SVC level and no evidence for right pneumothorax. 2. Otherwise stable exam. Electronically Signed   By: Misty Stanley M.D.   On: 02/23/2017 21:00   STUDIES:  CT head 11/14 >> no acute findings EEG 11/14 >> diffuse slowing and partial burst suppression pattern MRI brain 11/15 >> atrophy and small vessel disease  CULTURES: BC x 2 11/14>>> Urine 11/14>>> Trach asp 11/14 >> Legionella Ag 11/14 >> Pneumococcal Ag 11/14 >> negative  ANTIBIOTICS: vancomycin 11/14>>> Levaquin 11/14 >>> 11/15 Aztreonam 11/14>>> 11/15 Cefepime 11/15 >>   SIGNIFICANT EVENTS: 11/14 Admit, neurology consulted 11/15 continuous EEG  LINES/TUBES: ETT 11/14>>> Rt IJ CVL 11/15>>>  DISCUSSION: 66 yo with acute respiratory failure, altered  mental status, and hypothermia.  She has hx of hypothyroidism, schizophrenia, chronic pain, and polysubstance abuse.  It is uncertain whether she has been taking synthroid.  She also has aspiration pneumonia.  ASSESSMENT / PLAN:  Acute respiratory failure with hypoxia and compromised airway. Hx of COPD. - full vent support - f/u CXR - scheduled BDs  Aspiration pneumonia. - has PCN allergy listed, but tolerated cephalosporins previously - day 3 of Abx  Hypothyroidism. Adrenal insufficiency >> cortisol 14.1 from 11/15. - continue synthroid, solu cortef  Acute metabolic encephalopathy >> concern for anoxic injury versus non convulsive status Hx of depression, chronic pain, schizophrenia, polysubstance abuse, dystonia, seizures. - RASS goal -1 - AEDs per neurology - hold outpt neurontin, baclofen, ativan, restoril, cogentin, effexor  Hx of CAD, HLD. - ASA, lipitor  Hypokalemia, hypophosphatemia,  hypomagnesemia. Acute renal failure from ATN >> improved. - replace electrolytes - continue IV fluids  DVT prophylaxis - SQ heparin SUP - protonix Nutrition - tube feeds Goals of care - DNR  CC time 52 minutes  Chesley Mires, MD [place] [facility] 02/24/2017, 8:28 AM Pager:  [phone] After 3pm call: [phone]

## 2017-02-24 NOTE — Progress Notes (Addendum)
[facility] Progress Note Patient Name: Hoa Briggs DOB: 09-13-1950 MRN: [id number]   Date of Service  02/24/2017  HPI/Events of Note  Agitated, almost falling out of bed PRN meds are not helping  eICU Interventions  Propofol gtt     Intervention Category Minor Interventions: Agitation / anxiety - evaluation and management  Roel Douthat 02/24/2017, 12:22 AM

## 2017-02-24 NOTE — Progress Notes (Signed)
Pharmacy Antibiotic Note  Dominique Acosta is a 66 y.o. female admitted on 02/22/2017 with sepsis currently on vancomycin and cefepime.  Patient's renal function is improving.  She has intermittent hypothermia and her WBC is WNL.   Plan: Continue vanc 750mg  IV Q12H Change cefepime 2gm IV Q12H Monitor renal fxn, micro data, vanc trough as indicated F/u lytes supplementation   Height: 5\' 1"  (154.9 cm) Weight: 187 lb 9.8 oz (85.1 kg) IBW/kg (Calculated) : 47.8  Temp (24hrs), Avg:98.6 F (37 C), Min:97.5 F (36.4 C), Max:99.3 F (37.4 C)  Recent Labs  Lab 02/22/17 1256 02/22/17 1314 02/23/17 0900 02/24/17 0608  WBC 10.0  --  8.9 11.4*  CREATININE 1.68*  --  1.31* 0.95  LATICACIDVEN  --  0.73  --   --     Estimated Creatinine Clearance: 56.9 mL/min (by C-G formula based on SCr of 0.95 mg/dL).    Allergies  Allergen Reactions  . Amoxicillin Anaphylaxis and Other (See Comments)    Childhood allergy >> tolerated cephalosporins  Has patient had a PCN reaction causing immediate rash, facial/tongue/throat swelling, SOB or lightheadedness with hypotension: Yes Has patient had a PCN reaction causing severe rash involving mucus membranes or skin necrosis: No Has patient had a PCN reaction that required hospitalization No Has patient had a PCN reaction occurring within the last 10 years: No If all of the above answers are "NO", then may proceed with Cephalosporin use.  Marland Kitchen Keflex [Cephalexin] Diarrhea  . Penicillins Anaphylaxis and Other (See Comments)    Childhood allergy Has patient had a PCN reaction causing immediate rash, facial/tongue/throat swelling, SOB or lightheadedness with hypotension: Yes Has patient had a PCN reaction causing severe rash involving mucus membranes or skin necrosis: No Has patient had a PCN reaction that required hospitalization No Has patient had a PCN reaction occurring within the last 10 years: No If all of the above answers are "NO", then may proceed with  Cephalosporin use.   Marland Kitchen Phenytoin Other (See Comments)    Reaction:  CNS disorder   . Dilaudid [Hydromorphone Hcl] Other (See Comments)    Reaction:  Psychosis   . Haldol [Haloperidol] Other (See Comments)    Reaction:  Psychosis   . Morphine And Related Other (See Comments)    Reaction:  Psychosis      Vanc 11/14 >> LVQ 11/14 >> 11/15 Azactam 11/14 >> 11/15 Cefepime 11/15 >>  11/14 MRSA PCR - positive 11/14 TA - NGTD 11/14  BCx - NGTD   Rhet Rorke D. Mina Marble, PharmD, BCPS Pager:  [phone] 02/24/2017, 11:03 AM

## 2017-02-24 NOTE — Progress Notes (Signed)
Subjective: No significant changes  Exam: Vitals:   02/24/17 1600 02/24/17 1700  BP: 119/64 125/68  Pulse: 87 79  Resp: (!) 22 (!) 22  Temp: 98.1 F (36.7 C) 98.4 F (36.9 C)  SpO2: 94% 96%   Gen: In bed, NAD Resp: non-labored breathing, no acute distress Abd: soft, nt  Neuro: MS: Does not open eyes or follow commands CN: Peoples equal round and reactive, corneals intact Motor: She withdraws to noxious stimulation in all 4 extremities, though less in the left arm than right.  Sensory: As above  Impression: 66 year old female found down with episode of posturing versus seizure.  The EEG has a pattern which is most likely non-ictal, but which I do feel in the right clinical context could represent post-ictal changes.  With ehr elevated TSH, I think that myxedema coma has to be considered. Her EEG has shown some improvement, and will discontinue this today.  Overdose on neuroleptics I think could also give this picture, and I am uncertain  Recommendations: 1) Continue keppra and depakote 2) d/c EEG 3) Agree with thyroid supplementation 4) will follow.   Roland Rack, MD [facility] [phone]  If 7pm- 7am, please page neurology on call as listed in [place].

## 2017-02-24 NOTE — Progress Notes (Signed)
LTM discontinued. No skin break down noted. 

## 2017-02-24 NOTE — Procedures (Signed)
LTM-EEG Report  HISTORY: Continuous video-EEG monitoring performed for 66 year old with altered mental status, possible seizures.  ACQUISITION: International 10-20 system for electrode placement; 18 channels with additional eyes linked to ipsilateral ears and EKG. Additional T1-T2 electrodes were used. Continuous video recording obtained.   EEG NUMBER: MEDICATIONS:  Day 1: VPA, LZP, fentanyl Day 2: VPA, LEV, fentanyl  DAY #1: from 4709 02/22/17 to 0730 02/23/17  BACKGROUND: An overall medium voltage discontinuous recording with poor spontaneous variability and reactivity. The record consisted of medium voltage, sharp theta-delta bilaterally with no evidence of a posterior basic rhythm and sparse superimposed faster frequencies. This activity was interrupted by periods of diffuse voltage attenuation lasting 1-2 seconds, increasing during the late evening hours with increased medications. EPILEPTIFORM/PERIODIC ACTIVITY: none SEIZURES: none EVENTS: none   DAY #2: from 0730 02/23/17 to 0730 02/24/17  BACKGROUND: An overall medium voltage, mostly continuous recording with poor spontaneous variability and reactivity. The record consisted of medium voltage, theta-delta bilaterally with no evidence of a posterior basic rhythm and sparse superimposed faster frequencies. The activity was sharply contoured in the bifrontal regions at times during the early portion of this recording but resolved by the evening. Periods of discontinuity with waking were less apparent. State changes/sleep was captured with overall lower voltage, some mild discontinuity, and generalized 1-5hz  activity with some sleep spindles. There was an increase in discontinuity around 1600 and 0000, possibly related to medications. EPILEPTIFORM/PERIODIC ACTIVITY: none, however sharp theta activity seen at times in the early portion of the record SEIZURES: none EVENTS: none   EKG: no significant arrhythmia  SUMMARY: This was a  moderately abnormal continuous video EEG due to loss of normal background, diffuse sharply contoured slowing (resolved), and a partial burst-suppression pattern. This was indicative of an improving global cerebral disturbance. No seizures or definite epileptiform discharges were seen, however sharp activity was present early in the record.

## 2017-02-25 ENCOUNTER — Inpatient Hospital Stay (HOSPITAL_COMMUNITY): Payer: Medicare Other

## 2017-02-25 LAB — GLUCOSE, CAPILLARY
GLUCOSE-CAPILLARY: 169 mg/dL — AB (ref 65–99)
GLUCOSE-CAPILLARY: 142 mg/dL — AB (ref 65–99)
GLUCOSE-CAPILLARY: 163 mg/dL — AB (ref 65–99)
Glucose-Capillary: 158 mg/dL — ABNORMAL HIGH (ref 65–99)
Glucose-Capillary: 97 mg/dL (ref 65–99)

## 2017-02-25 LAB — CBC
HEMATOCRIT: 36 % (ref 36.0–46.0)
Hemoglobin: 12 g/dL (ref 12.0–15.0)
MCH: 31.3 pg (ref 26.0–34.0)
MCHC: 33.3 g/dL (ref 30.0–36.0)
MCV: 94 fL (ref 78.0–100.0)
PLATELETS: 340 10*3/uL (ref 150–400)
RBC: 3.83 MIL/uL — AB (ref 3.87–5.11)
RDW: 14.9 % (ref 11.5–15.5)
WBC: 12.2 10*3/uL — ABNORMAL HIGH (ref 4.0–10.5)

## 2017-02-25 LAB — BASIC METABOLIC PANEL
ANION GAP: 10 (ref 5–15)
Anion gap: 9 (ref 5–15)
BUN: 9 mg/dL (ref 6–20)
BUN: 12 mg/dL (ref 6–20)
CHLORIDE: 102 mmol/L (ref 101–111)
CHLORIDE: 104 mmol/L (ref 101–111)
CO2: 27 mmol/L (ref 22–32)
CO2: 26 mmol/L (ref 22–32)
CREATININE: 0.84 mg/dL (ref 0.44–1.00)
Calcium: 7.6 mg/dL — ABNORMAL LOW (ref 8.9–10.3)
Calcium: 7.8 mg/dL — ABNORMAL LOW (ref 8.9–10.3)
Creatinine, Ser: 0.91 mg/dL (ref 0.44–1.00)
GFR calc Af Amer: >60 mL/min (ref >60)
GFR calc Af Amer: >60 mL/min (ref >60)
GFR calc non Af Amer: >60 mL/min (ref >60)
GFR calc non Af Amer: >60 mL/min (ref >60)
Glucose, Bld: 157 mg/dL — ABNORMAL HIGH (ref 65–99)
Glucose, Bld: 152 mg/dL — ABNORMAL HIGH (ref 65–99)
POTASSIUM: 3.8 mmol/L (ref 3.5–5.1)
Potassium: 2.8 mmol/L — ABNORMAL LOW (ref 3.5–5.1)
SODIUM: 140 mmol/L (ref 135–145)
Sodium: 138 mmol/L (ref 135–145)

## 2017-02-25 LAB — TRIGLYCERIDES: Triglycerides: 171 mg/dL — ABNORMAL HIGH (ref <150)

## 2017-02-25 MED ORDER — POTASSIUM CHLORIDE 20 MEQ/15ML (10%) PO SOLN
40.0000 meq | ORAL | Status: AC
  Administered 2017-02-25 (×2): 40 meq
  Filled 2017-02-25 (×3): qty 30

## 2017-02-25 NOTE — Progress Notes (Signed)
Subjective: More awake today.   Exam: Vitals:   02/25/17 0830 02/25/17 0845  BP: (!) 111/58 125/88  Pulse: 99 (!) 117  Resp: 17 (!) 21  Temp: 98.2 F (36.8 C) 98.4 F (36.9 C)  SpO2: 91% 92%   Gen: In bed, NAD Resp: non-labored breathing, no acute distress Abd: soft, nt  Neuro: MS: Does not open eyes. When I ask her to wiggle her thumb, she wiggles all fingers on her right hand.  CN: Peoples equal round and reactive, corneals intact, attempts to fixate at  Motor: She withdraws to noxious stimulation in all 4 extremities, though less in the left arm than right.  Sensory: As above  Impression: 66 year old female found down with episode of posturing versus seizure.  The EEG had a pattern which is most likely non-ictal, but which I do feel in the right clinical context could represent post-ictal changes(e.g. "burned out status").  With her elevated TSH, I think that myxedema coma has to be considered. Her EEG has shown some improvement, and will discontinue this today.  Overdose on neuroleptics I think could also give this picture, and I am uncertain if this is possible, no answer when I tried to call family this morning.   Her moving her left arm less is concerning, if no improvement by tomorrow, would consider repeating MRI. Overall, however, her improvement is encouraging.   Recommendations: 1) Continue keppra and depakote 2) Agree with thyroid supplementation 3) will follow.   Roland Rack, MD [facility] [phone]  If 7pm- 7am, please page neurology on call as listed in [place].

## 2017-02-25 NOTE — Progress Notes (Signed)
PULMONARY / CRITICAL CARE MEDICINE   Name: Dominique Acosta MRN: [id number] DOB: [birth year]    ADMISSION DATE:  02/22/2017  REFERRING MD:  EDP  CHIEF COMPLAINT:  AMS  HISTORY OF PRESENT ILLNESS:   66 yo female former smoker called EMS and the found unresponsive with agonal respirations and hypothermia at home.  Required intubation for airway protection. PMHx of COPD, CAD, Depression, Schizophrenia, dystonia, seizures, chronic pain, substance abuse, HLD, Hypothyroidism.  SUBJECTIVE:  More alert.  Tolerating some pressure support.  VITAL SIGNS: BP (!) 107/59   Pulse (!) 105   Temp 98.6 F (37 C)   Resp 17   Ht 5\' 1"  (1.549 m)   Wt 189 lb 6 oz (85.9 kg)   SpO2 91%   BMI 35.78 kg/m   VENTILATOR SETTINGS: Vent Mode: PRVC FiO2 (%):  [40 %] 40 % Set Rate:  [18 bmp] 18 bmp Vt Set:  [420 mL] 420 mL PEEP:  [5 cmH20] 5 cmH20 Plateau Pressure:  [10 cmH20-19 cmH20] 11 cmH20  INTAKE / OUTPUT: I/O last 3 completed shifts: In: 7136.3 [I.V.:3053; NG/GT:1870; IV Piggyback:2213.3] Out: 3180 [Urine:3180]  PHYSICAL EXAMINATION:  General - sedated Eyes - pupils reactive ENT - no sinus tenderness, no oral exudate, no LAN Cardiac - regular, no murmur Chest - no wheeze, rales Abd - soft, non tender Ext - no edema Skin - no rashes Neuro - RASS -1, moves extremities, not following commands  LABS:  BMET Recent Labs  Lab 02/23/17 0900 02/24/17 0608 02/25/17 0454  NA 142 137 138  K 3.6 2.8* 2.8*  CL 108 104 102  CO2 24 25 27   BUN 11 11 9   CREATININE 1.31* 0.95 0.84  GLUCOSE 98 190* 157*    Electrolytes Recent Labs  Lab 02/23/17 0900 02/23/17 1522 02/24/17 0608 02/24/17 1716 02/25/17 0454  CALCIUM 7.1*  --  7.1*  --  7.6*  MG 1.9 1.7 1.8 2.2  --   PHOS 1.5* 1.3* 1.6* 2.1*  --     CBC Recent Labs  Lab 02/23/17 0900 02/24/17 0608 02/25/17 0454  WBC 8.9 11.4* 12.2*  HGB 12.2 11.7* 12.0  HCT 37.6 35.2* 36.0  PLT PLATELET CLUMPS NOTED ON SMEAR, COUNT APPEARS  ADEQUATE 333 340    Coag's Recent Labs  Lab 02/22/17 2029  APTT 28  INR 1.07    Sepsis Markers Recent Labs  Lab 02/22/17 1314 02/22/17 2029 02/22/17 2356 02/24/17 0608  LATICACIDVEN 0.73  --   --   --   PROCALCITON  --  <0.10 <0.10 <0.10    ABG Recent Labs  Lab 02/23/17 0500  PHART 7.361  PCO2ART 47.6  PO2ART 70.6*    Liver Enzymes Recent Labs  Lab 02/22/17 1256 02/24/17 0608  AST 29 23  ALT 28 20  ALKPHOS 62 58  BILITOT 1.2 0.6  ALBUMIN 3.1* 2.7*    Cardiac Enzymes Recent Labs  Lab 02/22/17 2029 02/22/17 2356  TROPONINI <0.03 <0.03    Glucose Recent Labs  Lab 02/24/17 1159 02/24/17 1545 02/24/17 2022 02/24/17 2359 02/25/17 0344 02/25/17 0839  GLUCAP 146* 124* 164* 164* 158* 169*    Lab Results  Component Value Date   TSH 51.322 (H) 02/23/2017     Imaging Dg Chest Port 1 View  Result Date: 02/25/2017 CLINICAL DATA:  Respiratory failure EXAM: PORTABLE CHEST 1 VIEW COMPARISON:  02/24/2017 FINDINGS: Support devices are stable. Low lung volumes with bibasilar and perihilar atelectasis. Heart is normal size. No visible significant effusions. IMPRESSION: Low lung  volumes, bibasilar atelectasis. Electronically Signed   By: Rolm Baptise M.D.   On: 02/25/2017 07:37   STUDIES:  CT head 11/14 >> no acute findings EEG 11/14 >> diffuse slowing and partial burst suppression pattern MRI brain 11/15 >> atrophy and small vessel disease  CULTURES: BC x 2 11/14>>> Trach asp 11/14 >> Legionella Ag 11/14 >> negative Pneumococcal Ag 11/14 >> negative  ANTIBIOTICS: vancomycin 11/14>>> Levaquin 11/14 >>> 11/15 Aztreonam 11/14>>> 11/15 Cefepime 11/15 >>   SIGNIFICANT EVENTS: 11/14 Admit, neurology consulted 11/15 continuous EEG  LINES/TUBES: ETT 11/14>>> Rt IJ CVL 11/15>>>  DISCUSSION: 66 yo with acute respiratory failure, altered mental status, and hypothermia.  She has hx of hypothyroidism, schizophrenia, chronic pain, and polysubstance  abuse.  It is uncertain whether she has been taking synthroid.  She also has aspiration pneumonia.  ASSESSMENT / PLAN:  Acute respiratory failure with hypoxia and compromised airway. Hx of COPD. - pressure support wean >> not ready for extubation yet - f/u CXR - scheduled BDs  Aspiration pneumonia. - has PCN allergy listed, but tolerated cephalosporins previously - day 4 of Abx >> narrow when cultures finalized  Hypothyroidism. Adrenal insufficiency >> cortisol 14.1 from 11/15. - continue synthroid and solu cortef  Acute metabolic encephalopathy >> has some improvement 11/17. Hx of depression, chronic pain, schizophrenia, polysubstance abuse, dystonia, seizures. - RASS goal 0 to -1 - AEDs per neurology - hold outpt neurontin, baclofen, ativan, restoril, cogentin, effexor  Hx of CAD, HLD. - ASA, lipitor  Hypokalemia, hypophosphatemia, hypomagnesemia. Acute renal failure from ATN >> resolved. - replace electrolytes - KVO IV fluids  DVT prophylaxis - SQ heparin SUP - protonix Nutrition - tube feeds Goals of care - DNR  D/w Dr. Leonel Ramsay.  CC time 33 minutes.  Chesley Mires, MD [facility] Pulmonary/Critical Care 02/25/2017, 11:08 AM Pager:  [phone] After 3pm call: [phone]

## 2017-02-25 NOTE — Progress Notes (Signed)
[facility] ADULT ICU REPLACEMENT PROTOCOL FOR AM LAB REPLACEMENT ONLY  The patient does apply for the [facility] Adult ICU Electrolyte Replacment Protocol based on the criteria listed below:   1. Is GFR >/= 40 ml/min? Yes.    Patient's GFR today is >60 2. Is urine output >/= 0.5 ml/kg/hr for the last 6 hours? Yes.   Patient's UOP is 0.7 ml/kg/hr 3. Is BUN < 60 mg/dL? Yes.    Patient's BUN today is 9 4. Abnormal electrolyte(s): K+2.8 5. Ordered repletion with: Protocol 6. If a panic level lab has been reported, has the CCM MD in charge been notified? Yes.  .   Physician:  Rozelle Logan, [place] 02/25/2017 6:08 AM

## 2017-02-26 ENCOUNTER — Inpatient Hospital Stay (HOSPITAL_COMMUNITY): Payer: Medicare Other

## 2017-02-26 LAB — CBC
HCT: 32.4 % — ABNORMAL LOW (ref 36.0–46.0)
HEMOGLOBIN: 10.8 g/dL — AB (ref 12.0–15.0)
MCH: 31.9 pg (ref 26.0–34.0)
MCHC: 33.3 g/dL (ref 30.0–36.0)
MCV: 95.6 fL (ref 78.0–100.0)
Platelets: 276 10*3/uL (ref 150–400)
RBC: 3.39 MIL/uL — ABNORMAL LOW (ref 3.87–5.11)
RDW: 15.2 % (ref 11.5–15.5)
WBC: 8.8 10*3/uL (ref 4.0–10.5)

## 2017-02-26 LAB — GLUCOSE, CAPILLARY
GLUCOSE-CAPILLARY: 104 mg/dL — AB (ref 65–99)
GLUCOSE-CAPILLARY: 121 mg/dL — AB (ref 65–99)
GLUCOSE-CAPILLARY: 131 mg/dL — AB (ref 65–99)
GLUCOSE-CAPILLARY: 144 mg/dL — AB (ref 65–99)
Glucose-Capillary: 129 mg/dL — ABNORMAL HIGH (ref 65–99)

## 2017-02-26 LAB — BASIC METABOLIC PANEL
Anion gap: 8 (ref 5–15)
BUN: 12 mg/dL (ref 6–20)
CO2: 28 mmol/L (ref 22–32)
CREATININE: 0.86 mg/dL (ref 0.44–1.00)
Calcium: 7.8 mg/dL — ABNORMAL LOW (ref 8.9–10.3)
Chloride: 104 mmol/L (ref 101–111)
GFR calc Af Amer: >60 mL/min (ref >60)
GLUCOSE: 146 mg/dL — AB (ref 65–99)
POTASSIUM: 3.7 mmol/L (ref 3.5–5.1)
Sodium: 140 mmol/L (ref 135–145)

## 2017-02-26 LAB — VANCOMYCIN, TROUGH: VANCOMYCIN TR: 20 ug/mL (ref 15–20)

## 2017-02-26 NOTE — Progress Notes (Signed)
Subjective: Continues to be more awake  Exam: Vitals:   02/26/17 0720 02/26/17 0816  BP:  (!) 137/59  Pulse: 80 (!) 6  Resp: 20 (!) 22  Temp: 98.1 F (36.7 C)   SpO2: 98% 98%   Gen: In bed, intubated Resp: Ventilated Abd: soft, nt  Neuro: MS: Opens eyes spontaneously.  She wiggles her toes, and fingers to command on the right CN: Pupils equal round and reactive, corneals intact, she fixates and tracks across midline in both directions Motor: She moves her left arm significantly less than her right, she withdraws bilateral lower extremities.  She has some tardive appearing movements of her mouth Sensory: As above  Impression: 66 year old female found down with episode of posturing versus seizure.  The EEG had a pattern which is most likely non-ictal, but which I do feel in the right clinical context could represent post-ictal changes(e.g. "burned out status") this improved with time. With her elevated TSH, I think that myxedema coma has to be considered.   Overdose on neuroleptics I think could also give this picture.   She is not moving her left arm very much, and I will repeat imaging due to this.  Overall, however, she is improving.  Recommendations: 1) Continue keppra and depakote 2) Agree with thyroid supplementation 3) MRI brain and cervical spine  Roland Rack, MD [facility] [phone]  If 7pm- 7am, please page neurology on call as listed in [place].

## 2017-02-26 NOTE — Progress Notes (Signed)
Pharmacy Antibiotic Note  Dominique Acosta is a 66 y.o. female admitted on 02/22/2017 with sepsis, TA growing staph aureus, currently on vancomycin day #5 and cefepime day #4.  Patient's renal function is stable 0.86, good UOP.  She has intermittent hypothermia and her WBC are WNL.  Vancomycin trough is therapeutic at 20 - drawn ~50 minutes early.   Plan: Continue vanc 750mg  IV Q12H Change cefepime 2gm IV Q12H Monitor renal fxn, micro data, repeat vanc trough as indicated F/u susceptibilities and ability to de-escalate as able   Height: 5\' 1"  (154.9 cm) Weight: 193 lb 2 oz (87.6 kg) IBW/kg (Calculated) : 47.8  Temp (24hrs), Avg:98.4 F (36.9 C), Min:97.5 F (36.4 C), Max:99.3 F (37.4 C)  Recent Labs  Lab 02/22/17 1256 02/22/17 1314 02/23/17 0900 02/24/17 0608 02/25/17 0454 02/25/17 2136 02/26/17 0429 02/26/17 1541  WBC 10.0  --  8.9 11.4* 12.2*  --  8.8  --   CREATININE 1.68*  --  1.31* 0.95 0.84 0.91 0.86  --   LATICACIDVEN  --  0.73  --   --   --   --   --   --   VANCOTROUGH  --   --   --   --   --   --   --  20    Estimated Creatinine Clearance: 63.8 mL/min (by C-G formula based on SCr of 0.86 mg/dL).    Allergies  Allergen Reactions  . Amoxicillin Anaphylaxis and Other (See Comments)    Childhood allergy >> tolerated cephalosporins  Has patient had a PCN reaction causing immediate rash, facial/tongue/throat swelling, SOB or lightheadedness with hypotension: Yes Has patient had a PCN reaction causing severe rash involving mucus membranes or skin necrosis: No Has patient had a PCN reaction that required hospitalization No Has patient had a PCN reaction occurring within the last 10 years: No If all of the above answers are "NO", then may proceed with Cephalosporin use.  Marland Kitchen Keflex [Cephalexin] Diarrhea  . Penicillins Anaphylaxis and Other (See Comments)    Childhood allergy Has patient had a PCN reaction causing immediate rash, facial/tongue/throat swelling, SOB or  lightheadedness with hypotension: Yes Has patient had a PCN reaction causing severe rash involving mucus membranes or skin necrosis: No Has patient had a PCN reaction that required hospitalization No Has patient had a PCN reaction occurring within the last 10 years: No If all of the above answers are "NO", then may proceed with Cephalosporin use.   Marland Kitchen Phenytoin Other (See Comments)    Reaction:  CNS disorder   . Dilaudid [Hydromorphone Hcl] Other (See Comments)    Reaction:  Psychosis   . Haldol [Haloperidol] Other (See Comments)    Reaction:  Psychosis   . Morphine And Related Other (See Comments)    Reaction:  Psychosis      Vanc 11/14 >> LVQ 11/14 >> 11/15 Azactam 11/14 >> 11/15 Cefepime 11/15 >>  11/14 MRSA PCR - positive 11/14 TA - rare SA 11/14  BCx - NGTD   [name], PharmD, BCPS Clinical Pharmacist 02/26/2017 4:35 PM

## 2017-02-26 NOTE — Progress Notes (Signed)
RT transported patient to and from MRI on vent. No no apparent complications or signs of distress noted. Patient is now back on full support. RT will continue to monitor.

## 2017-02-26 NOTE — Progress Notes (Signed)
PULMONARY / CRITICAL CARE MEDICINE   Name: Lusia Greis MRN: [id number] DOB: Jun 12, 1950    ADMISSION DATE:  02/22/2017  REFERRING MD:  EDP  CHIEF COMPLAINT:  AMS  HISTORY OF PRESENT ILLNESS:   66 yo female former smoker called EMS and the found unresponsive with agonal respirations and hypothermia at home.  Required intubation for airway protection. PMHx of COPD, CAD, Depression, Schizophrenia, dystonia, seizures, chronic pain, substance abuse, HLD, Hypothyroidism.  SUBJECTIVE:  Went for MRI this morning.  VITAL SIGNS: BP 111/68   Pulse 86   Temp 98.6 F (37 C)   Resp (!) 21   Ht 5\' 1"  (1.549 m)   Wt 193 lb 2 oz (87.6 kg)   SpO2 100%   BMI 36.49 kg/m   VENTILATOR SETTINGS: Vent Mode: PRVC FiO2 (%):  [40 %] 40 % Set Rate:  [18 bmp] 18 bmp Vt Set:  [420 mL] 420 mL PEEP:  [5 cmH20] 5 cmH20 Pressure Support:  [10 cmH20] 10 cmH20 Plateau Pressure:  [15 cmH20-19 cmH20] 19 cmH20  INTAKE / OUTPUT: I/O last 3 completed shifts: In: 4686.6 [I.V.:1761.6; NG/GT:1720; IV Piggyback:1205] Out: 2360 [Urine:2260; Emesis/NG output:100]  PHYSICAL EXAMINATION:  General - sedated Eyes - pupils reactive ENT - ETT in place Cardiac - regular, no murmur Chest - no wheeze, rales Abd - soft, non tender Ext - no edema Skin - no rashes Neuro - RASS -2   LABS:  BMET Recent Labs  Lab 02/25/17 0454 02/25/17 2136 02/26/17 0429  NA 138 140 140  K 2.8* 3.8 3.7  CL 102 104 104  CO2 27 26 28   BUN 9 12 12   CREATININE 0.84 0.91 0.86  GLUCOSE 157* 152* 146*    Electrolytes Recent Labs  Lab 02/23/17 1522 02/24/17 0608 02/24/17 1716 02/25/17 0454 02/25/17 2136 02/26/17 0429  CALCIUM  --  7.1*  --  7.6* 7.8* 7.8*  MG 1.7 1.8 2.2  --   --   --   PHOS 1.3* 1.6* 2.1*  --   --   --     CBC Recent Labs  Lab 02/24/17 0608 02/25/17 0454 02/26/17 0429  WBC 11.4* 12.2* 8.8  HGB 11.7* 12.0 10.8*  HCT 35.2* 36.0 32.4*  PLT 333 340 276    Coag's Recent Labs  Lab  02/22/17 2029  APTT 28  INR 1.07    Sepsis Markers Recent Labs  Lab 02/22/17 1314 02/22/17 2029 02/22/17 2356 02/24/17 0608  LATICACIDVEN 0.73  --   --   --   PROCALCITON  --  <0.10 <0.10 <0.10    ABG Recent Labs  Lab 02/23/17 0500  PHART 7.361  PCO2ART 47.6  PO2ART 70.6*    Liver Enzymes Recent Labs  Lab 02/22/17 1256 02/24/17 0608  AST 29 23  ALT 28 20  ALKPHOS 62 58  BILITOT 1.2 0.6  ALBUMIN 3.1* 2.7*    Cardiac Enzymes Recent Labs  Lab 02/22/17 2029 02/22/17 2356  TROPONINI <0.03 <0.03    Glucose Recent Labs  Lab 02/25/17 1210 02/25/17 1644 02/25/17 1921 02/26/17 0035 02/26/17 0329 02/26/17 0909  GLUCAP 142* 97 163* 144* 129* 131*    Lab Results  Component Value Date   TSH 51.322 (H) 02/23/2017     Imaging Dg Chest Port 1 View  Result Date: 02/26/2017 CLINICAL DATA:  Respiratory failure, routine. EXAM: PORTABLE CHEST 1 VIEW COMPARISON:  One-view chest x-ray 02/25/2017. FINDINGS: Endotracheal tube terminates 2 cm above the carina and could be pulled back for more optimal  positioning. The right hemidiaphragm is now elevated. Interstitial edema is similar to the prior exam. Aeration at the left base is improved. IMPRESSION: 1. Endotracheal tube terminates 2 cm above the carina and could be pulled back 1-2 cm for more optimal positioning. 2. Persistent low lung volumes with increased elevation of the right hemidiaphragm. Unknown etiology. 3. Improving aeration of both lung bases. Electronically Signed   By: San Morelle M.D.   On: 02/26/2017 08:31   STUDIES:  CT head 11/14 >> no acute findings EEG 11/14 >> diffuse slowing and partial burst suppression pattern MRI brain 11/15 >> atrophy and small vessel disease  CULTURES: BC x 2 11/14>>> Trach asp 11/14 >> Few Staph aureus >>  Legionella Ag 11/14 >> negative Pneumococcal Ag 11/14 >> negative  ANTIBIOTICS: vancomycin 11/14>>> Levaquin 11/14 >>> 11/15 Aztreonam 11/14>>>  11/15 Cefepime 11/15 >> 11/18  SIGNIFICANT EVENTS: 11/14 Admit, neurology consulted 11/15 continuous EEG  LINES/TUBES: ETT 11/14>>> Rt IJ CVL 11/15>>>  DISCUSSION: 66 yo with acute respiratory failure, altered mental status, and hypothermia.  She has hx of hypothyroidism, schizophrenia, chronic pain, and polysubstance abuse.  It is uncertain whether she has been taking synthroid.  She also has aspiration pneumonia.  ASSESSMENT / PLAN:  Acute respiratory failure with hypoxia and compromised airway. Hx of COPD. - pressure support wean >> mental status barrier to extubation - f/u CXR - scheduled BDs  Aspiration pneumonia with Staph aureus in sputum. - has PCN allergy listed, but tolerated cephalosporins previously - day 5 of Abx >> d/c cefepime and continue vancomycin  Hypothyroidism. Adrenal insufficiency >> cortisol 14.1 from 11/15. - continue synthroid and solu cortef  Acute metabolic encephalopathy >> has some improvement 11/17. Hx of depression, chronic pain, schizophrenia, polysubstance abuse, dystonia, seizures. - RASS goal 0 to -1 - AEDs per neurology - f/u MRI brain - hold outpt neurontin, baclofen, ativan, restoril, cogentin, effexor  Hx of CAD, HLD. - ASA, lipitor  Hypokalemia, hypophosphatemia, hypomagnesemia. Acute renal failure from ATN >> resolved. - replace electrolytes - KVO IV fluids  DVT prophylaxis - SQ heparin SUP - protonix Nutrition - tube feeds Goals of care - DNR  D/w Dr. Leonel Ramsay.  CC time 31 minutes  Chesley Mires, MD [place] 02/26/2017, 2:53 PM Pager:  [phone] After 3pm call: [phone]

## 2017-02-27 ENCOUNTER — Inpatient Hospital Stay (HOSPITAL_COMMUNITY): Payer: Medicare Other

## 2017-02-27 LAB — CBC
HEMATOCRIT: 30.4 % — AB (ref 36.0–46.0)
HEMOGLOBIN: 10.4 g/dL — AB (ref 12.0–15.0)
MCH: 34.4 pg — ABNORMAL HIGH (ref 26.0–34.0)
MCHC: 34.2 g/dL (ref 30.0–36.0)
MCV: 100.7 fL — ABNORMAL HIGH (ref 78.0–100.0)
Platelets: 275 10*3/uL (ref 150–400)
RBC: 3.02 MIL/uL — AB (ref 3.87–5.11)
RDW: 15.6 % — ABNORMAL HIGH (ref 11.5–15.5)
WBC: 6.9 10*3/uL (ref 4.0–10.5)

## 2017-02-27 LAB — GLUCOSE, CAPILLARY
GLUCOSE-CAPILLARY: 123 mg/dL — AB (ref 65–99)
GLUCOSE-CAPILLARY: 130 mg/dL — AB (ref 65–99)
GLUCOSE-CAPILLARY: 132 mg/dL — AB (ref 65–99)
Glucose-Capillary: 113 mg/dL — ABNORMAL HIGH (ref 65–99)
Glucose-Capillary: 115 mg/dL — ABNORMAL HIGH (ref 65–99)
Glucose-Capillary: 119 mg/dL — ABNORMAL HIGH (ref 65–99)

## 2017-02-27 LAB — BASIC METABOLIC PANEL
Anion gap: 8 (ref 5–15)
BUN: 13 mg/dL (ref 6–20)
CO2: 31 mmol/L (ref 22–32)
Calcium: 8.3 mg/dL — ABNORMAL LOW (ref 8.9–10.3)
Chloride: 103 mmol/L (ref 101–111)
Creatinine, Ser: 0.8 mg/dL (ref 0.44–1.00)
GLUCOSE: 143 mg/dL — AB (ref 65–99)
Potassium: 3.8 mmol/L (ref 3.5–5.1)
Sodium: 142 mmol/L (ref 135–145)

## 2017-02-27 LAB — CULTURE, RESPIRATORY W GRAM STAIN

## 2017-02-27 LAB — CULTURE, BLOOD (ROUTINE X 2)
Culture: NO GROWTH
Special Requests: ADEQUATE

## 2017-02-27 LAB — VITAMIN B12: Vitamin B-12: 484 pg/mL (ref 180–914)

## 2017-02-27 LAB — CULTURE, RESPIRATORY

## 2017-02-27 LAB — T4, FREE: FREE T4: 0.24 ng/dL — AB (ref 0.61–1.12)

## 2017-02-27 MED ORDER — FUROSEMIDE 10 MG/ML IJ SOLN
40.0000 mg | Freq: Once | INTRAMUSCULAR | Status: AC
  Administered 2017-02-27: 40 mg via INTRAVENOUS
  Filled 2017-02-27: qty 4

## 2017-02-27 MED ORDER — LEVOTHYROXINE SODIUM 100 MCG IV SOLR
50.0000 ug | Freq: Every day | INTRAVENOUS | Status: DC
  Administered 2017-02-28 – 2017-03-11 (×12): 50 ug via INTRAVENOUS
  Filled 2017-02-27 (×12): qty 5

## 2017-02-27 MED ORDER — FOLIC ACID 5 MG/ML IJ SOLN
1.0000 mg | Freq: Every day | INTRAMUSCULAR | Status: DC
  Administered 2017-02-27 – 2017-03-01 (×3): 1 mg via INTRAVENOUS
  Filled 2017-02-27 (×3): qty 0.2

## 2017-02-27 MED ORDER — THIAMINE HCL 100 MG/ML IJ SOLN
100.0000 mg | Freq: Every day | INTRAMUSCULAR | Status: DC
  Administered 2017-02-27 – 2017-03-01 (×3): 100 mg via INTRAVENOUS
  Filled 2017-02-27 (×3): qty 1

## 2017-02-27 MED ORDER — POTASSIUM CHLORIDE 20 MEQ/15ML (10%) PO SOLN
40.0000 meq | Freq: Once | ORAL | Status: AC
  Administered 2017-02-27: 40 meq
  Filled 2017-02-27: qty 30

## 2017-02-27 MED ORDER — HYDROCORTISONE NA SUCCINATE PF 100 MG IJ SOLR
50.0000 mg | Freq: Three times a day (TID) | INTRAMUSCULAR | Status: DC
  Administered 2017-02-27 – 2017-02-28 (×2): 50 mg via INTRAVENOUS
  Filled 2017-02-27 (×3): qty 1

## 2017-02-27 NOTE — Progress Notes (Addendum)
Subjective: 66 yo female former smoker presented via EMS with AMS. She initially alerted EMS on her own, but was found by EMS to be unresponsive with agonal respirations and hypothermia at home.  She required intubation for airway protection. She has a PMHx of COPD, CAD, Depression, Schizophrenia, dystonia, seizures (but not on seizure medications at home), chronic pain, substance abuse, HLD, Hypothyroidism.   Today, she continues to exhibit symptoms of encephalopathy. Notes document gradual improvement during this admission.   Objective: Current vital signs: BP (!) 84/43   Pulse 74   Temp 98.1 F (36.7 C)   Resp 18   Ht '5\' 1"'$  (1.549 m)   Wt 88 kg (194 lb 0.1 oz)   SpO2 97%   BMI 36.66 kg/m  Vital signs in last 24 hours: Temp:  [96.4 F (35.8 C)-99 F (37.2 C)] 98.1 F (36.7 C) (11/19 0700) Pulse Rate:  [6-96] 74 (11/19 0715) Resp:  [1-22] 18 (11/19 0715) BP: (72-137)/(39-68) 84/43 (11/19 0715) SpO2:  [90 %-100 %] 97 % (11/19 0715) FiO2 (%):  [40 %] 40 % (11/19 0715) Weight:  [88 kg (194 lb 0.1 oz)] 88 kg (194 lb 0.1 oz) (11/19 0244)  Intake/Output from previous day: 11/18 0701 - 11/19 0700 In: 2629.1 [I.V.:841.1; NG/GT:1053; IV Piggyback:735] Out: 1400 [Urine:1400] Intake/Output this shift: No intake/output data recorded. Nutritional status: Diet NPO time specified  Neurologic Exam: Ment: Eyes open spontaneously. Tremulous nonpurposeful movements seen intermittently. Not following commands. No attempts to communicate. Occasionally will track visual stimuli.  CN: Pupils equal at 4 mm. Roving EOM are conjugate without gaze preference or nystagmus. Face symmetric. Tardive dyskinesia involving jaw and perioral muscles is noted intermittently.  Motor/Sensory: Does not move to command. Withdraws to noxious stimuli.   Lab Results: Results for orders placed or performed during the hospital encounter of 02/22/17 (from the past 48 hour(s))  Glucose, capillary     Status: Abnormal    Collection Time: 02/25/17  8:39 AM  Result Value Ref Range   Glucose-Capillary 169 (H) 65 - 99 mg/dL   Comment 1 Capillary Specimen    Comment 2 Notify RN   Glucose, capillary     Status: Abnormal   Collection Time: 02/25/17 12:10 PM  Result Value Ref Range   Glucose-Capillary 142 (H) 65 - 99 mg/dL   Comment 1 Capillary Specimen    Comment 2 Notify RN   Glucose, capillary     Status: None   Collection Time: 02/25/17  4:44 PM  Result Value Ref Range   Glucose-Capillary 97 65 - 99 mg/dL   Comment 1 Capillary Specimen    Comment 2 Notify RN   Glucose, capillary     Status: Abnormal   Collection Time: 02/25/17  7:21 PM  Result Value Ref Range   Glucose-Capillary 163 (H) 65 - 99 mg/dL   Comment 1 Notify RN   Triglycerides     Status: Abnormal   Collection Time: 02/25/17  7:47 PM  Result Value Ref Range   Triglycerides 171 (H) <150 mg/dL  BMET today at 2000     Status: Abnormal   Collection Time: 02/25/17  9:36 PM  Result Value Ref Range   Sodium 140 135 - 145 mmol/L   Potassium 3.8 3.5 - 5.1 mmol/L    Comment: DELTA CHECK NOTED   Chloride 104 101 - 111 mmol/L   CO2 26 22 - 32 mmol/L   Glucose, Bld 152 (H) 65 - 99 mg/dL   BUN 12 6 - 20  mg/dL   Creatinine, Ser 0.91 0.44 - 1.00 mg/dL   Calcium 7.8 (L) 8.9 - 10.3 mg/dL   GFR calc non Af Amer >60 >60 mL/min   GFR calc Af Amer >60 >60 mL/min    Comment: (NOTE) The eGFR has been calculated using the CKD EPI equation. This calculation has not been validated in all clinical situations. eGFR's persistently <60 mL/min signify possible Chronic Kidney Disease.    Anion gap 10 5 - 15  Glucose, capillary     Status: Abnormal   Collection Time: 02/26/17 12:35 AM  Result Value Ref Range   Glucose-Capillary 144 (H) 65 - 99 mg/dL   Comment 1 Notify RN   Glucose, capillary     Status: Abnormal   Collection Time: 02/26/17  3:29 AM  Result Value Ref Range   Glucose-Capillary 129 (H) 65 - 99 mg/dL   Comment 1 Notify RN   Basic  metabolic panel     Status: Abnormal   Collection Time: 02/26/17  4:29 AM  Result Value Ref Range   Sodium 140 135 - 145 mmol/L   Potassium 3.7 3.5 - 5.1 mmol/L   Chloride 104 101 - 111 mmol/L   CO2 28 22 - 32 mmol/L   Glucose, Bld 146 (H) 65 - 99 mg/dL   BUN 12 6 - 20 mg/dL   Creatinine, Ser 0.86 0.44 - 1.00 mg/dL   Calcium 7.8 (L) 8.9 - 10.3 mg/dL   GFR calc non Af Amer >60 >60 mL/min   GFR calc Af Amer >60 >60 mL/min    Comment: (NOTE) The eGFR has been calculated using the CKD EPI equation. This calculation has not been validated in all clinical situations. eGFR's persistently <60 mL/min signify possible Chronic Kidney Disease.    Anion gap 8 5 - 15  CBC     Status: Abnormal   Collection Time: 02/26/17  4:29 AM  Result Value Ref Range   WBC 8.8 4.0 - 10.5 K/uL   RBC 3.39 (L) 3.87 - 5.11 MIL/uL   Hemoglobin 10.8 (L) 12.0 - 15.0 g/dL   HCT 32.4 (L) 36.0 - 46.0 %   MCV 95.6 78.0 - 100.0 fL   MCH 31.9 26.0 - 34.0 pg   MCHC 33.3 30.0 - 36.0 g/dL   RDW 15.2 11.5 - 15.5 %   Platelets 276 150 - 400 K/uL  Glucose, capillary     Status: Abnormal   Collection Time: 02/26/17  9:09 AM  Result Value Ref Range   Glucose-Capillary 131 (H) 65 - 99 mg/dL   Comment 1 Capillary Specimen    Comment 2 Notify RN   Glucose, capillary     Status: Abnormal   Collection Time: 02/26/17  3:33 PM  Result Value Ref Range   Glucose-Capillary 121 (H) 65 - 99 mg/dL   Comment 1 Capillary Specimen   Vancomycin, trough     Status: None   Collection Time: 02/26/17  3:41 PM  Result Value Ref Range   Vancomycin Tr 20 15 - 20 ug/mL  Glucose, capillary     Status: Abnormal   Collection Time: 02/26/17  7:06 PM  Result Value Ref Range   Glucose-Capillary 104 (H) 65 - 99 mg/dL   Comment 1 Notify RN   Glucose, capillary     Status: Abnormal   Collection Time: 02/27/17  1:10 AM  Result Value Ref Range   Glucose-Capillary 123 (H) 65 - 99 mg/dL   Comment 1 Notify RN   Glucose, capillary  Status:  Abnormal   Collection Time: 02/27/17  3:32 AM  Result Value Ref Range   Glucose-Capillary 130 (H) 65 - 99 mg/dL   Comment 1 Notify RN   Basic metabolic panel     Status: Abnormal   Collection Time: 02/27/17  5:05 AM  Result Value Ref Range   Sodium 142 135 - 145 mmol/L   Potassium 3.8 3.5 - 5.1 mmol/L   Chloride 103 101 - 111 mmol/L   CO2 31 22 - 32 mmol/L   Glucose, Bld 143 (H) 65 - 99 mg/dL   BUN 13 6 - 20 mg/dL   Creatinine, Ser 0.80 0.44 - 1.00 mg/dL   Calcium 8.3 (L) 8.9 - 10.3 mg/dL   GFR calc non Af Amer >60 >60 mL/min   GFR calc Af Amer >60 >60 mL/min    Comment: (NOTE) The eGFR has been calculated using the CKD EPI equation. This calculation has not been validated in all clinical situations. eGFR's persistently <60 mL/min signify possible Chronic Kidney Disease.    Anion gap 8 5 - 15  CBC     Status: Abnormal   Collection Time: 02/27/17  5:05 AM  Result Value Ref Range   WBC 6.9 4.0 - 10.5 K/uL   RBC 3.02 (L) 3.87 - 5.11 MIL/uL   Hemoglobin 10.4 (L) 12.0 - 15.0 g/dL   HCT 30.4 (L) 36.0 - 46.0 %   MCV 100.7 (H) 78.0 - 100.0 fL   MCH 34.4 (H) 26.0 - 34.0 pg   MCHC 34.2 30.0 - 36.0 g/dL   RDW 15.6 (H) 11.5 - 15.5 %   Platelets 275 150 - 400 K/uL    Recent Results (from the past 240 hour(s))  Blood Culture (routine x 2)     Status: None (Preliminary result)   Collection Time: 02/22/17 12:56 PM  Result Value Ref Range Status   Specimen Description BLOOD RIGHT PICC LINE  Final   Special Requests IN PEDIATRIC BOTTLE Blood Culture adequate volume  Final   Culture NO GROWTH 4 DAYS  Final   Report Status PENDING  Incomplete  Culture, respiratory (NON-Expectorated)     Status: None (Preliminary result)   Collection Time: 02/22/17  5:31 PM  Result Value Ref Range Status   Specimen Description TRACHEAL ASPIRATE  Final   Special Requests NONE  Final   Gram Stain   Final    ABUNDANT WBC PRESENT,BOTH PMN AND MONONUCLEAR NO ORGANISMS SEEN    Culture   Final    RARE  STAPHYLOCOCCUS AUREUS SUSCEPTIBILITIES TO FOLLOW    Report Status PENDING  Incomplete  Blood Culture (routine x 2)     Status: None (Preliminary result)   Collection Time: 02/22/17  8:40 PM  Result Value Ref Range Status   Specimen Description BLOOD RIGHT HAND  Final   Special Requests IN PEDIATRIC BOTTLE Blood Culture adequate volume  Final   Culture NO GROWTH 3 DAYS  Final   Report Status PENDING  Incomplete  MRSA PCR Screening     Status: Abnormal   Collection Time: 02/22/17  9:09 PM  Result Value Ref Range Status   MRSA by PCR POSITIVE (A) NEGATIVE Final    Comment:        The GeneXpert MRSA Assay (FDA approved for NASAL specimens only), is one component of a comprehensive MRSA colonization surveillance program. It is not intended to diagnose MRSA infection nor to guide or monitor treatment for MRSA infections. RESULT CALLED TO, READ BACK BY AND VERIFIED  WITH: RN Loletha Grayer [place] [id number] MLM     Lipid Panel Recent Labs    02/25/17 1947  TRIG 171*    Studies/Results: Mr Brain Wo Contrast  Result Date: 02/26/2017 CLINICAL DATA:  Subacute neuro deficit. Found down with posturing versus seizure. Negative brain MRI, but not moving left arm very much. EXAM: MRI HEAD WITHOUT CONTRAST MRI CERVICAL SPINE WITHOUT CONTRAST TECHNIQUE: Multiplanar, multiecho pulse sequences of the brain and surrounding structures, and cervical spine, to include the craniocervical junction and cervicothoracic junction, were obtained without intravenous contrast. COMPARISON:  Brain MRI from 3 days ago FINDINGS: MRI HEAD FINDINGS Brain: No acute infarction, hemorrhage, hydrocephalus, extra-axial collection or mass lesion. Minor periventricular FLAIR hyperintensity, usually microvascular ischemic. No focal finding to explain arm symptoms. Vascular: Major flow voids are preserved Skull and upper cervical spine: Negative for marrow lesion. Sinuses/Orbits: Small nasopharyngeal cysts. There is chronic sinusitis  with diffuse mucosal thickening and sphenoid sinus fluid levels. Progression of bilateral mastoid opacification. Nasopharyngeal fluid is present in the setting of intubation. MRI CERVICAL SPINE FINDINGS Alignment: Normal Vertebrae: No fracture or evidence of aggressive bone lesion. Mild T2 hyperintensity in the C7-T1 disc. Inflammation described below is distant from this level and there is no suspected discitis. Cord: Normal signal and morphology. Posterior Fossa, vertebral arteries, paraspinal tissues: There is extensive edema in the bilateral neck. On axial acquisition both submandibular glands appear expanded and with edematous septate. Fluid tracks around the central compartment and lateral neck ventral to the manubrium. There is a retropharyngeal effusion/collection from C1 to C4 with mass effect on the pharynx. Negative upper mediastinum. These results were called by telephone at the time of interpretation on 02/26/2017 at 6:18 pm to Dr. Roland Rack , who verbally acknowledged these results. Disc levels: C2-3: Mild bilateral facet spurring.  No impingement C3-4: Right more than left facet spurring that is advanced. Mild right uncovertebral spurring. Advanced right foraminal stenosis. Patent spinal canal C4-5: Facet arthropathy. Asymmetric left uncovertebral spurring. Moderate left foraminal narrowing. C5-6: Degenerative disc disease with narrowing and uncovertebral spurring. Bilateral foraminal impingement. Spinal stenosis with posterior disc osteophyte complex and ligamentum flavum thickening causing slight cord flattening. C6-7: Degenerative disc narrowing and spondylosis. Uncovertebral spurs cause mild left and borderline moderate right foraminal narrowing. Partial ventral subarachnoid space effacement. C7-T1:Spondylosis.  No impingement IMPRESSION: Brain MRI: 1. Stable and without explanation for symptoms. 2. Mild periventricular white matter disease. 3. Sinusitis with ethmoid and sphenoid fluid  levels, also seen on prior. Bilateral partial mastoid opacification. Cervical MRI: 1. Prominent edema in the bilateral neck with retropharyngeal collection. The submandibular glands appears thickened and edematous and this may be submandibular sialadenitis (bilaterality suggesting viral/systemic process). Suppurative infection is not excluded. 2. No acute finding to explain left arm deficit. 3. Disc and facet degeneration as described. Spinal stenosis with mild cord flattening at C5-6. Foraminal narrowings noted above. Electronically Signed   By: Monte Fantasia M.D.   On: 02/26/2017 18:30   Mr Cervical Spine Wo Contrast  Result Date: 02/26/2017 CLINICAL DATA:  Subacute neuro deficit. Found down with posturing versus seizure. Negative brain MRI, but not moving left arm very much. EXAM: MRI HEAD WITHOUT CONTRAST MRI CERVICAL SPINE WITHOUT CONTRAST TECHNIQUE: Multiplanar, multiecho pulse sequences of the brain and surrounding structures, and cervical spine, to include the craniocervical junction and cervicothoracic junction, were obtained without intravenous contrast. COMPARISON:  Brain MRI from 3 days ago FINDINGS: MRI HEAD FINDINGS Brain: No acute infarction, hemorrhage, hydrocephalus, extra-axial collection or mass lesion. Minor  periventricular FLAIR hyperintensity, usually microvascular ischemic. No focal finding to explain arm symptoms. Vascular: Major flow voids are preserved Skull and upper cervical spine: Negative for marrow lesion. Sinuses/Orbits: Small nasopharyngeal cysts. There is chronic sinusitis with diffuse mucosal thickening and sphenoid sinus fluid levels. Progression of bilateral mastoid opacification. Nasopharyngeal fluid is present in the setting of intubation. MRI CERVICAL SPINE FINDINGS Alignment: Normal Vertebrae: No fracture or evidence of aggressive bone lesion. Mild T2 hyperintensity in the C7-T1 disc. Inflammation described below is distant from this level and there is no suspected  discitis. Cord: Normal signal and morphology. Posterior Fossa, vertebral arteries, paraspinal tissues: There is extensive edema in the bilateral neck. On axial acquisition both submandibular glands appear expanded and with edematous septate. Fluid tracks around the central compartment and lateral neck ventral to the manubrium. There is a retropharyngeal effusion/collection from C1 to C4 with mass effect on the pharynx. Negative upper mediastinum. These results were called by telephone at the time of interpretation on 02/26/2017 at 6:18 pm to Dr. Roland Rack , who verbally acknowledged these results. Disc levels: C2-3: Mild bilateral facet spurring.  No impingement C3-4: Right more than left facet spurring that is advanced. Mild right uncovertebral spurring. Advanced right foraminal stenosis. Patent spinal canal C4-5: Facet arthropathy. Asymmetric left uncovertebral spurring. Moderate left foraminal narrowing. C5-6: Degenerative disc disease with narrowing and uncovertebral spurring. Bilateral foraminal impingement. Spinal stenosis with posterior disc osteophyte complex and ligamentum flavum thickening causing slight cord flattening. C6-7: Degenerative disc narrowing and spondylosis. Uncovertebral spurs cause mild left and borderline moderate right foraminal narrowing. Partial ventral subarachnoid space effacement. C7-T1:Spondylosis.  No impingement IMPRESSION: Brain MRI: 1. Stable and without explanation for symptoms. 2. Mild periventricular white matter disease. 3. Sinusitis with ethmoid and sphenoid fluid levels, also seen on prior. Bilateral partial mastoid opacification. Cervical MRI: 1. Prominent edema in the bilateral neck with retropharyngeal collection. The submandibular glands appears thickened and edematous and this may be submandibular sialadenitis (bilaterality suggesting viral/systemic process). Suppurative infection is not excluded. 2. No acute finding to explain left arm deficit. 3. Disc and  facet degeneration as described. Spinal stenosis with mild cord flattening at C5-6. Foraminal narrowings noted above. Electronically Signed   By: Monte Fantasia M.D.   On: 02/26/2017 18:30   Dg Chest Port 1 View  Result Date: 02/27/2017 CLINICAL DATA:  Hypoxia EXAM: PORTABLE CHEST 1 VIEW COMPARISON:  February 26, 2017 FINDINGS: Endotracheal tube tip is 1.9 cm above the carina. Nasogastric to tip and side port in stomach. Central catheter tip is in the superior vena cava near the cavoatrial junction. No pneumothorax. There is patchy airspace opacity in the left base, similar 1 day prior. There is mild right base atelectasis. No new opacity evident. Heart is upper normal in size with pulmonary vascularity within normal limits. No adenopathy. There is a total shoulder replacement on the right. IMPRESSION: Tube and catheter positions as described without pneumothorax. Persistent patchy opacity left base, stable from 1 day prior and likely due to patchy pneumonia. Mild right base atelectasis. Lungs elsewhere clear. Stable cardiac silhouette. Electronically Signed   By: Lowella Grip III M.D.   On: 02/27/2017 07:06   Dg Chest Port 1 View  Result Date: 02/26/2017 CLINICAL DATA:  Respiratory failure, routine. EXAM: PORTABLE CHEST 1 VIEW COMPARISON:  One-view chest x-ray 02/25/2017. FINDINGS: Endotracheal tube terminates 2 cm above the carina and could be pulled back for more optimal positioning. The right hemidiaphragm is now elevated. Interstitial edema is similar to the  prior exam. Aeration at the left base is improved. IMPRESSION: 1. Endotracheal tube terminates 2 cm above the carina and could be pulled back 1-2 cm for more optimal positioning. 2. Persistent low lung volumes with increased elevation of the right hemidiaphragm. Unknown etiology. 3. Improving aeration of both lung bases. Electronically Signed   By: San Morelle M.D.   On: 02/26/2017 08:31    Medications:  Prior to Admission:   Medications Prior to Admission  Medication Sig Dispense Refill Last Dose  . ADVAIR DISKUS 250-50 MCG/DOSE AEPB Take 1 puff by mouth daily.   LF 01-12-17 at 30DS  . baclofen (LIORESAL) 10 MG tablet Take 10 mg by mouth 3 (three) times daily as needed for muscle spasms.   LF 01-12-17 at 90DS  . gabapentin (NEURONTIN) 800 MG tablet Take 800 mg 3 (three) times daily by mouth.    LF 01-30-17 at 30DS  . LORazepam (ATIVAN) 1 MG tablet Take 1 tablet (1 mg total) by mouth 2 (two) times daily as needed for anxiety. 10 tablet 0 LF 01-12-17 at 30DS  . PROAIR HFA 108 (90 Base) MCG/ACT inhaler Inhale 1-2 puffs every 4 (four) hours as needed into the lungs for shortness of breath.   LF 01-31-17 at 90DS  . temazepam (RESTORIL) 30 MG capsule Take 30 mg by mouth at bedtime as needed for sleep.   LF 01-12-17 at 30DS  . atorvastatin (LIPITOR) 20 MG tablet Take 20 mg by mouth at bedtime. Take one tablet daily for hyperlipidemia     at [facility]  . benztropine (COGENTIN) 2 MG tablet Take 1 tablet (2 mg total) by mouth 3 (three) times daily. 90 tablet 0  at [facility]  . ondansetron (ZOFRAN) 8 MG tablet Take 1 tablet (8 mg total) by mouth every 8 (eight) hours as needed for nausea or vomiting. 12 tablet 0  at [facility]  . venlafaxine XR (EFFEXOR-XR) 150 MG 24 hr capsule Take 300 mg by mouth daily with breakfast.   LF 12-07-16 at 30DS  . [DISCONTINUED] ARIPiprazole (ABILIFY) 20 MG tablet Take 1 tablet (20 mg total) by mouth daily. (Patient not taking: Reported on 02/22/2017) 15 tablet 0 LF 12-07-16 at 30DS   Scheduled: . aspirin  81 mg Per Tube Daily  . atorvastatin  20 mg Per Tube QHS  . chlorhexidine gluconate (MEDLINE KIT)  15 mL Mouth Rinse BID  . folic acid  1 mg Intravenous Daily  . heparin  5,000 Units Subcutaneous Q8H  . hydrocortisone sod succinate (SOLU-CORTEF) inj  50 mg Intravenous Q8H  . ipratropium-albuterol  3 mL Nebulization Q6H  . levothyroxine  25 mcg Intravenous Daily  . mouth rinse  15 mL Mouth Rinse QID  . pantoprazole  sodium  40 mg Per Tube Q24H  . thiamine injection  100 mg Intravenous Daily    Assessment:  66 year old female found down with episode of posturing versus seizure.   1. The EEG had a pattern which is most likely non-ictal, but which could represent post-ictal changes (e.g. "burned out status") this improved with time.  2. With her elevated TSH, myxedema coma is also a consideration  3. Overdose on neuroleptics also could give this picture, but home medication aripiprazole listed as not being taken in Epic medications list.  4. MRI brain obtained to evaluate decreased movement of LUE, which was improving, revealed no acute abnormality. Mild periventricular white matter disease was noted. 5. MRI cervical spine reveals disc and facet degeneration; there is spinal stenosis with mild  cord flattening at C5-6. Foraminal narrowings also noted. There are no acute findings to explain left arm deficit. 6. Also noted on MRI C-spine: Prominent edema in the bilateral neck with retropharyngeal collection. The submandibular glands appears thickened and edematous and this may be submandibular sialadenitis (bilaterality suggesting viral/systemic process). Suppurative infection is not excluded.  7. Improving EEG patterns from 11/15-11/16 parallel her clinical improvement on prior documented neurological exams: EEG 11/15: This was a markedly abnormal continuous video EEG due to loss of normal background, diffuse sharply contoured slowing, and a partial burst-suppression pattern. This was indicative of a severe global cerebral disturbance with periods of attenuation likely due to medications. No seizures were seen. EEG 11/16: This was a moderatelyabnormal continuous video EEG due to loss of normal background, diffuse sharply contoured slowing (resolved), and a partial burst-suppression pattern. This was indicative of an improving global cerebral disturbance. No seizures or definite epileptiform discharges were seen, however  sharp activity was present early in the record.  8. Acute renal failure from ATN, now resolved. Initial electrolyte disturbances, now resolved. Currently volume overloaded.  9. Adrenal insufficiency. Currently being treated with solucortef. 10. Acute respiratory failure with hypoxia and compromised airway. Also with aspiration pneumonia. Currently intubated.  Recommendations: 1. Continue Keppra and valproic acid 2. Continue synthroid 3. Agree with continuing to hold outpatient Neurontin, baclofen, Ativan, Restoril, Cogentin and Effexor 4. CIWA protocol 5. Attempt to wean off propofol. Call Neurology for repeat examination after she has been weaned.    LOS: 5 days   '@Electronically'$  signed: Dr. Kerney Elbe 02/27/2017  8:09 AM

## 2017-02-27 NOTE — Progress Notes (Signed)
PULMONARY / CRITICAL CARE MEDICINE   Name: Dominique Acosta MRN: [id number] DOB: April 19, 1950    ADMISSION DATE:  02/22/2017  REFERRING MD:  EDP  CHIEF COMPLAINT:  AMS  HISTORY OF PRESENT ILLNESS:   66 yo female former smoker called EMS and the found unresponsive with agonal respirations and hypothermia at home.  Required intubation for airway protection. PMHx of COPD, CAD, Depression, Schizophrenia, dystonia, seizures, chronic pain, substance abuse, HLD, Hypothyroidism.  SUBJECTIVE:  Tolerating PS at 5/5 this AM.  Moving all extremities, non-purposeful however.  VITAL SIGNS: BP 126/81 (BP Location: Left Arm)   Pulse 82   Temp 98.2 F (36.8 C)   Resp (!) 24   Ht 5\' 1"  (1.549 m)   Wt 88 kg (194 lb 0.1 oz)   SpO2 99%   BMI 36.66 kg/m   VENTILATOR SETTINGS: Vent Mode: PRVC FiO2 (%):  [40 %] 40 % Set Rate:  [14 bmp-18 bmp] 14 bmp Vt Set:  [420 mL] 420 mL PEEP:  [5 cmH20] 5 cmH20 Plateau Pressure:  [13 cmH20-19 cmH20] 15 cmH20  INTAKE / OUTPUT: I/O last 3 completed shifts: In: 3917.1 [I.V.:1169.1; NG/GT:1593; IV IRWERXVQM:0867] Out: 2185 [Urine:2085; Emesis/NG output:100]  PHYSICAL EXAMINATION:  General - adult female, no distress Eyes - pupils reactive ENT - ETT in place, has random tongue movements c/w tardive dyskinesia Cardiac - regular, no murmur Chest - CTAB Abd - soft, non tender Ext - no edema Skin - no rashes Neuro - RASS +1   LABS:  BMET Recent Labs  Lab 02/25/17 2136 02/26/17 0429 02/27/17 0505  NA 140 140 142  K 3.8 3.7 3.8  CL 104 104 103  CO2 26 28 31   BUN 12 12 13   CREATININE 0.91 0.86 0.80  GLUCOSE 152* 146* 143*    Electrolytes Recent Labs  Lab 02/23/17 1522 02/24/17 0608 02/24/17 1716  02/25/17 2136 02/26/17 0429 02/27/17 0505  CALCIUM  --  7.1*  --    < > 7.8* 7.8* 8.3*  MG 1.7 1.8 2.2  --   --   --   --   PHOS 1.3* 1.6* 2.1*  --   --   --   --    < > = values in this interval not displayed.    CBC Recent Labs  Lab  02/25/17 0454 02/26/17 0429 02/27/17 0505  WBC 12.2* 8.8 6.9  HGB 12.0 10.8* 10.4*  HCT 36.0 32.4* 30.4*  PLT 340 276 275    Coag's Recent Labs  Lab 02/22/17 2029  APTT 28  INR 1.07    Sepsis Markers Recent Labs  Lab 02/22/17 1314 02/22/17 2029 02/22/17 2356 02/24/17 0608  LATICACIDVEN 0.73  --   --   --   PROCALCITON  --  <0.10 <0.10 <0.10    ABG Recent Labs  Lab 02/23/17 0500  PHART 7.361  PCO2ART 47.6  PO2ART 70.6*    Liver Enzymes Recent Labs  Lab 02/22/17 1256 02/24/17 0608  AST 29 23  ALT 28 20  ALKPHOS 62 58  BILITOT 1.2 0.6  ALBUMIN 3.1* 2.7*    Cardiac Enzymes Recent Labs  Lab 02/22/17 2029 02/22/17 2356  TROPONINI <0.03 <0.03    Glucose Recent Labs  Lab 02/26/17 0909 02/26/17 1533 02/26/17 1906 02/27/17 0110 02/27/17 0332 02/27/17 0841  GLUCAP 131* 121* 104* 123* 130* 132*    Lab Results  Component Value Date   TSH 51.322 (H) 02/23/2017     Imaging Mr Brain Wo Contrast  Result Date: 02/26/2017  CLINICAL DATA:  Subacute neuro deficit. Found down with posturing versus seizure. Negative brain MRI, but not moving left arm very much. EXAM: MRI HEAD WITHOUT CONTRAST MRI CERVICAL SPINE WITHOUT CONTRAST TECHNIQUE: Multiplanar, multiecho pulse sequences of the brain and surrounding structures, and cervical spine, to include the craniocervical junction and cervicothoracic junction, were obtained without intravenous contrast. COMPARISON:  Brain MRI from 3 days ago FINDINGS: MRI HEAD FINDINGS Brain: No acute infarction, hemorrhage, hydrocephalus, extra-axial collection or mass lesion. Minor periventricular FLAIR hyperintensity, usually microvascular ischemic. No focal finding to explain arm symptoms. Vascular: Major flow voids are preserved Skull and upper cervical spine: Negative for marrow lesion. Sinuses/Orbits: Small nasopharyngeal cysts. There is chronic sinusitis with diffuse mucosal thickening and sphenoid sinus fluid levels.  Progression of bilateral mastoid opacification. Nasopharyngeal fluid is present in the setting of intubation. MRI CERVICAL SPINE FINDINGS Alignment: Normal Vertebrae: No fracture or evidence of aggressive bone lesion. Mild T2 hyperintensity in the C7-T1 disc. Inflammation described below is distant from this level and there is no suspected discitis. Cord: Normal signal and morphology. Posterior Fossa, vertebral arteries, paraspinal tissues: There is extensive edema in the bilateral neck. On axial acquisition both submandibular glands appear expanded and with edematous septate. Fluid tracks around the central compartment and lateral neck ventral to the manubrium. There is a retropharyngeal effusion/collection from C1 to C4 with mass effect on the pharynx. Negative upper mediastinum. These results were called by telephone at the time of interpretation on 02/26/2017 at 6:18 pm to Dr. Roland Rack , who verbally acknowledged these results. Disc levels: C2-3: Mild bilateral facet spurring.  No impingement C3-4: Right more than left facet spurring that is advanced. Mild right uncovertebral spurring. Advanced right foraminal stenosis. Patent spinal canal C4-5: Facet arthropathy. Asymmetric left uncovertebral spurring. Moderate left foraminal narrowing. C5-6: Degenerative disc disease with narrowing and uncovertebral spurring. Bilateral foraminal impingement. Spinal stenosis with posterior disc osteophyte complex and ligamentum flavum thickening causing slight cord flattening. C6-7: Degenerative disc narrowing and spondylosis. Uncovertebral spurs cause mild left and borderline moderate right foraminal narrowing. Partial ventral subarachnoid space effacement. C7-T1:Spondylosis.  No impingement IMPRESSION: Brain MRI: 1. Stable and without explanation for symptoms. 2. Mild periventricular white matter disease. 3. Sinusitis with ethmoid and sphenoid fluid levels, also seen on prior. Bilateral partial mastoid  opacification. Cervical MRI: 1. Prominent edema in the bilateral neck with retropharyngeal collection. The submandibular glands appears thickened and edematous and this may be submandibular sialadenitis (bilaterality suggesting viral/systemic process). Suppurative infection is not excluded. 2. No acute finding to explain left arm deficit. 3. Disc and facet degeneration as described. Spinal stenosis with mild cord flattening at C5-6. Foraminal narrowings noted above. Electronically Signed   By: Monte Fantasia M.D.   On: 02/26/2017 18:30   Mr Cervical Spine Wo Contrast  Result Date: 02/26/2017 CLINICAL DATA:  Subacute neuro deficit. Found down with posturing versus seizure. Negative brain MRI, but not moving left arm very much. EXAM: MRI HEAD WITHOUT CONTRAST MRI CERVICAL SPINE WITHOUT CONTRAST TECHNIQUE: Multiplanar, multiecho pulse sequences of the brain and surrounding structures, and cervical spine, to include the craniocervical junction and cervicothoracic junction, were obtained without intravenous contrast. COMPARISON:  Brain MRI from 3 days ago FINDINGS: MRI HEAD FINDINGS Brain: No acute infarction, hemorrhage, hydrocephalus, extra-axial collection or mass lesion. Minor periventricular FLAIR hyperintensity, usually microvascular ischemic. No focal finding to explain arm symptoms. Vascular: Major flow voids are preserved Skull and upper cervical spine: Negative for marrow lesion. Sinuses/Orbits: Small nasopharyngeal cysts. There is chronic  sinusitis with diffuse mucosal thickening and sphenoid sinus fluid levels. Progression of bilateral mastoid opacification. Nasopharyngeal fluid is present in the setting of intubation. MRI CERVICAL SPINE FINDINGS Alignment: Normal Vertebrae: No fracture or evidence of aggressive bone lesion. Mild T2 hyperintensity in the C7-T1 disc. Inflammation described below is distant from this level and there is no suspected discitis. Cord: Normal signal and morphology. Posterior  Fossa, vertebral arteries, paraspinal tissues: There is extensive edema in the bilateral neck. On axial acquisition both submandibular glands appear expanded and with edematous septate. Fluid tracks around the central compartment and lateral neck ventral to the manubrium. There is a retropharyngeal effusion/collection from C1 to C4 with mass effect on the pharynx. Negative upper mediastinum. These results were called by telephone at the time of interpretation on 02/26/2017 at 6:18 pm to Dr. Roland Rack , who verbally acknowledged these results. Disc levels: C2-3: Mild bilateral facet spurring.  No impingement C3-4: Right more than left facet spurring that is advanced. Mild right uncovertebral spurring. Advanced right foraminal stenosis. Patent spinal canal C4-5: Facet arthropathy. Asymmetric left uncovertebral spurring. Moderate left foraminal narrowing. C5-6: Degenerative disc disease with narrowing and uncovertebral spurring. Bilateral foraminal impingement. Spinal stenosis with posterior disc osteophyte complex and ligamentum flavum thickening causing slight cord flattening. C6-7: Degenerative disc narrowing and spondylosis. Uncovertebral spurs cause mild left and borderline moderate right foraminal narrowing. Partial ventral subarachnoid space effacement. C7-T1:Spondylosis.  No impingement IMPRESSION: Brain MRI: 1. Stable and without explanation for symptoms. 2. Mild periventricular white matter disease. 3. Sinusitis with ethmoid and sphenoid fluid levels, also seen on prior. Bilateral partial mastoid opacification. Cervical MRI: 1. Prominent edema in the bilateral neck with retropharyngeal collection. The submandibular glands appears thickened and edematous and this may be submandibular sialadenitis (bilaterality suggesting viral/systemic process). Suppurative infection is not excluded. 2. No acute finding to explain left arm deficit. 3. Disc and facet degeneration as described. Spinal stenosis with  mild cord flattening at C5-6. Foraminal narrowings noted above. Electronically Signed   By: Monte Fantasia M.D.   On: 02/26/2017 18:30   Dg Chest Port 1 View  Result Date: 02/27/2017 CLINICAL DATA:  Hypoxia EXAM: PORTABLE CHEST 1 VIEW COMPARISON:  February 26, 2017 FINDINGS: Endotracheal tube tip is 1.9 cm above the carina. Nasogastric to tip and side port in stomach. Central catheter tip is in the superior vena cava near the cavoatrial junction. No pneumothorax. There is patchy airspace opacity in the left base, similar 1 day prior. There is mild right base atelectasis. No new opacity evident. Heart is upper normal in size with pulmonary vascularity within normal limits. No adenopathy. There is a total shoulder replacement on the right. IMPRESSION: Tube and catheter positions as described without pneumothorax. Persistent patchy opacity left base, stable from 1 day prior and likely due to patchy pneumonia. Mild right base atelectasis. Lungs elsewhere clear. Stable cardiac silhouette. Electronically Signed   By: Lowella Grip III M.D.   On: 02/27/2017 07:06   STUDIES:  CT head 11/14 >> no acute findings EEG 11/14 >> diffuse slowing and partial burst suppression pattern MRI brain 11/15 >> atrophy and small vessel disease  CULTURES: BC x 2 11/14>>> Trach asp 11/14 >> Few Staph aureus >>  Legionella Ag 11/14 >> negative Pneumococcal Ag 11/14 >> negative  ANTIBIOTICS: vancomycin 11/14>>> Levaquin 11/14 >>> 11/15 Aztreonam 11/14>>> 11/15 Cefepime 11/15 >> 11/18  SIGNIFICANT EVENTS: 11/14 Admit, neurology consulted 11/15 continuous EEG  LINES/TUBES: ETT 11/14>>> Rt IJ CVL 11/15>>>  DISCUSSION: 66 yo with  acute respiratory failure, altered mental status, and hypothermia.  She has hx of hypothyroidism, schizophrenia, chronic pain, and polysubstance abuse.  It is uncertain whether she has been taking synthroid.  She also has aspiration pneumonia.  ASSESSMENT / PLAN:  Acute respiratory  failure with hypoxia and compromised airway. Hx of COPD. - pressure support wean - continue for now, can consider extubation if she continues to do well (mental status has been barrier over the weekend) - f/u CXR - scheduled BDs  Aspiration pneumonia with Staph aureus in sputum. - has PCN allergy listed, but tolerated cephalosporins previously - day 5 of Abx >> d/c cefepime and continue vancomycin  Hypothyroidism - ? Myxedema coma. Adrenal insufficiency >> cortisol 14.1 from 11/15. - continue synthroid and solu cortef  Acute metabolic encephalopathy >> has some improvement 11/17.  Consider myxedema given picture along with hx of no synthroid supplementation Hx of depression, chronic pain, schizophrenia, polysubstance abuse, dystonia, seizures. Tardive dyskinesia. - Sedation: Propofol gtt / Fentanyl PRN - RASS goal 0 - AEDs per neurology - hold outpt neurontin, baclofen, ativan, restoril, cogentin, effexor for now  Hx of CAD, HLD. - ASA, lipitor  Hypokalemia, hypophosphatemia, hypomagnesemia - resolved 11/19. Acute renal failure from ATN >> resolved. Volume overload - +11L net since admit. - correct electrolytes as indicated - KVO IV fluids - 40mg  lasix now  DVT prophylaxis - SQ heparin SUP - protonix Nutrition - tube feeds Goals of care - DNR  CC time: 30 min.   Montey Hora, [place] [facility] [place] [facility] Pager: [phone]  or [phone] 02/27/2017, 9:09 AM

## 2017-02-28 ENCOUNTER — Inpatient Hospital Stay (HOSPITAL_COMMUNITY): Payer: Medicare Other

## 2017-02-28 LAB — RENAL FUNCTION PANEL
ALBUMIN: 2.7 g/dL — AB (ref 3.5–5.0)
Anion gap: 10 (ref 5–15)
BUN: 19 mg/dL (ref 6–20)
CALCIUM: 8.3 mg/dL — AB (ref 8.9–10.3)
CO2: 36 mmol/L — AB (ref 22–32)
CREATININE: 0.79 mg/dL (ref 0.44–1.00)
Chloride: 97 mmol/L — ABNORMAL LOW (ref 101–111)
GFR calc Af Amer: >60 mL/min (ref >60)
GFR calc non Af Amer: >60 mL/min (ref >60)
GLUCOSE: 128 mg/dL — AB (ref 65–99)
PHOSPHORUS: 3.1 mg/dL (ref 2.5–4.6)
Potassium: 3.3 mmol/L — ABNORMAL LOW (ref 3.5–5.1)
SODIUM: 143 mmol/L (ref 135–145)

## 2017-02-28 LAB — CBC WITH DIFFERENTIAL/PLATELET
BASOS ABS: 0 10*3/uL (ref 0.0–0.1)
Basophils Relative: 0 %
EOS ABS: 0 10*3/uL (ref 0.0–0.7)
Eosinophils Relative: 0 %
HCT: 32.5 % — ABNORMAL LOW (ref 36.0–46.0)
Hemoglobin: 10.5 g/dL — ABNORMAL LOW (ref 12.0–15.0)
LYMPHS ABS: 2 10*3/uL (ref 0.7–4.0)
LYMPHS PCT: 25 %
MCH: 32.7 pg (ref 26.0–34.0)
MCHC: 32.3 g/dL (ref 30.0–36.0)
MCV: 101.2 fL — ABNORMAL HIGH (ref 78.0–100.0)
MONOS PCT: 10 %
Monocytes Absolute: 0.8 10*3/uL (ref 0.1–1.0)
NEUTROS ABS: 5.3 10*3/uL (ref 1.7–7.7)
Neutrophils Relative %: 65 %
PLATELETS: 269 10*3/uL (ref 150–400)
RBC: 3.21 MIL/uL — AB (ref 3.87–5.11)
RDW: 15.5 % (ref 11.5–15.5)
WBC: 8.1 10*3/uL (ref 4.0–10.5)

## 2017-02-28 LAB — CULTURE, BLOOD (ROUTINE X 2)
CULTURE: NO GROWTH
Special Requests: ADEQUATE

## 2017-02-28 LAB — GLUCOSE, CAPILLARY
GLUCOSE-CAPILLARY: 131 mg/dL — AB (ref 65–99)
GLUCOSE-CAPILLARY: 143 mg/dL — AB (ref 65–99)
Glucose-Capillary: 107 mg/dL — ABNORMAL HIGH (ref 65–99)
Glucose-Capillary: 108 mg/dL — ABNORMAL HIGH (ref 65–99)
Glucose-Capillary: 118 mg/dL — ABNORMAL HIGH (ref 65–99)
Glucose-Capillary: 136 mg/dL — ABNORMAL HIGH (ref 65–99)

## 2017-02-28 LAB — MAGNESIUM: MAGNESIUM: 2.3 mg/dL (ref 1.7–2.4)

## 2017-02-28 LAB — TRIGLYCERIDES: Triglycerides: 164 mg/dL — ABNORMAL HIGH (ref <150)

## 2017-02-28 MED ORDER — FENTANYL 2500MCG IN NS 250ML (10MCG/ML) PREMIX INFUSION
25.0000 ug/h | INTRAVENOUS | Status: DC
  Administered 2017-02-28: 50 ug/h via INTRAVENOUS
  Administered 2017-03-01: 225 ug/h via INTRAVENOUS
  Administered 2017-03-02: 125 ug/h via INTRAVENOUS
  Administered 2017-03-02: 150 ug/h via INTRAVENOUS
  Administered 2017-03-04: 75 ug/h via INTRAVENOUS
  Filled 2017-02-28 (×5): qty 250

## 2017-02-28 MED ORDER — SODIUM CHLORIDE 0.9 % IV SOLN
0.4000 ug/kg/h | INTRAVENOUS | Status: DC
  Administered 2017-02-28: 0.4 ug/kg/h via INTRAVENOUS
  Filled 2017-02-28 (×2): qty 2

## 2017-02-28 MED ORDER — FUROSEMIDE 10 MG/ML IJ SOLN
40.0000 mg | Freq: Once | INTRAMUSCULAR | Status: AC
  Administered 2017-02-28: 40 mg via INTRAVENOUS
  Filled 2017-02-28: qty 4

## 2017-02-28 MED ORDER — DEXMEDETOMIDINE HCL IN NACL 400 MCG/100ML IV SOLN
0.4000 ug/kg/h | INTRAVENOUS | Status: DC
  Administered 2017-02-28 (×2): 1.2 ug/kg/h via INTRAVENOUS
  Administered 2017-02-28: 1 ug/kg/h via INTRAVENOUS
  Administered 2017-03-01: 0.6 ug/kg/h via INTRAVENOUS
  Administered 2017-03-01: 1.2 ug/kg/h via INTRAVENOUS
  Administered 2017-03-01 (×3): 1 ug/kg/h via INTRAVENOUS
  Administered 2017-03-02: 1.5 ug/kg/h via INTRAVENOUS
  Administered 2017-03-02: 1 ug/kg/h via INTRAVENOUS
  Administered 2017-03-02: 1.2 ug/kg/h via INTRAVENOUS
  Administered 2017-03-02: 1.5 ug/kg/h via INTRAVENOUS
  Administered 2017-03-02: 0.8 ug/kg/h via INTRAVENOUS
  Administered 2017-03-02 – 2017-03-04 (×12): 1.5 ug/kg/h via INTRAVENOUS
  Administered 2017-03-04: 1.2 ug/kg/h via INTRAVENOUS
  Administered 2017-03-04 – 2017-03-05 (×2): 1.5 ug/kg/h via INTRAVENOUS
  Administered 2017-03-05: 1.2 ug/kg/h via INTRAVENOUS
  Administered 2017-03-05: 1.5 ug/kg/h via INTRAVENOUS
  Administered 2017-03-05: 1 ug/kg/h via INTRAVENOUS
  Administered 2017-03-05: 0.5 ug/kg/h via INTRAVENOUS
  Administered 2017-03-05: 1.2 ug/kg/h via INTRAVENOUS
  Administered 2017-03-06: 0.6 ug/kg/h via INTRAVENOUS
  Filled 2017-02-28 (×35): qty 100

## 2017-02-28 MED ORDER — POTASSIUM CHLORIDE 20 MEQ/15ML (10%) PO SOLN
40.0000 meq | Freq: Once | ORAL | Status: AC
  Administered 2017-02-28: 40 meq
  Filled 2017-02-28: qty 30

## 2017-02-28 MED ORDER — FENTANYL BOLUS VIA INFUSION
25.0000 ug | INTRAVENOUS | Status: DC | PRN
  Administered 2017-03-01 (×6): 25 ug via INTRAVENOUS
  Filled 2017-02-28: qty 25

## 2017-02-28 MED ORDER — HYDROCORTISONE NA SUCCINATE PF 100 MG IJ SOLR
50.0000 mg | Freq: Two times a day (BID) | INTRAMUSCULAR | Status: DC
  Administered 2017-02-28 – 2017-03-02 (×4): 50 mg via INTRAVENOUS
  Filled 2017-02-28 (×4): qty 1

## 2017-02-28 MED ORDER — ARIPIPRAZOLE 10 MG PO TABS
20.0000 mg | ORAL_TABLET | Freq: Every day | ORAL | Status: DC
  Administered 2017-02-28 – 2017-03-17 (×14): 20 mg
  Filled 2017-02-28 (×18): qty 2

## 2017-02-28 NOTE — Procedures (Signed)
CVP line changed without complications.

## 2017-02-28 NOTE — Progress Notes (Signed)
Nutrition Follow-up  DOCUMENTATION CODES:   Obesity unspecified  INTERVENTION:   Continue:  Vital High Protein at 45 ml/h (1080 ml per day)  Provides 1080 kcal, 95 gm protein, 903 ml free water daily  Recommend start bowel regimen -- no BM since admission.  NUTRITION DIAGNOSIS:   Inadequate oral intake related to inability to eat as evidenced by NPO status.  Ongoing  GOAL:   Provide needs based on ASPEN/SCCM guidelines  Met with TF  MONITOR:   Vent status, TF tolerance, Labs, I & O's  ASSESSMENT:   66 yo female with PMH of epilepsy, hypothyroidism, HLD, depression, CAD, leukemia, MI, COPD, schizophrenia, psychosis who was admitted on 11/14 with hypoxic acute respiratory failure, aspiration PNA, requiring intubation on admission.  Patient is tolerating TF without difficulty per discussion with RN. She has not yet had a BM.  Patient is currently receiving Vital High Protein via OGT at 45 ml/h (1080 ml/day) to provide 1080 kcals, 95 gm protein, 903 ml free water daily.  Patient remains intubated on ventilator support Temp (24hrs), Avg:99.3 F (37.4 C), Min:98.2 F (36.8 C), Max:100 F (37.8 C)   Labs reviewed. Potassium 3.3 (L) CBG's: [phone] Medications reviewed and include folic acid and thiamine.   NUTRITION - FOCUSED PHYSICAL EXAM:    Most Recent Value  Orbital Region  No depletion  Upper Arm Region  No depletion  Thoracic and Lumbar Region  Unable to assess  Buccal Region  No depletion  Temple Region  No depletion  Clavicle Bone Region  No depletion  Clavicle and Acromion Bone Region  No depletion  Scapular Bone Region  No depletion  Dorsal Hand  No depletion  Patellar Region  No depletion  Anterior Thigh Region  No depletion  Posterior Calf Region  No depletion  Edema (RD Assessment)  None  Hair  Reviewed  Eyes  Unable to assess  Mouth  Unable to assess  Skin  Reviewed  Nails  Reviewed       Diet Order:  Diet NPO time  specified  EDUCATION NEEDS:   No education needs have been identified at this time  Skin:  Skin Assessment: Reviewed RN Assessment  Last BM:  PTA  Height:   Ht Readings from Last 1 Encounters:  02/22/17 '5\' 1"'$  (1.549 m)    Weight:   Wt Readings from Last 1 Encounters:  02/28/17 190 lb 14.7 oz (86.6 kg)    Ideal Body Weight:  47.7 kg  BMI:  Body mass index is 36.07 kg/m.  Estimated Nutritional Needs:   Kcal:  [phone]  Protein:  95 gm  Fluid:  1.2-1.5 L   Molli Barrows, RD, LDN, CNSC Pager [phone] After Hours Pager [phone]

## 2017-02-28 NOTE — Progress Notes (Signed)
PULMONARY / CRITICAL CARE MEDICINE   Name: Dominique Acosta MRN: [id number] DOB: 04/23/1950    ADMISSION DATE:  02/22/2017  REFERRING MD:  EDP  CHIEF COMPLAINT:  AMS  HISTORY OF PRESENT ILLNESS:   66 yo female former smoker called EMS and the found unresponsive with agonal respirations and hypothermia at home.  Required intubation for airway protection. PMHx of COPD, CAD, Depression, Schizophrenia, dystonia, seizures, chronic pain, substance abuse, HLD, Hypothyroidism.  SUBJECTIVE: Agitation is the NP admit to extubation  VITAL SIGNS: BP 95/61 (BP Location: Left Arm)   Pulse (!) 107   Temp 99 F (37.2 C)   Resp 20   Ht 5\' 1"  (1.549 m)   Wt 190 lb 14.7 oz (86.6 kg)   SpO2 93%   BMI 36.07 kg/m   VENTILATOR SETTINGS: Vent Mode: PSV;CPAP FiO2 (%):  [40 %] 40 % Set Rate:  [14 bmp] 14 bmp Vt Set:  [420 mL] 420 mL PEEP:  [5 cmH20] 5 cmH20 Pressure Support:  [5 cmH20] 5 cmH20 Plateau Pressure:  [14 cmH20-18 cmH20] 16 cmH20  INTAKE / OUTPUT: I/O last 3 completed shifts: In: 3533.6 [I.V.:918.6; NG/GT:1575; IV Piggyback:1040] Out: 4580 Hagen.Gall; Emesis/NG output:100]  PHYSICAL EXAMINATION:  General: Agitated white female currently intubated follow some commands. HEENT: MM pink/moist endotracheal tube in place PSY when not sedated agitated: Neuro: Follows commands at times.  Requires heavy sedation to tolerate endotracheal tube CV: Heart sounds are regular regular rate and rhythm   PULM: Decreased breath sounds to the bases DX:IPJA, non-tender, bsx4 active  Extremities: warm/dry, 1+ edema lower extremity foot drop noted disorder Skin: no rashes or lesions    LABS:  BMET Recent Labs  Lab 02/26/17 0429 02/27/17 0505 02/28/17 0445  NA 140 142 143  K 3.7 3.8 3.3*  CL 104 103 97*  CO2 28 31 36*  BUN 12 13 19   CREATININE 0.86 0.80 0.79  GLUCOSE 146* 143* 128*    Electrolytes Recent Labs  Lab 02/24/17 0608 02/24/17 1716  02/26/17 0429 02/27/17 0505  02/28/17 0445  CALCIUM 7.1*  --    < > 7.8* 8.3* 8.3*  MG 1.8 2.2  --   --   --  2.3  PHOS 1.6* 2.1*  --   --   --  3.1   < > = values in this interval not displayed.    CBC Recent Labs  Lab 02/26/17 0429 02/27/17 0505 02/28/17 0445  WBC 8.8 6.9 8.1  HGB 10.8* 10.4* 10.5*  HCT 32.4* 30.4* 32.5*  PLT 276 275 269    Coag's Recent Labs  Lab 02/22/17 2029  APTT 28  INR 1.07    Sepsis Markers Recent Labs  Lab 02/22/17 1314 02/22/17 2029 02/22/17 2356 02/24/17 0608  LATICACIDVEN 0.73  --   --   --   PROCALCITON  --  <0.10 <0.10 <0.10    ABG Recent Labs  Lab 02/23/17 0500  PHART 7.361  PCO2ART 47.6  PO2ART 70.6*    Liver Enzymes Recent Labs  Lab 02/22/17 1256 02/24/17 0608 02/28/17 0445  AST 29 23  --   ALT 28 20  --   ALKPHOS 62 58  --   BILITOT 1.2 0.6  --   ALBUMIN 3.1* 2.7* 2.7*    Cardiac Enzymes Recent Labs  Lab 02/22/17 2029 02/22/17 2356  TROPONINI <0.03 <0.03    Glucose Recent Labs  Lab 02/27/17 0841 02/27/17 1217 02/27/17 1537 02/27/17 2027 02/28/17 0030 02/28/17 0438  GLUCAP 132* 113* 115* 119*  136* 131*    Lab Results  Component Value Date   TSH 51.322 (H) 02/23/2017     Imaging No results found. STUDIES:  CT head 11/14 >> no acute findings EEG 11/14 >> diffuse slowing and partial burst suppression pattern MRI brain 11/15 >> atrophy and small vessel disease  CULTURES: BC x 2 11/14>>> Trach asp 11/14 >> Few Staph aureus >> rare MRSA Legionella Ag 11/14 >> negative Pneumococcal Ag 11/14 >> negative  ANTIBIOTICS: vancomycin 11/14>>> Levaquin 11/14 >>> 11/15 Aztreonam 11/14>>> 11/15 Cefepime 11/15 >> 11/18  SIGNIFICANT EVENTS: 11/14 Admit, neurology consulted 11/15 continuous EEG  LINES/TUBES: ETT 11/14>>> Rt IJ CVL 11/15>>>  DISCUSSION: 66 yo with acute respiratory failure, altered mental status, and hypothermia.  She has hx of hypothyroidism, schizophrenia, chronic pain, and polysubstance abuse.  It  is uncertain whether she has been taking synthroid.  She also has aspiration pneumonia.  Continue to wean as tolerated she is a DNR therefore will be a one-way extubation.  She has history of cervical edema but has full range of motion of her neck at this time.  ASSESSMENT / PLAN:  Acute respiratory failure with hypoxia and compromised airway. Hx of COPD. - pressure support wean - continue for now, can consider extubation if she continues to do well .  Mental status is barrier to extubation.  She is a DNR therefore it would be a one-way extubation when done.  We will continue with diuresis adding her antipsychotic medications to optimize her for extubation. - f/u CXR - scheduled BDs  Aspiration pneumonia with Staph aureus with rare MRSA in sputum. - has PCN allergy listed, but tolerated cephalosporins previously - day 6 of Abx >> continue vancomycin  Hypothyroidism - ? Myxedema coma. Adrenal insufficiency >> cortisol 14.1 from 11/15. - continue synthroid and solu cortef wean Solu-Cortef  Acute metabolic encephalopathy >> has some improvement 11/20.  Hx of depression, chronic pain, schizophrenia, polysubstance abuse, dystonia, seizures. Tardive dyskinesia. - Sedation: Propofol gtt / Fentanyl PRN/add Precedex 02/28/2017 and wean propofol - RASS goal 0 - AEDs per neurology - hold outpt neurontin, baclofen, ativan, restoril, cogentin, effexor for now will give her Abilify prior to  Hx of CAD, HLD. - ASA, lipitor  Hypokalemia, hypophosphatemia, hypomagnesemia - resolved 11/19. Recent Labs  Lab 02/26/17 0429 02/27/17 0505 02/28/17 0445  K 3.7 3.8 3.3*   Lab Results  Component Value Date   CREATININE 0.79 02/28/2017   CREATININE 0.80 02/27/2017   CREATININE 0.86 02/26/2017   CREATININE 0.67 09/02/2010    Intake/Output Summary (Last 24 hours) at 02/28/2017 0824 Last data filed at 02/28/2017 0800 Gross per 24 hour  Intake 2236.63 ml  Output 4135 ml  Net -1898.37 ml   Filed  Weights   02/26/17 0315 02/27/17 0244 02/28/17 0500  Weight: 193 lb 2 oz (87.6 kg) 194 lb 0.1 oz (88 kg) 190 lb 14.7 oz (86.6 kg)    Acute renal failure from ATN >> resolved. Volume overload - 2 l from lasix 11/19 - correct electrolytes as indicated - KVO IV fluids - 40mg  lasix 11/20  DVT prophylaxis - SQ heparin SUP - protonix Nutrition - tube feeds Goals of care - DNR but intubated and with cervical issues and psych issues.  CC time: 30 min.   Richardson Landry Minor ACNP Maryanna Shape PCCM Pager [phone] till 1 pm If no answer page [phone] 02/28/2017, 8:23 AM

## 2017-02-28 NOTE — Progress Notes (Signed)
Patient with increased agitation, trying to pull ETT out. PRNs given and Precedex maxed at 1.2 with no change. MD notified, new orders placed, will continue to monitor.

## 2017-02-28 NOTE — Progress Notes (Signed)
PCCM Re-Rounding Note:  Negative 1.5 L. BP & HR stable on Precedex. Holding on further Lasix for now.   Sonia Baller Ashok Cordia, M.D. [facility] Pager:  [phone] After 7pm or if no response, call [phone] 6:00 PM 02/28/17

## 2017-03-01 ENCOUNTER — Inpatient Hospital Stay (HOSPITAL_COMMUNITY): Payer: Medicare Other

## 2017-03-01 DIAGNOSIS — J9601 Acute respiratory failure with hypoxia: Secondary | ICD-10-CM

## 2017-03-01 LAB — GLUCOSE, CAPILLARY
GLUCOSE-CAPILLARY: 109 mg/dL — AB (ref 65–99)
GLUCOSE-CAPILLARY: 122 mg/dL — AB (ref 65–99)
GLUCOSE-CAPILLARY: 135 mg/dL — AB (ref 65–99)
GLUCOSE-CAPILLARY: 159 mg/dL — AB (ref 65–99)
GLUCOSE-CAPILLARY: 86 mg/dL (ref 65–99)

## 2017-03-01 LAB — RENAL FUNCTION PANEL
Albumin: 2.8 g/dL — ABNORMAL LOW (ref 3.5–5.0)
Anion gap: 7 (ref 5–15)
BUN: 23 mg/dL — AB (ref 6–20)
CHLORIDE: 96 mmol/L — AB (ref 101–111)
CO2: 39 mmol/L — AB (ref 22–32)
Calcium: 7.9 mg/dL — ABNORMAL LOW (ref 8.9–10.3)
Creatinine, Ser: 0.71 mg/dL (ref 0.44–1.00)
GFR calc Af Amer: >60 mL/min (ref >60)
GFR calc non Af Amer: >60 mL/min (ref >60)
GLUCOSE: 117 mg/dL — AB (ref 65–99)
POTASSIUM: 3.1 mmol/L — AB (ref 3.5–5.1)
Phosphorus: 3.2 mg/dL (ref 2.5–4.6)
Sodium: 142 mmol/L (ref 135–145)

## 2017-03-01 LAB — CBC WITH DIFFERENTIAL/PLATELET
BASOS PCT: 0 %
Basophils Absolute: 0 10*3/uL (ref 0.0–0.1)
EOS ABS: 0.1 10*3/uL (ref 0.0–0.7)
EOS PCT: 1 %
HCT: 29.8 % — ABNORMAL LOW (ref 36.0–46.0)
HEMOGLOBIN: 9.5 g/dL — AB (ref 12.0–15.0)
Lymphocytes Relative: 32 %
Lymphs Abs: 2.1 10*3/uL (ref 0.7–4.0)
MCH: 33 pg (ref 26.0–34.0)
MCHC: 31.9 g/dL (ref 30.0–36.0)
MCV: 103.5 fL — ABNORMAL HIGH (ref 78.0–100.0)
MONOS PCT: 6 %
Monocytes Absolute: 0.4 10*3/uL (ref 0.1–1.0)
NEUTROS PCT: 61 %
Neutro Abs: 3.9 10*3/uL (ref 1.7–7.7)
PLATELETS: 212 10*3/uL (ref 150–400)
RBC: 2.88 MIL/uL — ABNORMAL LOW (ref 3.87–5.11)
RDW: 15.2 % (ref 11.5–15.5)
WBC: 6.4 10*3/uL (ref 4.0–10.5)

## 2017-03-01 LAB — ABO/RH: ABO/RH(D): A POS

## 2017-03-01 LAB — MAGNESIUM: MAGNESIUM: 2.4 mg/dL (ref 1.7–2.4)

## 2017-03-01 LAB — TYPE AND SCREEN
ABO/RH(D): A POS
Antibody Screen: NEGATIVE

## 2017-03-01 LAB — HEMOGLOBIN AND HEMATOCRIT, BLOOD
HCT: 34.2 % — ABNORMAL LOW (ref 36.0–46.0)
Hemoglobin: 11.1 g/dL — ABNORMAL LOW (ref 12.0–15.0)

## 2017-03-01 MED ORDER — DOCUSATE SODIUM 50 MG/5ML PO LIQD
100.0000 mg | Freq: Two times a day (BID) | ORAL | Status: DC
  Administered 2017-03-01 – 2017-03-04 (×7): 100 mg
  Filled 2017-03-01 (×7): qty 10

## 2017-03-01 MED ORDER — VITAMIN B-1 100 MG PO TABS
100.0000 mg | ORAL_TABLET | Freq: Every day | ORAL | Status: DC
  Administered 2017-03-02 – 2017-03-04 (×3): 100 mg
  Filled 2017-03-01 (×4): qty 1

## 2017-03-01 MED ORDER — LORAZEPAM 2 MG/ML IJ SOLN
1.0000 mg | INTRAMUSCULAR | Status: DC | PRN
  Administered 2017-03-01: 1 mg via INTRAVENOUS
  Filled 2017-03-01: qty 1

## 2017-03-01 MED ORDER — VITAMIN B-1 100 MG PO TABS: 100.0000 mg | ORAL_TABLET | Freq: Every day | ORAL | Status: DC

## 2017-03-01 MED ORDER — POTASSIUM CHLORIDE 20 MEQ/15ML (10%) PO SOLN
40.0000 meq | Freq: Once | ORAL | Status: AC
  Administered 2017-03-01: 40 meq
  Filled 2017-03-01: qty 30

## 2017-03-01 MED ORDER — BISACODYL 10 MG RE SUPP
10.0000 mg | Freq: Once | RECTAL | Status: AC
  Administered 2017-03-01: 10 mg via RECTAL
  Filled 2017-03-01: qty 1

## 2017-03-01 MED ORDER — FOLIC ACID 1 MG PO TABS
1.0000 mg | ORAL_TABLET | Freq: Every day | ORAL | Status: DC
  Administered 2017-03-02 – 2017-03-04 (×3): 1 mg
  Filled 2017-03-01 (×4): qty 1

## 2017-03-01 MED ORDER — FENTANYL CITRATE (PF) 100 MCG/2ML IJ SOLN: 25.0000 ug | INTRAMUSCULAR | Status: DC | PRN

## 2017-03-01 MED ORDER — SENNOSIDES 8.8 MG/5ML PO SYRP
5.0000 mL | ORAL_SOLUTION | Freq: Two times a day (BID) | ORAL | Status: DC
  Administered 2017-03-01 – 2017-03-17 (×24): 5 mL
  Filled 2017-03-01 (×33): qty 5

## 2017-03-01 MED ORDER — MIDAZOLAM HCL 2 MG/2ML IJ SOLN
1.0000 mg | INTRAMUSCULAR | Status: DC | PRN
  Administered 2017-03-01 – 2017-03-03 (×13): 2 mg via INTRAVENOUS
  Filled 2017-03-01 (×5): qty 2
  Filled 2017-03-01 (×2): qty 4
  Filled 2017-03-01: qty 2
  Filled 2017-03-01: qty 4
  Filled 2017-03-01: qty 2

## 2017-03-01 MED ORDER — FOLIC ACID 1 MG PO TABS: 1.0000 mg | ORAL_TABLET | Freq: Every day | ORAL | Status: DC

## 2017-03-01 NOTE — Progress Notes (Signed)
Subjective: 66 yo female former smoker presented via EMS with AMS. She initially alerted EMS on her own, but was found by EMS to be unresponsive with agonal respirations and hypothermia at home.  She required intubation for airway protection. She has a PMHx of COPD, CAD, Depression, Schizophrenia, dystonia, seizures (but not on seizure medications at home), chronic pain, substance abuse, HLD, Hypothyroidism.   Yesterday, she continued to exhibit symptoms of encephalopathy. Notes document gradual improvement during this admission.   Today, Wednesday - Pt remains sedated on fentanyl and precedex. The nurse attempted to decrease the sedating medications this morning; however, the patient became agitated and required Ativan for control. The Precedex was increased to its previous level. The patient remains somewhat sedated. She opens her eyes, follows minimal commands, but does not attempt to speak.  Objective: Current vital signs: BP (!) 90/52   Pulse (!) 58   Temp 98 F (36.7 C) (Oral)   Resp 14   Ht '5\' 1"'$  (1.549 m)   Wt 193 lb 9 oz (87.8 kg)   SpO2 93%   BMI 36.57 kg/m  Vital signs in last 24 hours: Temp:  [97.9 F (36.6 C)-100 F (37.8 C)] 98 F (36.7 C) (11/21 0728) Pulse Rate:  [52-103] 58 (11/21 0744) Resp:  [8-27] 14 (11/21 0744) BP: (73-154)/(44-91) 90/52 (11/21 0700) SpO2:  [90 %-97 %] 93 % (11/21 0744) FiO2 (%):  [40 %] 40 % (11/21 0744) Weight:  [193 lb 9 oz (87.8 kg)] 193 lb 9 oz (87.8 kg) (11/21 0500)  Intake/Output from previous day: 11/20 0701 - 11/21 0700 In: 2392.1 [I.V.:712.1; NG/GT:1180; IV Piggyback:500] Out: 2970 [Urine:2970] Intake/Output this shift: No intake/output data recorded. Nutritional status: Diet NPO time specified  Neurologic Exam: Ment: Eyes open spontaneously. Once appeared to follow commands to open and close her eyes and wiggle her toes. Followed no other commands. Currently not moving any extremities unless stimulated. No attempts to  communicate. Occasionally will track visual stimuli.  CN: Pupils equal at 4 mm. stares straight ahead. Unable to check EOMs.. Face symmetric. Motor/Sensory: Does not move to command. Withdraws to noxious stimuli. Wiggled toes once to command. DTRs - brisk on the right. Unable to elicit on the left.  Heart - regular rate and rhythm. No murmur noted. Lungs - diffuse rhonchi in all fields.  Lab Results: Results for orders placed or performed during the hospital encounter of 02/22/17 (from the past 48 hour(s))  Glucose, capillary     Status: Abnormal   Collection Time: 02/27/17 12:17 PM  Result Value Ref Range   Glucose-Capillary 113 (H) 65 - 99 mg/dL   Comment 1 Capillary Specimen    Comment 2 Notify RN   T4, free     Status: Abnormal   Collection Time: 02/27/17  1:11 PM  Result Value Ref Range   Free T4 0.24 (L) 0.61 - 1.12 ng/dL    Comment: (NOTE) Biotin ingestion may interfere with free T4 tests. If the results are inconsistent with the TSH level, previous test results, or the clinical presentation, then consider biotin interference. If needed, order repeat testing after stopping biotin.   Vitamin B12     Status: None   Collection Time: 02/27/17  1:11 PM  Result Value Ref Range   Vitamin B-12 484 180 - 914 pg/mL    Comment: (NOTE) This assay is not validated for testing neonatal or myeloproliferative syndrome specimens for Vitamin B12 levels.   Glucose, capillary     Status: Abnormal  Collection Time: 02/27/17  3:37 PM  Result Value Ref Range   Glucose-Capillary 115 (H) 65 - 99 mg/dL   Comment 1 Capillary Specimen    Comment 2 Notify RN   Glucose, capillary     Status: Abnormal   Collection Time: 02/27/17  8:27 PM  Result Value Ref Range   Glucose-Capillary 119 (H) 65 - 99 mg/dL   Comment 1 Document in Chart   Glucose, capillary     Status: Abnormal   Collection Time: 02/28/17 12:30 AM  Result Value Ref Range   Glucose-Capillary 136 (H) 65 - 99 mg/dL  Glucose,  capillary     Status: Abnormal   Collection Time: 02/28/17  4:38 AM  Result Value Ref Range   Glucose-Capillary 131 (H) 65 - 99 mg/dL   Comment 1 Capillary Specimen   Magnesium     Status: None   Collection Time: 02/28/17  4:45 AM  Result Value Ref Range   Magnesium 2.3 1.7 - 2.4 mg/dL  CBC with Differential/Platelet     Status: Abnormal   Collection Time: 02/28/17  4:45 AM  Result Value Ref Range   WBC 8.1 4.0 - 10.5 K/uL   RBC 3.21 (L) 3.87 - 5.11 MIL/uL   Hemoglobin 10.5 (L) 12.0 - 15.0 g/dL   HCT 32.5 (L) 36.0 - 46.0 %   MCV 101.2 (H) 78.0 - 100.0 fL   MCH 32.7 26.0 - 34.0 pg   MCHC 32.3 30.0 - 36.0 g/dL   RDW 15.5 11.5 - 15.5 %   Platelets 269 150 - 400 K/uL   Neutrophils Relative % 65 %   Lymphocytes Relative 25 %   Monocytes Relative 10 %   Eosinophils Relative 0 %   Basophils Relative 0 %   Neutro Abs 5.3 1.7 - 7.7 K/uL   Lymphs Abs 2.0 0.7 - 4.0 K/uL   Monocytes Absolute 0.8 0.1 - 1.0 K/uL   Eosinophils Absolute 0.0 0.0 - 0.7 K/uL   Basophils Absolute 0.0 0.0 - 0.1 K/uL   RBC Morphology POLYCHROMASIA PRESENT    WBC Morphology MILD LEFT SHIFT (1-5% METAS, OCC MYELO, OCC BANDS)   Renal function panel     Status: Abnormal   Collection Time: 02/28/17  4:45 AM  Result Value Ref Range   Sodium 143 135 - 145 mmol/L   Potassium 3.3 (L) 3.5 - 5.1 mmol/L   Chloride 97 (L) 101 - 111 mmol/L   CO2 36 (H) 22 - 32 mmol/L   Glucose, Bld 128 (H) 65 - 99 mg/dL   BUN 19 6 - 20 mg/dL   Creatinine, Ser 0.79 0.44 - 1.00 mg/dL   Calcium 8.3 (L) 8.9 - 10.3 mg/dL   Phosphorus 3.1 2.5 - 4.6 mg/dL   Albumin 2.7 (L) 3.5 - 5.0 g/dL   GFR calc non Af Amer >60 >60 mL/min   GFR calc Af Amer >60 >60 mL/min    Comment: (NOTE) The eGFR has been calculated using the CKD EPI equation. This calculation has not been validated in all clinical situations. eGFR's persistently <60 mL/min signify possible Chronic Kidney Disease.    Anion gap 10 5 - 15  Glucose, capillary     Status: Abnormal    Collection Time: 02/28/17  8:47 AM  Result Value Ref Range   Glucose-Capillary 118 (H) 65 - 99 mg/dL   Comment 1 Capillary Specimen    Comment 2 Notify RN   Glucose, capillary     Status: Abnormal   Collection Time: 02/28/17  12:56 PM  Result Value Ref Range   Glucose-Capillary 107 (H) 65 - 99 mg/dL  Triglycerides     Status: Abnormal   Collection Time: 02/28/17  3:22 PM  Result Value Ref Range   Triglycerides 164 (H) <150 mg/dL  Glucose, capillary     Status: Abnormal   Collection Time: 02/28/17  3:51 PM  Result Value Ref Range   Glucose-Capillary 108 (H) 65 - 99 mg/dL  Glucose, capillary     Status: Abnormal   Collection Time: 02/28/17  7:14 PM  Result Value Ref Range   Glucose-Capillary 143 (H) 65 - 99 mg/dL   Comment 1 Notify RN   Glucose, capillary     Status: Abnormal   Collection Time: 03/01/17 12:34 AM  Result Value Ref Range   Glucose-Capillary 122 (H) 65 - 99 mg/dL   Comment 1 Notify RN   Renal function panel     Status: Abnormal   Collection Time: 03/01/17  5:00 AM  Result Value Ref Range   Sodium 142 135 - 145 mmol/L   Potassium 3.1 (L) 3.5 - 5.1 mmol/L   Chloride 96 (L) 101 - 111 mmol/L   CO2 39 (H) 22 - 32 mmol/L   Glucose, Bld 117 (H) 65 - 99 mg/dL   BUN 23 (H) 6 - 20 mg/dL   Creatinine, Ser 0.71 0.44 - 1.00 mg/dL   Calcium 7.9 (L) 8.9 - 10.3 mg/dL   Phosphorus 3.2 2.5 - 4.6 mg/dL   Albumin 2.8 (L) 3.5 - 5.0 g/dL   GFR calc non Af Amer >60 >60 mL/min   GFR calc Af Amer >60 >60 mL/min    Comment: (NOTE) The eGFR has been calculated using the CKD EPI equation. This calculation has not been validated in all clinical situations. eGFR's persistently <60 mL/min signify possible Chronic Kidney Disease.    Anion gap 7 5 - 15  CBC with Differential/Platelet     Status: Abnormal   Collection Time: 03/01/17  5:00 AM  Result Value Ref Range   WBC 6.4 4.0 - 10.5 K/uL   RBC 2.88 (L) 3.87 - 5.11 MIL/uL   Hemoglobin 9.5 (L) 12.0 - 15.0 g/dL   HCT 29.8 (L) 36.0 -  46.0 %   MCV 103.5 (H) 78.0 - 100.0 fL   MCH 33.0 26.0 - 34.0 pg   MCHC 31.9 30.0 - 36.0 g/dL   RDW 15.2 11.5 - 15.5 %   Platelets 212 150 - 400 K/uL   Neutrophils Relative % 61 %   Neutro Abs 3.9 1.7 - 7.7 K/uL   Lymphocytes Relative 32 %   Lymphs Abs 2.1 0.7 - 4.0 K/uL   Monocytes Relative 6 %   Monocytes Absolute 0.4 0.1 - 1.0 K/uL   Eosinophils Relative 1 %   Eosinophils Absolute 0.1 0.0 - 0.7 K/uL   Basophils Relative 0 %   Basophils Absolute 0.0 0.0 - 0.1 K/uL  Magnesium     Status: None   Collection Time: 03/01/17  5:00 AM  Result Value Ref Range   Magnesium 2.4 1.7 - 2.4 mg/dL  Glucose, capillary     Status: Abnormal   Collection Time: 03/01/17  7:23 AM  Result Value Ref Range   Glucose-Capillary 135 (H) 65 - 99 mg/dL    Recent Results (from the past 240 hour(s))  Blood Culture (routine x 2)     Status: None   Collection Time: 02/22/17 12:56 PM  Result Value Ref Range Status   Specimen Description BLOOD  RIGHT PICC LINE  Final   Special Requests IN PEDIATRIC BOTTLE Blood Culture adequate volume  Final   Culture NO GROWTH 5 DAYS  Final   Report Status 02/27/2017 FINAL  Final  Culture, respiratory (NON-Expectorated)     Status: None   Collection Time: 02/22/17  5:31 PM  Result Value Ref Range Status   Specimen Description TRACHEAL ASPIRATE  Final   Special Requests NONE  Final   Gram Stain   Final    ABUNDANT WBC PRESENT,BOTH PMN AND MONONUCLEAR NO ORGANISMS SEEN    Culture RARE METHICILLIN RESISTANT STAPHYLOCOCCUS AUREUS  Final   Report Status 02/27/2017 FINAL  Final   Organism ID, Bacteria METHICILLIN RESISTANT STAPHYLOCOCCUS AUREUS  Final      Susceptibility   Methicillin resistant staphylococcus aureus - MIC*    CIPROFLOXACIN >=8 RESISTANT Resistant     ERYTHROMYCIN >=8 RESISTANT Resistant     GENTAMICIN <=0.5 SENSITIVE Sensitive     OXACILLIN >=4 RESISTANT Resistant     TETRACYCLINE <=1 SENSITIVE Sensitive     VANCOMYCIN <=0.5 SENSITIVE Sensitive      TRIMETH/SULFA <=10 SENSITIVE Sensitive     CLINDAMYCIN <=0.25 SENSITIVE Sensitive     RIFAMPIN <=0.5 SENSITIVE Sensitive     Inducible Clindamycin NEGATIVE Sensitive     * RARE METHICILLIN RESISTANT STAPHYLOCOCCUS AUREUS  Blood Culture (routine x 2)     Status: None   Collection Time: 02/22/17  8:40 PM  Result Value Ref Range Status   Specimen Description BLOOD RIGHT HAND  Final   Special Requests IN PEDIATRIC BOTTLE Blood Culture adequate volume  Final   Culture NO GROWTH 5 DAYS  Final   Report Status 02/28/2017 FINAL  Final  MRSA PCR Screening     Status: Abnormal   Collection Time: 02/22/17  9:09 PM  Result Value Ref Range Status   MRSA by PCR POSITIVE (A) NEGATIVE Final    Comment:        The GeneXpert MRSA Assay (FDA approved for NASAL specimens only), is one component of a comprehensive MRSA colonization surveillance program. It is not intended to diagnose MRSA infection nor to guide or monitor treatment for MRSA infections. RESULT CALLED TO, READ BACK BY AND VERIFIED WITH: RN C [place] [id number]     Lipid Panel Recent Labs    02/28/17 1522  TRIG 164*    Studies/Results: Dg Chest Port 1 View  Result Date: 03/01/2017 CLINICAL DATA:  Hypoxia EXAM: PORTABLE CHEST 1 VIEW COMPARISON:  February 28, 2017 FINDINGS: Endotracheal tube tip is 0.9 cm above the carina. Central catheter tip is in the superior vena cava. Nasogastric tube tip and side port below the diaphragm. No pneumothorax. There is mild airspace opacity in the left mid lung and left base regions. Lungs elsewhere are clear. Heart is upper normal in size with pulmonary vascularity within normal limits. No adenopathy. 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 There is evidence of prior fracture of the proximal left humerus with remodeling. Bones are osteoporotic. IMPRESSION: Tube and catheter positions as described without pneumothorax. Patchy airspace opacity in the left mid lung and left base, likely foci  of pneumonia. Lungs elsewhere clear. Stable cardiac silhouette. Healing fracture proximal left humerus, stable. Electronically Signed   By: Lowella Grip III M.D.   On: 03/01/2017 07:10   Dg Chest Port 1 View  Result Date: 02/28/2017 CLINICAL DATA:  Respiratory failure EXAM: PORTABLE CHEST 1 VIEW COMPARISON:  Yesterday FINDINGS: Endotracheal tube tip 13 mm above the  carina. An orogastric tube reaches the stomach. Right IJ central line with tip at the SVC level. Reticular opacity symmetrically at the bases, stable. No effusion or pneumothorax. Normal heart size. Healing proximal left humerus fracture. Right glenohumeral arthroplasty. IMPRESSION: 1. Endotracheal tube tip 13 mm above the carina. 2. Stable interstitial type opacities at the bases of the low volume lungs which could be atelectasis or infection. Electronically Signed   By: Monte Fantasia M.D.   On: 02/28/2017 08:23    MRI Brain WO Contrast 02/26/2017 IMPRESSION: 1. Stable and without explanation for symptoms. 2. Mild periventricular white matter disease. 3. Sinusitis with ethmoid and sphenoid fluid levels, also seen on prior. Bilateral partial mastoid opacification.   Cervical MRI 02/26/2017 1. Prominent edema in the bilateral neck with retropharyngeal collection. The submandibular glands appears thickened and edematous and this may be submandibular sialadenitis (bilaterality suggesting viral/systemic process). Suppurative infection is not excluded. 2. No acute finding to explain left arm deficit. 3. Disc and facet degeneration as described. Spinal stenosis with mild cord flattening at C5-6. Foraminal narrowings noted above.    Medications:  Prior to Admission:  Medications Prior to Admission  Medication Sig Dispense Refill Last Dose  . ADVAIR DISKUS 250-50 MCG/DOSE AEPB Take 1 puff by mouth daily.   LF 01-12-17 at 30DS  . baclofen (LIORESAL) 10 MG tablet Take 10 mg by mouth 3 (three) times daily as needed for muscle  spasms.   LF 01-12-17 at 90DS  . gabapentin (NEURONTIN) 800 MG tablet Take 800 mg 3 (three) times daily by mouth.    LF 01-30-17 at 30DS  . LORazepam (ATIVAN) 1 MG tablet Take 1 tablet (1 mg total) by mouth 2 (two) times daily as needed for anxiety. 10 tablet 0 LF 01-12-17 at 30DS  . PROAIR HFA 108 (90 Base) MCG/ACT inhaler Inhale 1-2 puffs every 4 (four) hours as needed into the lungs for shortness of breath.   LF 01-31-17 at 90DS  . temazepam (RESTORIL) 30 MG capsule Take 30 mg by mouth at bedtime as needed for sleep.   LF 01-12-17 at 30DS  . atorvastatin (LIPITOR) 20 MG tablet Take 20 mg by mouth at bedtime. Take one tablet daily for hyperlipidemia     at [facility]  . benztropine (COGENTIN) 2 MG tablet Take 1 tablet (2 mg total) by mouth 3 (three) times daily. 90 tablet 0  at [facility]  . ondansetron (ZOFRAN) 8 MG tablet Take 1 tablet (8 mg total) by mouth every 8 (eight) hours as needed for nausea or vomiting. 12 tablet 0  at [facility]  . venlafaxine XR (EFFEXOR-XR) 150 MG 24 hr capsule Take 300 mg by mouth daily with breakfast.   LF 12-07-16 at 30DS  . [DISCONTINUED] ARIPiprazole (ABILIFY) 20 MG tablet Take 1 tablet (20 mg total) by mouth daily. (Patient not taking: Reported on 02/22/2017) 15 tablet 0 LF 12-07-16 at 30DS   Scheduled: . ARIPiprazole  20 mg Per Tube Daily  . aspirin  81 mg Per Tube Daily  . atorvastatin  20 mg Per Tube QHS  . chlorhexidine gluconate (MEDLINE KIT)  15 mL Mouth Rinse BID  . folic acid  1 mg Intravenous Daily  . heparin  5,000 Units Subcutaneous Q8H  . hydrocortisone sod succinate (SOLU-CORTEF) inj  50 mg Intravenous Q12H  . ipratropium-albuterol  3 mL Nebulization Q6H  . levothyroxine  50 mcg Intravenous Daily  . mouth rinse  15 mL Mouth Rinse QID  . pantoprazole sodium  40 mg Per  Tube Q24H  . thiamine injection  100 mg Intravenous Daily    Assessment:  66 year old female found down with episode of posturing versus seizure.   1. The EEG had a pattern which is most likely  non-ictal, but which could represent post-ictal changes (e.g. "burned out status") this improved with time.  2. With her elevated TSH, myxedema coma is also a consideration  3. Overdose on neuroleptics also could give this picture, but home medication aripiprazole listed as not being taken in Epic medications list.  4. MRI brain obtained to evaluate decreased movement of LUE, which was improving, revealed no acute abnormality. Mild periventricular white matter disease was noted. 5. MRI cervical spine reveals disc and facet degeneration; there is spinal stenosis with mild cord flattening at C5-6. Foraminal narrowings also noted. There are no acute findings to explain left arm deficit. 6. Also noted on MRI C-spine: Prominent edema in the bilateral neck with retropharyngeal collection. The submandibular glands appears thickened and edematous and this may be submandibular sialadenitis (bilaterality suggesting viral/systemic process). Suppurative infection is not excluded.  7. Improving EEG patterns from 11/15-11/16 parallel her clinical improvement on prior documented neurological exams: EEG 11/15: This was a markedly abnormal continuous video EEG due to loss of normal background, diffuse sharply contoured slowing, and a partial burst-suppression pattern. This was indicative of a severe global cerebral disturbance with periods of attenuation likely due to medications. No seizures were seen. EEG 11/16: This was a moderatelyabnormal continuous video EEG due to loss of normal background, diffuse sharply contoured slowing (resolved), and a partial burst-suppression pattern. This was indicative of an improving global cerebral disturbance. No seizures or definite epileptiform discharges were seen, however sharp activity was present early in the record.  8. Acute renal failure from ATN, now resolved. Initial electrolyte disturbances, now resolved. Currently volume overloaded.  9. Adrenal insufficiency. Currently  being treated with solucortef. 10. Acute respiratory failure with hypoxia and compromised airway. Also with aspiration pneumonia. Currently intubated.  Recommendations: 1. Continue on current  Keppra 1,000 mg Q 12 hrs and valproic acid 500 mg Q 8 hrs. 2. Continue synthroid 50 mcg daily. 3. Agree with continuing to hold outpatient Neurontin, baclofen, Ativan, Restoril, Cogentin and Effexor 4. CIWA protocol 5. Attempt to wean off propofol. Call Neurology for repeat examination after she has been weaned. Exam essentially unchanged from 11/19.   LOS: 7 days   Mikey Bussing PA-C [facility] Pager [phone] 03/01/2017, 9:23 AM

## 2017-03-01 NOTE — Progress Notes (Signed)
Continued agitation, patient continuously trying to pull at ETT. MD made aware, order for restraints placed.

## 2017-03-01 NOTE — Progress Notes (Signed)
PULMONARY / CRITICAL CARE MEDICINE   Name: Dominique Acosta MRN: [id number] DOB: Feb 09, 1951    ADMISSION DATE:  02/22/2017  REFERRING MD:  EDP  CHIEF COMPLAINT:  AMS  HISTORY OF PRESENT ILLNESS:   66 yo female former smoker called EMS and the found unresponsive with agonal respirations and hypothermia at home.  Required intubation for airway protection. PMHx of COPD, CAD, Depression, Schizophrenia, dystonia, seizures, chronic pain, substance abuse, HLD, Hypothyroidism.  SUBJECTIVE: No acute issues.  Continued to have agitation overnight.   VITAL SIGNS: BP (!) 90/52   Pulse (!) 56   Temp 98 F (36.7 C) (Oral)   Resp 14   Ht 5\' 1"  (1.549 m)   Wt 87.8 kg (193 lb 9 oz)   SpO2 95%   BMI 36.57 kg/m   VENTILATOR SETTINGS: Vent Mode: PRVC FiO2 (%):  [40 %] 40 % Set Rate:  [14 bmp] 14 bmp Vt Set:  [420 mL] 420 mL PEEP:  [5 cmH20] 5 cmH20 Pressure Support:  [8 cmH20] 8 cmH20 Plateau Pressure:  [16 cmH20] 16 cmH20  INTAKE / OUTPUT: I/O last 3 completed shifts: In: 3321.7 [I.V.:926.7; NG/GT:1585; IV HYWVPXTGG:269] Out: 4854 [OEVOJ:5009; Emesis/NG output:100]  PHYSICAL EXAMINATION:  General: Adult female, no distress HEENT: MM pink/moist endotracheal tube in place Neuro: sedated, does not follow commands CV: Heart sounds are regular regular rate and rhythm  PULM: Decreased breath sounds to the bases FG:HWEX, non-tender, bsx4 active  Extremities: warm/dry, 1+ edema lower extremity foot drop noted  Skin: no rashes or lesions   LABS:  BMET Recent Labs  Lab 02/27/17 0505 02/28/17 0445 03/01/17 0500  NA 142 143 142  K 3.8 3.3* 3.1*  CL 103 97* 96*  CO2 31 36* 39*  BUN 13 19 23*  CREATININE 0.80 0.79 0.71  GLUCOSE 143* 128* 117*    Electrolytes Recent Labs  Lab 02/24/17 1716  02/27/17 0505 02/28/17 0445 03/01/17 0500  CALCIUM  --    < > 8.3* 8.3* 7.9*  MG 2.2  --   --  2.3 2.4  PHOS 2.1*  --   --  3.1 3.2   < > = values in this interval not displayed.     CBC Recent Labs  Lab 02/27/17 0505 02/28/17 0445 03/01/17 0500  WBC 6.9 8.1 6.4  HGB 10.4* 10.5* 9.5*  HCT 30.4* 32.5* 29.8*  PLT 275 269 212    Coag's Recent Labs  Lab 02/22/17 2029  APTT 28  INR 1.07    Sepsis Markers Recent Labs  Lab 02/22/17 1314 02/22/17 2029 02/22/17 2356 02/24/17 0608  LATICACIDVEN 0.73  --   --   --   PROCALCITON  --  <0.10 <0.10 <0.10    ABG Recent Labs  Lab 02/23/17 0500  PHART 7.361  PCO2ART 47.6  PO2ART 70.6*    Liver Enzymes Recent Labs  Lab 02/22/17 1256 02/24/17 0608 02/28/17 0445 03/01/17 0500  AST 29 23  --   --   ALT 28 20  --   --   ALKPHOS 62 58  --   --   BILITOT 1.2 0.6  --   --   ALBUMIN 3.1* 2.7* 2.7* 2.8*    Cardiac Enzymes Recent Labs  Lab 02/22/17 2029 02/22/17 2356  TROPONINI <0.03 <0.03    Glucose Recent Labs  Lab 02/28/17 0847 02/28/17 1256 02/28/17 1551 02/28/17 1914 03/01/17 0034 03/01/17 0723  GLUCAP 118* 107* 108* 143* 122* 135*    Lab Results  Component Value Date  TSH 51.322 (H) 02/23/2017     Imaging Dg Chest Port 1 View  Result Date: 03/01/2017 CLINICAL DATA:  Hypoxia EXAM: PORTABLE CHEST 1 VIEW COMPARISON:  February 28, 2017 FINDINGS: Endotracheal tube tip is 0.9 cm above the carina. Central catheter tip is in the superior vena cava. Nasogastric tube tip and side port below the diaphragm. No pneumothorax. There is mild airspace opacity in the left mid lung and left base regions. Lungs elsewhere are clear. Heart is upper normal in size with pulmonary vascularity within normal limits. No adenopathy. 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 There is evidence of prior fracture of the proximal left humerus with remodeling. Bones are osteoporotic. IMPRESSION: Tube and catheter positions as described without pneumothorax. Patchy airspace opacity in the left mid lung and left base, likely foci of pneumonia. Lungs elsewhere clear. Stable cardiac silhouette. Healing fracture  proximal left humerus, stable. Electronically Signed   By: Lowella Grip III M.D.   On: 03/01/2017 07:10   STUDIES:  CT head 11/14 >> no acute findings EEG 11/14 >> diffuse slowing and partial burst suppression pattern MRI brain 11/15 >> atrophy and small vessel disease  CULTURES: BC x 2 11/14>>> Trach asp 11/14 >> Few Staph aureus >> rare MRSA Legionella Ag 11/14 >> negative Pneumococcal Ag 11/14 >> negative  ANTIBIOTICS: vancomycin 11/14>>> Levaquin 11/14 >>> 11/15 Aztreonam 11/14>>> 11/15 Cefepime 11/15 >> 11/18  SIGNIFICANT EVENTS: 11/14 Admit, neurology consulted 11/15 continuous EEG  LINES/TUBES: ETT 11/14>>> Rt IJ CVL 11/15>>>  DISCUSSION: 66 yo with acute respiratory failure, altered mental status, and hypothermia.  She has hx of hypothyroidism, schizophrenia, chronic pain, and polysubstance abuse.  It is uncertain whether she has been taking synthroid.  She also has aspiration pneumonia.  Continue to wean as tolerated she is a DNR therefore will be a one-way extubation.  She has history of cervical edema but has full range of motion of her neck at this time.  ASSESSMENT / PLAN:  Acute respiratory failure with hypoxia and compromised airway. Hx of COPD. - pressure support wean - continue for now, can consider extubation if she continues to do well .  Mental status is barrier to extubation.  She is a DNR therefore it would be a one-way extubation when done   - holding lasix for now given contraction alkalosis - f/u CXR - scheduled BDs  Aspiration pneumonia with Staph aureus with rare MRSA in sputum. - has PCN allergy listed, but tolerated cephalosporins previously - day 8 of Abx >> continue vancomycin  Hypothyroidism - ? Myxedema coma, doubtful. Adrenal insufficiency >> cortisol 14.1 from 11/15. - continue synthroid and solu cortef, started to wean solucortef to q12 on 95/18  Acute metabolic encephalopathy >> has some improvement 11/20.  Hx of depression,  chronic pain, schizophrenia, polysubstance abuse, dystonia, seizures. Tardive dyskinesia. - Sedation: Propofol gtt / Fentanyl PRN/ add Precedex 02/28/2017 and wean propofol - RASS goal 0 - AEDs per neurology  - Continue outpt abilify - hold outpt neurontin, baclofen, ativan, restoril, cogentin, effexor for now  Hx of CAD, HLD. - ASA, lipitor  Hypokalemia, hypophosphatemia, hypomagnesemia - resolved 11/19. Acute renal failure from ATN >> resolved. Volume overload - +8.3L net since admit Contraction alkalosis - correct electrolytes as indicated - KVO IV fluids - holding further lasix for now  DVT prophylaxis - SQ heparin SUP - protonix Nutrition - tube feeds Goals of care - DNR but intubated and with cervical issues and psych issues.  CC time: 30 min.  Montey Hora, [place] - C  Pulmonary & Critical Care Medicine Pager: [phone]  or [phone] 03/01/2017, 7:41 AM

## 2017-03-01 NOTE — Progress Notes (Signed)
Pharmacy Antibiotic Note  Dominique Acosta is a 66 y.o. female admitted on 02/22/2017 with sepsis.  Pharmacy has been consulted to dose vancomycin for MRSA PNA.  Previous vancomycin trough was high normal on 02/26/17 and treatment to extend to 10 days.  Patient's renal function is stabilizing.  He is afebrile and his WBC is WNL.   Plan: Continue vanc 750mg  IV Q12H Monitor renal fxn, clinical progress Repeat VT in AM   Height: 5\' 1"  (154.9 cm) Weight: 193 lb 9 oz (87.8 kg) IBW/kg (Calculated) : 47.8  Temp (24hrs), Avg:98.8 F (37.1 C), Min:97.9 F (36.6 C), Max:99.5 F (37.5 C)  Recent Labs  Lab 02/25/17 0454 02/25/17 2136 02/26/17 0429 02/26/17 1541 02/27/17 0505 02/28/17 0445 03/01/17 0500  WBC 12.2*  --  8.8  --  6.9 8.1 6.4  CREATININE 0.84 0.91 0.86  --  0.80 0.79 0.71  VANCOTROUGH  --   --   --  20  --   --   --     Estimated Creatinine Clearance: 68.7 mL/min (by C-G formula based on SCr of 0.71 mg/dL).    Allergies  Allergen Reactions  . Amoxicillin Anaphylaxis and Other (See Comments)    Childhood allergy >> tolerated cephalosporins  Has patient had a PCN reaction causing immediate rash, facial/tongue/throat swelling, SOB or lightheadedness with hypotension: Yes Has patient had a PCN reaction causing severe rash involving mucus membranes or skin necrosis: No Has patient had a PCN reaction that required hospitalization No Has patient had a PCN reaction occurring within the last 10 years: No If all of the above answers are "NO", then may proceed with Cephalosporin use.  Marland Kitchen Penicillins Anaphylaxis and Other (See Comments)    Childhood allergy Has patient had a PCN reaction causing immediate rash, facial/tongue/throat swelling, SOB or lightheadedness with hypotension: Yes Has patient had a PCN reaction causing severe rash involving mucus membranes or skin necrosis: No Has patient had a PCN reaction that required hospitalization No Has patient had a PCN reaction  occurring within the last 10 years: No If all of the above answers are "NO", then may proceed with Cephalosporin use.   Marland Kitchen Phenytoin Other (See Comments)    Reaction:  CNS disorder   . Keflex [Cephalexin] Diarrhea  . Dilaudid [Hydromorphone Hcl] Other (See Comments)    Reaction:  Psychosis   . Haldol [Haloperidol] Other (See Comments)    Reaction:  Psychosis   . Morphine And Related Other (See Comments)    Reaction:  Psychosis      Vanc 11/14 >> LVQ 11/14 >> 11/15 Azactam 11/14 >> 11/15 Cefepime 11/15 >>11/18  11/18 VT = 20 on 750mg  q12h, drawn ~56min early  11/14 MRSA PCR - positive 11/14 TA - rare MRSA (R-cipro, erythro; otherwise sens; Vanc MIC 0.5) 11/14  BCx - NGTD x4d   Stevie Charter D. Mina Marble, PharmD, BCPS Pager:  [phone] 03/01/2017, 2:22 PM

## 2017-03-02 ENCOUNTER — Inpatient Hospital Stay (HOSPITAL_COMMUNITY): Payer: Medicare Other

## 2017-03-02 LAB — GLUCOSE, CAPILLARY
GLUCOSE-CAPILLARY: 99 mg/dL (ref 65–99)
GLUCOSE-CAPILLARY: 89 mg/dL (ref 65–99)
GLUCOSE-CAPILLARY: 125 mg/dL — AB (ref 65–99)
Glucose-Capillary: 109 mg/dL — ABNORMAL HIGH (ref 65–99)
Glucose-Capillary: 115 mg/dL — ABNORMAL HIGH (ref 65–99)
Glucose-Capillary: 126 mg/dL — ABNORMAL HIGH (ref 65–99)
Glucose-Capillary: 128 mg/dL — ABNORMAL HIGH (ref 65–99)

## 2017-03-02 LAB — CBC WITH DIFFERENTIAL/PLATELET
BASOS ABS: 0 10*3/uL (ref 0.0–0.1)
Basophils Relative: 0 %
EOS PCT: 1 %
Eosinophils Absolute: 0.1 10*3/uL (ref 0.0–0.7)
HCT: 29.2 % — ABNORMAL LOW (ref 36.0–46.0)
HEMOGLOBIN: 9.4 g/dL — AB (ref 12.0–15.0)
Lymphocytes Relative: 26 %
Lymphs Abs: 1.8 10*3/uL (ref 0.7–4.0)
MCH: 33.5 pg (ref 26.0–34.0)
MCHC: 32.2 g/dL (ref 30.0–36.0)
MCV: 103.9 fL — AB (ref 78.0–100.0)
Monocytes Absolute: 0.5 10*3/uL (ref 0.1–1.0)
Monocytes Relative: 7 %
NEUTROS ABS: 4.6 10*3/uL (ref 1.7–7.7)
NEUTROS PCT: 66 %
PLATELETS: 198 10*3/uL (ref 150–400)
RBC: 2.81 MIL/uL — AB (ref 3.87–5.11)
RDW: 14.9 % (ref 11.5–15.5)
WBC: 7 10*3/uL (ref 4.0–10.5)

## 2017-03-02 LAB — RENAL FUNCTION PANEL
ALBUMIN: 3 g/dL — AB (ref 3.5–5.0)
ANION GAP: 11 (ref 5–15)
BUN: 24 mg/dL — AB (ref 6–20)
CHLORIDE: 95 mmol/L — AB (ref 101–111)
CO2: 34 mmol/L — ABNORMAL HIGH (ref 22–32)
Calcium: 8 mg/dL — ABNORMAL LOW (ref 8.9–10.3)
Creatinine, Ser: 0.7 mg/dL (ref 0.44–1.00)
Glucose, Bld: 172 mg/dL — ABNORMAL HIGH (ref 65–99)
PHOSPHORUS: 2.3 mg/dL — AB (ref 2.5–4.6)
POTASSIUM: 3.5 mmol/L (ref 3.5–5.1)
Sodium: 140 mmol/L (ref 135–145)

## 2017-03-02 LAB — PHOSPHORUS: Phosphorus: 2.7 mg/dL (ref 2.5–4.6)

## 2017-03-02 LAB — URINALYSIS, ROUTINE W REFLEX MICROSCOPIC
Bilirubin Urine: NEGATIVE
GLUCOSE, UA: NEGATIVE mg/dL
Hgb urine dipstick: NEGATIVE
Ketones, ur: NEGATIVE mg/dL
LEUKOCYTES UA: NEGATIVE
Nitrite: NEGATIVE
PH: 7 (ref 5.0–8.0)
Protein, ur: NEGATIVE mg/dL
Specific Gravity, Urine: 1.006 (ref 1.005–1.030)

## 2017-03-02 LAB — VANCOMYCIN, TROUGH: Vancomycin Tr: 18 ug/mL (ref 15–20)

## 2017-03-02 LAB — MAGNESIUM: Magnesium: 2.4 mg/dL (ref 1.7–2.4)

## 2017-03-02 MED ORDER — HYDROCORTISONE NA SUCCINATE PF 100 MG IJ SOLR
30.0000 mg | Freq: Every day | INTRAMUSCULAR | Status: DC
  Administered 2017-03-02 – 2017-03-03 (×2): 30 mg via INTRAVENOUS
  Filled 2017-03-02: qty 2
  Filled 2017-03-02 (×2): qty 0.6

## 2017-03-02 MED ORDER — ACETAMINOPHEN 160 MG/5ML PO SOLN
650.0000 mg | Freq: Four times a day (QID) | ORAL | Status: DC | PRN
  Administered 2017-03-02 – 2017-03-16 (×4): 650 mg
  Filled 2017-03-02 (×4): qty 20.3

## 2017-03-02 MED ORDER — HYDROCORTISONE NA SUCCINATE PF 100 MG IJ SOLR
20.0000 mg | Freq: Every day | INTRAMUSCULAR | Status: DC
  Administered 2017-03-03 – 2017-03-04 (×2): 20 mg via INTRAVENOUS
  Filled 2017-03-02 (×2): qty 0.4

## 2017-03-02 MED ORDER — FUROSEMIDE 10 MG/ML IJ SOLN
20.0000 mg | Freq: Two times a day (BID) | INTRAMUSCULAR | Status: AC
  Administered 2017-03-02 (×2): 20 mg via INTRAVENOUS
  Filled 2017-03-02 (×2): qty 2

## 2017-03-02 NOTE — Progress Notes (Signed)
Pharmacy Antibiotic Note  Dominique Acosta is a 66 y.o. female admitted on 02/22/2017 with sepsis.  Pharmacy has been consulted to dose vancomycin for MRSA PNA.  Previous vancomycin trough was high normal on 02/26/17 and treatment to extend to 10 days.    Vancomycin trough is 18 although drawn ~1 hr early, true trough ~17.  Plan: Continue vancomycin 750 mg IV q12h Monitor renal fxn, clinical progress   Height: 5\' 1"  (154.9 cm) Weight: 193 lb 9 oz (87.8 kg) IBW/kg (Calculated) : 47.8  Temp (24hrs), Avg:98 F (36.7 C), Min:97.6 F (36.4 C), Max:98.3 F (36.8 C)  Recent Labs  Lab 02/25/17 2136 02/26/17 0429 02/26/17 1541 02/27/17 0505 02/28/17 0445 03/01/17 0500 03/02/17 0330 03/02/17 0331  WBC  --  8.8  --  6.9 8.1 6.4 7.0  --   CREATININE 0.91 0.86  --  0.80 0.79 0.71  --   --   VANCOTROUGH  --   --  20  --   --   --   --  18    Estimated Creatinine Clearance: 68.7 mL/min (by C-G formula based on SCr of 0.71 mg/dL).    Allergies  Allergen Reactions  . Amoxicillin Anaphylaxis and Other (See Comments)    Childhood allergy >> tolerated cephalosporins  Has patient had a PCN reaction causing immediate rash, facial/tongue/throat swelling, SOB or lightheadedness with hypotension: Yes Has patient had a PCN reaction causing severe rash involving mucus membranes or skin necrosis: No Has patient had a PCN reaction that required hospitalization No Has patient had a PCN reaction occurring within the last 10 years: No If all of the above answers are "NO", then may proceed with Cephalosporin use.  Marland Kitchen Penicillins Anaphylaxis and Other (See Comments)    Childhood allergy Has patient had a PCN reaction causing immediate rash, facial/tongue/throat swelling, SOB or lightheadedness with hypotension: Yes Has patient had a PCN reaction causing severe rash involving mucus membranes or skin necrosis: No Has patient had a PCN reaction that required hospitalization No Has patient had a PCN reaction  occurring within the last 10 years: No If all of the above answers are "NO", then may proceed with Cephalosporin use.   Marland Kitchen Phenytoin Other (See Comments)    Reaction:  CNS disorder   . Keflex [Cephalexin] Diarrhea  . Dilaudid [Hydromorphone Hcl] Other (See Comments)    Reaction:  Psychosis   . Haldol [Haloperidol] Other (See Comments)    Reaction:  Psychosis   . Morphine And Related Other (See Comments)    Reaction:  Psychosis      Vanc 11/14 >> LVQ 11/14 >> 11/15 Azactam 11/14 >> 11/15 Cefepime 11/15 >>11/18  11/18 VT = 20 on 750mg  q12h, drawn ~28min early 11/22 VT = 18 on 750 mg q12h, drawn ~1 hr early  11/14 MRSA PCR - positive 11/14 TA - rare MRSA (R-cipro, erythro; otherwise sens; Vanc MIC 0.5) 11/14  BCx - NGTD x4d   Renold Genta, PharmD, [place] - [phone] 03/02/2017 4:11 AM

## 2017-03-02 NOTE — Progress Notes (Signed)
[facility] Attending Note  ADMISSION DATE:02/22/2017  REFERRING MD:Sam Winfred Leeds, M.D. / EDP  CHIEF COMPLAINT:Acute Encephalopathy  Presenting HPI:  66 y.o. female former smoker. Found unresponsive after she called EMS. Patient had agonal respirations and hyperthermia. Required endotracheal intubation for airway protection. Known history of COPD, coronary disease, depression, schizophrenia, dystonia, seizure disorder, substance abuse, chronic pain, and hypothyroidism. Patient has not been on treatment for her hypothyroidism.  Subjective:  No acute events overnight. Patient febrile this morning. Respiratory therapy also noted periods of apnea with attempted spontaneous breathing trial. Minimal tracheal secretions.   Review of Systems:  Unable to obtain given intubation & sedation.   Vent Mode: PRVC FiO2 (%):  [40 %] 40 % Set Rate:  [14 bmp] 14 bmp Vt Set:  [420 mL] 420 mL PEEP:  [5 cmH20] 5 cmH20 Plateau Pressure:  [16 cmH20-19 cmH20] 16 cmH20  Temp:  [97.6 F (36.4 C)-101.4 F (38.6 C)] 101.4 F (38.6 C) (11/22 0715) Pulse Rate:  [56-102] 102 (11/22 0600) Resp:  [5-24] 19 (11/22 0600) BP: (81-155)/(49-98) 141/98 (11/22 0600) SpO2:  [92 %-99 %] 98 % (11/22 0754) FiO2 (%):  [40 %] 40 % (11/22 0754) Weight:  [196 lb 3.4 oz (89 kg)] 196 lb 3.4 oz (89 kg) (11/22 0424)  General:  No family at bedside. Intubated. No distress. Integument:  Warm & dry. No rash on exposed skin. HEENT:  Moist mucus memebranes. No scleral icterus. Endotracheal tube in place. Neurological:  Pupils symmetric. Moving all 4 extremities. Not consistently following commands. On fentanyl and Precedex infusions. Musculoskeletal:  No joint effusion or erythema appreciated. Symmetric muscle bulk. Pulmonary:  Symmetric chest wall rise on ventilator. Clear breath sounds bilaterally. Cardiovascular:  Regular rate. No appreciable JVD.  Telemetry:  Sinus rhythm. Abdomen:  Soft.  Protuberant. Hypoactive bowel sounds.  LINES/TUBES: OETT 11/14 >>> R IJ CVL 11/15 >>> Foley >>> OGT >>> PIV  CBC Latest Ref Rng & Units 03/02/2017 03/01/2017 03/01/2017  WBC 4.0 - 10.5 K/uL 7.0 - 6.4  Hemoglobin 12.0 - 15.0 g/dL 9.4(L) 11.1(L) 9.5(L)  Hematocrit 36.0 - 46.0 % 29.2(L) 34.2(L) 29.8(L)  Platelets 150 - 400 K/uL 198 - 212    BMP Latest Ref Rng & Units 03/01/2017 02/28/2017 02/27/2017  Glucose 65 - 99 mg/dL 117(H) 128(H) 143(H)  BUN 6 - 20 mg/dL 23(H) 19 13  Creatinine 0.44 - 1.00 mg/dL 0.71 0.79 0.80  Sodium 135 - 145 mmol/L 142 143 142  Potassium 3.5 - 5.1 mmol/L 3.1(L) 3.3(L) 3.8  Chloride 101 - 111 mmol/L 96(L) 97(L) 103  CO2 22 - 32 mmol/L 39(H) 36(H) 31  Calcium 8.9 - 10.3 mg/dL 7.9(L) 8.3(L) 8.3(L)    Hepatic Function Latest Ref Rng & Units 03/01/2017 02/28/2017 02/24/2017  Total Protein 6.5 - 8.1 g/dL - - 5.6(L)  Albumin 3.5 - 5.0 g/dL 2.8(L) 2.7(L) 2.7(L)  AST 15 - 41 U/L - - 23  ALT 14 - 54 U/L - - 20  Alk Phosphatase 38 - 126 U/L - - 58  Total Bilirubin 0.3 - 1.2 mg/dL - - 0.6    IMAGING/STUDIES: UDS 11/14:  Negative  CONTINUOUS EEG 11/16:  This was amoderatelyabnormal continuous video EEG due to loss of normal background, diffuse sharply contoured slowing(resolved), and a partial burst-suppression pattern. This was indicative of an improvingglobal cerebral disturbance.No seizuresor definite epileptiform dischargeswere seen, however sharp activity was present early in the record. MRI BRAIN W/O 11/18: 1. Stable and without explanation for symptoms. 2. Mild periventricular white matter  disease. 3. Sinusitis with ethmoid and sphenoid fluid levels, also seen on prior. Bilateral partial mastoid opacification. MRI C-SPINE W/O 11/18: 1. Prominent edema in the bilateral neck with retropharyngeal collection. The submandibular glands appears thickened and edematous and this may be submandibular sialadenitis (bilaterality suggesting viral/systemic  process). Suppurative infection is not excluded. 2. No acute finding to explain left arm deficit. 3. Disc and facet degeneration as described. Spinal stenosis with mild cord flattening at C5-6. Foraminal narrowings noted above. PORT CXR 11/19:  Previously reviewed by me. Good positioning of endotracheal tube. Enteric feeding tube coursing below the diaphragm. Central venous catheter in superior vena cava. Questionable patchy opacity left lung base. No new focal opacity appreciated. PORT CXR 11/21:  Previously reviewed by me. Questionable hazy left midlung opacity. Silhouetting left hemidiaphragm. Endotracheal tube in the level of the carina. Low lung volumes. PORT CXR 11/22:  Personally reviewed by me. Minimal basilar opacity suggestive of atelectasis. Lordotic view. Endotracheal tube in good position. Right internal jugular central venous catheter in good position. Enteric feeding tube coursing below diaphragm.  MICROBIOLOGY: MRSA PCR :  Positive  Blood Cultures x2 11/14:  Negative  Tracheal Aspirate Culture 11/14:  MRSA Urine Streptococcal Antigen 11/14:  Negative  Urine Legionella Antigen 11/14:  Negative   ANTIBIOTICS: Levaquin 11/14 - 11/15 Aztreonam 11/14 - 11/15 Cefepime 11/15 - 11/18 Vancomycin 11/14 >>>  SIGNIFICANT EVENTS: 11/14 - Admit 11/15 - Continuous EEG 11/20 - Improving mentation on Precedex. No cuff leak. Tolerating PS 5/5.  11/22 - Patient apneic on sedation w/ SBT. FUO >> cultures repeated.  ASSESSMENT/PLAN:  66 y.o. female with history of psychiatric illness. Known history of schizophrenia as well as polysubstance abuse. History of untreated hypothyroidism. Presenting with altered mentation. Under treatment for MRSA pneumonia.  1. Acute hypoxic respiratory failure: Secondary to compromised airway & pneumonia. Continuing intermittent Lasix for diuresis. Weaning FiO2. Daily spontaneous breathing trial. 2. Acute encephalopathy: Continuing Precedex infusion.  Continuing thiamine via tube daily. Unclear etiology. 3. Hypothyroidism: Continuing IV Synthroid daily. Picture not consistent with myxedema coma.  4. Adrenal insufficiency: Switching to hydrocortisone 20 mg IV every morning & 30 mg IV daily at bedtime. 5. MRSA pneumonia: Currently on day #9/10 of vancomycin. Plan to reculture for fever. 6. FUO: Trending Procalcitonin per algorithm. Repeating blood cultures, tracheal aspirate culture, urine culture, and urinalysis. Tylenol when necessary for fever control. 7. History of schizophrenia & depression: Continuing Abilify. Holding baclofen, Ativan, Effexor, Cogentin, and Restoril. 8. Epilepsy: Continuing Keppra & Depakote. Neurology following. Holding outpatient Neurontin. 9. Chronic pain: Continuing fentanyl IV as needed. Holding outpatient Neurontin. 10. Polysubstance abuse: Continuing thiamine and folic acid via tube daily. Continuing Precedex infusion. 11. COPD: No signs of acute exacerbation. Continuing Duonebs every 6 hours. 12. Retropharyngeal edema: Suggested on MRI above. Continuing Solu-Cortef. Checking for cuff leak daily. May require extubation in the OR or ENT evaluation prior to extubation.  Prophylaxis:  Heparin [place] q8hr, SCDs, & Protonix via tube qhs. Diet:  NPO. Tube Feedings per dietary recommendations. Code Status:  Full Code. Disposition:  Remains critically ill on ventilator.  Family Update:  Sister previously updated via phone.  I have personally spent a total of 31 minutes of critical care time today caring for the patient & reviewing the patient's electronic medical record.  Sonia Baller Ashok Cordia, M.D. [facility] Pulmonary & Critical Care Pager:  [phone] After 7pm or if no response, call [phone] 8:27 AM 03/02/17

## 2017-03-03 ENCOUNTER — Inpatient Hospital Stay (HOSPITAL_COMMUNITY): Payer: Medicare Other

## 2017-03-03 DIAGNOSIS — J9691 Respiratory failure, unspecified with hypoxia: Secondary | ICD-10-CM

## 2017-03-03 LAB — BASIC METABOLIC PANEL
Anion gap: 10 (ref 5–15)
BUN: 20 mg/dL (ref 6–20)
CO2: 38 mmol/L — ABNORMAL HIGH (ref 22–32)
Calcium: 7.9 mg/dL — ABNORMAL LOW (ref 8.9–10.3)
Chloride: 87 mmol/L — ABNORMAL LOW (ref 101–111)
Creatinine, Ser: 0.67 mg/dL (ref 0.44–1.00)
GFR calc Af Amer: >60 mL/min (ref >60)
Glucose, Bld: 133 mg/dL — ABNORMAL HIGH (ref 65–99)
Potassium: 3.1 mmol/L — ABNORMAL LOW (ref 3.5–5.1)
Sodium: 135 mmol/L (ref 135–145)

## 2017-03-03 LAB — GLUCOSE, CAPILLARY
GLUCOSE-CAPILLARY: 117 mg/dL — AB (ref 65–99)
GLUCOSE-CAPILLARY: 116 mg/dL — AB (ref 65–99)
GLUCOSE-CAPILLARY: 119 mg/dL — AB (ref 65–99)
GLUCOSE-CAPILLARY: 163 mg/dL — AB (ref 65–99)

## 2017-03-03 LAB — CBC WITH DIFFERENTIAL/PLATELET
BASOS PCT: 0 %
Basophils Absolute: 0 10*3/uL (ref 0.0–0.1)
Eosinophils Absolute: 0.1 10*3/uL (ref 0.0–0.7)
Eosinophils Relative: 1 %
HEMATOCRIT: 31.6 % — AB (ref 36.0–46.0)
HEMOGLOBIN: 10 g/dL — AB (ref 12.0–15.0)
Lymphocytes Relative: 17 %
Lymphs Abs: 1.5 10*3/uL (ref 0.7–4.0)
MCH: 31.5 pg (ref 26.0–34.0)
MCHC: 31.6 g/dL (ref 30.0–36.0)
MCV: 99.7 fL (ref 78.0–100.0)
MONOS PCT: 9 %
Monocytes Absolute: 0.8 10*3/uL (ref 0.1–1.0)
NEUTROS ABS: 6.8 10*3/uL (ref 1.7–7.7)
NEUTROS PCT: 73 %
Platelets: 206 10*3/uL (ref 150–400)
RBC: 3.17 MIL/uL — ABNORMAL LOW (ref 3.87–5.11)
RDW: 14.1 % (ref 11.5–15.5)
WBC: 9.3 10*3/uL (ref 4.0–10.5)

## 2017-03-03 LAB — RENAL FUNCTION PANEL
ALBUMIN: 3 g/dL — AB (ref 3.5–5.0)
Anion gap: 11 (ref 5–15)
BUN: 21 mg/dL — ABNORMAL HIGH (ref 6–20)
CO2: 39 mmol/L — ABNORMAL HIGH (ref 22–32)
Calcium: 8 mg/dL — ABNORMAL LOW (ref 8.9–10.3)
Chloride: 90 mmol/L — ABNORMAL LOW (ref 101–111)
Creatinine, Ser: 0.7 mg/dL (ref 0.44–1.00)
GLUCOSE: 113 mg/dL — AB (ref 65–99)
PHOSPHORUS: 2.6 mg/dL (ref 2.5–4.6)
POTASSIUM: 3.1 mmol/L — AB (ref 3.5–5.1)
Sodium: 140 mmol/L (ref 135–145)

## 2017-03-03 LAB — URINE CULTURE
CULTURE: NO GROWTH
Special Requests: NORMAL

## 2017-03-03 LAB — MAGNESIUM: Magnesium: 2.2 mg/dL (ref 1.7–2.4)

## 2017-03-03 MED ORDER — SODIUM CHLORIDE 0.9% FLUSH
10.0000 mL | INTRAVENOUS | Status: DC | PRN
  Administered 2017-03-03: 10 mL
  Administered 2017-03-07: 20 mL
  Administered 2017-03-12: 10 mL
  Filled 2017-03-03 (×3): qty 40

## 2017-03-03 MED ORDER — FUROSEMIDE 10 MG/ML IJ SOLN
40.0000 mg | Freq: Four times a day (QID) | INTRAMUSCULAR | Status: AC
  Administered 2017-03-03 (×2): 40 mg via INTRAVENOUS
  Filled 2017-03-03 (×3): qty 4

## 2017-03-03 MED ORDER — ONDANSETRON HCL 4 MG/2ML IJ SOLN: 4.0000 mg | Freq: Four times a day (QID) | INTRAMUSCULAR | Status: DC | PRN

## 2017-03-03 MED ORDER — POTASSIUM CHLORIDE 10 MEQ/50ML IV SOLN
10.0000 meq | INTRAVENOUS | Status: AC
  Administered 2017-03-03 – 2017-03-04 (×6): 10 meq via INTRAVENOUS
  Filled 2017-03-03 (×6): qty 50

## 2017-03-03 MED ORDER — POTASSIUM CHLORIDE 20 MEQ/15ML (10%) PO SOLN
60.0000 meq | Freq: Once | ORAL | Status: AC
  Administered 2017-03-03: 60 meq
  Filled 2017-03-03: qty 45

## 2017-03-03 MED ORDER — SODIUM CHLORIDE 0.9% FLUSH
10.0000 mL | Freq: Two times a day (BID) | INTRAVENOUS | Status: DC
  Administered 2017-03-03 – 2017-03-04 (×2): 10 mL
  Administered 2017-03-04: 20 mL
  Administered 2017-03-05 – 2017-03-06 (×2): 10 mL
  Administered 2017-03-06: 20 mL
  Administered 2017-03-07 – 2017-03-15 (×9): 10 mL

## 2017-03-03 MED ORDER — CHLORHEXIDINE GLUCONATE CLOTH 2 % EX PADS
6.0000 | MEDICATED_PAD | Freq: Every day | CUTANEOUS | Status: DC
  Administered 2017-03-03 – 2017-03-17 (×11): 6 via TOPICAL

## 2017-03-03 NOTE — Care Management Important Message (Signed)
Important Message  Patient Details  Name: Dominique Acosta MRN: [id number] Date of Birth: Aug 08, 1950   Medicare Important Message Given:  Yes    Tesha Archambeau, Rory Percy, RN 03/03/2017, 12:15 PM

## 2017-03-03 NOTE — Progress Notes (Signed)
PCCM Attending Re-Rounding Note:  Patient has tolerated >45 minutes on PS 0 with PEEP 5 and FiO2 0.4. RR is 18 with Saturation about 97%. Hemodynamically patient is stable. However, she has NO cuff leak on bedside assessment with full ventilator support. Called and updated the patient's sister regarding her clinical progress and my concerns regarding retropharyngeal edema. I am treating the patient with Lasix IV today and hopefully will be able to make progress. If the patient has no cuff leak tomorrow I will plan on consulting ENT for evaluation & possible extubation assistance.  Sonia Baller Ashok Cordia, M.D. [facility] Pulmonary & Critical Care Pager:  [phone] After 7pm or if no response, call [phone] 3:35 PM 03/03/17

## 2017-03-03 NOTE — Procedures (Signed)
EEG Report  Clinical History:  Syncope with T102.    Technical Summary:  A 19 channel digital EEG recording was performed using the 10-20 international system of electrode placement.  Bipolar and Referential montages were used.  The total recording time was approx 20 minutes. No sedation.  Findings:  There is no posterior dominant alpha rhythm.  Background frequencies are about 7 Hz at maximum.  No focal slowing is present. The patient is agitated and uncooperative during the recording.  Muscle artifact is abundant due to the agitation.  She does not get drowsy or sleep.  Throughout the recording there are no epileptiform discharges or electrographic seizures present.  Impression:  This is an abnormal EEG.  There is mild generalized slowing of brain activity, which is non-specific, but may be due to toxic, metabolic, infectious, or hypoxic etiologies.  Clinical correlation is recommended.  The patient is not in non-convulsive status epilepticus.  Rogue Jury, MS, MD

## 2017-03-03 NOTE — Progress Notes (Signed)
[facility] Attending Note  ADMISSION DATE:02/22/2017  REFERRING MD:Sam Winfred Leeds, M.D. / EDP  CHIEF COMPLAINT:Acute Encephalopathy  Presenting HPI:  66 y.o. female former smoker. Found unresponsive after she called EMS. Patient had agonal respirations and hyperthermia. Required endotracheal intubation for airway protection. Known history of COPD, coronary disease, depression, schizophrenia, dystonia, seizure disorder, substance abuse, chronic pain, and hypothyroidism. Patient has not been on treatment for her hypothyroidism.  Subjective:  No acute events overnight. Patient more cooperative and awake this morning.  Review of Systems:  Unable to obtain given intubation & sedation.   Vent Mode: CPAP;PSV FiO2 (%):  [40 %] 40 % Set Rate:  [14 bmp] 14 bmp Vt Set:  [420 mL] 420 mL PEEP:  [5 cmH20] 5 cmH20 Pressure Support:  [8 cmH20] 8 cmH20 Plateau Pressure:  [18 cmH20-20 cmH20] 18 cmH20  Temp:  [97.2 F (36.2 C)-99.6 F (37.6 C)] 99.4 F (37.4 C) (11/23 0719) Pulse Rate:  [58-81] 81 (11/23 1115) Resp:  [12-16] 16 (11/23 1115) BP: (86-151)/(51-92) 139/84 (11/23 1115) SpO2:  [91 %-98 %] 96 % (11/23 1115) FiO2 (%):  [40 %] 40 % (11/23 1115) Weight:  [194 lb 7.1 oz (88.2 kg)] 194 lb 7.1 oz (88.2 kg) (11/23 0500)  Gen.: No distress. Awake. Integument: Warm. Dry. Without rash. Neurological: Moving all 4 extremities. Following commands. Attends to voice. Nods with questions. Cardiovascular: Regular rhythm. No JVD appreciated. No edema.  Pulmonary: Normal work of breathing on pressure support 8/5. Symmetric chest wall expansion. Minimal coarse wheeze.  Abdomen: Soft. Protuberant. Normal bowel sounds.   LINES/TUBES: OETT 11/14 >>> R IJ CVL 11/15 >>> Foley >>> OGT >>> PIV  CBC Latest Ref Rng & Units 03/03/2017 03/02/2017 03/01/2017  WBC 4.0 - 10.5 K/uL 9.3 7.0 -  Hemoglobin 12.0 - 15.0 g/dL 10.0(L) 9.4(L) 11.1(L)  Hematocrit 36.0 - 46.0 % 31.6(L)  29.2(L) 34.2(L)  Platelets 150 - 400 K/uL 206 198 -    BMP Latest Ref Rng & Units 03/03/2017 03/02/2017 03/01/2017  Glucose 65 - 99 mg/dL 113(H) 172(H) 117(H)  BUN 6 - 20 mg/dL 21(H) 24(H) 23(H)  Creatinine 0.44 - 1.00 mg/dL 0.70 0.70 0.71  Sodium 135 - 145 mmol/L 140 140 142  Potassium 3.5 - 5.1 mmol/L 3.1(L) 3.5 3.1(L)  Chloride 101 - 111 mmol/L 90(L) 95(L) 96(L)  CO2 22 - 32 mmol/L 39(H) 34(H) 39(H)  Calcium 8.9 - 10.3 mg/dL 8.0(L) 8.0(L) 7.9(L)    Hepatic Function Latest Ref Rng & Units 03/03/2017 03/02/2017 03/01/2017  Total Protein 6.5 - 8.1 g/dL - - -  Albumin 3.5 - 5.0 g/dL 3.0(L) 3.0(L) 2.8(L)  AST 15 - 41 U/L - - -  ALT 14 - 54 U/L - - -  Alk Phosphatase 38 - 126 U/L - - -  Total Bilirubin 0.3 - 1.2 mg/dL - - -    IMAGING/STUDIES: UDS 11/14:  Negative  CONTINUOUS EEG 11/16:  This was amoderatelyabnormal continuous video EEG due to loss of normal background, diffuse sharply contoured slowing(resolved), and a partial burst-suppression pattern. This was indicative of an improvingglobal cerebral disturbance.No seizuresor definite epileptiform dischargeswere seen, however sharp activity was present early in the record. MRI BRAIN W/O 11/18: 1. Stable and without explanation for symptoms. 2. Mild periventricular white matter disease. 3. Sinusitis with ethmoid and sphenoid fluid levels, also seen on prior. Bilateral partial mastoid opacification. MRI C-SPINE W/O 11/18: 1. Prominent edema in the bilateral neck with retropharyngeal collection. The submandibular glands appears thickened and edematous and this may  be submandibular sialadenitis (bilaterality suggesting viral/systemic process). Suppurative infection is not excluded. 2. No acute finding to explain left arm deficit. 3. Disc and facet degeneration as described. Spinal stenosis with mild cord flattening at C5-6. Foraminal narrowings noted above. PORT CXR 11/19:  Previously reviewed by me. Good positioning of  endotracheal tube. Enteric feeding tube coursing below the diaphragm. Central venous catheter in superior vena cava. Questionable patchy opacity left lung base. No new focal opacity appreciated. PORT CXR 11/21:  Previously reviewed by me. Questionable hazy left midlung opacity. Silhouetting left hemidiaphragm. Endotracheal tube in the level of the carina. Low lung volumes. PORT CXR 11/22:  Previously reviewed by me. Minimal basilar opacity suggestive of atelectasis. Lordotic view. Endotracheal tube in good position. Right internal jugular central venous catheter in good position. Enteric feeding tube coursing below diaphragm. EEG 11/23 >>>  MICROBIOLOGY: MRSA PCR :  Positive  Blood Cultures x2 11/14:  Negative  Tracheal Aspirate Culture 11/14:  MRSA Urine Streptococcal Antigen 11/14:  Negative  Urine Legionella Antigen 11/14:  Negative  Tracheal Aspirate Culture 11/22 >>> Urine Culture 11/22:  Negative  Blood Cultures x2 11/22 >>>  ANTIBIOTICS: Levaquin 11/14 - 11/15 Aztreonam 11/14 - 11/15 Cefepime 11/15 - 11/18 Vancomycin 11/14 - 11/23   SIGNIFICANT EVENTS: 11/14 - Admit 11/15 - Continuous EEG 11/20 - Improving mentation on Precedex. No cuff leak. Tolerating PS 5/5.  11/22 - Patient apneic on sedation w/ SBT. FUO >> cultures repeated.  ASSESSMENT/PLAN:  67 y.o. female with history of psychiatric illness including polysubstance abuse and schizophrenia. Known history of untreated hypothyroidism. Mental status is steadily improving. Finishing course of antibiotics for MRSA pneumonia.  1. Acute hypoxic respiratory failure: Secondary to compromised airway and MRSA pneumonia. Continuing daily spontaneous breathing trial. Lasix 40 mg IV 2 doses today for diuresis.  2. Acute encephalopathy: Improving. Limiting sedation. Continuing thiamine via tube. Continuing Precedex infusion. 3. Hypothyroidism: Continuing IV Synthroid. Syndrome not consistent with myxedema coma. 4. MRSA pneumonia:  Currently on day #10/10 of vancomycin. 5. FUO: Repeat cultures pending. Finishing course of vancomycin today. Deferring empiric anabiotic's pending culture results.  6. Adrenal insufficiency: Continuing Solu-Cortef 20 mg IV every morning & 30 mg IV daily at bedtime. 7. History of schizophrenia & depression: Continuing Abilify. Holding baclofen, Ativan, Effexor, Cogentin, and Restoril. 8. Epilepsy: Continuing Keppra & Depakote. Neurology following. Holding outpatient Neurontin. 9. Chronic pain: Continuing fentanyl IV as needed. Holding outpatient Neurontin. 10. Polysubstance abuse: Continuing thiamine and folic acid via tube daily. Continuing Precedex infusion. 11. COPD: No signs of acute exacerbation. Continuing Duonebs every 6 hours. 12. Retropharyngeal edema: Suggested on MRI above. Continuing Solu-Cortef. Checking for cuff leak daily. May require extubation in the OR or ENT evaluation prior to extubation.  Prophylaxis:  Heparin Ackworth q8hr, SCDs, & Protonix via tube qhs. Diet:  NPO. Tube Feedings per dietary recommendations. Code Status:  Full Code. Disposition:  Remains critically ill on ventilator.  Family Update:  No family present today.   I have personally spent a total of 33 minutes of critical care time today caring for the patient & reviewing the patient's electronic medical record.  Sonia Baller Ashok Cordia, M.D. [facility] Pager:  [phone] After 7pm or if no response, call [phone] 12:47 PM 03/03/17

## 2017-03-03 NOTE — Progress Notes (Signed)
Subjective: 66 yo female former smoker presented via EMS with AMS. She initially alerted EMS on her own, but was found by EMS to be unresponsive with agonal respirations and hypothermia at home.  She required intubation for airway protection. She has a PMHx of COPD, CAD, Depression, Schizophrenia, dystonia, seizures (but not on seizure medications at home), chronic pain, substance abuse, HLD, Hypothyroidism.   Yesterday, she continued to exhibit symptoms of encephalopathy. Notes document gradual improvement during this admission.   Today, Wednesday - Pt remains sedated on fentanyl and precedex. The nurse attempted to decrease the sedating medications this morning; however, the patient became agitated and required Ativan for control. The Precedex was increased to its previous level. The patient remains somewhat sedated. She opens her eyes, follows minimal commands, but does not attempt to speak.  Friday 03/03/17 - The patient appears more alert. She makes eye contact and indicated that she wanted to be extubated but then would not follow even the simplest of commands. I spoke with the patient's nurse. The fentanyl was decreased this morning; however, the pt. is still agitated at times. She is on full dose Precedex. She moves her extremities spontaneously lifting both legs completely off the bed. Both upper extremities are restrained. Extubation plans are being discussed.  Objective: Current vital signs: BP (!) 154/86   Pulse 87   Temp 99.4 F (37.4 C) (Oral)   Resp 15   Ht '5\' 1"'$  (1.549 m)   Wt 194 lb 7.1 oz (88.2 kg)   SpO2 97%   BMI 36.74 kg/m  Vital signs in last 24 hours: Temp:  [97.2 F (36.2 C)-99.4 F (37.4 C)] 99.4 F (37.4 C) (11/23 0719) Pulse Rate:  [58-87] 87 (11/23 1500) Resp:  [12-17] 15 (11/23 1500) BP: (86-165)/(51-92) 154/86 (11/23 1500) SpO2:  [93 %-97 %] 97 % (11/23 1500) FiO2 (%):  [40 %] 40 % (11/23 1200) Weight:  [194 lb 7.1 oz (88.2 kg)] 194 lb 7.1 oz (88.2 kg)  (11/23 0500)  Intake/Output from previous day: 11/22 0701 - 11/23 0700 In: 3031.4 [I.V.:1266.4; NG/GT:1080; IV Piggyback:685] Out: 6789 [Urine:3420] Intake/Output this shift: Total I/O In: 1149.6 [I.V.:409.6; NG/GT:575; IV Piggyback:165] Out: 2750 [Urine:2750] Nutritional status: Diet NPO time specified  Neurologic Exam: Ment: The patient's eyes remained open throughout the visit although she did not track around the room. She followed no commands. She indicated by motioning with her head and eyes that she wanted to be extubated. CN: Pupils equal at 4 mm. She made eye contact but would not track. Unable to check EOMs.. Face symmetric. Motor/Sensory: Spontaneously lifted both legs completely off the bed several times. DTRs - present and equal bilaterally. Possible Babinski reflex right lower extremity.  Heart - regular rate and rhythm. Distant heart sounds. No murmur noted. Lungs - fairly clear anteriorly the poor inspiratory effort  Lab Results: Results for orders placed or performed during the hospital encounter of 02/22/17 (from the past 48 hour(s))  Glucose, capillary     Status: None   Collection Time: 03/01/17  4:05 PM  Result Value Ref Range   Glucose-Capillary 86 65 - 99 mg/dL   Comment 1 Capillary Specimen    Comment 2 Notify RN   Glucose, capillary     Status: Abnormal   Collection Time: 03/01/17  8:28 PM  Result Value Ref Range   Glucose-Capillary 159 (H) 65 - 99 mg/dL   Comment 1 Notify RN   Glucose, capillary     Status: Abnormal   Collection Time:  03/02/17 12:32 AM  Result Value Ref Range   Glucose-Capillary 126 (H) 65 - 99 mg/dL   Comment 1 Notify RN   Magnesium     Status: None   Collection Time: 03/02/17  3:30 AM  Result Value Ref Range   Magnesium 2.4 1.7 - 2.4 mg/dL  CBC with Differential/Platelet     Status: Abnormal   Collection Time: 03/02/17  3:30 AM  Result Value Ref Range   WBC 7.0 4.0 - 10.5 K/uL   RBC 2.81 (L) 3.87 - 5.11 MIL/uL   Hemoglobin  9.4 (L) 12.0 - 15.0 g/dL   HCT 29.2 (L) 36.0 - 46.0 %   MCV 103.9 (H) 78.0 - 100.0 fL   MCH 33.5 26.0 - 34.0 pg   MCHC 32.2 30.0 - 36.0 g/dL   RDW 14.9 11.5 - 15.5 %   Platelets 198 150 - 400 K/uL   Neutrophils Relative % 66 %   Neutro Abs 4.6 1.7 - 7.7 K/uL   Lymphocytes Relative 26 %   Lymphs Abs 1.8 0.7 - 4.0 K/uL   Monocytes Relative 7 %   Monocytes Absolute 0.5 0.1 - 1.0 K/uL   Eosinophils Relative 1 %   Eosinophils Absolute 0.1 0.0 - 0.7 K/uL   Basophils Relative 0 %   Basophils Absolute 0.0 0.0 - 0.1 K/uL  Phosphorus     Status: None   Collection Time: 03/02/17  3:30 AM  Result Value Ref Range   Phosphorus 2.7 2.5 - 4.6 mg/dL  Vancomycin, trough     Status: None   Collection Time: 03/02/17  3:31 AM  Result Value Ref Range   Vancomycin Tr 18 15 - 20 ug/mL  Glucose, capillary     Status: None   Collection Time: 03/02/17  4:10 AM  Result Value Ref Range   Glucose-Capillary 89 65 - 99 mg/dL   Comment 1 Notify RN   Glucose, capillary     Status: Abnormal   Collection Time: 03/02/17  7:12 AM  Result Value Ref Range   Glucose-Capillary 128 (H) 65 - 99 mg/dL   Comment 1 Capillary Specimen    Comment 2 Notify RN   Culture, respiratory (NON-Expectorated)     Status: None (Preliminary result)   Collection Time: 03/02/17  8:49 AM  Result Value Ref Range   Specimen Description TRACHEAL ASPIRATE    Special Requests Normal    Gram Stain      RARE WBC PRESENT, PREDOMINANTLY MONONUCLEAR RARE SQUAMOUS EPITHELIAL CELLS PRESENT RARE GRAM POSITIVE COCCI IN CHAINS    Culture NO GROWTH < 24 HOURS    Report Status PENDING   Renal function panel     Status: Abnormal   Collection Time: 03/02/17 10:28 AM  Result Value Ref Range   Sodium 140 135 - 145 mmol/L   Potassium 3.5 3.5 - 5.1 mmol/L   Chloride 95 (L) 101 - 111 mmol/L   CO2 34 (H) 22 - 32 mmol/L   Glucose, Bld 172 (H) 65 - 99 mg/dL   BUN 24 (H) 6 - 20 mg/dL   Creatinine, Ser 0.70 0.44 - 1.00 mg/dL   Calcium 8.0 (L) 8.9 -  10.3 mg/dL   Phosphorus 2.3 (L) 2.5 - 4.6 mg/dL   Albumin 3.0 (L) 3.5 - 5.0 g/dL   GFR calc non Af Amer >60 >60 mL/min   GFR calc Af Amer >60 >60 mL/min    Comment: (NOTE) The eGFR has been calculated using the CKD EPI equation. This calculation has not  been validated in all clinical situations. eGFR's persistently <60 mL/min signify possible Chronic Kidney Disease.    Anion gap 11 5 - 15  Culture, blood (routine x 2)     Status: None (Preliminary result)   Collection Time: 03/02/17 10:28 AM  Result Value Ref Range   Specimen Description BLOOD RIGHT HAND    Special Requests      BOTTLES DRAWN AEROBIC ONLY Blood Culture adequate volume   Culture NO GROWTH 1 DAY    Report Status PENDING   Culture, blood (routine x 2)     Status: None (Preliminary result)   Collection Time: 03/02/17 10:31 AM  Result Value Ref Range   Specimen Description BLOOD RIGHT HAND    Special Requests      BOTTLES DRAWN AEROBIC ONLY Blood Culture adequate volume   Culture NO GROWTH 1 DAY    Report Status PENDING   Urinalysis, Routine w reflex microscopic     Status: Abnormal   Collection Time: 03/02/17 10:45 AM  Result Value Ref Range   Color, Urine COLORLESS (A) YELLOW   APPearance CLEAR CLEAR   Specific Gravity, Urine 1.006 1.005 - 1.030   pH 7.0 5.0 - 8.0   Glucose, UA NEGATIVE NEGATIVE mg/dL   Hgb urine dipstick NEGATIVE NEGATIVE   Bilirubin Urine NEGATIVE NEGATIVE   Ketones, ur NEGATIVE NEGATIVE mg/dL   Protein, ur NEGATIVE NEGATIVE mg/dL   Nitrite NEGATIVE NEGATIVE   Leukocytes, UA NEGATIVE NEGATIVE  Culture, Urine     Status: None   Collection Time: 03/02/17 10:46 AM  Result Value Ref Range   Specimen Description URINE, CATHETERIZED    Special Requests Normal    Culture NO GROWTH    Report Status 03/03/2017 FINAL   Glucose, capillary     Status: Abnormal   Collection Time: 03/02/17 11:26 AM  Result Value Ref Range   Glucose-Capillary 125 (H) 65 - 99 mg/dL   Comment 1 Capillary Specimen     Comment 2 Notify RN   Glucose, capillary     Status: Abnormal   Collection Time: 03/02/17  2:45 PM  Result Value Ref Range   Glucose-Capillary 109 (H) 65 - 99 mg/dL   Comment 1 Capillary Specimen    Comment 2 Notify RN   Glucose, capillary     Status: Abnormal   Collection Time: 03/02/17  8:45 PM  Result Value Ref Range   Glucose-Capillary 115 (H) 65 - 99 mg/dL   Comment 1 Notify RN   Magnesium     Status: None   Collection Time: 03/03/17  4:15 AM  Result Value Ref Range   Magnesium 2.2 1.7 - 2.4 mg/dL  CBC with Differential/Platelet     Status: Abnormal   Collection Time: 03/03/17  4:15 AM  Result Value Ref Range   WBC 9.3 4.0 - 10.5 K/uL   RBC 3.17 (L) 3.87 - 5.11 MIL/uL   Hemoglobin 10.0 (L) 12.0 - 15.0 g/dL   HCT 31.6 (L) 36.0 - 46.0 %   MCV 99.7 78.0 - 100.0 fL   MCH 31.5 26.0 - 34.0 pg   MCHC 31.6 30.0 - 36.0 g/dL   RDW 14.1 11.5 - 15.5 %   Platelets 206 150 - 400 K/uL   Neutrophils Relative % 73 %   Neutro Abs 6.8 1.7 - 7.7 K/uL   Lymphocytes Relative 17 %   Lymphs Abs 1.5 0.7 - 4.0 K/uL   Monocytes Relative 9 %   Monocytes Absolute 0.8 0.1 - 1.0 K/uL  Eosinophils Relative 1 %   Eosinophils Absolute 0.1 0.0 - 0.7 K/uL   Basophils Relative 0 %   Basophils Absolute 0.0 0.0 - 0.1 K/uL  Renal function panel     Status: Abnormal   Collection Time: 03/03/17  4:15 AM  Result Value Ref Range   Sodium 140 135 - 145 mmol/L   Potassium 3.1 (L) 3.5 - 5.1 mmol/L   Chloride 90 (L) 101 - 111 mmol/L   CO2 39 (H) 22 - 32 mmol/L   Glucose, Bld 113 (H) 65 - 99 mg/dL   BUN 21 (H) 6 - 20 mg/dL   Creatinine, Ser 0.70 0.44 - 1.00 mg/dL   Calcium 8.0 (L) 8.9 - 10.3 mg/dL   Phosphorus 2.6 2.5 - 4.6 mg/dL   Albumin 3.0 (L) 3.5 - 5.0 g/dL   GFR calc non Af Amer >60 >60 mL/min   GFR calc Af Amer >60 >60 mL/min    Comment: (NOTE) The eGFR has been calculated using the CKD EPI equation. This calculation has not been validated in all clinical situations. eGFR's persistently <60  mL/min signify possible Chronic Kidney Disease.    Anion gap 11 5 - 15  Glucose, capillary     Status: Abnormal   Collection Time: 03/03/17  4:29 AM  Result Value Ref Range   Glucose-Capillary 117 (H) 65 - 99 mg/dL   Comment 1 Capillary Specimen   Glucose, capillary     Status: Abnormal   Collection Time: 03/03/17 11:39 AM  Result Value Ref Range   Glucose-Capillary 116 (H) 65 - 99 mg/dL   Comment 1 Capillary Specimen    Comment 2 Notify RN     Recent Results (from the past 240 hour(s))  Blood Culture (routine x 2)     Status: None   Collection Time: 02/22/17 12:56 PM  Result Value Ref Range Status   Specimen Description BLOOD RIGHT PICC LINE  Final   Special Requests IN PEDIATRIC BOTTLE Blood Culture adequate volume  Final   Culture NO GROWTH 5 DAYS  Final   Report Status 02/27/2017 FINAL  Final  Culture, respiratory (NON-Expectorated)     Status: None   Collection Time: 02/22/17  5:31 PM  Result Value Ref Range Status   Specimen Description TRACHEAL ASPIRATE  Final   Special Requests NONE  Final   Gram Stain   Final    ABUNDANT WBC PRESENT,BOTH PMN AND MONONUCLEAR NO ORGANISMS SEEN    Culture RARE METHICILLIN RESISTANT STAPHYLOCOCCUS AUREUS  Final   Report Status 02/27/2017 FINAL  Final   Organism ID, Bacteria METHICILLIN RESISTANT STAPHYLOCOCCUS AUREUS  Final      Susceptibility   Methicillin resistant staphylococcus aureus - MIC*    CIPROFLOXACIN >=8 RESISTANT Resistant     ERYTHROMYCIN >=8 RESISTANT Resistant     GENTAMICIN <=0.5 SENSITIVE Sensitive     OXACILLIN >=4 RESISTANT Resistant     TETRACYCLINE <=1 SENSITIVE Sensitive     VANCOMYCIN <=0.5 SENSITIVE Sensitive     TRIMETH/SULFA <=10 SENSITIVE Sensitive     CLINDAMYCIN <=0.25 SENSITIVE Sensitive     RIFAMPIN <=0.5 SENSITIVE Sensitive     Inducible Clindamycin NEGATIVE Sensitive     * RARE METHICILLIN RESISTANT STAPHYLOCOCCUS AUREUS  Blood Culture (routine x 2)     Status: None   Collection Time: 02/22/17   8:40 PM  Result Value Ref Range Status   Specimen Description BLOOD RIGHT HAND  Final   Special Requests IN PEDIATRIC BOTTLE Blood Culture adequate volume  Final  Culture NO GROWTH 5 DAYS  Final   Report Status 02/28/2017 FINAL  Final  MRSA PCR Screening     Status: Abnormal   Collection Time: 02/22/17  9:09 PM  Result Value Ref Range Status   MRSA by PCR POSITIVE (A) NEGATIVE Final    Comment:        The GeneXpert MRSA Assay (FDA approved for NASAL specimens only), is one component of a comprehensive MRSA colonization surveillance program. It is not intended to diagnose MRSA infection nor to guide or monitor treatment for MRSA infections. RESULT CALLED TO, READ BACK BY AND VERIFIED WITH: RN C WOFFORD [id number]   Culture, respiratory (NON-Expectorated)     Status: None (Preliminary result)   Collection Time: 03/02/17  8:49 AM  Result Value Ref Range Status   Specimen Description TRACHEAL ASPIRATE  Final   Special Requests Normal  Final   Gram Stain   Final    RARE WBC PRESENT, PREDOMINANTLY MONONUCLEAR RARE SQUAMOUS EPITHELIAL CELLS PRESENT RARE GRAM POSITIVE COCCI IN CHAINS    Culture NO GROWTH < 24 HOURS  Final   Report Status PENDING  Incomplete  Culture, blood (routine x 2)     Status: None (Preliminary result)   Collection Time: 03/02/17 10:28 AM  Result Value Ref Range Status   Specimen Description BLOOD RIGHT HAND  Final   Special Requests   Final    BOTTLES DRAWN AEROBIC ONLY Blood Culture adequate volume   Culture NO GROWTH 1 DAY  Final   Report Status PENDING  Incomplete  Culture, blood (routine x 2)     Status: None (Preliminary result)   Collection Time: 03/02/17 10:31 AM  Result Value Ref Range Status   Specimen Description BLOOD RIGHT HAND  Final   Special Requests   Final    BOTTLES DRAWN AEROBIC ONLY Blood Culture adequate volume   Culture NO GROWTH 1 DAY  Final   Report Status PENDING  Incomplete  Culture, Urine     Status: None   Collection  Time: 03/02/17 10:46 AM  Result Value Ref Range Status   Specimen Description URINE, CATHETERIZED  Final   Special Requests Normal  Final   Culture NO GROWTH  Final   Report Status 03/03/2017 FINAL  Final    Lipid Panel No results for input(s): CHOL, TRIG, HDL, CHOLHDL, VLDL, LDLCALC in the last 72 hours.  Studies/Results:  Dg Chest Port 1 View 03/02/2017 IMPRESSION:  Tube and catheter positions as described without pneumothorax. Areas of patchy atelectasis bilaterally with consolidation, likely pneumonia, left base. There is increase in atelectasis in the right base compared to 1 day prior. Left lung appears stable. Stable cardiac silhouette.      MRI Brain WO Contrast 02/26/2017 IMPRESSION: 1. Stable and without explanation for symptoms. 2. Mild periventricular white matter disease. 3. Sinusitis with ethmoid and sphenoid fluid levels, also seen on prior. Bilateral partial mastoid opacification.   Cervical MRI 02/26/2017 1. Prominent edema in the bilateral neck with retropharyngeal collection. The submandibular glands appears thickened and edematous and this may be submandibular sialadenitis (bilaterality suggesting viral/systemic process). Suppurative infection is not excluded. 2. No acute finding to explain left arm deficit. 3. Disc and facet degeneration as described. Spinal stenosis with mild cord flattening at C5-6. Foraminal narrowings noted above.    Medications:  Prior to Admission:  Medications Prior to Admission  Medication Sig Dispense Refill Last Dose  . ADVAIR DISKUS 250-50 MCG/DOSE AEPB Take 1 puff by mouth daily.  LF 01-12-17 at 30DS  . baclofen (LIORESAL) 10 MG tablet Take 10 mg by mouth 3 (three) times daily as needed for muscle spasms.   LF 01-12-17 at 90DS  . gabapentin (NEURONTIN) 800 MG tablet Take 800 mg 3 (three) times daily by mouth.    LF 01-30-17 at 30DS  . LORazepam (ATIVAN) 1 MG tablet Take 1 tablet (1 mg total) by mouth 2 (two) times daily as  needed for anxiety. 10 tablet 0 LF 01-12-17 at 30DS  . PROAIR HFA 108 (90 Base) MCG/ACT inhaler Inhale 1-2 puffs every 4 (four) hours as needed into the lungs for shortness of breath.   LF 01-31-17 at 90DS  . temazepam (RESTORIL) 30 MG capsule Take 30 mg by mouth at bedtime as needed for sleep.   LF 01-12-17 at 30DS  . atorvastatin (LIPITOR) 20 MG tablet Take 20 mg by mouth at bedtime. Take one tablet daily for hyperlipidemia     at [facility]  . benztropine (COGENTIN) 2 MG tablet Take 1 tablet (2 mg total) by mouth 3 (three) times daily. 90 tablet 0  at [facility]  . ondansetron (ZOFRAN) 8 MG tablet Take 1 tablet (8 mg total) by mouth every 8 (eight) hours as needed for nausea or vomiting. 12 tablet 0  at [facility]  . venlafaxine XR (EFFEXOR-XR) 150 MG 24 hr capsule Take 300 mg by mouth daily with breakfast.   LF 12-07-16 at 30DS  . [DISCONTINUED] ARIPiprazole (ABILIFY) 20 MG tablet Take 1 tablet (20 mg total) by mouth daily. (Patient not taking: Reported on 02/22/2017) 15 tablet 0 LF 12-07-16 at 30DS   Scheduled: . ARIPiprazole  20 mg Per Tube Daily  . aspirin  81 mg Per Tube Daily  . atorvastatin  20 mg Per Tube QHS  . chlorhexidine gluconate (MEDLINE KIT)  15 mL Mouth Rinse BID  . Chlorhexidine Gluconate Cloth  6 each Topical Daily  . docusate  100 mg Per Tube BID  . folic acid  1 mg Per Tube Daily  . furosemide  40 mg Intravenous Q6H  . heparin  5,000 Units Subcutaneous Q8H  . hydrocortisone sod succinate (SOLU-CORTEF) inj  20 mg Intravenous Daily  . hydrocortisone sod succinate (SOLU-CORTEF) inj  30 mg Intravenous QHS  . ipratropium-albuterol  3 mL Nebulization Q6H  . levothyroxine  50 mcg Intravenous Daily  . mouth rinse  15 mL Mouth Rinse QID  . pantoprazole sodium  40 mg Per Tube Q24H  . sennosides  5 mL Per Tube BID  . sodium chloride flush  10-40 mL Intracatheter Q12H  . thiamine  100 mg Per Tube Daily    Assessment:  66 year old female found down with episode of posturing versus seizure after she  called EMS. Acute hypoxic respiratory failure requiring intubation. Initial work up revealed acute renal failure secondary to ATN, adrenal insufficiency and prominent edema in the bilateral neck with retropharyngeal collection and possible bilateral submandibular sialedenitis on imaging suggestive of a possible viral/systemic process. 1. Encephalopathy. Improving EEG patterns from 11/15-11/16 parallel her clinical improvement on prior documented neurological exams: EEG 11/15: Partial burst-suppression pattern without electrographic seizures. EEG 11/16: Loss of normal background with partial burst-suppression pattern, indicative of an improving global cerebral disturbance. No electrographic seizures. Some sharp activity.EEG 11/23: Mild generalized slowing of brain activity. No electrographic seizures.  2. Overdose on neuroleptics also could have given the clinical picture on initial presentation, but home medication aripiprazole listed as not being taken in Epic medications list.  3. MRI brain  obtained to evaluate decreased movement of LUE, which was improving, revealed no acute abnormality. Mild periventricular white matter disease was noted. MRI cervical spine revealed disc and facet degeneration, spinal stenosis with mild cord flattening at C5-6 and foraminal narrowings, but no acute findings to explain left arm deficit. 4. Elevated TSH. Known history of untreated hypothyroidism. Continuing IV Synthroid. Syndrome not consistent with myxedema coma per CCM.  5. Acute respiratory failure with hypoxia and compromised airway. Also with aspiration pneumonia. Currently intubated. CXR today (Friday 03/03/17) likely pneumonia, left base. On Vancomycin started 11/16. Temp 99.4 orally. WBCs 9.3   Recommendations: 1. Continue on current  Keppra 1,000 mg Q 12 hrs and valproic acid 500 mg Q 8 hrs. 2. Continue Synthroid 50 mcg daily. 3. Agree with continuing to hold outpatient Neurontin, baclofen, Ativan, Restoril,  Cogentin and Effexor 4. CIWA protocol 5. Attempt to wean off Precedex.  6. CCM is attempting to wean off ventilator, but retropharyngeal edema is concerning, which may require ENT assistance.   LOS: 9 days   Mikey Bussing PA-C [facility] Pager [phone] 03/03/2017, 3:43 PM  Electronically signed: Dr. Kerney Elbe

## 2017-03-03 NOTE — Progress Notes (Signed)
[facility] Progress Note Patient Name: Dominique Acosta DOB: November 22, 1950 MRN: [id number]   Date of Service  03/03/2017  HPI/Events of Note  Hypokalemia  eICU Interventions  Potassium replaced     Intervention Category Intermediate Interventions: Electrolyte abnormality - evaluation and management  Rain [place] 03/03/2017, 10:34 PM

## 2017-03-03 NOTE — Progress Notes (Signed)
EEG Completed; Results Pending  

## 2017-03-04 LAB — GLUCOSE, CAPILLARY
GLUCOSE-CAPILLARY: 107 mg/dL — AB (ref 65–99)
GLUCOSE-CAPILLARY: 152 mg/dL — AB (ref 65–99)
GLUCOSE-CAPILLARY: 101 mg/dL — AB (ref 65–99)
Glucose-Capillary: 95 mg/dL (ref 65–99)
Glucose-Capillary: 127 mg/dL — ABNORMAL HIGH (ref 65–99)
Glucose-Capillary: 120 mg/dL — ABNORMAL HIGH (ref 65–99)
Glucose-Capillary: 124 mg/dL — ABNORMAL HIGH (ref 65–99)

## 2017-03-04 LAB — CBC WITH DIFFERENTIAL/PLATELET
BASOS ABS: 0 10*3/uL (ref 0.0–0.1)
Basophils Relative: 0 %
EOS PCT: 1 %
Eosinophils Absolute: 0.1 10*3/uL (ref 0.0–0.7)
HCT: 28 % — ABNORMAL LOW (ref 36.0–46.0)
HEMOGLOBIN: 9.2 g/dL — AB (ref 12.0–15.0)
LYMPHS PCT: 22 %
Lymphs Abs: 2.1 10*3/uL (ref 0.7–4.0)
MCH: 31.8 pg (ref 26.0–34.0)
MCHC: 32.9 g/dL (ref 30.0–36.0)
MCV: 96.9 fL (ref 78.0–100.0)
Monocytes Absolute: 0.9 10*3/uL (ref 0.1–1.0)
Monocytes Relative: 9 %
NEUTROS ABS: 6.7 10*3/uL (ref 1.7–7.7)
NEUTROS PCT: 68 %
PLATELETS: 203 10*3/uL (ref 150–400)
RBC: 2.89 MIL/uL — AB (ref 3.87–5.11)
RDW: 13.8 % (ref 11.5–15.5)
WBC: 9.8 10*3/uL (ref 4.0–10.5)

## 2017-03-04 LAB — CULTURE, RESPIRATORY W GRAM STAIN: Special Requests: NORMAL

## 2017-03-04 LAB — RENAL FUNCTION PANEL
ALBUMIN: 2.8 g/dL — AB (ref 3.5–5.0)
Anion gap: 10 (ref 5–15)
BUN: 23 mg/dL — AB (ref 6–20)
CALCIUM: 8 mg/dL — AB (ref 8.9–10.3)
CHLORIDE: 88 mmol/L — AB (ref 101–111)
CO2: 37 mmol/L — ABNORMAL HIGH (ref 22–32)
CREATININE: 0.67 mg/dL (ref 0.44–1.00)
Glucose, Bld: 129 mg/dL — ABNORMAL HIGH (ref 65–99)
Phosphorus: 2.3 mg/dL — ABNORMAL LOW (ref 2.5–4.6)
Potassium: 3.5 mmol/L (ref 3.5–5.1)
SODIUM: 135 mmol/L (ref 135–145)

## 2017-03-04 LAB — HEMOGLOBIN AND HEMATOCRIT, BLOOD
HCT: 31.3 % — ABNORMAL LOW (ref 36.0–46.0)
HEMOGLOBIN: 10.4 g/dL — AB (ref 12.0–15.0)

## 2017-03-04 LAB — MAGNESIUM: MAGNESIUM: 2.1 mg/dL (ref 1.7–2.4)

## 2017-03-04 LAB — CULTURE, RESPIRATORY: CULTURE: NORMAL

## 2017-03-04 MED ORDER — DEXAMETHASONE SODIUM PHOSPHATE 4 MG/ML IJ SOLN
4.0000 mg | Freq: Four times a day (QID) | INTRAMUSCULAR | Status: DC
  Filled 2017-03-04: qty 1

## 2017-03-04 MED ORDER — DEXAMETHASONE SODIUM PHOSPHATE 4 MG/ML IJ SOLN
4.0000 mg | Freq: Four times a day (QID) | INTRAMUSCULAR | Status: AC
  Administered 2017-03-04 – 2017-03-05 (×4): 4 mg via INTRAVENOUS
  Filled 2017-03-04 (×4): qty 1

## 2017-03-04 MED ORDER — RACEPINEPHRINE HCL 2.25 % IN NEBU
0.5000 mL | INHALATION_SOLUTION | Freq: Once | RESPIRATORY_TRACT | Status: AC
  Administered 2017-03-04: 0.5 mL via RESPIRATORY_TRACT

## 2017-03-04 MED ORDER — FENTANYL CITRATE (PF) 100 MCG/2ML IJ SOLN
INTRAMUSCULAR | Status: AC
  Filled 2017-03-04: qty 2

## 2017-03-04 MED ORDER — FUROSEMIDE 10 MG/ML IJ SOLN
INTRAMUSCULAR | Status: AC
  Administered 2017-03-04: 40 mg via INTRAVENOUS
  Filled 2017-03-04: qty 4

## 2017-03-04 MED ORDER — RACEPINEPHRINE HCL 2.25 % IN NEBU
INHALATION_SOLUTION | RESPIRATORY_TRACT | Status: AC
  Administered 2017-03-04: 0.5 mL via RESPIRATORY_TRACT
  Filled 2017-03-04: qty 0.5

## 2017-03-04 MED ORDER — MIDAZOLAM HCL 2 MG/2ML IJ SOLN
INTRAMUSCULAR | Status: AC
  Filled 2017-03-04: qty 2

## 2017-03-04 MED ORDER — FUROSEMIDE 10 MG/ML IJ SOLN
40.0000 mg | Freq: Once | INTRAMUSCULAR | Status: AC
  Administered 2017-03-04: 40 mg via INTRAVENOUS

## 2017-03-04 MED ORDER — BACLOFEN 1 MG/ML ORAL SUSPENSION
5.0000 mg | Freq: Two times a day (BID) | ORAL | Status: DC
  Administered 2017-03-04: 10:00:00 5 mg
  Filled 2017-03-04 (×5): qty 0.5

## 2017-03-04 NOTE — Progress Notes (Signed)
[facility] follow-up note  Patient extubated self at 1300 hrs. 03/04/2017 Around 1445 she developed stridor or stertor distress. She was given Decadron 4 mg IV, racemic epinephrine treatment, Lasix 40 mg IV and placed on noninvasive mechanical ventilatory support.  The dilemma is that she is a DNR but she self extubated.  Try to support her with noninvasive procedures.  She has a known difficult airway due to retropharyngeal fluid collection with suspected infectious process. BP (!) 150/86   Pulse 86   Temp 98.1 F (36.7 C) (Oral)   Resp (!) 21   Ht 5\' 1"  (1.549 m)   Wt 181 lb 14.1 oz (82.5 kg)   SpO2 100%   BMI 34.37 kg/m   Intake/Output Summary (Last 24 hours) at 03/04/2017 1513 Last data filed at 03/04/2017 1500 Gross per 24 hour  Intake 2538.13 ml  Output 2775 ml  Net -236.87 ml   Recent Labs  Lab 03/03/17 0415 03/03/17 2142 03/04/17 0615  NA 140 135 135  K 3.1* 3.1* 3.5  CL 90* 87* 88*  CO2 39* 38* 37*  BUN 21* 20 23*  CREATININE 0.70 0.67 0.67  GLUCOSE 113* 133* 129*   Recent Labs  Lab 03/02/17 0330 03/03/17 0415 03/04/17 0615  HGB 9.4* 10.0* 9.2*  HCT 29.2* 31.6* 28.0*  WBC 7.0 9.3 9.8  PLT 198 206 203   No results found. Impression plan  Off extubation 03/04/2017 at 1300 hrs. no development stridor at 1445 hrs. Decadron 4 mg IV times 4 days Lasix 40 mg IV x1 Make epinephrine treatment x1 Noninvasive mechanical ventilatory support to avoid reintubation.   Critical care time 45 minutes   Richardson Landry Minor ACNP Maryanna Shape PCCM Pager [phone] till 1 pm If no answer page [phone] 03/04/2017, 3:14 PM

## 2017-03-04 NOTE — Plan of Care (Signed)
  Progressing Education: Knowledge of General Education information will improve 03/04/2017 0101 - Progressing by Randal Buba, RN Health Behavior/Discharge Planning: Ability to manage health-related needs will improve 03/04/2017 0101 - Progressing by Randal Buba, RN Clinical Measurements: Ability to maintain clinical measurements within normal limits will improve 03/04/2017 0101 - Progressing by Randal Buba, RN Will remain free from infection 03/04/2017 0101 - Progressing by Randal Buba, RN Diagnostic test results will improve 03/04/2017 0101 - Progressing by Randal Buba, RN Respiratory complications will improve 03/04/2017 0101 - Progressing by Randal Buba, RN Cardiovascular complication will be avoided 03/04/2017 0101 - Progressing by Randal Buba, RN Activity: Risk for activity intolerance will decrease 03/04/2017 0101 - Progressing by Randal Buba, RN Coping: Level of anxiety will decrease 03/04/2017 0101 - Progressing by Randal Buba, RN Elimination: Will not experience complications related to bowel motility 03/04/2017 0101 - Progressing by Randal Buba, RN Will not experience complications related to urinary retention 03/04/2017 0101 - Progressing by Randal Buba, RN Safety: Ability to remain free from injury will improve 03/04/2017 0101 - Progressing by Randal Buba, RN Skin Integrity: Risk for impaired skin integrity will decrease 03/04/2017 0101 - Progressing by Randal Buba, RN Activity: Ability to tolerate increased activity will improve 03/04/2017 0101 - Progressing by Randal Buba, RN Respiratory: Ability to maintain a clear airway and adequate ventilation will improve 03/04/2017 0101 - Progressing by Randal Buba, RN Role Relationship: Method of communication will improve 03/04/2017 0101 - Progressing by Randal Buba, RN

## 2017-03-04 NOTE — Consult Note (Signed)
Dominique Acosta  Referring Physician: Dr. Ashok Cordia Primary Care Physician: Lorene Dy, MD Patient Location at Initial Consult: Inpatient Service: ICU Chief Complaint/Reason for Consult: airway evaluation  History of Presenting Illness:  History obtained from Dr. Ashok Cordia and EMR. Patient intubated and sedated.  Dominique Acosta is a  66 y.o. female presenting with no cuff leak around her 7.5 ETT. She has been intubated for 10 days. Admitted for altered mental status and neuro deficit of unknown origin. MRI demonstrated a retropharyngeal fluid collection, as well as concern for sialadenitis. MICU team has noted improvement in mental alertness and is working on vent wean. Yesterday, she was maintained on minimal ventilator settings, but she continues to have no cuff leak. ENT was consulted for airway evaluation given no cuff leak and appearance of retropharyngeal fluid collection on MRI neck on 11/18. She is not currently on antibiotics. She has been agitated on the ventilator and requiring precedex and occasionally fentanyl, which causes some episodes of apnea. Patient has been on steroids for the past several days for adrenal insufficiency. MICU working on fluid diuresis as well.   Past Medical History:  Diagnosis Date  . Arm pain   . Chronic pain   . COPD (chronic obstructive pulmonary disease) ([place])   . Coronary artery disease   . Depression   . Dystonia   . Epilepsy ([place])   . High cholesterol   . Humerus fracture   . Hypothyroid   . Insomnia   . Leukemia ([place])   . Myocardial infarct ([place])   . Psychosis ([place])   . Schizophrenia ([place])   . Seizure [facility])     Past Surgical History:  Procedure Laterality Date  . EXTERNAL EAR SURGERY    . HUMERUS FRACTURE SURGERY    . SHOULDER SURGERY      Family History  Problem Relation Age of Onset  . Cancer Mother   . Cancer Father     Social History   Socioeconomic History  . Marital status:  Single    Spouse name: None  . Number of children: None  . Years of education: None  . Highest education level: None  Social Needs  . Financial resource strain: None  . Food insecurity - worry: None  . Food insecurity - inability: None  . Transportation needs - medical: None  . Transportation needs - non-medical: None  Occupational History  . None  Tobacco Use  . Smoking status: Former Smoker    Packs/day: 2.00    Years: 41.00    Pack years: 82.00    Types: Cigarettes  . Smokeless tobacco: Never Used  Substance and Sexual Activity  . Alcohol use: No  . Drug use: No  . Sexual activity: No  Other Topics Concern  . None  Social History Narrative   Admitted to [facility] 08/20/16   Single   Smokes daily   Alcohol none   No Advanced Directive.       No current facility-administered medications on file prior to encounter.    Current Outpatient Medications on File Prior to Encounter  Medication Sig Dispense Refill  . ADVAIR DISKUS 250-50 MCG/DOSE AEPB Take 1 puff by mouth daily.    . baclofen (LIORESAL) 10 MG tablet Take 10 mg by mouth 3 (three) times daily as needed for muscle spasms.    Marland Kitchen gabapentin (NEURONTIN) 800 MG tablet Take 800 mg 3 (three) times daily by mouth.     Marland Kitchen LORazepam (ATIVAN) 1  MG tablet Take 1 tablet (1 mg total) by mouth 2 (two) times daily as needed for anxiety. 10 tablet 0  . PROAIR HFA 108 (90 Base) MCG/ACT inhaler Inhale 1-2 puffs every 4 (four) hours as needed into the lungs for shortness of breath.    . temazepam (RESTORIL) 30 MG capsule Take 30 mg by mouth at bedtime as needed for sleep.    Marland Kitchen atorvastatin (LIPITOR) 20 MG tablet Take 20 mg by mouth at bedtime. Take one tablet daily for hyperlipidemia     . benztropine (COGENTIN) 2 MG tablet Take 1 tablet (2 mg total) by mouth 3 (three) times daily. 90 tablet 0  . ondansetron (ZOFRAN) 8 MG tablet Take 1 tablet (8 mg total) by mouth every 8 (eight) hours as needed for nausea or vomiting. 12  tablet 0  . venlafaxine XR (EFFEXOR-XR) 150 MG 24 hr capsule Take 300 mg by mouth daily with breakfast.      Allergies  Allergen Reactions  . Amoxicillin Anaphylaxis and Other (See Comments)    Childhood allergy >> tolerated cephalosporins  Has patient had a PCN reaction causing immediate rash, facial/tongue/throat swelling, SOB or lightheadedness with hypotension: Yes Has patient had a PCN reaction causing severe rash involving mucus membranes or skin necrosis: No Has patient had a PCN reaction that required hospitalization No Has patient had a PCN reaction occurring within the last 10 years: No If all of the above answers are "NO", then may proceed with Cephalosporin use.  Marland Kitchen Penicillins Anaphylaxis and Other (See Comments)    Childhood allergy Has patient had a PCN reaction causing immediate rash, facial/tongue/throat swelling, SOB or lightheadedness with hypotension: Yes Has patient had a PCN reaction causing severe rash involving mucus membranes or skin necrosis: No Has patient had a PCN reaction that required hospitalization No Has patient had a PCN reaction occurring within the last 10 years: No If all of the above answers are "NO", then may proceed with Cephalosporin use.   Marland Kitchen Phenytoin Other (See Comments)    Reaction:  CNS disorder   . Keflex [Cephalexin] Diarrhea  . Dilaudid [Hydromorphone Hcl] Other (See Comments)    Reaction:  Psychosis   . Haldol [Haloperidol] Other (See Comments)    Reaction:  Psychosis   . Morphine And Related Other (See Comments)    Reaction:  Psychosis      Review of Systems: Unable to obtain 2/2 patient being intubated and altered mental status   OBJECTIVE: Vital Signs: Vitals:   03/04/17 0900 03/04/17 1000  BP: (!) 103/59 122/73  Pulse: 68 70  Resp: 14 15  Temp:    SpO2: 97% 96%    I&O  Intake/Output Summary (Last 24 hours) at 03/04/2017 1023 Last data filed at 03/04/2017 1020 Gross per 24 hour  Intake 2977.46 ml  Output 4750 ml   Net -1772.54 ml    Physical Exam General: Well developed, well nourished. No acute distress.  Intubated. Does follow commands.  Arms in restraints. Patient agitated.   Head/Face: Normocephalic, atraumatic. No scars or lesions. No sinus tenderness. Facial nerve intact and equal bilaterally.  No facial lacerations. Salivary glands non tender and without palpable masses  Eyes: Globes well positioned, no proptosis Lids: No periorbital edema/ecchymosis. No lid laceration Conjunctiva: No chemosis, hemorrhage PERRL  Ears: No gross deformity. Normal external canal.    Hearing:  Normal speech reception.  Nose: No gross deformity or lesions. No purulent discharge. Septum midline. No turbinate hypertrophy.  Mouth/Oropharynx: Lips without any lesions.  Edentulous. No mucosal lesions within the oropharynx. No tonsillar enlargement, exudate, or lesions. Pharyngeal walls symmetrical. Uvula midline. Tongue midline without lesions. FOM is soft. Patient tender to palpation over right FOM. No palpable stones. Submandibular glands are soft.   Neck: Trachea midline. No masses. No thyromegaly or nodules palpated. No crepitus. Normal ROM neck. No overlying erythema.   Lymphatic: No lymphadenopathy in the neck. No tenderness  Respiratory: No stridor or distress.  Cardiovascular: Regular rate and rhythm.  Extremities: No edema or cyanosis. Warm and well-perfused.  Skin: No scars or lesions on face or neck.  Neurologic: CN II-XII intact.    Other:      Labs: Lab Results  Component Value Date   WBC 9.8 03/04/2017   HGB 9.2 (L) 03/04/2017   HCT 28.0 (L) 03/04/2017   PLT 203 03/04/2017   CHOL 162 09/02/2010   TRIG 164 (H) 02/28/2017   HDL 40 09/02/2010   ALT 20 02/24/2017   AST 23 02/24/2017   NA 135 03/04/2017   K 3.5 03/04/2017   CL 88 (L) 03/04/2017   CREATININE 0.67 03/04/2017   BUN 23 (H) 03/04/2017   CO2 37 (H) 03/04/2017   TSH 51.322 (H) 02/23/2017   INR 1.07 02/22/2017     Review of  Ancillary Data / Diagnostic Tests: WBC normal RT reports reviewed MRI 11/18 personally reviewed- minor retropharyngeal edema is difficult to appreciate  ASSESSMENT:  66 y.o. female with concern for glottic edema based on retropharyngeal edema on MRI neck.   RECOMMENDATIONS: -Continue vent wean as patient tolerates -Continue to assess for cuff leak -Please obtain CT neck to evaluate neck soft tissue and possible retropharyngeal collection.  -Agree with diuresis -Will consider trial extubation in the coming days over [facility] catheter, with flexible scope Glidescope at bedside.  -Will follow.     Gavin Pound, MD  [facility], [place] Office phone [phone]

## 2017-03-04 NOTE — Progress Notes (Signed)
Dominique Amber, RN responded to ventilator alarm while I was in the room with another patient. Patient was noted to have self extubated and holding OG tube in hand. Patient was able to speak. Nasal cannula applied. Dr Ashok Cordia and respiratory notified. Stridor ascultated. Continuing to monitor at this time with possibility of reintubation. Patient is alert, following commands, sats 93% on 4L [place].

## 2017-03-04 NOTE — Plan of Care (Signed)
  Progressing Clinical Measurements: Will remain free from infection 03/04/2017 2119 - Progressing by Ernestene Kiel, RN Diagnostic test results will improve 03/04/2017 2119 - Progressing by Ernestene Kiel, RN Respiratory complications will improve 03/04/2017 2119 - Progressing by Ernestene Kiel, RN Cardiovascular complication will be avoided 03/04/2017 2119 - Progressing by Ernestene Kiel, RN

## 2017-03-04 NOTE — Progress Notes (Signed)
[place] [facility] Attending Note  ADMISSION DATE:02/22/2017  REFERRING MD:Sam Winfred Leeds, M.D. / EDP  CHIEF COMPLAINT:Acute Encephalopathy  Presenting HPI:  66 y.o. female former smoker. Found unresponsive after she called EMS. Patient had agonal respirations and hyperthermia. Required endotracheal intubation for airway protection. Known history of COPD, coronary disease, depression, schizophrenia, dystonia, seizure disorder, substance abuse, chronic pain, and hypothyroidism. Patient has not been on treatment for her hypothyroidism.  Subjective:  No acute events overnight. Patient apneic this morning on spontaneous breathing trial. Still no cuff leak. Patient endorsing back pain. Denies any nausea or difficulty breathing.  Review of Systems:  Unable to obtain given intubation.   Vent Mode: PRVC FiO2 (%):  [40 %] 40 % Set Rate:  [14 bmp] 14 bmp Vt Set:  [420 mL] 420 mL PEEP:  [5 cmH20] 5 cmH20 Pressure Support:  [8 cmH20] 8 cmH20 Plateau Pressure:  [16 cmH20-25 cmH20] 16 cmH20  Temp:  [97.8 F (36.6 C)-99.7 F (37.6 C)] 97.8 F (36.6 C) (11/24 0400) Pulse Rate:  [61-91] 61 (11/24 0600) Resp:  [13-26] 14 (11/24 0600) BP: (60-165)/(32-138) 84/50 (11/24 0600) SpO2:  [86 %-100 %] 99 % (11/24 0738) FiO2 (%):  [40 %] 40 % (11/24 0738) Weight:  [181 lb 14.1 oz (82.5 kg)] 181 lb 14.1 oz (82.5 kg) (11/24 0301)  Gen.: Awake. No distress. Currently on fentanyl and Precedex infusions. Integument: Warm. Dry. Without rash. Neurological: Nods to questions. Attends to voice. Grossly nonfocal. Cardiovascular: Regular rhythm. No edema. No JVD. Pulmonary: Symmetric chest wall expansion on ventilator. Clear bilaterally with auscultation. Abdomen: Soft. Nondistended. Normal bowel sounds.  LINES/TUBES: OETT 11/14 >>> R IJ CVL 11/15 >>> Foley >>> OGT >>> PIV  CBC Latest Ref Rng & Units 03/04/2017 03/03/2017 03/02/2017  WBC 4.0 - 10.5 K/uL 9.8 9.3 7.0  Hemoglobin 12.0  - 15.0 g/dL 9.2(L) 10.0(L) 9.4(L)  Hematocrit 36.0 - 46.0 % 28.0(L) 31.6(L) 29.2(L)  Platelets 150 - 400 K/uL 203 206 198    BMP Latest Ref Rng & Units 03/04/2017 03/03/2017 03/03/2017  Glucose 65 - 99 mg/dL 129(H) 133(H) 113(H)  BUN 6 - 20 mg/dL 23(H) 20 21(H)  Creatinine 0.44 - 1.00 mg/dL 0.67 0.67 0.70  Sodium 135 - 145 mmol/L 135 135 140  Potassium 3.5 - 5.1 mmol/L 3.5 3.1(L) 3.1(L)  Chloride 101 - 111 mmol/L 88(L) 87(L) 90(L)  CO2 22 - 32 mmol/L 37(H) 38(H) 39(H)  Calcium 8.9 - 10.3 mg/dL 8.0(L) 7.9(L) 8.0(L)    Hepatic Function Latest Ref Rng & Units 03/04/2017 03/03/2017 03/02/2017  Total Protein 6.5 - 8.1 g/dL - - -  Albumin 3.5 - 5.0 g/dL 2.8(L) 3.0(L) 3.0(L)  AST 15 - 41 U/L - - -  ALT 14 - 54 U/L - - -  Alk Phosphatase 38 - 126 U/L - - -  Total Bilirubin 0.3 - 1.2 mg/dL - - -    IMAGING/STUDIES: UDS 11/14:  Negative  CONTINUOUS EEG 11/16:  This was amoderatelyabnormal continuous video EEG due to loss of normal background, diffuse sharply contoured slowing(resolved), and a partial burst-suppression pattern. This was indicative of an improvingglobal cerebral disturbance.No seizuresor definite epileptiform dischargeswere seen, however sharp activity was present early in the record. MRI BRAIN W/O 11/18: 1. Stable and without explanation for symptoms. 2. Mild periventricular white matter disease. 3. Sinusitis with ethmoid and sphenoid fluid levels, also seen on prior. Bilateral partial mastoid opacification. MRI C-SPINE W/O 11/18: 1. Prominent edema in the bilateral neck with retropharyngeal collection. The submandibular glands appears  thickened and edematous and this may be submandibular sialadenitis (bilaterality suggesting viral/systemic process). Suppurative infection is not excluded. 2. No acute finding to explain left arm deficit. 3. Disc and facet degeneration as described. Spinal stenosis with mild cord flattening at C5-6. Foraminal narrowings noted  above. PORT CXR 11/19:  Previously reviewed by me. Good positioning of endotracheal tube. Enteric feeding tube coursing below the diaphragm. Central venous catheter in superior vena cava. Questionable patchy opacity left lung base. No new focal opacity appreciated. PORT CXR 11/21:  Previously reviewed by me. Questionable hazy left midlung opacity. Silhouetting left hemidiaphragm. Endotracheal tube in the level of the carina. Low lung volumes. PORT CXR 11/22:  Previously reviewed by me. Minimal basilar opacity suggestive of atelectasis. Lordotic view. Endotracheal tube in good position. Right internal jugular central venous catheter in good position. Enteric feeding tube coursing below diaphragm. EEG 11/23:  This is an abnormal EEG.  There is mild generalized slowing of brain activity, which is non-specific, but may be due to toxic, metabolic, infectious, or hypoxic etiologies.  Clinical correlation is recommended.  The patient is not in non-convulsive status epilepticus.  MICROBIOLOGY: MRSA PCR :  Positive  Blood Cultures x2 11/14:  Negative  Tracheal Aspirate Culture 11/14:  MRSA Urine Streptococcal Antigen 11/14:  Negative  Urine Legionella Antigen 11/14:  Negative  Tracheal Aspirate Culture 11/22 >>> Urine Culture 11/22:  Negative  Blood Cultures x2 11/22 >>>  ANTIBIOTICS: Levaquin 11/14 - 11/15 Aztreonam 11/14 - 11/15 Cefepime 11/15 - 11/18 Vancomycin 11/14 - 11/23   SIGNIFICANT EVENTS: 11/14 - Admit 11/15 - Continuous EEG 11/20 - Improving mentation on Precedex. No cuff leak. Tolerating PS 5/5.  11/22 - Patient apneic on sedation w/ SBT. FUO >> cultures repeated. 11/23 - No cuff leak. Following commands and much more awake & cooperative. 11/24 - Still no cuff leak >> consulted ENT  ASSESSMENT/PLAN:  66 y.o. female with history of psychiatric illness including polysubstance abuse and schizophrenia. Previously untreated hypothyroidism. Mental status markedly improved when off  narcotic infusion. Still has no cuff leak.   1. Acute hypoxic respiratory failure: Secondary to compromised airway. Continuing daily spontaneous breathing trial. Status post treatment of pneumonia. Likely will need extubation in the OR.  2. Retropharyngeal edema: No cuff leak despite diuresis and steroids. Suggested on MRI. Consulting ENT for evaluation and possible extubation at bedside with their support more in the OR. 3. Acute encephalopathy: Resolved. Continuing to limit sedation. Continuing thiamine daily. Continuing Precedex infusion. 4. FUO: Repeat cultures pending. Finished course of treatment for MRSA pneumonia on 11/23. Holding on further antibiotic cultures.  5. Hypothyroidism: Continuing IV Synthroid. Syndrome not consistent with myxedema coma. 6. MRSA pneumonia: Currently on day #10/10 of vancomycin. 7. Adrenal insufficiency: Continuing Solu-Cortef 20 mg IV every morning & 30 mg IV daily at bedtime. 8. History of schizophrenia & depression: Continuing Abilify. Holding Ativan, Effexor, Cogentin, and Restoril. 9. Epilepsy: Continuing Keppra & Depakote. Neurology following. Holding outpatient Neurontin. 10. Chronic pain: Continuing fentanyl IV as needed. Restarting Baclofen at '5mg'$  via tube BID. Holding outpatient Neurontin. 11. Polysubstance abuse: Continuing thiamine and folic acid via tube daily. Continuing Precedex infusion. 12. COPD: No signs of acute exacerbation. Continuing Duonebs every 6 hours.  Prophylaxis:  Heparin [place] q8hr, SCDs, & Protonix via tube qhs. Diet:  NPO. Tube Feedings per dietary recommendations. Code Status:  Full Code. Disposition:  Remains in ICU while on ventilator. Family Update:   sister contacted by phone & updated on 11/23.  I have personally  spent a total of 32 minutes of critical care time today caring for the patient & reviewing the patient's electronic medical record.  Sonia Baller Ashok Cordia, M.D. [facility] Pulmonary & Critical Care Pager:   [phone] After 7pm or if no response, call [phone] 7:50 AM 03/04/17

## 2017-03-04 NOTE — Progress Notes (Signed)
PCCM Attending:  Patient self extubated after rounds this morning. Developed some stridor 1 hour or so after extubation. Contacted patient's sister via phone clarifying CODE STATUS. She does indicate that we should reintubate if deemed medically necessary. Patient will remain DO NOT RESUSCITATE. Continuing treatment with Decadron, Lasix, and racemic epinephrine nebulized. Continuing limited noninvasive positive pressure ventilation for now. Close monitoring for possible reintubation.  Sonia Baller Ashok Cordia, M.D. [facility] Pulmonary & Critical Care Pager:  [phone] After 7pm or if no response, call [phone] 4:14 PM 03/04/17

## 2017-03-04 NOTE — Procedures (Signed)
Extubation Procedure Note  Patient Details:   Name: Dominique Acosta DOB: 1950/04/26 MRN: [id number]  Pt self extubated at this time.  Pt placed on 3L [place], tolerating well.  No distress noted at this time, pt able to voice.  MD aware.   Ciro Backer 03/04/2017, 1:00 PM

## 2017-03-04 NOTE — Progress Notes (Signed)
PCCM Attending Re-Rounding Note:  Patient evaluated off BiPAP. Normal work of breathing. Voice still slightly raspy. No stridor on auscultation. Good air movement. Normal saturation. Hemodynamically stable. Good urine output. Continuing close monitoring. Continuing Decadron. Deferring CT of the neck for now.  Sonia Baller Ashok Cordia, M.D. [facility] Pulmonary & Critical Care Pager:  [phone] After 7pm or if no response, call [phone] 6:43 PM 03/04/17

## 2017-03-05 LAB — CBC WITH DIFFERENTIAL/PLATELET
BASOS PCT: 0 %
Basophils Absolute: 0 10*3/uL (ref 0.0–0.1)
EOS ABS: 0 10*3/uL (ref 0.0–0.7)
EOS PCT: 0 %
HCT: 26.1 % — ABNORMAL LOW (ref 36.0–46.0)
HEMOGLOBIN: 9.1 g/dL — AB (ref 12.0–15.0)
LYMPHS ABS: 1.5 10*3/uL (ref 0.7–4.0)
Lymphocytes Relative: 15 %
MCH: 34.3 pg — AB (ref 26.0–34.0)
MCHC: 34.9 g/dL (ref 30.0–36.0)
MCV: 98.5 fL (ref 78.0–100.0)
MONOS PCT: 8 %
Monocytes Absolute: 0.8 10*3/uL (ref 0.1–1.0)
NEUTROS PCT: 77 %
Neutro Abs: 7.7 10*3/uL (ref 1.7–7.7)
PLATELETS: 246 10*3/uL (ref 150–400)
RBC: 2.65 MIL/uL — ABNORMAL LOW (ref 3.87–5.11)
RDW: 14.1 % (ref 11.5–15.5)
WBC: 10.1 10*3/uL (ref 4.0–10.5)

## 2017-03-05 LAB — GLUCOSE, CAPILLARY
GLUCOSE-CAPILLARY: 104 mg/dL — AB (ref 65–99)
GLUCOSE-CAPILLARY: 113 mg/dL — AB (ref 65–99)
GLUCOSE-CAPILLARY: 122 mg/dL — AB (ref 65–99)
GLUCOSE-CAPILLARY: 95 mg/dL (ref 65–99)
GLUCOSE-CAPILLARY: 99 mg/dL (ref 65–99)
Glucose-Capillary: 120 mg/dL — ABNORMAL HIGH (ref 65–99)

## 2017-03-05 LAB — HEMOGLOBIN AND HEMATOCRIT, BLOOD
HCT: 26.4 % — ABNORMAL LOW (ref 36.0–46.0)
Hemoglobin: 8.8 g/dL — ABNORMAL LOW (ref 12.0–15.0)

## 2017-03-05 LAB — RENAL FUNCTION PANEL
Albumin: 3.3 g/dL — ABNORMAL LOW (ref 3.5–5.0)
Anion gap: 11 (ref 5–15)
BUN: 20 mg/dL (ref 6–20)
CO2: 34 mmol/L — ABNORMAL HIGH (ref 22–32)
Calcium: 8.2 mg/dL — ABNORMAL LOW (ref 8.9–10.3)
Chloride: 90 mmol/L — ABNORMAL LOW (ref 101–111)
Creatinine, Ser: 0.74 mg/dL (ref 0.44–1.00)
GFR calc Af Amer: >60 mL/min
GFR calc non Af Amer: >60 mL/min
Glucose, Bld: 118 mg/dL — ABNORMAL HIGH (ref 65–99)
Phosphorus: 2.7 mg/dL (ref 2.5–4.6)
Potassium: 3.1 mmol/L — ABNORMAL LOW (ref 3.5–5.1)
Sodium: 135 mmol/L (ref 135–145)

## 2017-03-05 LAB — BASIC METABOLIC PANEL
ANION GAP: 10 (ref 5–15)
BUN: 19 mg/dL (ref 6–20)
CHLORIDE: 92 mmol/L — AB (ref 101–111)
CO2: 33 mmol/L — ABNORMAL HIGH (ref 22–32)
Calcium: 8.2 mg/dL — ABNORMAL LOW (ref 8.9–10.3)
Creatinine, Ser: 0.7 mg/dL (ref 0.44–1.00)
Glucose, Bld: 109 mg/dL — ABNORMAL HIGH (ref 65–99)
POTASSIUM: 3.3 mmol/L — AB (ref 3.5–5.1)
SODIUM: 135 mmol/L (ref 135–145)

## 2017-03-05 LAB — MAGNESIUM
MAGNESIUM: 2.3 mg/dL (ref 1.7–2.4)
Magnesium: 2.1 mg/dL (ref 1.7–2.4)

## 2017-03-05 MED ORDER — THIAMINE HCL 100 MG/ML IJ SOLN
100.0000 mg | Freq: Every day | INTRAMUSCULAR | Status: DC
  Administered 2017-03-05 – 2017-03-11 (×7): 100 mg via INTRAVENOUS
  Filled 2017-03-05 (×3): qty 2
  Filled 2017-03-05: qty 1
  Filled 2017-03-05: qty 2
  Filled 2017-03-05: qty 1
  Filled 2017-03-05: qty 2

## 2017-03-05 MED ORDER — CHLORHEXIDINE GLUCONATE 0.12 % MT SOLN
15.0000 mL | Freq: Two times a day (BID) | OROMUCOSAL | Status: DC
  Administered 2017-03-05 – 2017-03-16 (×21): 15 mL via OROMUCOSAL
  Filled 2017-03-05 (×16): qty 15

## 2017-03-05 MED ORDER — ORAL CARE MOUTH RINSE: 15.0000 mL | Freq: Two times a day (BID) | OROMUCOSAL | Status: DC

## 2017-03-05 MED ORDER — POTASSIUM CHLORIDE 10 MEQ/50ML IV SOLN
10.0000 meq | INTRAVENOUS | Status: AC
  Administered 2017-03-05 (×4): 10 meq via INTRAVENOUS
  Filled 2017-03-05 (×3): qty 50

## 2017-03-05 MED ORDER — POTASSIUM CHLORIDE 10 MEQ/50ML IV SOLN
10.0000 meq | INTRAVENOUS | Status: AC
  Administered 2017-03-05 (×4): 10 meq via INTRAVENOUS
  Filled 2017-03-05 (×6): qty 50

## 2017-03-05 MED ORDER — FOLIC ACID 5 MG/ML IJ SOLN
1.0000 mg | Freq: Every day | INTRAMUSCULAR | Status: DC
  Administered 2017-03-05 – 2017-03-11 (×7): 1 mg via INTRAVENOUS
  Filled 2017-03-05 (×7): qty 0.2

## 2017-03-05 MED ORDER — ORAL CARE MOUTH RINSE
15.0000 mL | Freq: Two times a day (BID) | OROMUCOSAL | Status: DC
  Administered 2017-03-05 – 2017-03-16 (×16): 15 mL via OROMUCOSAL

## 2017-03-05 MED ORDER — FUROSEMIDE 10 MG/ML IJ SOLN
40.0000 mg | Freq: Two times a day (BID) | INTRAMUSCULAR | Status: AC
  Administered 2017-03-05 (×2): 40 mg via INTRAVENOUS
  Filled 2017-03-05 (×2): qty 4

## 2017-03-05 NOTE — Progress Notes (Signed)
[facility] ADULT ICU REPLACEMENT PROTOCOL FOR AM LAB REPLACEMENT ONLY  The patient does apply for the [facility] Adult ICU Electrolyte Replacment Protocol based on the criteria listed below:   1. Is GFR >/= 40 ml/min? Yes.    Patient's GFR today is >60 2. Is urine output >/= 0.5 ml/kg/hr for the last 6 hours? Yes.   Patient's UOP is 0.9 ml/kg/hr 3. Is BUN < 60 mg/dL? Yes.    Patient's BUN today is 20 4. Abnormal electrolyte(s): k 3.1 5. Ordered repletion with: protocol 6. If a panic level lab has been reported, has the CCM MD in charge been notified? No..   Physician:    Ronda Fairly A 03/05/2017 5:47 AM

## 2017-03-05 NOTE — Progress Notes (Addendum)
[facility] Attending Note  ADMISSION DATE:02/22/2017  REFERRING MD:Sam Winfred Leeds, M.D. / EDP  CHIEF COMPLAINT:Acute Encephalopathy  Presenting HPI:  66 y.o. female former smoker. Found unresponsive after she called EMS. Patient had agonal respirations and hyperthermia. Required endotracheal intubation for airway protection. Known history of COPD, coronary disease, depression, schizophrenia, dystonia, seizure disorder, substance abuse, chronic pain, and hypothyroidism. Patient has not been on treatment for her hypothyroidism.  Subjective:  Patient self extubated yesterday. No acute events overnight. Patient somnolent this morning.  Review of Systems:  Unable to obtain given altered mental status.   Vent Mode: BIPAP FiO2 (%):  [40 %-96 %] 96 % Set Rate:  [10 bmp] 10 bmp PEEP:  [5 cmH20-6 cmH20] 6 cmH20 Pressure Support:  [5 WPY09-98 cmH20] 12 cmH20  Temp:  [97.9 F (36.6 C)-99.7 F (37.6 C)] 99.6 F (37.6 C) (11/25 0347) Pulse Rate:  [56-111] 70 (11/25 0700) Resp:  [13-23] 18 (11/25 0700) BP: (82-170)/(21-98) 131/98 (11/25 0700) SpO2:  [88 %-100 %] 91 % (11/25 0733) FiO2 (%):  [40 %-96 %] 96 % (11/25 0217) Weight:  [177 lb 4 oz (80.4 kg)] 177 lb 4 oz (80.4 kg) (11/25 0317)  Gen.: Eyes closed. Sleeping. No distress. Integument: Warm. Dry. No rash appreciated. Neurological: Opens eyes and nods with significant stimulation. Moving extremities spontaneously. Very sedated on Precedex infusion. Cardiovascular: Regular rate. No JVD appreciated. No edema. Pulmonary: No stridor. Normal work of breathing on nasal cannula. Slightly decreased breath sounds in bilateral lung bases. Abdomen: Soft. Nondistended. Normal bowel sounds.  LINES/TUBES: OETT 11/14 - 11/24 (self-extubated) OGT 11/14 - 11/24 R IJ CVL 11/15 >>> Foley >>> PIV  CBC Latest Ref Rng & Units 03/05/2017 03/04/2017 03/04/2017  WBC 4.0 - 10.5 K/uL 10.1 - 9.8  Hemoglobin 12.0 - 15.0 g/dL  9.1(L) 10.4(L) 9.2(L)  Hematocrit 36.0 - 46.0 % 26.1(L) 31.3(L) 28.0(L)  Platelets 150 - 400 K/uL 246 - 203    BMP Latest Ref Rng & Units 03/05/2017 03/04/2017 03/03/2017  Glucose 65 - 99 mg/dL 118(H) 129(H) 133(H)  BUN 6 - 20 mg/dL 20 23(H) 20  Creatinine 0.44 - 1.00 mg/dL 0.74 0.67 0.67  Sodium 135 - 145 mmol/L 135 135 135  Potassium 3.5 - 5.1 mmol/L 3.1(L) 3.5 3.1(L)  Chloride 101 - 111 mmol/L 90(L) 88(L) 87(L)  CO2 22 - 32 mmol/L 34(H) 37(H) 38(H)  Calcium 8.9 - 10.3 mg/dL 8.2(L) 8.0(L) 7.9(L)    Hepatic Function Latest Ref Rng & Units 03/05/2017 03/04/2017 03/03/2017  Total Protein 6.5 - 8.1 g/dL - - -  Albumin 3.5 - 5.0 g/dL 3.3(L) 2.8(L) 3.0(L)  AST 15 - 41 U/L - - -  ALT 14 - 54 U/L - - -  Alk Phosphatase 38 - 126 U/L - - -  Total Bilirubin 0.3 - 1.2 mg/dL - - -    IMAGING/STUDIES: UDS 11/14:  Negative  CONTINUOUS EEG 11/16:  This was amoderatelyabnormal continuous video EEG due to loss of normal background, diffuse sharply contoured slowing(resolved), and a partial burst-suppression pattern. This was indicative of an improvingglobal cerebral disturbance.No seizuresor definite epileptiform dischargeswere seen, however sharp activity was present early in the record. MRI BRAIN W/O 11/18: 1. Stable and without explanation for symptoms. 2. Mild periventricular white matter disease. 3. Sinusitis with ethmoid and sphenoid fluid levels, also seen on prior. Bilateral partial mastoid opacification. MRI C-SPINE W/O 11/18: 1. Prominent edema in the bilateral neck with retropharyngeal collection. The submandibular glands appears thickened and edematous and this may be  submandibular sialadenitis (bilaterality suggesting viral/systemic process). Suppurative infection is not excluded. 2. No acute finding to explain left arm deficit. 3. Disc and facet degeneration as described. Spinal stenosis with mild cord flattening at C5-6. Foraminal narrowings noted above. PORT CXR 11/19:   Previously reviewed by me. Good positioning of endotracheal tube. Enteric feeding tube coursing below the diaphragm. Central venous catheter in superior vena cava. Questionable patchy opacity left lung base. No new focal opacity appreciated. PORT CXR 11/21:  Previously reviewed by me. Questionable hazy left midlung opacity. Silhouetting left hemidiaphragm. Endotracheal tube in the level of the carina. Low lung volumes. PORT CXR 11/22:  Previously reviewed by me. Minimal basilar opacity suggestive of atelectasis. Lordotic view. Endotracheal tube in good position. Right internal jugular central venous catheter in good position. Enteric feeding tube coursing below diaphragm. EEG 11/23:  This is an abnormal EEG.  There is mild generalized slowing of brain activity, which is non-specific, but may be due to toxic, metabolic, infectious, or hypoxic etiologies.  Clinical correlation is recommended.  The patient is not in non-convulsive status epilepticus.  MICROBIOLOGY: MRSA PCR :  Positive  Blood Cultures x2 11/14:  Negative  Tracheal Aspirate Culture 11/14:  MRSA Urine Streptococcal Antigen 11/14:  Negative  Urine Legionella Antigen 11/14:  Negative  Tracheal Aspirate Culture 11/22 >>> Urine Culture 11/22:  Negative  Blood Cultures x2 11/22 >>>  ANTIBIOTICS: Levaquin 11/14 - 11/15 Aztreonam 11/14 - 11/15 Cefepime 11/15 - 11/18 Vancomycin 11/14 - 11/23   SIGNIFICANT EVENTS: 11/14 - Admit 11/15 - Continuous EEG 11/20 - Improving mentation on Precedex. No cuff leak. Tolerating PS 5/5.  11/22 - Patient apneic on sedation w/ SBT. FUO >> cultures repeated. 11/23 - No cuff leak. Following commands and much more awake & cooperative. 11/24 - Still no cuff leak >> consulted ENT. Self Extubated w/ subsequent stridor >> tx w/ Decadron, Lasix, Epi & BiPAP.  ASSESSMENT/PLAN:  66 y.o. female with history of psychiatric illness including polysubstance abuse and schizophrenia. Self extubated yesterday. Post  extubation stridor stabilized somewhat. Respiratory status remains stable.   1. Acute hypoxic respiratory failure: Continuing incentive spirometry. Weaning FiO2. Monitoring continuous pulse oximetry. Continuing diuresis with Lasix as blood pressure allows.  2. Retropharyngeal edema: ENT consulted. Plan for repeat CT neck with contrast once patient stabilizes. Continuing course of Decadron. Continuing Lasix as blood pressure allows.  3. Acute encephalopathy: Improving. Switching thiamine and IV daily. Weaning Precedex infusion giving sedation.  4. FUO: Repeat cultures pending. Holding on further antibiotic therapy at this time.  5. Hypothyroidism: Continuing IV Synthroid. Syndrome not consistent with myxedema coma. 6. MRSA pneumonia: Finished 10 days of treatment with vancomycin. 7. Adrenal insufficiency: Currently doing a course of Decadron. 8. History of schizophrenia & depression: Holding Abilify, Ativan, Effexor, Cogentin, and Restoril. 9. Epilepsy: Continuing Keppra & Depakote. Neurology following. Holding outpatient Neurontin. 10. Chronic pain: Holding Baclofen and Neurontin. No Fentanyl given sedation. 11. Polysubstance abuse: Continuing thiamine and folic acid IV daily. Weaning Precedex infusion. 12. COPD: No signs of acute exacerbation. Continuing Duonebs every 6 hours.  Prophylaxis:  Heparin [place] q8hr, SCDs, & Protonix via tube qhs. Diet:  NPO. Tube Feedings per dietary recommendations. Code Status:  Limited Code after my discussion with her sister yesterday. Reintubation is acceptable. Disposition:  Tenuous respiratory status. Patient to remain in ICU. Family Update:  Sister updated via phone 11/24 after self-extubation.  I have personally spent a total of 33 minutes of critical care time today caring for the patient &  reviewing the patient's electronic medical record.  Sonia Baller Ashok Cordia, M.D. [facility] Pager:  [phone] After 7pm or if no response, call  [phone] 7:52 AM 03/05/17

## 2017-03-06 ENCOUNTER — Inpatient Hospital Stay (HOSPITAL_COMMUNITY): Payer: Medicare Other

## 2017-03-06 ENCOUNTER — Encounter (HOSPITAL_COMMUNITY): Payer: Self-pay | Admitting: *Deleted

## 2017-03-06 LAB — CBC WITH DIFFERENTIAL/PLATELET
BASOS ABS: 0 10*3/uL (ref 0.0–0.1)
Basophils Relative: 0 %
EOS PCT: 1 %
Eosinophils Absolute: 0.1 10*3/uL (ref 0.0–0.7)
HEMATOCRIT: 26.1 % — AB (ref 36.0–46.0)
Hemoglobin: 8.8 g/dL — ABNORMAL LOW (ref 12.0–15.0)
LYMPHS ABS: 2.9 10*3/uL (ref 0.7–4.0)
LYMPHS PCT: 32 %
MCH: 32.5 pg (ref 26.0–34.0)
MCHC: 33.7 g/dL (ref 30.0–36.0)
MCV: 96.3 fL (ref 78.0–100.0)
MONO ABS: 0.8 10*3/uL (ref 0.1–1.0)
Monocytes Relative: 9 %
NEUTROS ABS: 5.1 10*3/uL (ref 1.7–7.7)
Neutrophils Relative %: 58 %
Platelets: 226 10*3/uL (ref 150–400)
RBC: 2.71 MIL/uL — ABNORMAL LOW (ref 3.87–5.11)
RDW: 14.2 % (ref 11.5–15.5)
WBC: 8.9 10*3/uL (ref 4.0–10.5)

## 2017-03-06 LAB — RENAL FUNCTION PANEL
ANION GAP: 10 (ref 5–15)
Albumin: 3.2 g/dL — ABNORMAL LOW (ref 3.5–5.0)
BUN: 21 mg/dL — ABNORMAL HIGH (ref 6–20)
CALCIUM: 8.2 mg/dL — AB (ref 8.9–10.3)
CHLORIDE: 94 mmol/L — AB (ref 101–111)
CO2: 34 mmol/L — AB (ref 22–32)
Creatinine, Ser: 0.73 mg/dL (ref 0.44–1.00)
Glucose, Bld: 94 mg/dL (ref 65–99)
Phosphorus: 3.2 mg/dL (ref 2.5–4.6)
Potassium: 3.1 mmol/L — ABNORMAL LOW (ref 3.5–5.1)
Sodium: 138 mmol/L (ref 135–145)

## 2017-03-06 LAB — MAGNESIUM: MAGNESIUM: 2.3 mg/dL (ref 1.7–2.4)

## 2017-03-06 LAB — GLUCOSE, CAPILLARY
GLUCOSE-CAPILLARY: 82 mg/dL (ref 65–99)
GLUCOSE-CAPILLARY: 93 mg/dL (ref 65–99)
GLUCOSE-CAPILLARY: 96 mg/dL (ref 65–99)
Glucose-Capillary: 82 mg/dL (ref 65–99)
Glucose-Capillary: 83 mg/dL (ref 65–99)
Glucose-Capillary: 96 mg/dL (ref 65–99)

## 2017-03-06 MED ORDER — POTASSIUM CHLORIDE 10 MEQ/50ML IV SOLN
10.0000 meq | INTRAVENOUS | Status: AC
  Administered 2017-03-06 (×4): 10 meq via INTRAVENOUS
  Filled 2017-03-06 (×4): qty 50

## 2017-03-06 MED ORDER — IOPAMIDOL (ISOVUE-300) INJECTION 61%
INTRAVENOUS | Status: AC
  Administered 2017-03-06: 75 mL
  Filled 2017-03-06: qty 75

## 2017-03-06 MED ORDER — BACLOFEN 5 MG HALF TABLET
5.0000 mg | ORAL_TABLET | Freq: Two times a day (BID) | ORAL | Status: DC
  Administered 2017-03-09 – 2017-03-17 (×17): 5 mg
  Filled 2017-03-06 (×26): qty 1

## 2017-03-06 NOTE — Progress Notes (Signed)
PULMONARY / CRITICAL CARE MEDICINE   Name: Dominique Acosta MRN: [id number] DOB: [birth year]    ADMISSION DATE:  02/22/2017 CONSULTATION DATE:    Dominique Chance MD:  [name], M.D. / EDP  CHIEF COMPLAINT: Acute Encephalopathy  HISTORY OF PRESENT ILLNESS:    66 y.o. female former smoker. Found unresponsive after she called EMS. Patient had agonal respirations and hyperthermia. Required endotracheal intubation for airway protection. Known history of COPD, coronary disease, depression, schizophrenia, dystonia, seizure disorder, substance abuse, chronic pain, and hypothyroidism. Patient has not been on treatment for her hypothyroidism.  PAST MEDICAL HISTORY :  She  has a past medical history of Arm pain, Chronic pain, COPD (chronic obstructive pulmonary disease) ([place]), Coronary artery disease, Depression, Dystonia, Epilepsy ([place]), High cholesterol, Humerus fracture, Hypothyroid, Insomnia, Leukemia ([place]), Myocardial infarct ([place]), Psychosis ([place]), Schizophrenia ([place]), and Seizure ([place]).  PAST SURGICAL HISTORY: She  has a past surgical history that includes Shoulder surgery; Humerus fracture surgery; and External ear surgery.  Allergies  Allergen Reactions  . Amoxicillin Anaphylaxis and Other (See Comments)    Childhood allergy >> tolerated cephalosporins  Has patient had a PCN reaction causing immediate rash, facial/tongue/throat swelling, SOB or lightheadedness with hypotension: Yes Has patient had a PCN reaction causing severe rash involving mucus membranes or skin necrosis: No Has patient had a PCN reaction that required hospitalization No Has patient had a PCN reaction occurring within the last 10 years: No If all of the above answers are "NO", then may proceed with Cephalosporin use.  Marland Kitchen Penicillins Anaphylaxis and Other (See Comments)    Childhood allergy Has patient had a PCN reaction causing immediate rash, facial/tongue/throat swelling, SOB or lightheadedness with hypotension: Yes Has  patient had a PCN reaction causing severe rash involving mucus membranes or skin necrosis: No Has patient had a PCN reaction that required hospitalization No Has patient had a PCN reaction occurring within the last 10 years: No If all of the above answers are "NO", then may proceed with Cephalosporin use.   Marland Kitchen Phenytoin Other (See Comments)    Reaction:  CNS disorder   . Keflex [Cephalexin] Diarrhea  . Dilaudid [Hydromorphone Hcl] Other (See Comments)    Reaction:  Psychosis   . Haldol [Haloperidol] Other (See Comments)    Reaction:  Psychosis   . Morphine And Related Other (See Comments)    Reaction:  Psychosis     No current facility-administered medications on file prior to encounter.    Current Outpatient Medications on File Prior to Encounter  Medication Sig  . ADVAIR DISKUS 250-50 MCG/DOSE AEPB Take 1 puff by mouth daily.  . baclofen (LIORESAL) 10 MG tablet Take 10 mg by mouth 3 (three) times daily as needed for muscle spasms.  Marland Kitchen gabapentin (NEURONTIN) 800 MG tablet Take 800 mg 3 (three) times daily by mouth.   Marland Kitchen LORazepam (ATIVAN) 1 MG tablet Take 1 tablet (1 mg total) by mouth 2 (two) times daily as needed for anxiety.  Marland Kitchen PROAIR HFA 108 (90 Base) MCG/ACT inhaler Inhale 1-2 puffs every 4 (four) hours as needed into the lungs for shortness of breath.  . temazepam (RESTORIL) 30 MG capsule Take 30 mg by mouth at bedtime as needed for sleep.  Marland Kitchen atorvastatin (LIPITOR) 20 MG tablet Take 20 mg by mouth at bedtime. Take one tablet daily for hyperlipidemia   . benztropine (COGENTIN) 2 MG tablet Take 1 tablet (2 mg total) by mouth 3 (three) times daily.  . ondansetron (ZOFRAN) 8 MG tablet Take 1 tablet (  8 mg total) by mouth every 8 (eight) hours as needed for nausea or vomiting.  . venlafaxine XR (EFFEXOR-XR) 150 MG 24 hr capsule Take 300 mg by mouth daily with breakfast.    FAMILY HISTORY:  Her indicated that her mother is deceased. She indicated that her father is deceased.   SOCIAL  HISTORY: She  reports that she has quit smoking. Her smoking use included cigarettes. She has a 82.00 pack-year smoking history. she has never used smokeless tobacco. She reports that she does not drink alcohol or use drugs.  REVIEW OF SYSTEMS:   Unable to obtain due to encephalopathy  SUBJECTIVE:  No acute events overnight.  Stable resp status  VITAL SIGNS: BP 105/67   Pulse 85   Temp 98.8 F (37.1 C) (Oral)   Resp 17   Ht 5\' 1"  (1.549 m)   Wt 178 lb 5.6 oz (80.9 kg)   SpO2 97%   BMI 33.70 kg/m   HEMODYNAMICS:    VENTILATOR SETTINGS: FiO2 (%):  [95 %] 95 %  INTAKE / OUTPUT: I/O last 3 completed shifts: In: 2087.3 [I.V.:1087.3; IV Piggyback:1000] Out: 2855 [Urine:2855]  PHYSICAL EXAMINATION: Blood pressure 105/67, pulse 85, temperature 98.8 F (37.1 C), temperature source Oral, resp. rate 17, height 5\' 1"  (1.549 m), weight 178 lb 5.6 oz (80.9 kg), SpO2 97 %. Gen:      No acute distress HEENT:  EOMI, sclera anicteric Neck:     No masses; no thyromegaly. No stridor, hoarse voice  Lungs:    Clear to auscultation bilaterally; normal respiratory effort CV:         Regular rate and rhythm; no murmurs Abd:      + bowel sounds; soft, non-tender; no palpable masses, no distension Ext:    No edema; adequate peripheral perfusion Skin:      Warm and dry; no rash Neuro: alert and oriented x 3 Psych: normal mood and affect  LABS:  BMET Recent Labs  Lab 03/05/17 0419 03/05/17 1703 03/06/17 0417  NA 135 135 138  K 3.1* 3.3* 3.1*  CL 90* 92* 94*  CO2 34* 33* 34*  BUN 20 19 21*  CREATININE 0.74 0.70 0.73  GLUCOSE 118* 109* 94    Electrolytes Recent Labs  Lab 03/04/17 0615 03/05/17 0418 03/05/17 0419 03/05/17 1703 03/06/17 0417  CALCIUM 8.0*  --  8.2* 8.2* 8.2*  MG 2.1 2.1  --  2.3 2.3  PHOS 2.3*  --  2.7  --  3.2    CBC Recent Labs  Lab 03/04/17 0615  03/05/17 0418 03/05/17 1703 03/06/17 0417  WBC 9.8  --  10.1  --  8.9  HGB 9.2*   < > 9.1* 8.8* 8.8*   HCT 28.0*   < > 26.1* 26.4* 26.1*  PLT 203  --  246  --  226   < > = values in this interval not displayed.    Coag's No results for input(s): APTT, INR in the last 168 hours.  Sepsis Markers No results for input(s): LATICACIDVEN, PROCALCITON, O2SATVEN in the last 168 hours.  ABG No results for input(s): PHART, PCO2ART, PO2ART in the last 168 hours.  Liver Enzymes Recent Labs  Lab 03/04/17 0615 03/05/17 0419 03/06/17 0417  ALBUMIN 2.8* 3.3* 3.2*    Cardiac Enzymes No results for input(s): TROPONINI, PROBNP in the last 168 hours.  Glucose Recent Labs  Lab 03/05/17 1213 03/05/17 1723 03/05/17 1916 03/06/17 0034 03/06/17 0352 03/06/17 0851  GLUCAP 95 104* 99 82  82 93    IMAGING/STUDIES: UDS 11/14: Negative  CONTINUOUS EEG 11/16: This was amoderatelyabnormal continuous video EEG due to loss of normal background, diffuse sharply contoured slowing(resolved), and a partial burst-suppression pattern. This was indicative of an improvingglobal cerebral disturbance.No seizuresor definite epileptiform dischargeswere seen, however sharp activity was present early in the record. MRI BRAIN W/O 11/18: 1. Stable and without explanation for symptoms. 2. Mild periventricular white matter disease. 3. Sinusitis with ethmoid and sphenoid fluid levels, also seen on prior. Bilateral partial mastoid opacification. MRI C-SPINE W/O 11/18: 1. Prominent edema in the bilateral neck with retropharyngeal collection. The submandibular glands appears thickened and edematous and this may be submandibular sialadenitis (bilaterality suggesting viral/systemic process). Suppurative infection is not excluded. 2. No acute finding to explain left arm deficit. 3. Disc and facet degeneration as described. Spinal stenosis with mild cord flattening at C5-6. Foraminal narrowings noted above.  MICROBIOLOGY: MRSA PCR : Positive  Blood Cultures x2 11/14: Negative  Tracheal Aspirate Culture 11/14:  MRSA Urine Streptococcal Antigen 11/14: Negative  Urine Legionella Antigen 11/14: Negative  Tracheal Aspirate Culture 11/22 >>> Urine Culture 11/22:  Negative  Blood Cultures x2 11/22 >>>  ANTIBIOTICS: Levaquin 11/14 - 11/15 Aztreonam 11/14 - 11/15 Cefepime 11/15 - 11/18 Vancomycin 11/14 - 11/23   SIGNIFICANT EVENTS: 11/14 - Admit 11/15 - Continuous EEG 11/20 - Improving mentation on Precedex. No cuff leak. Tolerating PS 5/5.  11/22 - Patient apneic on sedation w/ SBT. FUO >> cultures repeated. 11/23 - No cuff leak. Following commands and much more awake & cooperative. 11/24 - Still no cuff leak >> consulted ENT. Self Extubated w/ subsequent stridor >> tx w/ Decadron, Lasix, Epi & BiPAP   DISCUSSION: 66 year old with polysubstance abuse, schizophrenia admitted with altered mental status, hypothermia, hypothyroidism, adrenal insufficiency Acute hypoxic respiratory failure  ASSESSMENT / PLAN:  PULMONARY A: Acute hypoxic respiratory failure Retropharyngeal edema. P:   Stable post extubation. ENT on board for evaluation of retropharyngeal edema Finished decadon and lasix CT neck Can transfer out of ICU today  RENAL A:   Stable P:   Replete K  GASTROINTESTINAL A:   No issues P:   Keep NPO for now Swallow eval if CT neck is OK and cleared by ENT  INFECTIOUS A:   MRSA PNA P:   S/p vanco for 10 days  ENDOCRINE A:   Hypothyroidism  P:   Continue IV synthroid Follow TSH  NEUROLOGIC A:   Epilepsy Acute encephalopathy P:   Continue keppra and depakote. Mental status is improving.   FAMILY  - Updates: Patient updated at bedside.  - Inter-disciplinary family meet or Palliative Care meeting due by:  Day Fenton MD [facility] Pager [phone] If no answer or after 3pm call: [phone] 03/06/2017, 10:55 AM

## 2017-03-06 NOTE — Progress Notes (Signed)
[facility] ADULT ICU REPLACEMENT PROTOCOL FOR AM LAB REPLACEMENT ONLY  The patient does apply for the [facility] Adult ICU Electrolyte Replacment Protocol based on the criteria listed below:   1. Is GFR >/= 40 ml/min? Yes.    Patient's GFR today is >60 2. Is urine output >/= 0.5 ml/kg/hr for the last 6 hours? Yes.   Patient's UOP is 0.8 ml/kg/hr 3. Is BUN < 60 mg/dL? Yes.    Patient's BUN today is 21 4. Abnormal electrolyte(s): K 3.1 5. Ordered repletion with: protocol 6. If a panic level lab has been reported, has the CCM MD in charge been notified? No..   Physician:    Ronda Fairly A 03/06/2017 5:21 AM

## 2017-03-06 NOTE — Progress Notes (Signed)
Dr Vaughan Browner informed that patient is not receiving some of her PO meds due to NPO status . Orders to get CT before attempting swallowing study

## 2017-03-07 LAB — GLUCOSE, CAPILLARY
GLUCOSE-CAPILLARY: 78 mg/dL (ref 65–99)
GLUCOSE-CAPILLARY: 83 mg/dL (ref 65–99)
GLUCOSE-CAPILLARY: 70 mg/dL (ref 65–99)
Glucose-Capillary: 68 mg/dL (ref 65–99)
Glucose-Capillary: 73 mg/dL (ref 65–99)
Glucose-Capillary: 76 mg/dL (ref 65–99)
Glucose-Capillary: 77 mg/dL (ref 65–99)

## 2017-03-07 LAB — BASIC METABOLIC PANEL
ANION GAP: 11 (ref 5–15)
BUN: 18 mg/dL (ref 6–20)
CALCIUM: 8.4 mg/dL — AB (ref 8.9–10.3)
CO2: 30 mmol/L (ref 22–32)
CREATININE: 0.64 mg/dL (ref 0.44–1.00)
Chloride: 97 mmol/L — ABNORMAL LOW (ref 101–111)
Glucose, Bld: 84 mg/dL (ref 65–99)
Potassium: 3 mmol/L — ABNORMAL LOW (ref 3.5–5.1)
SODIUM: 138 mmol/L (ref 135–145)

## 2017-03-07 LAB — CBC
HCT: 27.8 % — ABNORMAL LOW (ref 36.0–46.0)
Hemoglobin: 9 g/dL — ABNORMAL LOW (ref 12.0–15.0)
MCH: 31.5 pg (ref 26.0–34.0)
MCHC: 32.4 g/dL (ref 30.0–36.0)
MCV: 97.2 fL (ref 78.0–100.0)
PLATELETS: 298 10*3/uL (ref 150–400)
RBC: 2.86 MIL/uL — AB (ref 3.87–5.11)
RDW: 14.5 % (ref 11.5–15.5)
WBC: 11.8 10*3/uL — AB (ref 4.0–10.5)

## 2017-03-07 LAB — CULTURE, BLOOD (ROUTINE X 2)
CULTURE: NO GROWTH
CULTURE: NO GROWTH
SPECIAL REQUESTS: ADEQUATE
SPECIAL REQUESTS: ADEQUATE

## 2017-03-07 LAB — MAGNESIUM: MAGNESIUM: 2.4 mg/dL (ref 1.7–2.4)

## 2017-03-07 LAB — PHOSPHORUS: PHOSPHORUS: 2.6 mg/dL (ref 2.5–4.6)

## 2017-03-07 MED ORDER — POTASSIUM CHLORIDE 10 MEQ/50ML IV SOLN
10.0000 meq | INTRAVENOUS | Status: AC
  Administered 2017-03-07 (×6): 10 meq via INTRAVENOUS
  Filled 2017-03-07 (×6): qty 50

## 2017-03-07 MED ORDER — DEXTROSE 5 % IV SOLN
INTRAVENOUS | Status: DC
  Administered 2017-03-07 – 2017-03-11 (×4): via INTRAVENOUS

## 2017-03-07 NOTE — Progress Notes (Signed)
ENT Consult Progress Note  Subjective: Patient remains in the ICU on 2L [place]. She self-extubated a few days ago and was weaned to BiPap, now on nasal cannula within the last 24 h.  MRI head and neck had previously demonstrated retropharyngeal fluid collection. This was re-imaged yesterday with neck CT. This was personally reviewed, see below   Objective: Vitals:   03/07/17 0700 03/07/17 0800  BP: (!) 104/53 (!) 119/46  Pulse: 80 81  Resp: 17 20  Temp:    SpO2: 100% 97%    Physical Exam: CONSTITUTIONAL: well developed, nourished, no distress and alert, partially  CARDIOVASCULAR: normal rate and regular rhythm PULMONARY/CHEST WALL: effort normal and no stridor, no stertor, +moderately severe dysphonia with breathy voice HENT: Head : normocephalic and atraumatic Ears: Right ear:   canal normal, external ear normal and hearing normal Left ear:   canal normal, external ear normal and hearing normal Nose: nose normal and no purulence Mouth/Throat:  Mouth: uvula midline and no oral lesions, edentulous. FOM soft, no trismus Throat: oropharynx clear and moist Mucous membranes: normal EYES: conjunctiva normal, EOM normal and PERRL NECK: supple, trachea normal and no thyromegaly or cervical LAD     Data Review/Consults/Discussions:  CT necdk 03/07/17- no retropharyngeal fluid collection identified. No masses or lesions. Some thickening along the glottis, especially on the left likely represents false and true cord edema from recent intubaion  Assessment: Dominique Acosta 66 y.o. female who presents with retropharyngeal fluid collection, now resolved on CT neck. Patient now has moderately severe dysphonia   Plan:  -Bedside swallow evaluation, swallow studies per SLP -Will plan transnasal laryngoscopy to further evaluate dysphonia likely tomorrow -Will follow   Thank you for involving [facility] in the care of this patient. Should you need further assistance, please  call our office at [phone].    Gavin Pound, MD

## 2017-03-07 NOTE — Progress Notes (Signed)
Nutrition Follow-up  DOCUMENTATION CODES:   Obesity unspecified  INTERVENTION:    If unable to advance diet by tomorrow (Wednesday), recommend place Cortrak tube or NG tube for feedings.  Recommend Jevity 1.2 at 45 ml/h with Pro-stat 30 ml BID to provide 1496 kcal, 90 gm protein, 875 ml free water daily  NUTRITION DIAGNOSIS:   Inadequate oral intake related to inability to eat as evidenced by NPO status.  Ongoing  GOAL:   Patient will meet greater than or equal to 90% of their needs  Unmet  MONITOR:   Diet advancement, PO intake  ASSESSMENT:   66 yo female with PMH of epilepsy, hypothyroidism, HLD, depression, CAD, leukemia, MI, COPD, schizophrenia, psychosis who was admitted on 11/14 with hypoxic acute respiratory failure, aspiration PNA, requiring intubation on admission.  Patient self-extubated on 11/24. OGT has been removed. TF off since 11/24. Remains NPO.  SLP following for ability to complete FEES. SLP recommends short term alternative means of nutrition. Labs and medications reviewed.  Diet Order:  Diet NPO time specified  EDUCATION NEEDS:   No education needs have been identified at this time  Skin:  Skin Assessment: Reviewed RN Assessment  Last BM:  11/26  Height:   Ht Readings from Last 1 Encounters:  02/22/17 5\' 1"  (1.549 m)    Weight:   Wt Readings from Last 1 Encounters:  03/07/17 177 lb 0.5 oz (80.3 kg)    Ideal Body Weight:  47.7 kg  BMI:  Body mass index is 33.45 kg/m.  Estimated Nutritional Needs:   Kcal:  1400-1600  Protein:  85-95 gm  Fluid:  1.4-1.6 L   Molli Barrows, RD, LDN, [place] Pager [phone] After Hours Pager [phone]

## 2017-03-07 NOTE — Care Management Note (Signed)
Case Management Note  Patient Details  Name: Dominique Acosta MRN: [id number] Date of Birth: March 18, 1951  Subjective/Objective:    Pt admitted with acute encephhalopathy                  Action/Plan:   PTA is independent from home.  Pt is now extubated on [place].  PT eval ordered.  CM will continue to follow for discharge needs   Expected Discharge Date:                  Expected Discharge Plan:     In-House Referral:  Clinical Social Work  Discharge planning Services  CM Consult  Post Acute Care Choice:    Choice offered to:     DME Arranged:    DME Agency:     HH Arranged:    [place] Agency:     Status of Service:  In process, will continue to follow  If discussed at Long Length of Stay Meetings, dates discussed:    Additional Comments:  Maryclare Labrador, RN 03/07/2017, 11:50 AM

## 2017-03-07 NOTE — Evaluation (Signed)
Clinical/Bedside Swallow Evaluation Patient Details  Name: Dominique Acosta MRN: [id number] Date of Birth: 1950/08/23  Today's Date: 03/07/2017 Time: SLP Start Time (ACUTE ONLY): 0934 SLP Stop Time (ACUTE ONLY): 0952 SLP Time Calculation (min) (ACUTE ONLY): 18 min  Past Medical History:  Past Medical History:  Diagnosis Date  . Arm pain   . Chronic pain   . COPD (chronic obstructive pulmonary disease) ([place])   . Coronary artery disease   . Depression   . Dystonia   . Epilepsy ([place])   . High cholesterol   . Humerus fracture   . Hypothyroid   . Insomnia   . Leukemia ([place])   . Myocardial infarct ([place])   . Psychosis ([place])   . Schizophrenia ([place])   . Seizure [facility])    Past Surgical History:  Past Surgical History:  Procedure Laterality Date  . EXTERNAL EAR SURGERY    . HUMERUS FRACTURE SURGERY    . SHOULDER SURGERY     HPI:  Pt is a 66 y.o.female former smoker who was found unresponsive after she called EMS. Patient had agonal respirations and hyperthermia. She required endotracheal intubation for airway protection 11/14. ENT was consulted when she did not have a cuff leak and MRI showed retropharyngeal fluid collection (resolved on CT Neck 11/26). Pt self-extubated on 11/24. Known history of COPD, coronary disease, MI, depression, schizophrenia, psychosis, dystonia, seizure disorder, substance abuse, chronic pain, and hypothyroidism.    Assessment / Plan / Recommendation Clinical Impression  Pt is very hoarse s/p prolonged intubation and self-extubation, with subjectively very weak cough. She is drowsy, arousing easily but requiring Mod cues to maintain alertness and sustain attention to PO trials. She is confused and perseverative. Audible congestion is noted after small amounts of thin liquids and purees, with cued cough weak and now wet in quality. Pt is at risk for silent aspiration with likely little reserves to protect her airway should she aspirate as her cough seems so weak.  Recommend that she remain NPO for now except for a few ice chips given by RN after oral care to moisten her mucosa. SLP will continue to follow for readiness to participate in FEES, likely to be done as mentation improves. SLP Visit Diagnosis: Dysphagia, unspecified (R13.10)    Aspiration Risk  Severe aspiration risk    Diet Recommendation NPO;Alternative means - temporary   Medication Administration: Via alternative means    Other  Recommendations Oral Care Recommendations: Oral care QID   Follow up Recommendations (tba)      Frequency and Duration min 2x/week  2 weeks       Prognosis Prognosis for Safe Diet Advancement: Good Barriers to Reach Goals: Cognitive deficits      Swallow Study   General HPI: Pt is a 66 y.o.female former smoker who was found unresponsive after she called EMS. Patient had agonal respirations and hyperthermia. She required endotracheal intubation for airway protection 11/14. ENT was consulted when she did not have a cuff leak and MRI showed retropharyngeal fluid collection (resolved on CT Neck 11/26). Pt self-extubated on 11/24. Known history of COPD, coronary disease, MI, depression, schizophrenia, psychosis, dystonia, seizure disorder, substance abuse, chronic pain, and hypothyroidism.  Type of Study: Bedside Swallow Evaluation Previous Swallow Assessment: none in chart Diet Prior to this Study: NPO Temperature Spikes Noted: No Respiratory Status: Nasal cannula History of Recent Intubation: Yes Length of Intubations (days): 11 days(self-extubated) Date extubated: 03/04/17 Behavior/Cognition: Lethargic/Drowsy;Requires cueing Oral Cavity Assessment: Within Functional Limits Oral Care Completed by SLP:  No Oral Cavity - Dentition: Edentulous Self-Feeding Abilities: Needs assist Patient Positioning: Upright in bed Baseline Vocal Quality: Hoarse Volitional Cough: Weak;Wet Volitional Swallow: (difficulty eliciting, but can)    Oral/Motor/Sensory  Function Overall Oral Motor/Sensory Function: Within functional limits   Ice Chips Ice chips: Impaired Presentation: Spoon Pharyngeal Phase Impairments: Wet Vocal Quality   Thin Liquid Thin Liquid: Impaired Presentation: Cup;Spoon Pharyngeal  Phase Impairments: Wet Vocal Quality;Multiple swallows    Nectar Thick Nectar Thick Liquid: Not tested   Honey Thick Honey Thick Liquid: Not tested   Puree Puree: Impaired Presentation: Spoon Pharyngeal Phase Impairments: Wet Vocal Quality   Solid   GO   Solid: Not tested        Germain Osgood 03/07/2017,10:08 AM  Germain Osgood, M.A. CCC-SLP [phone]

## 2017-03-07 NOTE — Progress Notes (Signed)
PULMONARY / CRITICAL CARE MEDICINE   Name: Dominique Acosta MRN: [id number] DOB: 1950/11/07    ADMISSION DATE:  02/22/2017 CONSULTATION DATE:    Dominique Chance MD:  [name], M.D. / EDP  CHIEF COMPLAINT: Acute Encephalopathy  HISTORY OF PRESENT ILLNESS:    66 y.o. female former smoker. Found unresponsive after she called EMS. Patient had agonal respirations and hyperthermia. Required endotracheal intubation for airway protection. Known history of COPD, coronary disease, depression, schizophrenia, dystonia, seizure disorder, substance abuse, chronic pain, and hypothyroidism. Patient has not been on treatment for her hypothyroidism.  PAST MEDICAL HISTORY :  She  has a past medical history of Arm pain, Chronic pain, COPD (chronic obstructive pulmonary disease) ([place]), Coronary artery disease, Depression, Dystonia, Epilepsy ([place]), High cholesterol, Humerus fracture, Hypothyroid, Insomnia, Leukemia ([place]), Myocardial infarct ([place]), Psychosis ([place]), Schizophrenia ([place]), and Seizure ([place]).  PAST SURGICAL HISTORY: She  has a past surgical history that includes Shoulder surgery; Humerus fracture surgery; and External ear surgery.  Allergies  Allergen Reactions  . Amoxicillin Anaphylaxis and Other (See Comments)    Childhood allergy >> tolerated cephalosporins  Has patient had a PCN reaction causing immediate rash, facial/tongue/throat swelling, SOB or lightheadedness with hypotension: Yes Has patient had a PCN reaction causing severe rash involving mucus membranes or skin necrosis: No Has patient had a PCN reaction that required hospitalization No Has patient had a PCN reaction occurring within the last 10 years: No If all of the above answers are "NO", then may proceed with Cephalosporin use.  Marland Kitchen Penicillins Anaphylaxis and Other (See Comments)    Childhood allergy Has patient had a PCN reaction causing immediate rash, facial/tongue/throat swelling, SOB or lightheadedness with hypotension: Yes Has  patient had a PCN reaction causing severe rash involving mucus membranes or skin necrosis: No Has patient had a PCN reaction that required hospitalization No Has patient had a PCN reaction occurring within the last 10 years: No If all of the above answers are "NO", then may proceed with Cephalosporin use.   Marland Kitchen Phenytoin Other (See Comments)    Reaction:  CNS disorder   . Keflex [Cephalexin] Diarrhea  . Dilaudid [Hydromorphone Hcl] Other (See Comments)    Reaction:  Psychosis   . Haldol [Haloperidol] Other (See Comments)    Reaction:  Psychosis   . Morphine And Related Other (See Comments)    Reaction:  Psychosis     No current facility-administered medications on file prior to encounter.    Current Outpatient Medications on File Prior to Encounter  Medication Sig  . ADVAIR DISKUS 250-50 MCG/DOSE AEPB Take 1 puff by mouth daily.  . baclofen (LIORESAL) 10 MG tablet Take 10 mg by mouth 3 (three) times daily as needed for muscle spasms.  Marland Kitchen gabapentin (NEURONTIN) 800 MG tablet Take 800 mg 3 (three) times daily by mouth.   Marland Kitchen LORazepam (ATIVAN) 1 MG tablet Take 1 tablet (1 mg total) by mouth 2 (two) times daily as needed for anxiety.  Marland Kitchen PROAIR HFA 108 (90 Base) MCG/ACT inhaler Inhale 1-2 puffs every 4 (four) hours as needed into the lungs for shortness of breath.  . temazepam (RESTORIL) 30 MG capsule Take 30 mg by mouth at bedtime as needed for sleep.  Marland Kitchen atorvastatin (LIPITOR) 20 MG tablet Take 20 mg by mouth at bedtime. Take one tablet daily for hyperlipidemia   . benztropine (COGENTIN) 2 MG tablet Take 1 tablet (2 mg total) by mouth 3 (three) times daily.  . ondansetron (ZOFRAN) 8 MG tablet Take 1 tablet (  8 mg total) by mouth every 8 (eight) hours as needed for nausea or vomiting.  . venlafaxine XR (EFFEXOR-XR) 150 MG 24 hr capsule Take 300 mg by mouth daily with breakfast.    FAMILY HISTORY:  Her indicated that her mother is deceased. She indicated that her father is deceased.   SOCIAL  HISTORY: She  reports that she has quit smoking. Her smoking use included cigarettes. She has a 82.00 pack-year smoking history. she has never used smokeless tobacco. She reports that she does not drink alcohol or use drugs.  REVIEW OF SYSTEMS:   Unable to obtain due to encephalopathy  SUBJECTIVE:  No acute events overnight.  Stable resp status  VITAL SIGNS: BP (!) 119/46 (BP Location: Left Arm)   Pulse 81   Temp 98.4 F (36.9 C) (Oral)   Resp 20   Ht 5\' 1"  (1.549 m)   Wt 177 lb 0.5 oz (80.3 kg)   SpO2 97%   BMI 33.45 kg/m   HEMODYNAMICS:    VENTILATOR SETTINGS:    INTAKE / OUTPUT: I/O last 3 completed shifts: In: 1400.4 [I.V.:460.4; IV Piggyback:940] Out: 2525 [Urine:2525]  PHYSICAL EXAMINATION: Blood pressure 105/67, pulse 85, temperature 98.8 F (37.1 C), temperature source Oral, resp. rate 17, height 5\' 1"  (1.549 m), weight 178 lb 5.6 oz (80.9 kg), SpO2 97 %. Gen:      No acute distress HEENT:  EOMI, sclera anicteric Neck:     No masses; no thyromegaly. No stridor, hoarse voice  Lungs:    Clear to auscultation bilaterally; normal respiratory effort CV:         Regular rate and rhythm; no murmurs Abd:      + bowel sounds; soft, non-tender; no palpable masses, no distension Ext:    No edema; adequate peripheral perfusion Skin:      Warm and dry; no rash Neuro: alert and oriented x 3 Psych: normal mood and affect  LABS:  BMET Recent Labs  Lab 03/05/17 1703 03/06/17 0417 03/07/17 0331  NA 135 138 138  K 3.3* 3.1* 3.0*  CL 92* 94* 97*  CO2 33* 34* 30  BUN 19 21* 18  CREATININE 0.70 0.73 0.64  GLUCOSE 109* 94 84    Electrolytes Recent Labs  Lab 03/05/17 0419 03/05/17 1703 03/06/17 0417 03/07/17 0331  CALCIUM 8.2* 8.2* 8.2* 8.4*  MG  --  2.3 2.3 2.4  PHOS 2.7  --  3.2 2.6    CBC Recent Labs  Lab 03/05/17 0418 03/05/17 1703 03/06/17 0417 03/07/17 0331  WBC 10.1  --  8.9 11.8*  HGB 9.1* 8.8* 8.8* 9.0*  HCT 26.1* 26.4* 26.1* 27.8*  PLT  246  --  226 298    Coag's No results for input(s): APTT, INR in the last 168 hours.  Sepsis Markers No results for input(s): LATICACIDVEN, PROCALCITON, O2SATVEN in the last 168 hours.  ABG No results for input(s): PHART, PCO2ART, PO2ART in the last 168 hours.  Liver Enzymes Recent Labs  Lab 03/04/17 0615 03/05/17 0419 03/06/17 0417  ALBUMIN 2.8* 3.3* 3.2*    Cardiac Enzymes No results for input(s): TROPONINI, PROBNP in the last 168 hours.  Glucose Recent Labs  Lab 03/06/17 1228 03/06/17 1615 03/06/17 2103 03/07/17 0013 03/07/17 0346 03/07/17 0852  GLUCAP 96 83 96 73 77 78    IMAGING/STUDIES: UDS 11/14: Negative  CONTINUOUS EEG 11/16: This was amoderatelyabnormal continuous video EEG due to loss of normal background, diffuse sharply contoured slowing(resolved), and a partial burst-suppression pattern. This  was indicative of an improvingglobal cerebral disturbance.No seizuresor definite epileptiform dischargeswere seen, however sharp activity was present early in the record. MRI BRAIN W/O 11/18: 1. Stable and without explanation for symptoms. 2. Mild periventricular white matter disease. 3. Sinusitis with ethmoid and sphenoid fluid levels, also seen on prior. Bilateral partial mastoid opacification. MRI C-SPINE W/O 11/18: 1. Prominent edema in the bilateral neck with retropharyngeal collection. The submandibular glands appears thickened and edematous and this may be submandibular sialadenitis (bilaterality suggesting viral/systemic process). Suppurative infection is not excluded. 2. No acute finding to explain left arm deficit. 3. Disc and facet degeneration as described. Spinal stenosis with mild cord flattening at C5-6. Foraminal narrowings noted above.  MICROBIOLOGY: MRSA PCR : Positive  Blood Cultures x2 11/14: Negative  Tracheal Aspirate Culture 11/14: MRSA Urine Streptococcal Antigen 11/14: Negative  Urine Legionella Antigen 11/14: Negative   Tracheal Aspirate Culture 11/22 >>> Urine Culture 11/22:  Negative  Blood Cultures x2 11/22 >>>  ANTIBIOTICS: Levaquin 11/14 - 11/15 Aztreonam 11/14 - 11/15 Cefepime 11/15 - 11/18 Vancomycin 11/14 - 11/23   SIGNIFICANT EVENTS: 11/14 - Admit 11/15 - Continuous EEG 11/20 - Improving mentation on Precedex. No cuff leak. Tolerating PS 5/5.  11/22 - Patient apneic on sedation w/ SBT. FUO >> cultures repeated. 11/23 - No cuff leak. Following commands and much more awake & cooperative. 11/24 - Still no cuff leak >> consulted ENT. Self Extubated w/ subsequent stridor >> tx w/ Decadron, Lasix, Epi & BiPAP   DISCUSSION: 66 year old with polysubstance abuse, schizophrenia admitted with altered mental status, hypothermia, hypothyroidism, adrenal insufficiency Acute hypoxic respiratory failure  ASSESSMENT / PLAN:  PULMONARY A: Acute hypoxic respiratory failure Retropharyngeal edema > resolved on CT neck 11/26 P:   Stable post extubation. ENT on board for evaluation of retropharyngeal edema. Plan on laryngoscopy 11/28 Finished decadon and lasix  RENAL A:   Stable P:   Replete K  GASTROINTESTINAL A:   No issues P:   Keep NPO for now. Swallow eval  INFECTIOUS A:   MRSA PNA P:   S/p vanco for 10 days  ENDOCRINE A:   Hypothyroidism  P:   Continue IV synthroid Follow TSH  NEUROLOGIC A:   Epilepsy Acute encephalopathy P:   Continue keppra and depakote. Mental status is improving.  Get OOB and mobilize  FAMILY  - Updates: Patient updated at bedside.  - Inter-disciplinary family meet or Palliative Care meeting due by:  Day 7  Stable for transfer to SDU.  Marshell Garfinkel MD [facility] Pager [phone] If no answer or after 3pm call: [phone] 03/07/2017, 10:50 AM

## 2017-03-07 NOTE — Progress Notes (Signed)
Called and spoke with [facility] regarding consult with [facility] and alarming IV. At this time Silva Bandy reported that she has resolved the issue and no further alarming from pump and is getting good blood return and line was flushing. Instructed to let us know if she has any further questions. VU. Fran Lowes, RN VAST

## 2017-03-08 DIAGNOSIS — R1310 Dysphagia, unspecified: Secondary | ICD-10-CM

## 2017-03-08 DIAGNOSIS — R410 Disorientation, unspecified: Secondary | ICD-10-CM

## 2017-03-08 DIAGNOSIS — G40001 Localization-related (focal) (partial) idiopathic epilepsy and epileptic syndromes with seizures of localized onset, not intractable, with status epilepticus: Secondary | ICD-10-CM

## 2017-03-08 DIAGNOSIS — E039 Hypothyroidism, unspecified: Secondary | ICD-10-CM

## 2017-03-08 LAB — BASIC METABOLIC PANEL
ANION GAP: 9 (ref 5–15)
BUN: 14 mg/dL (ref 6–20)
CHLORIDE: 99 mmol/L — AB (ref 101–111)
CO2: 29 mmol/L (ref 22–32)
Calcium: 8.5 mg/dL — ABNORMAL LOW (ref 8.9–10.3)
Creatinine, Ser: 0.63 mg/dL (ref 0.44–1.00)
Glucose, Bld: 105 mg/dL — ABNORMAL HIGH (ref 65–99)
POTASSIUM: 3.4 mmol/L — AB (ref 3.5–5.1)
SODIUM: 137 mmol/L (ref 135–145)

## 2017-03-08 LAB — GLUCOSE, CAPILLARY
GLUCOSE-CAPILLARY: 91 mg/dL (ref 65–99)
Glucose-Capillary: 94 mg/dL (ref 65–99)
Glucose-Capillary: 110 mg/dL — ABNORMAL HIGH (ref 65–99)
Glucose-Capillary: 84 mg/dL (ref 65–99)

## 2017-03-08 LAB — CBC
HCT: 25.7 % — ABNORMAL LOW (ref 36.0–46.0)
HEMOGLOBIN: 8.5 g/dL — AB (ref 12.0–15.0)
MCH: 32.8 pg (ref 26.0–34.0)
MCHC: 33.1 g/dL (ref 30.0–36.0)
MCV: 99.2 fL (ref 78.0–100.0)
PLATELETS: 338 10*3/uL (ref 150–400)
RBC: 2.59 MIL/uL — ABNORMAL LOW (ref 3.87–5.11)
RDW: 14.6 % (ref 11.5–15.5)
WBC: 10.6 10*3/uL — AB (ref 4.0–10.5)

## 2017-03-08 LAB — PHOSPHORUS: PHOSPHORUS: 2 mg/dL — AB (ref 2.5–4.6)

## 2017-03-08 LAB — MAGNESIUM: MAGNESIUM: 2.3 mg/dL (ref 1.7–2.4)

## 2017-03-08 NOTE — Progress Notes (Addendum)
PROGRESS NOTE    Dominique Acosta  QIW:[id number] DOB: Mar 21, 1951 DOA: 02/22/2017 PCP: Lorene Dy, MD   Brief Narrative:  66 y.o. WF PMHx Chronic Arm pain, Chronic pain, COPD (chronic obstructive pulmonary disease), CAD, Polysubstance abuse, Depression, Dystonia, Epilepsy, Schizophrenia, Psychosis, Seizures HLD, Hypothyroid, Insomnia, Leukemia , MI,.   Found unresponsive after she called EMS. Patient had agonal respirations and hyperthermia. Required endotracheal intubation for airway protection. Patient has not been on treatment for her hypothyroidism     Subjective: 11/28 A/O 2 (does not know when, why), negative SOB, negative CP, negative N/V, negative abdominal pain   Assessment & Plan:   Active Problems:   Acute encephalopathy   Acute respiratory failure with hypoxia (HCC)   Acute respiratory failure with hypoxia  -Retropharyngeal edema: Result CT neck 11/26 -Stable post extubation -ENT PLANS LARYNGOSCOPY 11/28 -Completed course of Decadron and Lasix   MRSA pneumonia  -Completed 10 day course antibiotics    Severe Hypothyroidism  -11/15 TSH= 51.3 -Synthroid IV 50 g daily  -Will need to follow electrolytes closely: Daily Mg, K, PO4   Epilepsy -Keppra 1000 mg BID -Valproate IV 550 mg TID  Acute encephalopathy -Most likely multifactorial acute respiratory failure, severe hypothyroidism, seizures? -EEG showed negative seizure activity however was abnormal see results below.  -Mental status is improving.  -Ambulate Q shift   Altered mental status -Unsure of patient's baseline  Dysphagia -11/28 failed swallow study -11/28 CorTrak tube placed -Consult to nutrition for enteral feeding placed    DVT prophylaxis: Subcutaneous heparin Code Status: Partial Family Communication: None Disposition Plan: TBD   Consultants:  [facility] M ENT Neurology    Procedures/Significant Events:  11/14 - Admit 11/14 UDS:  Negative  11/15 CONTINUOUS EEG :  moderately  abnormal continuous video EEG due to loss of normal background, diffuse sharply contoured slowing (resolved), and a partial burst-suppression pattern. This was indicative of an improving global cerebral disturbance. No seizures or definite epileptiform discharges were seen, however sharp activity was present early in the record.   11/18 MRI BRAIN W/O :-Stable and without explanation for symptoms.-Mild periventricular white matter disease.-Sinusitis with ethmoid and sphenoid fluid levels, also seen on prior. Bilateral partial mastoid opacification. 11/18 MRI C-SPINE W/O contrast:-Prominent edema in the bilateral neck with retropharyngeal collection.  -Submandibular glands appears thickened and edematous and this may be submandibular sialadenitis (bilaterality suggesting viral/systemic process). Suppurative infection is not excluded. -Negative acute findings to explain left arm deficit. - -Disc and facet degeneration as described. Spinal stenosis with mild cord flattening at C5-6. Foraminal narrowings noted above. 11/20 - Improving mentation on Precedex. No cuff leak. Tolerating PS 5/5.  11/22 - Patient apneic on sedation w/ SBT. FUO >> cultures repeated. 11/23 - No cuff leak. Following commands and much more awake & cooperative. 11/24 - Still no cuff leak >> consulted ENT. Self Extubated w/ subsequent stridor >> tx w/ Decadron, Lasix, Epi & BiPAP   I have personally reviewed and interpreted all radiology studies and my findings are as above.  VENTILATOR SETTINGS:    Cultures MRSA PCR :  Positive  Blood Cultures x2 11/14:  Negative  Tracheal Aspirate Culture 11/14:  MRSA Urine Streptococcal Antigen 11/14:  Negative  Urine Legionella Antigen 11/14:  Negative  Tracheal Aspirate Culture 11/22 >>> Urine Culture 11/22:  Negative  Blood Cultures x2 11/22 >>>   Antimicrobials: Anti-infectives (From admission, onward)   Start     Stop   02/24/17 1200  levofloxacin (LEVAQUIN) IVPB 750 mg   Status:  Discontinued     02/23/17 1452   02/24/17 0900  ceFEPIme (MAXIPIME) 2 g in dextrose 5 % 50 mL IVPB  Status:  Discontinued     02/26/17 1456   02/24/17 0500  vancomycin (VANCOCIN) IVPB 750 mg/150 ml premix     03/03/17 1704   02/23/17 2030  ceFEPIme (MAXIPIME) 2 g in dextrose 5 % 50 mL IVPB  Status:  Discontinued     02/24/17 0830   02/23/17 1230  vancomycin (VANCOCIN) 1,250 mg in sodium chloride 0.9 % 250 mL IVPB  Status:  Discontinued     02/23/17 1500   02/22/17 2300  aztreonam (AZACTAM) 1 g in dextrose 5 % 50 mL IVPB  Status:  Discontinued     02/23/17 1452   02/22/17 1500  aztreonam (AZACTAM) 2 g in dextrose 5 % 50 mL IVPB     02/22/17 1957   02/22/17 1130  vancomycin (VANCOCIN) 2,000 mg in sodium chloride 0.9 % 500 mL IVPB     02/22/17 1802   02/22/17 1100  levofloxacin (LEVAQUIN) IVPB 750 mg     02/22/17 1309   02/22/17 1100  aztreonam (AZACTAM) 2 g in dextrose 5 % 50 mL IVPB  Status:  Discontinued     02/22/17 1444   02/22/17 1100  vancomycin (VANCOCIN) IVPB 1000 mg/200 mL premix  Status:  Discontinued     02/22/17 1122      Devices    LINES / TUBES:  CorTrak 11/28>>    Continuous Infusions: . sodium chloride 10 mL/hr at 03/07/17 0543  . dextrose 50 mL/hr at 03/07/17 1643  . levETIRAcetam Stopped (03/07/17 2237)  . valproate sodium Stopped (03/08/17 0630)     Objective: Vitals:   03/08/17 0200 03/08/17 0248 03/08/17 0420 03/08/17 0801  BP: (!) 91/57 129/67 121/88 118/65  Pulse: 89 90 86 84  Resp: 17 (!) 22 15   Temp:  98.7 F (37.1 C) 98.5 F (36.9 C) 99 F (37.2 C)  TempSrc:  Oral Oral Oral  SpO2: 99% 92% 100% 100%  Weight:  181 lb 14.1 oz (82.5 kg)    Height:        Intake/Output Summary (Last 24 hours) at 03/08/2017 0856 Last data filed at 03/08/2017 0801 Gross per 24 hour  Intake 1540 ml  Output 765 ml  Net 775 ml   Filed Weights   03/06/17 0300 03/07/17 0300 03/08/17 0248  Weight: 178 lb 5.6 oz (80.9 kg) 177 lb 0.5 oz (80.3 kg)  181 lb 14.1 oz (82.5 kg)    Examination:  General: A/O 2 (does not know when, why),, No acute respiratory distress Neck:  Negative scars, masses, torticollis, positive lymphadenopathy, JVD Lungs: Clear to auscultation bilaterally without wheezes or crackles Cardiovascular: Regular rate and rhythm without murmur gallop or rub normal S1 and S2 Abdomen: negative abdominal pain, nondistended, positive soft, bowel sounds, no rebound, no ascites, no appreciable mass Extremities: No significant cyanosis, clubbing, or edema bilateral lower extremities Skin: Negative rashes, lesions, ulcers Psychiatric:  Difficult to assess secondary to patient's altered mental status Central nervous system:  Cranial nerves II through XII intact, tongue/uvula midline, all extremities muscle strength 5/5, sensation intact throughout, negative dysarthria, negative expressive aphasia, negative receptive aphasia.  .     Data Reviewed: Care during the described time interval was provided by me .  I have reviewed this patient's available data, including medical history, events of note, physical examination, and all test results as part of my evaluation.   CBC:  Recent Labs  Lab 03/02/17 0330 03/03/17 0415 03/04/17 0615  03/05/17 0418 03/05/17 1703 03/06/17 0417 03/07/17 0331 03/08/17 0530  WBC 7.0 9.3 9.8  --  10.1  --  8.9 11.8* 10.6*  NEUTROABS 4.6 6.8 6.7  --  7.7  --  5.1  --   --   HGB 9.4* 10.0* 9.2*   < > 9.1* 8.8* 8.8* 9.0* 8.5*  HCT 29.2* 31.6* 28.0*   < > 26.1* 26.4* 26.1* 27.8* 25.7*  MCV 103.9* 99.7 96.9  --  98.5  --  96.3 97.2 99.2  PLT 198 206 203  --  246  --  226 298 338   < > = values in this interval not displayed.   Basic Metabolic Panel: Recent Labs  Lab 03/04/17 0615 03/05/17 0418 03/05/17 0419 03/05/17 1703 03/06/17 0417 03/07/17 0331 03/08/17 0530  NA 135  --  135 135 138 138 137  K 3.5  --  3.1* 3.3* 3.1* 3.0* 3.4*  CL 88*  --  90* 92* 94* 97* 99*  CO2 37*  --  34* 33*  34* 30 29  GLUCOSE 129*  --  118* 109* 94 84 105*  BUN 23*  --  20 19 21* 18 14  CREATININE 0.67  --  0.74 0.70 0.73 0.64 0.63  CALCIUM 8.0*  --  8.2* 8.2* 8.2* 8.4* 8.5*  MG 2.1 2.1  --  2.3 2.3 2.4 2.3  PHOS 2.3*  --  2.7  --  3.2 2.6 2.0*   GFR: Estimated Creatinine Clearance: 66.5 mL/min (by C-G formula based on SCr of 0.63 mg/dL). Liver Function Tests: Recent Labs  Lab 03/02/17 1028 03/03/17 0415 03/04/17 0615 03/05/17 0419 03/06/17 0417  ALBUMIN 3.0* 3.0* 2.8* 3.3* 3.2*   No results for input(s): LIPASE, AMYLASE in the last 168 hours. No results for input(s): AMMONIA in the last 168 hours. Coagulation Profile: No results for input(s): INR, PROTIME in the last 168 hours. Cardiac Enzymes: No results for input(s): CKTOTAL, CKMB, CKMBINDEX, TROPONINI in the last 168 hours. BNP (last 3 results) No results for input(s): PROBNP in the last 8760 hours. HbA1C: No results for input(s): HGBA1C in the last 72 hours. CBG: Recent Labs  Lab 03/07/17 1207 03/07/17 1631 03/07/17 2039 03/07/17 2331 03/08/17 0418  GLUCAP 83 68 70 76 94   Lipid Profile: No results for input(s): CHOL, HDL, LDLCALC, TRIG, CHOLHDL, LDLDIRECT in the last 72 hours. Thyroid Function Tests: No results for input(s): TSH, T4TOTAL, FREET4, T3FREE, THYROIDAB in the last 72 hours. Anemia Panel: No results for input(s): VITAMINB12, FOLATE, FERRITIN, TIBC, IRON, RETICCTPCT in the last 72 hours. Urine analysis:    Component Value Date/Time   COLORURINE COLORLESS (A) 03/02/2017 1045   APPEARANCEUR CLEAR 03/02/2017 1045   LABSPEC 1.006 03/02/2017 1045   PHURINE 7.0 03/02/2017 1045   GLUCOSEU NEGATIVE 03/02/2017 1045   HGBUR NEGATIVE 03/02/2017 1045   HGBUR negative 09/16/2009 0954   BILIRUBINUR NEGATIVE 03/02/2017 1045   KETONESUR NEGATIVE 03/02/2017 1045   PROTEINUR NEGATIVE 03/02/2017 1045   UROBILINOGEN 0.2 09/05/2014 2126   NITRITE NEGATIVE 03/02/2017 1045   LEUKOCYTESUR NEGATIVE 03/02/2017 1045    Sepsis Labs: @LABRCNTIP (procalcitonin:4,lacticidven:4)  ) Recent Results (from the past 240 hour(s))  Culture, respiratory (NON-Expectorated)     Status: None   Collection Time: 03/02/17  8:49 AM  Result Value Ref Range Status   Specimen Description TRACHEAL ASPIRATE  Final   Special Requests Normal  Final   Gram Stain   Final  RARE WBC PRESENT, PREDOMINANTLY MONONUCLEAR RARE SQUAMOUS EPITHELIAL CELLS PRESENT RARE GRAM POSITIVE COCCI IN CHAINS    Culture FEW Consistent with normal respiratory flora.  Final   Report Status 03/04/2017 FINAL  Final  Culture, blood (routine x 2)     Status: None   Collection Time: 03/02/17 10:28 AM  Result Value Ref Range Status   Specimen Description BLOOD RIGHT HAND  Final   Special Requests   Final    BOTTLES DRAWN AEROBIC ONLY Blood Culture adequate volume   Culture NO GROWTH 5 DAYS  Final   Report Status 03/07/2017 FINAL  Final  Culture, blood (routine x 2)     Status: None   Collection Time: 03/02/17 10:31 AM  Result Value Ref Range Status   Specimen Description BLOOD RIGHT HAND  Final   Special Requests   Final    BOTTLES DRAWN AEROBIC ONLY Blood Culture adequate volume   Culture NO GROWTH 5 DAYS  Final   Report Status 03/07/2017 FINAL  Final  Culture, Urine     Status: None   Collection Time: 03/02/17 10:46 AM  Result Value Ref Range Status   Specimen Description URINE, CATHETERIZED  Final   Special Requests Normal  Final   Culture NO GROWTH  Final   Report Status 03/03/2017 FINAL  Final         Radiology Studies: Ct Soft Tissue Neck W Contrast  Result Date: 03/06/2017 CLINICAL DATA:  66 y/o  F; hoarseness after extubation. EXAM: CT NECK WITH CONTRAST TECHNIQUE: Multidetector CT imaging of the neck was performed using the standard protocol following the bolus administration of intravenous contrast. CONTRAST:  30mL ISOVUE-300 IOPAMIDOL (ISOVUE-300) INJECTION 61% COMPARISON:  02/26/2017 cervical MRI.  08/18/2016 CT cervical  spine. FINDINGS: Streak and motion artifact from right shoulder hardware partially obscures the lower neck from the level of hyoid to thoracic outlet. Pharynx and larynx: Aerodigestive tract is widely patent. The exophytic lesion identified. Retropharyngeal fluid collection has resolved. Salivary glands: No inflammation, mass, or stone. Thyroid: Normal. Lymph nodes: None enlarged or abnormal density. Vascular: Right IJ catheter with tip below the field of view extending into superior vena cava. Calcified plaque of bilateral carotid bifurcations with mild right proximal ICA stenosis. Retropharyngeal submucosal course of the proximal right ICA in the neck. Limited intracranial: Negative. Visualized orbits: Negative. Mastoids and visualized paranasal sinuses: Paranasal sinus mucosal thickening with fluid levels in left mastoid opacification likely related to recent intubation. Skeleton: Cervical spondylosis better characterized on prior cervical MRI with discogenic degenerative changes greatest at the C5-C7 levels and prominent upper cervical facet arthrosis. Upper chest: Negative. Other: None. IMPRESSION: 1. Resolution of retropharyngeal fluid collection. No mass or abscess identified. 2. Patent aerodigestive tract. Streak and motion artifact partially obscures the hypopharynx and larynx, however, there is no high-grade stricture. Mild rightward tracheal deviation. 3. Paranasal sinus mucosal thickening, fluid levels, left mastoid effusion likely due to recent intubation. Electronically Signed   By: Kristine Garbe M.D.   On: 03/06/2017 14:27        Scheduled Meds: . ARIPiprazole  20 mg Per Tube Daily  . aspirin  81 mg Per Tube Daily  . atorvastatin  20 mg Per Tube QHS  . baclofen  5 mg Per Tube BID  . chlorhexidine  15 mL Mouth Rinse BID  . Chlorhexidine Gluconate Cloth  6 each Topical Daily  . folic acid  1 mg Intravenous Daily  . heparin  5,000 Units Subcutaneous Q8H  . ipratropium-albuterol  3 mL Nebulization Q6H  . levothyroxine  50 mcg Intravenous Daily  . mouth rinse  15 mL Mouth Rinse q12n4p  . sennosides  5 mL Per Tube BID  . sodium chloride flush  10-40 mL Intracatheter Q12H  . thiamine injection  100 mg Intravenous Daily   Continuous Infusions: . sodium chloride 10 mL/hr at 03/07/17 0543  . dextrose 50 mL/hr at 03/07/17 1643  . levETIRAcetam Stopped (03/07/17 2237)  . valproate sodium Stopped (03/08/17 0630)     LOS: 14 days    Time spent: 40 minutes    Ysidro Ramsay, Geraldo Docker, MD [facility] Pager [phone]   If 7PM-7AM, please contact night-coverage [email or web address] Password [facility] 03/08/2017, 8:56 AM

## 2017-03-08 NOTE — Procedures (Signed)
Objective Swallowing Evaluation: Type of Study: FEES-Fiberoptic Endoscopic Evaluation of Swallow   Patient Details  Name: Dominique Acosta MRN: [id number] Date of Birth: 1950/05/12  Today's Date: 03/08/2017 Time: SLP Start Time (ACUTE ONLY): 1327 -SLP Stop Time (ACUTE ONLY): 8338  SLP Time Calculation (min) (ACUTE ONLY): 20 min   Past Medical History:  Past Medical History:  Diagnosis Date  . Arm pain   . Chronic pain   . COPD (chronic obstructive pulmonary disease) ([place])   . Coronary artery disease   . Depression   . Dystonia   . Epilepsy ([place])   . High cholesterol   . Humerus fracture   . Hypothyroid   . Insomnia   . Leukemia ([place])   . Myocardial infarct ([place])   . Psychosis ([place])   . Schizophrenia ([place])   . Seizure [facility])    Past Surgical History:  Past Surgical History:  Procedure Laterality Date  . EXTERNAL EAR SURGERY    . HUMERUS FRACTURE SURGERY    . SHOULDER SURGERY     HPI: Pt is a 66 y.o.female former smoker who was found unresponsive after she called EMS. Patient had agonal respirations and hyperthermia. She required endotracheal intubation for airway protection 11/14. ENT was consulted when she did not have a cuff leak and MRI showed retropharyngeal fluid collection (resolved on CT Neck 11/26). Pt self-extubated on 11/24. Known history of COPD, coronary disease, MI, depression, schizophrenia, psychosis, dystonia, seizure disorder, substance abuse, chronic pain, and hypothyroidism.    Subjective: pt drowsy, confused, perseverative    Assessment / Plan / Recommendation  CHL IP CLINICAL IMPRESSIONS 03/08/2017  Clinical Impression Pt demonstrates a moderate to severe oropharyngeal dysphagia following prolonged intubation. Direct visualization of larynx shows white excrescence at posterior subglottis, with a circular hole, somewhat unusual and difficult to describe. There is mild edema and tissue change on the bilateral vocal folds as well, likely all secondary to  contact with ET tube. Pt exhibits a mild delay in airway protection with liquids reaching pyriform sinuses prior to swallow initiation, allowing for trace pentration/aspiration before the swallow as well as during the swallow due to incomplete vocal fold seal with nectar thick liquids. Pt initially tolerated honey thick liquids well, but oral residuals spilled to pharynx post swallow and were  aspirated with inconsistent sensation. Pt could tolerate puddings and purees adequately, but would need improved nutritional support while larynx heals. Suggest short term alternate nutrition with snack of purees and pudding from floor stock for comfort. Will follow for readiness for reapeat testing.   SLP Visit Diagnosis Dysphagia, oropharyngeal phase (R13.12)  Attention and concentration deficit following --  Frontal lobe and executive function deficit following --  Impact on safety and function --      CHL IP TREATMENT RECOMMENDATION 03/08/2017  Treatment Recommendations Therapy as outlined in treatment plan below;F/U FEES in --- days (Comment)     Prognosis 03/08/2017  Prognosis for Safe Diet Advancement Good  Barriers to Reach Goals Cognitive deficits  Barriers/Prognosis Comment --    CHL IP DIET RECOMMENDATION 03/08/2017  SLP Diet Recommendations Pudding thick liquid  Liquid Administration via --  Medication Administration Via alternative means  Compensations --  Postural Changes --      CHL IP OTHER RECOMMENDATIONS 03/08/2017  Recommended Consults --  Oral Care Recommendations Oral care BID  Other Recommendations Have oral suction available      CHL IP FOLLOW UP RECOMMENDATIONS 03/08/2017  Follow up Recommendations Inpatient Rehab      [facility]  IP FREQUENCY AND DURATION 03/08/2017  Speech Therapy Frequency (ACUTE ONLY) min 2x/week  Treatment Duration 2 weeks           CHL IP ORAL PHASE 03/08/2017  Oral Phase Impaired  Oral - Pudding Teaspoon --  Oral - Pudding Cup --  Oral - Honey  Teaspoon --  Oral - Honey Cup Lingual/palatal residue  Oral - Nectar Teaspoon --  Oral - Nectar Cup Lingual/palatal residue  Oral - Nectar Straw --  Oral - Thin Teaspoon --  Oral - Thin Cup --  Oral - Thin Straw --  Oral - Puree WFL  Oral - Mech Soft Decreased bolus cohesion;Reduced posterior propulsion;Impaired mastication  Oral - Regular --  Oral - Multi-Consistency --  Oral - Pill --  Oral Phase - Comment --    CHL IP PHARYNGEAL PHASE 03/08/2017  Pharyngeal Phase Impaired  Pharyngeal- Pudding Teaspoon --  Pharyngeal --  Pharyngeal- Pudding Cup --  Pharyngeal --  Pharyngeal- Honey Teaspoon --  Pharyngeal --  Pharyngeal- Honey Cup Delayed swallow initiation-pyriform sinuses;Penetration/Apiration after swallow  Pharyngeal Material enters airway, passes BELOW cords without attempt by patient to eject out (silent aspiration);Material enters airway, passes BELOW cords then ejected out  Pharyngeal- Nectar Teaspoon --  Pharyngeal --  Pharyngeal- Nectar Cup Penetration/Aspiration during swallow;Penetration/Aspiration before swallow;Delayed swallow initiation-pyriform sinuses;Reduced airway/laryngeal closure  Pharyngeal --  Pharyngeal- Nectar Straw --  Pharyngeal --  Pharyngeal- Thin Teaspoon --  Pharyngeal --  Pharyngeal- Thin Cup --  Pharyngeal --  Pharyngeal- Thin Straw --  Pharyngeal --  Pharyngeal- Puree Delayed swallow initiation-vallecula  Pharyngeal --  Pharyngeal- Mechanical Soft Other (Comment)  Pharyngeal --  Pharyngeal- Regular --  Pharyngeal --  Pharyngeal- Multi-consistency --  Pharyngeal --  Pharyngeal- Pill --  Pharyngeal --  Pharyngeal Comment --     No flowsheet data found.  No flowsheet data found. Herbie Baltimore, [place] CCC-SLP [phone]   Lynann Beaver 03/08/2017, 2:40 PM

## 2017-03-08 NOTE — Progress Notes (Signed)
Cortrak Tube Team Note:  Consult received to place a Cortrak feeding tube.   A 10 F Cortrak tube was placed in the right nare and secured with a nasal bridle at 69 cm. Per the Cortrak monitor reading the tube tip is post pyloric.   No x-ray is required. RN may begin using tube.   If the tube becomes dislodged please keep the tube and contact the Cortrak team at [email or web address] (password [email or web address]) for replacement.  If after hours and replacement cannot be delayed, place a NG tube and confirm placement with an abdominal x-ray.    Koleen Distance MS, RD, LDN Pager #- [phone] After Hours Pager: [phone]

## 2017-03-08 NOTE — Progress Notes (Signed)
  Speech Language Pathology Treatment: Dysphagia  Patient Details Name: Shacoya Burkhammer MRN: [id number] DOB: July 11, 1950 Today's Date: 03/08/2017 Time: 1050-1105 SLP Time Calculation (min) (ACUTE ONLY): 15 min  Assessment / Plan / Recommendation Clinical Impression  Pt seen for trials of Po to determine readiness for objective testing of swallowing. Pt fully alert, following commands though still disoriented and confused. Pt is severely dysphonic. She is attentive to PO and swallow trigger with puree is timely and subjectively strong. Will proceed with FEES to assess capability to tolerate modified diet given potential for limited airway protection and decreased sensation of aspiration. Will plan for FEES around 1pm today.   HPI HPI: Pt is a 66 y.o.female former smoker who was found unresponsive after she called EMS. Patient had agonal respirations and hyperthermia. She required endotracheal intubation for airway protection 11/14. ENT was consulted when she did not have a cuff leak and MRI showed retropharyngeal fluid collection (resolved on CT Neck 11/26). Pt self-extubated on 11/24. Known history of COPD, coronary disease, MI, depression, schizophrenia, psychosis, dystonia, seizure disorder, substance abuse, chronic pain, and hypothyroidism.       SLP Plan  Other (Comment)(FEES)       Recommendations  Diet recommendations: NPO Liquids provided via: Teaspoon                Plan: Other (Comment)(FEES)       GO               Herbie Baltimore, MA CCC-SLP [phone]  Lynann Beaver 03/08/2017, 11:29 AM

## 2017-03-08 NOTE — Progress Notes (Signed)
Inpatient Rehabilitation  Per PT request, patient was screened by Salvator Seppala for appropriateness for an Inpatient Acute Rehab consult.  At this time we are recommending an Inpatient Rehab consult.  Text paged MD to notify; please order if you are agreeable.    Cheyane Ayon, M.A., CCC/SLP Admission Coordinator  [facility]  Cell [phone]  

## 2017-03-08 NOTE — Evaluation (Signed)
Physical Therapy Evaluation Patient Details Name: Dominique Acosta MRN: [id number] DOB: 11-09-1950 Today's Date: 03/08/2017   History of Present Illness  Pt is a 66 y.o. female former smoker who was found unresponsive after she called EMS. Patient had agonal respirations and hyperthermia. She required endotracheal intubation for airway protection 11/14. ENT was consulted when she did not have a cuff leak and MRI showed retropharyngeal fluid collection (resolved on CT Neck 11/26). Pt self-extubated on 11/24. Known history of COPD, coronary disease, MI, depression, schizophrenia, psychosis, dystonia, seizure disorder, substance abuse, chronic pain, and hypothyroidism.     Clinical Impression  Pt presents with decreased awareness, decreased problem solving, and an overall decrease in functional mobility secondary to above. Pt pleasantly confused throughout session and an unreliable historian regarding PLOF; states she lives with sister. Min guard for seated EOB activity. Able to take steps from bed to chair with modA and bilat HHA to maintain balance. Feel pt would benefit from CIR-level therapies to maximize functional mobility and independence if she will have the appropriate support at d/c. Will continue to follow acutely to address established goals.    Follow Up Recommendations CIR;Supervision/Assistance - 24 hour    Equipment Recommendations  Other (comment)(TBD)    Recommendations for Other Services OT consult     Precautions / Restrictions Precautions Precautions: Fall Restrictions Weight Bearing Restrictions: No      Mobility  Bed Mobility Overal bed mobility: Needs Assistance Bed Mobility: Supine to Sit     Supine to sit: HOB elevated;Min assist     General bed mobility comments: MinA to assist hips to EOB  Transfers Overall transfer level: Needs assistance Equipment used: 2 person hand held assist Transfers: Sit to/from Stand Sit to Stand: Mod assist         General  transfer comment: Able to stand x2 from bed and chair with modA and bilat HHA; poor eccentric control into sitting  Ambulation/Gait Ambulation/Gait assistance: Mod assist Ambulation Distance (Feet): 2 Feet Assistive device: 2 person hand held assist Gait Pattern/deviations: Step-to pattern;Leaning posteriorly Gait velocity: Decreased   General Gait Details: ModA to maintain balance while taking steps from bed to chair with bilat HHA; pt with bilat LE buckling requiring cues to maintain hip extension. Decreased awareness of balance with uncontrolled descent to chair, requiring modA to correct  Stairs            Wheelchair Mobility    Modified Rankin (Stroke Patients Only)       Balance Overall balance assessment: Needs assistance   Sitting balance-Leahy Scale: Good Sitting balance - Comments: Able to reach beyond BOS and tolerate external perturbations with no LOB while sitting at EOB     Standing balance-Leahy Scale: Poor                               Pertinent Vitals/Pain Pain Assessment: Faces Faces Pain Scale: No hurt    Home Living Family/patient expects to be discharged to:: Private residence Living Arrangements: Other relatives(Sister (?))               Additional Comments: Pt unreliable historian when asked specific questions regarding home set-up and support. Mentioned her sister multiple times and states she lives with pt    Prior Function           Comments: Pt poor historian with increased confusion; unsure PLOF     Hand Dominance  Extremity/Trunk Assessment   Upper Extremity Assessment Upper Extremity Assessment: Generalized weakness    Lower Extremity Assessment Lower Extremity Assessment: Generalized weakness       Communication   Communication: Expressive difficulties(Soft, hoarse voice)  Cognition Arousal/Alertness: Awake/alert Behavior During Therapy: WFL for tasks assessed/performed Overall Cognitive  Status: No family/caregiver present to determine baseline cognitive functioning Area of Impairment: Orientation;Attention;Memory;Following commands;Safety/judgement;Awareness;Problem solving                 Orientation Level: Disoriented to;Place;Time;Situation Current Attention Level: Sustained Memory: Decreased short-term memory Following Commands: Follows one step commands inconsistently Safety/Judgement: Decreased awareness of safety;Decreased awareness of deficits Awareness: Intellectual Problem Solving: Slow processing;Decreased initiation;Requires verbal cues General Comments: Upon entering room, pt holding up BUEs with soft mitts stating "I don't want to swim anymore"; fixated on not wanting to swim throughout entire session despite multiple attempts at reorientation. Pt occasionally stating she was underground, or that her and her sister live underground. Very pleasant with her confusion throughout even though she would say "I don't want to do it anymore... the doctor said I don't have to swim anymore". Followed one step commands for mobility ~75% of session      General Comments General comments (skin integrity, edema, etc.): SpO2 remained >90% on RA    Exercises     Assessment/Plan    PT Assessment Patient needs continued PT services  PT Problem List Decreased strength;Decreased activity tolerance;Decreased balance;Decreased mobility;Decreased cognition;Decreased knowledge of use of DME;Decreased safety awareness       PT Treatment Interventions DME instruction;Gait training;Stair training;Functional mobility training;Therapeutic activities;Therapeutic exercise;Balance training;Neuromuscular re-education;Patient/family education    PT Goals (Current goals can be found in the Care Plan section)       Frequency Min 3X/week   Barriers to discharge        Co-evaluation               AM-PAC PT "6 Clicks" Daily Activity  Outcome Measure Difficulty turning  over in bed (including adjusting bedclothes, sheets and blankets)?: None Difficulty moving from lying on back to sitting on the side of the bed? : Unable Difficulty sitting down on and standing up from a chair with arms (e.g., wheelchair, bedside commode, etc,.)?: Unable Help needed moving to and from a bed to chair (including a wheelchair)?: A Little Help needed walking in hospital room?: A Lot Help needed climbing 3-5 steps with a railing? : A Lot 6 Click Score: 13    End of Session Equipment Utilized During Treatment: Gait belt Activity Tolerance: Patient tolerated treatment well;Other (comment)(Limited by confusion) Patient left: in chair;with call bell/phone within reach;with chair alarm set Nurse Communication: Mobility status PT Visit Diagnosis: Other abnormalities of gait and mobility (R26.89);Muscle weakness (generalized) (M62.81)    Time: 0349-1791 PT Time Calculation (min) (ACUTE ONLY): 28 min   Charges:   PT Evaluation $PT Eval Moderate Complexity: 1 Mod PT Treatments $Therapeutic Activity: 8-22 mins   PT G Codes:       Mabeline Caras, PT, DPT Acute Rehab Services  Pager: [place] 03/08/2017, 12:40 PM

## 2017-03-09 DIAGNOSIS — R1312 Dysphagia, oropharyngeal phase: Secondary | ICD-10-CM

## 2017-03-09 DIAGNOSIS — G40009 Localization-related (focal) (partial) idiopathic epilepsy and epileptic syndromes with seizures of localized onset, not intractable, without status epilepticus: Secondary | ICD-10-CM

## 2017-03-09 LAB — GLUCOSE, CAPILLARY
GLUCOSE-CAPILLARY: 107 mg/dL — AB (ref 65–99)
GLUCOSE-CAPILLARY: 107 mg/dL — AB (ref 65–99)
Glucose-Capillary: 102 mg/dL — ABNORMAL HIGH (ref 65–99)
Glucose-Capillary: 116 mg/dL — ABNORMAL HIGH (ref 65–99)
Glucose-Capillary: 121 mg/dL — ABNORMAL HIGH (ref 65–99)
Glucose-Capillary: 95 mg/dL (ref 65–99)
Glucose-Capillary: 99 mg/dL (ref 65–99)

## 2017-03-09 LAB — BASIC METABOLIC PANEL
Anion gap: 8 (ref 5–15)
BUN: 8 mg/dL (ref 6–20)
CALCIUM: 8.1 mg/dL — AB (ref 8.9–10.3)
CO2: 31 mmol/L (ref 22–32)
CREATININE: 0.67 mg/dL (ref 0.44–1.00)
Chloride: 96 mmol/L — ABNORMAL LOW (ref 101–111)
GFR calc Af Amer: >60 mL/min (ref >60)
GLUCOSE: 102 mg/dL — AB (ref 65–99)
Potassium: 2.9 mmol/L — ABNORMAL LOW (ref 3.5–5.1)
SODIUM: 135 mmol/L (ref 135–145)

## 2017-03-09 LAB — CBC WITH DIFFERENTIAL/PLATELET
BASOS ABS: 0 10*3/uL (ref 0.0–0.1)
BASOS PCT: 0 %
EOS PCT: 2 %
Eosinophils Absolute: 0.2 10*3/uL (ref 0.0–0.7)
HCT: 25.8 % — ABNORMAL LOW (ref 36.0–46.0)
Hemoglobin: 8.3 g/dL — ABNORMAL LOW (ref 12.0–15.0)
Lymphocytes Relative: 28 %
Lymphs Abs: 3.2 10*3/uL (ref 0.7–4.0)
MCH: 31.7 pg (ref 26.0–34.0)
MCHC: 32.2 g/dL (ref 30.0–36.0)
MCV: 98.5 fL (ref 78.0–100.0)
MONO ABS: 1.2 10*3/uL — AB (ref 0.1–1.0)
MONOS PCT: 11 %
Neutro Abs: 6.7 10*3/uL (ref 1.7–7.7)
Neutrophils Relative %: 59 %
PLATELETS: 332 10*3/uL (ref 150–400)
RBC: 2.62 MIL/uL — ABNORMAL LOW (ref 3.87–5.11)
RDW: 14.9 % (ref 11.5–15.5)
WBC: 11.4 10*3/uL — ABNORMAL HIGH (ref 4.0–10.5)

## 2017-03-09 LAB — PHOSPHORUS
PHOSPHORUS: 2.2 mg/dL — AB (ref 2.5–4.6)
PHOSPHORUS: 2.4 mg/dL — AB (ref 2.5–4.6)

## 2017-03-09 LAB — MAGNESIUM
MAGNESIUM: 2.1 mg/dL (ref 1.7–2.4)
Magnesium: 2.2 mg/dL (ref 1.7–2.4)

## 2017-03-09 LAB — POTASSIUM: Potassium: 3.5 mmol/L (ref 3.5–5.1)

## 2017-03-09 MED ORDER — POTASSIUM PHOSPHATE MONOBASIC 500 MG PO TABS: 500.0000 mg | ORAL_TABLET | Freq: Two times a day (BID) | ORAL | Status: DC

## 2017-03-09 MED ORDER — POTASSIUM CHLORIDE 20 MEQ PO PACK
50.0000 meq | PACK | Freq: Once | ORAL | Status: AC
  Administered 2017-03-09: 50 meq
  Filled 2017-03-09: qty 3

## 2017-03-09 MED ORDER — K PHOS MONO-SOD PHOS DI & MONO 155-852-130 MG PO TABS
250.0000 mg | ORAL_TABLET | Freq: Three times a day (TID) | ORAL | Status: AC
  Administered 2017-03-09 – 2017-03-10 (×3): 250 mg
  Filled 2017-03-09 (×3): qty 1

## 2017-03-09 MED ORDER — JEVITY 1.2 CAL PO LIQD
1000.0000 mL | ORAL | Status: DC
  Administered 2017-03-09 – 2017-03-14 (×5): 1000 mL
  Filled 2017-03-09 (×9): qty 1000

## 2017-03-09 MED ORDER — IPRATROPIUM-ALBUTEROL 0.5-2.5 (3) MG/3ML IN SOLN
3.0000 mL | Freq: Two times a day (BID) | RESPIRATORY_TRACT | Status: DC
  Administered 2017-03-09 – 2017-03-17 (×14): 3 mL via RESPIRATORY_TRACT
  Filled 2017-03-09 (×14): qty 3

## 2017-03-09 MED ORDER — PRO-STAT SUGAR FREE PO LIQD
30.0000 mL | Freq: Two times a day (BID) | ORAL | Status: DC
  Administered 2017-03-09 – 2017-03-16 (×14): 30 mL
  Filled 2017-03-09 (×13): qty 30

## 2017-03-09 NOTE — Progress Notes (Signed)
Nutrition Follow-up  DOCUMENTATION CODES:   Obesity unspecified  INTERVENTION:   -Initiate Jevity 1.2 @ 25 ml/hr via cortrak tube and increase by 10 ml every 4 hours to goal rate of 45 ml/hr.   30 ml Prostat BID.    Tube feeding regimen provides 1496 kcal (100% of needs), 90 grams of protein, and 875 ml of H2O.   NUTRITION DIAGNOSIS:   Inadequate oral intake related to inability to eat as evidenced by NPO status.  Ongoing  GOAL:   Patient will meet greater than or equal to 90% of their needs  Progressing  MONITOR:   Labs, Diet advancement, Weight trends, TF tolerance, Skin, I & O's  REASON FOR ASSESSMENT:   Consult Enteral/tube feeding initiation and management  ASSESSMENT:   66 yo female with PMH of epilepsy, hypothyroidism, HLD, depression, CAD, leukemia, MI, COPD, schizophrenia, psychosis who was admitted on 11/14 with hypoxic acute respiratory failure, aspiration PNA, requiring intubation on admission.  11/24- self- extubated, TF off 11/28- s/p BSE, recommend continue NPO with nutrition via alternative means; cortrak tube placed (tip of tube post-pyloric)   Case discussed with RN; cortrak tube functioning well. She confirmed plans to start TF today.   Labs reviewed: K: 2.9, CBGS: 84-107.   Diet Order:  Diet NPO time specified  EDUCATION NEEDS:   No education needs have been identified at this time  Skin:  Skin Assessment: Reviewed RN Assessment  Last BM:  03/07/17  Height:   Ht Readings from Last 1 Encounters:  02/22/17 5\' 1"  (1.549 m)    Weight:   Wt Readings from Last 1 Encounters:  03/09/17 187 lb 6.3 oz (85 kg)    Ideal Body Weight:  47.7 kg  BMI:  Body mass index is 35.41 kg/m.  Estimated Nutritional Needs:   Kcal:  1400-1600  Protein:  85-95 gm  Fluid:  1.4-1.6 L    [place] Norberto A. Jimmye Norman, RD, LDN, CDE Pager: [phone] After hours Pager: [phone]

## 2017-03-09 NOTE — Evaluation (Signed)
Occupational Therapy Evaluation Patient Details Name: Dominique Acosta MRN: [id number] DOB: [birth year] Today's Date: 03/09/2017    History of Present Illness Pt is a 66 y.o. female former smoker who was found unresponsive after she called EMS. Patient had agonal respirations and hyperthermia. She required endotracheal intubation for airway protection 11/14. ENT was consulted when she did not have a cuff leak and MRI showed retropharyngeal fluid collection (resolved on CT Neck 11/26). Pt self-extubated on 11/24. Known history of COPD, coronary disease, MI, depression, schizophrenia, psychosis, dystonia, seizure disorder, substance abuse, chronic pain, and hypothyroidism.    Clinical Impression   Pt admitted with above. She demonstrates the below listed deficits and will benefit from continued OT to maximize safety and independence with BADLs.  Pt presents to OT with impaired cognition including deficits with orientation, problem solving, attention, safety awareness; decreased balance, and generalized weakness.  She currently requires max - total A for ADLs, and min - mod A for functional transfers.  Pt frequently spontaneously sits without warning when in standing.  She indicates she lived alone and was independent with ADLs.  Feel she would benefit from CIR to allow her to maximize safety and independence with ADLs.       Follow Up Recommendations  CIR;Supervision/Assistance - 24 hour    Equipment Recommendations  3 in 1 bedside commode    Recommendations for Other Services Rehab consult     Precautions / Restrictions Precautions Precautions: Fall      Mobility Bed Mobility Overal bed mobility: Needs Assistance Bed Mobility: Supine to Sit;Sit to Supine     Supine to sit: HOB elevated;Min assist Sit to supine: Min assist   General bed mobility comments: assist to move LEs on and off bed and to lift trunk   Transfers Overall transfer level: Needs assistance Equipment used: 1 person  hand held assist Transfers: Sit to/from Stand Sit to Stand: Min assist         General transfer comment: Pt spontaneously stood multiple times from the bed with min A to steady, however, she is highly impulsive, with increasing confusion requires mod A to maintain balance at times     Balance Overall balance assessment: Needs assistance Sitting-balance support: Feet supported Sitting balance-Leahy Scale: Fair Sitting balance - Comments: close supervision provided due to impulsivity and poor safety awareness    Standing balance support: Single extremity supported Standing balance-Leahy Scale: Poor Standing balance comment: min - mod A.  Pt spontaneously sits without warning                            ADL either performed or assessed with clinical judgement   ADL Overall ADL's : Needs assistance/impaired Eating/Feeding: Supervision/ safety;Bed level   Grooming: Wash/dry hands;Wash/dry face;Oral care;Brushing hair;Minimal assistance;Bed level   Upper Body Bathing: Maximal assistance;Bed level   Lower Body Bathing: Maximal assistance;Sit to/from stand   Upper Body Dressing : Maximal assistance;Sitting   Lower Body Dressing: Total assistance;Sit to/from stand   Toilet Transfer: Maximal assistance;Stand-pivot;BSC   Toileting- Clothing Manipulation and Hygiene: Maximal assistance;Sit to/from stand       Functional mobility during ADLs: Minimal assistance;Moderate assistance General ADL Comments: pt limited due to cognitive impairment and distractability.       Vision         Perception     Praxis      Pertinent Vitals/Pain Pain Assessment: Faces Faces Pain Scale: No hurt     Hand Dominance Right  Extremity/Trunk Assessment Upper Extremity Assessment Upper Extremity Assessment: Generalized weakness   Lower Extremity Assessment Lower Extremity Assessment: Defer to PT evaluation   Cervical / Trunk Assessment Cervical / Trunk Assessment: Normal    Communication Communication Communication: Expressive difficulties(difficult to understand )   Cognition Arousal/Alertness: Awake/alert Behavior During Therapy: Impulsive;Restless Overall Cognitive Status: Impaired/Different from baseline Area of Impairment: Orientation;Attention;Memory;Following commands;Safety/judgement;Problem solving                 Orientation Level: Disoriented to;Time;Situation Current Attention Level: Focused;Sustained Memory: Decreased short-term memory Following Commands: Follows one step commands inconsistently Safety/Judgement: Decreased awareness of safety;Decreased awareness of deficits   Problem Solving: Difficulty sequencing;Slow processing;Requires verbal cues;Requires tactile cues General Comments: Pt highly impulsive verbally perseverates.  She states "it's supposed to be March" when asked what year it is.   She is highly distractable - demonstrates focused attention with short periods of sustained    General Comments       Exercises     Shoulder Instructions      Home Living Family/patient expects to be discharged to:: Private residence Living Arrangements: Other relatives Available Help at Discharge: Other (Comment)(? sister ) Type of Home: Apartment Home Access: Elevator     Home Layout: One level               Home Equipment: Cane - single point   Additional Comments: Pt unreliable historian       Prior Functioning/Environment Level of Independence: Independent with assistive device(s)        Comments: Pt reports she lives alone.  She reports she doesn't drive, and doesn't have anyone to provide transportation.  When asked how she gets groceries, she said she doesn't and she doesn't eat         OT Problem List: Decreased strength;Decreased activity tolerance;Impaired balance (sitting and/or standing);Decreased cognition;Decreased safety awareness;Decreased knowledge of use of DME or AE      OT  Treatment/Interventions: Self-care/ADL training;Therapeutic exercise;DME and/or AE instruction;Therapeutic activities;Cognitive remediation/compensation;Patient/family education;Balance training    OT Goals(Current goals can be found in the care plan section) Acute Rehab OT Goals OT Goal Formulation: Patient unable to participate in goal setting Time For Goal Achievement: 03/23/17 Potential to Achieve Goals: Good ADL Goals Pt Will Perform Eating: with modified independence;sitting Pt Will Perform Grooming: with min guard assist;standing Pt Will Perform Upper Body Bathing: with supervision Pt Will Perform Lower Body Bathing: with min assist;sit to/from stand Pt Will Transfer to Toilet: with min assist;ambulating;regular height toilet;bedside commode;grab bars Pt Will Perform Toileting - Clothing Manipulation and hygiene: with min guard assist;sit to/from stand Additional ADL Goal #1: Pt will sustain attention to familiar ADL activity x 4 mins with min cues.  OT Frequency: Min 2X/week   Barriers to D/C: Decreased caregiver support  unsure available support at home        Co-evaluation              AM-PAC PT "6 Clicks" Daily Activity     Outcome Measure Help from another person eating meals?: A Little Help from another person taking care of personal grooming?: A Little Help from another person toileting, which includes using toliet, bedpan, or urinal?: A Lot Help from another person bathing (including washing, rinsing, drying)?: A Lot Help from another person to put on and taking off regular upper body clothing?: A Lot Help from another person to put on and taking off regular lower body clothing?: A Lot 6 Click Score: 14   End of  Session Nurse Communication: Mobility status  Activity Tolerance: Other (comment)(cognition ) Patient left: in bed;with call bell/phone within reach;with bed alarm set  OT Visit Diagnosis: Unsteadiness on feet (R26.81);Cognitive communication deficit  (R41.841)                Time: 1415-1440 OT Time Calculation (min): 25 min Charges:  OT General Charges $OT Visit: 1 Visit OT Evaluation $OT Eval Moderate Complexity: 1 Mod OT Treatments $Self Care/Home Management : 8-22 mins G-Codes:     [facility], OTR/L [phone]   Lucille Passy M 03/09/2017, 4:55 PM

## 2017-03-09 NOTE — Consult Note (Signed)
Physical Medicine and Rehabilitation Consult Reason for Consult: Decreased functional mobility Referring Physician: [facility]   HPI: Dominique Acosta is a 66 y.o. right handed female with history of COPD/tobacco abuse, CAD, schizophrenia, seizure disorder. Per chart review patient lives with sister. Presented 02/22/2017 after being found unresponsive requiring intubation to protect her airway. She was hypokalemic 2.7 creatinine 1.68 from baseline 1.26, CK 174, troponin negative, alcohol and urine drug screen negative. CT/MRI the brain negative for acute changes. EEG with mild generalized slowing of the brain nonspecific no seizure and maintained on Keppra for seizure prophylaxis.. Patient self extubated 03/04/2017. Currently with Cortrak secondary to dysphagia for nutritional support. MRSA pneumonia maintained on contact precautions. Acute encephalopathy felt to be multi-factorial. Subcutaneous heparin for DVT prophylaxis. Ongoing bouts of hypokalemia with supplement added. Physical therapy evaluation completed 03/08/2017 with recommendations of physical medicine rehabilitation consult.   Review of Systems  Unable to perform ROS: Acuity of condition   Past Medical History:  Diagnosis Date  . Arm pain   . Chronic pain   . COPD (chronic obstructive pulmonary disease) ([place])   . Coronary artery disease   . Depression   . Dystonia   . Epilepsy ([place])   . High cholesterol   . Humerus fracture   . Hypothyroid   . Insomnia   . Leukemia ([place])   . Myocardial infarct ([place])   . Psychosis ([place])   . Schizophrenia ([place])   . Seizure [facility])    Past Surgical History:  Procedure Laterality Date  . EXTERNAL EAR SURGERY    . HUMERUS FRACTURE SURGERY    . SHOULDER SURGERY     Family History  Problem Relation Age of Onset  . Cancer Mother   . Cancer Father    Social History:  reports that she has quit smoking. Her smoking use included cigarettes. She has a 82.00 pack-year smoking history. she has  never used smokeless tobacco. She reports that she does not drink alcohol or use drugs. Allergies:  Allergies  Allergen Reactions  . Amoxicillin Anaphylaxis and Other (See Comments)    Childhood allergy >> tolerated cephalosporins  Has patient had a PCN reaction causing immediate rash, facial/tongue/throat swelling, SOB or lightheadedness with hypotension: Yes Has patient had a PCN reaction causing severe rash involving mucus membranes or skin necrosis: No Has patient had a PCN reaction that required hospitalization No Has patient had a PCN reaction occurring within the last 10 years: No If all of the above answers are "NO", then may proceed with Cephalosporin use.  Marland Kitchen Penicillins Anaphylaxis and Other (See Comments)    Childhood allergy Has patient had a PCN reaction causing immediate rash, facial/tongue/throat swelling, SOB or lightheadedness with hypotension: Yes Has patient had a PCN reaction causing severe rash involving mucus membranes or skin necrosis: No Has patient had a PCN reaction that required hospitalization No Has patient had a PCN reaction occurring within the last 10 years: No If all of the above answers are "NO", then may proceed with Cephalosporin use.   Marland Kitchen Phenytoin Other (See Comments)    Reaction:  CNS disorder   . Keflex [Cephalexin] Diarrhea  . Dilaudid [Hydromorphone Hcl] Other (See Comments)    Reaction:  Psychosis   . Haldol [Haloperidol] Other (See Comments)    Reaction:  Psychosis   . Morphine And Related Other (See Comments)    Reaction:  Psychosis    Medications Prior to Admission  Medication Sig Dispense Refill  . ADVAIR DISKUS 250-50  MCG/DOSE AEPB Take 1 puff by mouth daily.    . baclofen (LIORESAL) 10 MG tablet Take 10 mg by mouth 3 (three) times daily as needed for muscle spasms.    Marland Kitchen gabapentin (NEURONTIN) 800 MG tablet Take 800 mg 3 (three) times daily by mouth.     Marland Kitchen LORazepam (ATIVAN) 1 MG tablet Take 1 tablet (1 mg total) by mouth 2 (two) times  daily as needed for anxiety. 10 tablet 0  . PROAIR HFA 108 (90 Base) MCG/ACT inhaler Inhale 1-2 puffs every 4 (four) hours as needed into the lungs for shortness of breath.    . temazepam (RESTORIL) 30 MG capsule Take 30 mg by mouth at bedtime as needed for sleep.    Marland Kitchen atorvastatin (LIPITOR) 20 MG tablet Take 20 mg by mouth at bedtime. Take one tablet daily for hyperlipidemia     . benztropine (COGENTIN) 2 MG tablet Take 1 tablet (2 mg total) by mouth 3 (three) times daily. 90 tablet 0  . ondansetron (ZOFRAN) 8 MG tablet Take 1 tablet (8 mg total) by mouth every 8 (eight) hours as needed for nausea or vomiting. 12 tablet 0  . venlafaxine XR (EFFEXOR-XR) 150 MG 24 hr capsule Take 300 mg by mouth daily with breakfast.    . [DISCONTINUED] ARIPiprazole (ABILIFY) 20 MG tablet Take 1 tablet (20 mg total) by mouth daily. (Patient not taking: Reported on 02/22/2017) 15 tablet 0    Home: Home Living Family/patient expects to be discharged to:: Private residence Living Arrangements: Other relatives(Sister (?)) Additional Comments: Pt unreliable historian when asked specific questions regarding home set-up and support. Mentioned her sister multiple times and states she lives with pt  Functional History: Prior Function Comments: Pt poor historian with increased confusion; unsure PLOF Functional Status:  Mobility: Bed Mobility Overal bed mobility: Needs Assistance Bed Mobility: Supine to Sit Supine to sit: HOB elevated, Min assist General bed mobility comments: MinA to assist hips to EOB Transfers Overall transfer level: Needs assistance Equipment used: 2 person hand held assist Transfers: Sit to/from Stand Sit to Stand: Mod assist General transfer comment: Able to stand x2 from bed and chair with modA and bilat HHA; poor eccentric control into sitting Ambulation/Gait Ambulation/Gait assistance: Mod assist Ambulation Distance (Feet): 2 Feet Assistive device: 2 person hand held assist Gait  Pattern/deviations: Step-to pattern, Leaning posteriorly General Gait Details: ModA to maintain balance while taking steps from bed to chair with bilat HHA; pt with bilat LE buckling requiring cues to maintain hip extension. Decreased awareness of balance with uncontrolled descent to chair, requiring modA to correct Gait velocity: Decreased    ADL:    Cognition: Cognition Overall Cognitive Status: No family/caregiver present to determine baseline cognitive functioning Orientation Level: Oriented to person, Oriented to place, Disoriented to time, Disoriented to situation Cognition Arousal/Alertness: Awake/alert Behavior During Therapy: WFL for tasks assessed/performed Overall Cognitive Status: No family/caregiver present to determine baseline cognitive functioning Area of Impairment: Orientation, Attention, Memory, Following commands, Safety/judgement, Awareness, Problem solving Orientation Level: Disoriented to, Place, Time, Situation Current Attention Level: Sustained Memory: Decreased short-term memory Following Commands: Follows one step commands inconsistently Safety/Judgement: Decreased awareness of safety, Decreased awareness of deficits Awareness: Intellectual Problem Solving: Slow processing, Decreased initiation, Requires verbal cues General Comments: Upon entering room, pt holding up BUEs with soft mitts stating "I don't want to swim anymore"; fixated on not wanting to swim throughout entire session despite multiple attempts at reorientation. Pt occasionally stating she was underground, or that her and her  sister live underground. Very pleasant with her confusion throughout even though she would say "I don't want to do it anymore... the doctor said I don't have to swim anymore". Followed one step commands for mobility ~75% of session  Blood pressure (!) 123/54, pulse 81, temperature 98.7 F (37.1 C), temperature source Oral, resp. rate (!) 27, height 5\' 1"  (1.549 m), weight 85 kg  (187 lb 6.3 oz), SpO2 91 %. Physical Exam  HENT:  Head: Normocephalic.  Nasogastric tube in place  Eyes: EOM are normal.  Neck: Normal range of motion. Neck supple. No thyromegaly present.  Cardiovascular: Normal rate and regular rhythm.  Respiratory:  Decreased breath sounds at the bases but limited inspiratory effort  GI: Soft. Bowel sounds are normal. She exhibits no distension.  Neurological: She is alert.    Followed simple commands. She is a very poor historian.  Voice dysarthric.  Impulsive told me that she lives in [place] and knew that she was in [facility].  Stated that she was from [place].  Upper extremity strength grossly 4 out of 5 proximal distal lower extremity strength 2+ out of 5 hip flexors 2+ to 3 out of 5 knee extensors and 4 out of 5 ankle dorsiflexors and plantar flexors.  Sensory exam intact.  Skin: Skin is warm and dry.    Results for orders placed or performed during the hospital encounter of 02/22/17 (from the past 24 hour(s))  Glucose, capillary     Status: Abnormal   Collection Time: 03/08/17 11:47 AM  Result Value Ref Range   Glucose-Capillary 110 (H) 65 - 99 mg/dL  Glucose, capillary     Status: None   Collection Time: 03/08/17  5:19 PM  Result Value Ref Range   Glucose-Capillary 91 65 - 99 mg/dL   Comment 1 Notify RN    Comment 2 Document in Chart   Glucose, capillary     Status: None   Collection Time: 03/08/17  9:45 PM  Result Value Ref Range   Glucose-Capillary 84 65 - 99 mg/dL   Comment 1 Notify RN    Comment 2 Document in Chart   Glucose, capillary     Status: None   Collection Time: 03/09/17 12:08 AM  Result Value Ref Range   Glucose-Capillary 99 65 - 99 mg/dL   Comment 1 Notify RN    Comment 2 Document in Chart   Glucose, capillary     Status: Abnormal   Collection Time: 03/09/17  4:26 AM  Result Value Ref Range   Glucose-Capillary 102 (H) 65 - 99 mg/dL   Comment 1 Notify RN    Comment 2 Document in Chart   Basic  metabolic panel     Status: Abnormal   Collection Time: 03/09/17  5:00 AM  Result Value Ref Range   Sodium 135 135 - 145 mmol/L   Potassium 2.9 (L) 3.5 - 5.1 mmol/L   Chloride 96 (L) 101 - 111 mmol/L   CO2 31 22 - 32 mmol/L   Glucose, Bld 102 (H) 65 - 99 mg/dL   BUN 8 6 - 20 mg/dL   Creatinine, Ser 0.67 0.44 - 1.00 mg/dL   Calcium 8.1 (L) 8.9 - 10.3 mg/dL   GFR calc non Af Amer >60 >60 mL/min   GFR calc Af Amer >60 >60 mL/min   Anion gap 8 5 - 15  Magnesium     Status: None   Collection Time: 03/09/17  5:00 AM  Result Value Ref Range  Magnesium 2.2 1.7 - 2.4 mg/dL  CBC with Differential/Platelet     Status: Abnormal   Collection Time: 03/09/17  5:00 AM  Result Value Ref Range   WBC 11.4 (H) 4.0 - 10.5 K/uL   RBC 2.62 (L) 3.87 - 5.11 MIL/uL   Hemoglobin 8.3 (L) 12.0 - 15.0 g/dL   HCT 25.8 (L) 36.0 - 46.0 %   MCV 98.5 78.0 - 100.0 fL   MCH 31.7 26.0 - 34.0 pg   MCHC 32.2 30.0 - 36.0 g/dL   RDW 14.9 11.5 - 15.5 %   Platelets 332 150 - 400 K/uL   Neutrophils Relative % 59 %   Neutro Abs 6.7 1.7 - 7.7 K/uL   Lymphocytes Relative 28 %   Lymphs Abs 3.2 0.7 - 4.0 K/uL   Monocytes Relative 11 %   Monocytes Absolute 1.2 (H) 0.1 - 1.0 K/uL   Eosinophils Relative 2 %   Eosinophils Absolute 0.2 0.0 - 0.7 K/uL   Basophils Relative 0 %   Basophils Absolute 0.0 0.0 - 0.1 K/uL  Phosphorus     Status: Abnormal   Collection Time: 03/09/17  5:00 AM  Result Value Ref Range   Phosphorus 2.2 (L) 2.5 - 4.6 mg/dL  Glucose, capillary     Status: Abnormal   Collection Time: 03/09/17  8:06 AM  Result Value Ref Range   Glucose-Capillary 107 (H) 65 - 99 mg/dL   Comment 1 Notify RN    Comment 2 Document in Chart    No results found.  Assessment/Plan: Diagnosis: Metabolic encephalopathy 1. Does the need for close, 24 hr/day medical supervision in concert with the patient's rehab needs make it unreasonable for this patient to be served in a less intensive setting? Yes 2. Co-Morbidities  requiring supervision/potential complications: Hypokalemia, dysphagia, history of schizoaffective disorder 3. Due to bladder management, bowel management, safety, skin/wound care, disease management, medication administration and patient education, does the patient require 24 hr/day rehab nursing? Yes 4. Does the patient require coordinated care of a physician, rehab nurse, PT (1-2 hrs/day, 5 days/week), OT (1-2 hrs/day, 5 days/week) and SLP (1-2 hrs/day, 5 days/week) to address physical and functional deficits in the context of the above medical diagnosis(es)? Yes Addressing deficits in the following areas: balance, endurance, locomotion, strength, transferring, bowel/bladder control, bathing, dressing, feeding, grooming, toileting, cognition and psychosocial support 5. Can the patient actively participate in an intensive therapy program of at least 3 hrs of therapy per day at least 5 days per week? Yes 6. The potential for patient to make measurable gains while on inpatient rehab is excellent 7. Anticipated functional outcomes upon discharge from inpatient rehab are supervision  with PT, supervision with OT, supervision with SLP. 8. Estimated rehab length of stay to reach the above functional goals is: 9-15 days 9. Anticipated D/C setting: Home 10. Anticipated post D/C treatments: [place] therapy 11. Overall Rehab/Functional Prognosis: excellent  RECOMMENDATIONS: This patient's condition is appropriate for continued rehabilitative care in the following setting: CIR Patient has agreed to participate in recommended program. Yes Note that insurance prior authorization may be required for reimbursement for recommended care.  Comment: Rehab Admissions Coordinator to follow up.  Thanks,  Meredith Staggers, MD, Mellody Drown    Lavon Paganini Orion, PA-C 03/09/2017

## 2017-03-09 NOTE — Progress Notes (Addendum)
Objective Swallowing Evaluation: Type of Study: FEES-Fiberoptic Endoscopic Evaluation of Swallow   Patient Details  Name: Dominique Acosta MRN: [id number] Date of Birth: 04/28/1950  Today's Date: 03/09/2017 Time: SLP Start Time (ACUTE ONLY): 1327 -SLP Stop Time (ACUTE ONLY): 9622  SLP Time Calculation (min) (ACUTE ONLY): 20 min   Past Medical History:  Past Medical History:  Diagnosis Date  . Arm pain   . Chronic pain   . COPD (chronic obstructive pulmonary disease) ([place])   . Coronary artery disease   . Depression   . Dystonia   . Epilepsy ([place])   . High cholesterol   . Humerus fracture   . Hypothyroid   . Insomnia   . Leukemia ([place])   . Myocardial infarct ([place])   . Psychosis ([place])   . Schizophrenia ([place])   . Seizure [facility])    Past Surgical History:  Past Surgical History:  Procedure Laterality Date  . EXTERNAL EAR SURGERY    . HUMERUS FRACTURE SURGERY    . SHOULDER SURGERY     HPI: Pt is a 66 y.o.female former smoker who was found unresponsive after she called EMS. Patient had agonal respirations and hyperthermia. She required endotracheal intubation for airway protection 11/14. ENT was consulted when she did not have a cuff leak and MRI showed retropharyngeal fluid collection (resolved on CT Neck 11/26). Pt self-extubated on 11/24. Known history of COPD, coronary disease, MI, depression, schizophrenia, psychosis, dystonia, seizure disorder, substance abuse, chronic pain, and hypothyroidism.    Subjective: pt drowsy, confused, perseverative    Assessment / Plan / Recommendation  CHL IP CLINICAL IMPRESSIONS 03/08/2017  Clinical Impression Pt demonstrates a moderate to severe oropharyngeal dysphagia following porlonged intubation. Direct visualization of larynx shows white excrescence at posterior subglottis, with a circular hole, somewhat unusual and difficult to describe. There is mild edema dn tissue change on the bilateral vocal fods as well, likely all secondary to  contact with ET tube. Pt exhibits a mild delay in airway protection with liquids reaching pyriform sinuses priro tswallow initiation, allowing for trace pentration/aspriation before the swallow as well as during the swallow due to incomplete vocal fold seal with nectar thick liquids. Pt initially tolerated honey thick liquids well, but oral residuals spilled to pharynx post swallow and were consistently aspirated with inconsistnet sensation. Pt could toelrate puddings and purees adequately, but would need improved nutritional support while larynx heals. Suggest short term alternate nutrtion with snack of pureed and pudding from floor stock for comfort. Will follow for readiness for reapeat testing.   SLP Visit Diagnosis Dysphagia, oropharyngeal phase (R13.12)  Attention and concentration deficit following --  Frontal lobe and executive function deficit following --  Impact on safety and function --      CHL IP TREATMENT RECOMMENDATION 03/08/2017  Treatment Recommendations Therapy as outlined in treatment plan below;F/U FEES in --- days (Comment)     Prognosis 03/08/2017  Prognosis for Safe Diet Advancement Good  Barriers to Reach Goals Cognitive deficits  Barriers/Prognosis Comment --    CHL IP DIET RECOMMENDATION 03/08/2017  SLP Diet Recommendations Pudding thick liquid  Liquid Administration via --  Medication Administration Via alternative means  Compensations --  Postural Changes --      CHL IP OTHER RECOMMENDATIONS 03/08/2017  Recommended Consults --  Oral Care Recommendations Oral care BID  Other Recommendations Have oral suction available      CHL IP FOLLOW UP RECOMMENDATIONS 03/08/2017  Follow up Recommendations Inpatient Rehab      CHL IP  FREQUENCY AND DURATION 03/08/2017  Speech Therapy Frequency (ACUTE ONLY) min 2x/week  Treatment Duration 2 weeks           CHL IP ORAL PHASE 03/08/2017  Oral Phase Impaired  Oral - Pudding Teaspoon --  Oral - Pudding Cup --   Oral - Honey Teaspoon --  Oral - Honey Cup Lingual/palatal residue  Oral - Nectar Teaspoon --  Oral - Nectar Cup Lingual/palatal residue  Oral - Nectar Straw --  Oral - Thin Teaspoon --  Oral - Thin Cup --  Oral - Thin Straw --  Oral - Puree WFL  Oral - Mech Soft Decreased bolus cohesion;Reduced posterior propulsion;Impaired mastication  Oral - Regular --  Oral - Multi-Consistency --  Oral - Pill --  Oral Phase - Comment --    CHL IP PHARYNGEAL PHASE 03/08/2017  Pharyngeal Phase Impaired  Pharyngeal- Pudding Teaspoon --  Pharyngeal --  Pharyngeal- Pudding Cup --  Pharyngeal --  Pharyngeal- Honey Teaspoon --  Pharyngeal --  Pharyngeal- Honey Cup Delayed swallow initiation-pyriform sinuses;Penetration/Apiration after swallow  Pharyngeal Material enters airway, passes BELOW cords without attempt by patient to eject out (silent aspiration);Material enters airway, passes BELOW cords then ejected out  Pharyngeal- Nectar Teaspoon --  Pharyngeal --  Pharyngeal- Nectar Cup Penetration/Aspiration during swallow;Penetration/Aspiration before swallow;Delayed swallow initiation-pyriform sinuses;Reduced airway/laryngeal closure  Pharyngeal --  Pharyngeal- Nectar Straw --  Pharyngeal --  Pharyngeal- Thin Teaspoon --  Pharyngeal --  Pharyngeal- Thin Cup --  Pharyngeal --  Pharyngeal- Thin Straw --  Pharyngeal --  Pharyngeal- Puree Delayed swallow initiation-vallecula  Pharyngeal --  Pharyngeal- Mechanical Soft Other (Comment)  Pharyngeal --  Pharyngeal- Regular --  Pharyngeal --  Pharyngeal- Multi-consistency --  Pharyngeal --  Pharyngeal- Pill --  Pharyngeal --  Pharyngeal Comment --     No flowsheet data found.  No flowsheet data found.  Houston Siren 03/09/2017, 11:37 AM  Herbie Baltimore M.Ed CCC-SLP performed FEES

## 2017-03-09 NOTE — Progress Notes (Signed)
PROGRESS NOTE    Dominique Acosta  GBT:[id number] DOB: Apr 10, 1951 DOA: 02/22/2017 PCP: Lorene Dy, MD   Brief Narrative:  66 y.o. WF PMHx Chronic Arm pain, Chronic pain, COPD (chronic obstructive pulmonary disease), CAD, Polysubstance abuse, Depression, Dystonia, Epilepsy, Schizophrenia, Psychosis, Seizures HLD, Hypothyroid, Insomnia, Leukemia , MI,.   Found unresponsive after she called EMS. Patient had agonal respirations and hyperthermia. Required endotracheal intubation for airway protection. Patient has not been on treatment for her hypothyroidism     Subjective: 11/29 A/O 2, (does not know when, why) (, negative SOB, negative CP, negative N/V, negative abdominal pain. Follows commands. Pleasantly confused      Assessment & Plan:   Active Problems:   Acute encephalopathy   Acute respiratory failure with hypoxia (HCC)   Acute respiratory failure with hypoxia  -Retropharyngeal edema appears to have resolved per CT neck 11/26 results below. -Stable postextubation -ENT RESCHEDULED LARYNGOSCOPY for 11/30   -Completed course of Decadron and Lasix    MRSA pneumonia  -Completed 10 day course antibiotics    Severe Hypothyroidism  -11/15 TSH= 51.3 -Synthroid IV 50 g daily  -Follow electrolytes minimal daily:  Mg, K, PO4. Given patient's severe hypo-thyroidism as her ATP ramps up will significantly deplete these electrolytes   Epilepsy -Keppra 1000 mg BID -Valproate IV 550 mg TID   Acute encephalopathy -Most likely multifactorial acute respiratory failure, severe hypothyroidism, seizures? -EEG showed negative seizure activity however was abnormal see results below.  -Patient remains confused unsure if this is her baseline .  -Ambulate Q shift    Altered mental status -Unsure of patient's baseline   Dysphagia -11/28 failed swallow study -11/28 CorTrak tube placed -Consult to nutrition for enteral feeding placed  Hypokalemia -Potassium  50 mEq -Recheck K/Mg/PO4  @1500 : Patient still requires additional potassium and phosphorus supplementation. K-Phos 500 mg 2 doses  Hypophosphatemia -K-Phos Neutral 250 mg 3 doses  Goals of care -Patient has not been out of bed since admission. Patient will require PEG tube placement if her dysphagia does not significantly improve over the next several days. -11/29 place consult to inpatient rehabilitation for evaluation for CIR (patient has not been out of bed unsure she will meet criteria)      DVT prophylaxis: Subcutaneous heparin Code Status: Partial Family Communication: None Disposition Plan: TBD   Consultants:  [facility] M ENT Neurology    Procedures/Significant Events:  11/14 - Admit 11/14 UDS:  Negative  11/15 CONTINUOUS EEG :  moderately abnormal continuous video EEG due to loss of normal background, diffuse sharply contoured slowing (resolved), and a partial burst-suppression pattern. This was indicative of an improving global cerebral disturbance. No seizures or definite epileptiform discharges were seen, however sharp activity was present early in the record.   11/18 MRI BRAIN W/O :-Stable and without explanation for symptoms.-Mild periventricular white matter disease.-Sinusitis with ethmoid and sphenoid fluid levels, also seen on prior. Bilateral partial mastoid opacification. 11/18 MRI C-SPINE W/O contrast:-Prominent edema in the bilateral neck with retropharyngeal collection.  -Submandibular glands appears thickened and edematous and this may be submandibular sialadenitis (bilaterality suggesting viral/systemic process). Suppurative infection is not excluded. -Negative acute findings to explain left arm deficit. - -Disc and facet degeneration as described. Spinal stenosis with mild cord flattening at C5-6. Foraminal narrowings noted above. 11/20 - Improving mentation on Precedex. No cuff leak. Tolerating PS 5/5.  11/22 - Patient apneic on sedation w/ SBT. FUO >> cultures repeated. 11/23 - No  cuff leak. Following commands and much more awake & cooperative.  11/24 - Still no cuff leak >> consulted ENT. Self Extubated w/ subsequent stridor >> tx w/ Decadron, Lasix, Epi & BiPAP 11/26 CT soft tissue neck W contrast:-Resolution of retropharyngeal fluid collection. No mass or abscess identified. -Negative high-grade stricture. Mild rightward tracheal deviation. -Paranasal sinus mucosal thickening, fluid levels, left mastoid effusion likely due to recent intubation.      I have personally reviewed and interpreted all radiology studies and my findings are as above.  VENTILATOR SETTINGS:    Cultures MRSA PCR :  Positive  Blood Cultures x2 11/14:  Negative  Tracheal Aspirate Culture 11/14:  MRSA Urine Streptococcal Antigen 11/14:  Negative  Urine Legionella Antigen 11/14:  Negative  Tracheal Aspirate Culture 11/22 >>> Urine Culture 11/22:  Negative  Blood Cultures x2 11/22 >>>   Antimicrobials: Anti-infectives (From admission, onward)   Start     Stop   02/24/17 1200  levofloxacin (LEVAQUIN) IVPB 750 mg  Status:  Discontinued     02/23/17 1452   02/24/17 0900  ceFEPIme (MAXIPIME) 2 g in dextrose 5 % 50 mL IVPB  Status:  Discontinued     02/26/17 1456   02/24/17 0500  vancomycin (VANCOCIN) IVPB 750 mg/150 ml premix     03/03/17 1704   02/23/17 2030  ceFEPIme (MAXIPIME) 2 g in dextrose 5 % 50 mL IVPB  Status:  Discontinued     02/24/17 0830   02/23/17 1230  vancomycin (VANCOCIN) 1,250 mg in sodium chloride 0.9 % 250 mL IVPB  Status:  Discontinued     02/23/17 1500   02/22/17 2300  aztreonam (AZACTAM) 1 g in dextrose 5 % 50 mL IVPB  Status:  Discontinued     02/23/17 1452   02/22/17 1500  aztreonam (AZACTAM) 2 g in dextrose 5 % 50 mL IVPB     02/22/17 1957   02/22/17 1130  vancomycin (VANCOCIN) 2,000 mg in sodium chloride 0.9 % 500 mL IVPB     02/22/17 1802   02/22/17 1100  levofloxacin (LEVAQUIN) IVPB 750 mg     02/22/17 1309   02/22/17 1100  aztreonam (AZACTAM) 2 g in  dextrose 5 % 50 mL IVPB  Status:  Discontinued     02/22/17 1444   02/22/17 1100  vancomycin (VANCOCIN) IVPB 1000 mg/200 mL premix  Status:  Discontinued     02/22/17 1122      Devices    LINES / TUBES:  CorTrak 11/28>>    Continuous Infusions: . sodium chloride 10 mL/hr at 03/07/17 0543  . dextrose 50 mL/hr at 03/08/17 1113  . levETIRAcetam Stopped (03/09/17 0035)  . valproate sodium 500 mg (03/09/17 0555)     Objective: Vitals:   03/08/17 1957 03/09/17 0035 03/09/17 0422 03/09/17 0558  BP:  (!) 115/53 (!) 96/54 114/67  Pulse:  88 83 84  Resp:  (!) 28 (!) 29 (!) 28  Temp:  98.1 F (36.7 C) 99.1 F (37.3 C)   TempSrc:  Oral Oral   SpO2: 94% 92% 90% 90%  Weight:   187 lb 6.3 oz (85 kg)   Height:        Intake/Output Summary (Last 24 hours) at 03/09/2017 0741 Last data filed at 03/09/2017 0100 Gross per 24 hour  Intake 430.83 ml  Output 1850 ml  Net -1419.17 ml   Filed Weights   03/07/17 0300 03/08/17 0248 03/09/17 0422  Weight: 177 lb 0.5 oz (80.3 kg) 181 lb 14.1 oz (82.5 kg) 187 lb 6.3 oz (85 kg)  Physical Exam:  General: A/O 2 (does not know when, why),, No acute respiratory distress Neck:  Negative scars, masses, torticollis, positive lymphadenopathy, JVD Lungs: Clear to auscultation bilaterally without wheezes or crackles Cardiovascular: Regular rate and rhythm without murmur gallop or rub normal S1 and S2 Abdomen: negative abdominal pain, nondistended, positive soft, bowel sounds, no rebound, no ascites, no appreciable mass Extremities: No significant cyanosis, clubbing, or edema bilateral lower extremities Skin: Negative rashes, lesions, ulcers Psychiatric:  Difficult to assess secondary to patient's altered mental status Central nervous system:  Cranial nerves II through XII intact, tongue/uvula midline, all extremities muscle strength 5/5, sensation intact throughout, negative dysarthria, negative expressive aphasia, negative receptive  aphasia.  .     Data Reviewed: Care during the described time interval was provided by me .  I have reviewed this patient's available data, including medical history, events of note, physical examination, and all test results as part of my evaluation.   CBC: Recent Labs  Lab 03/03/17 0415 03/04/17 0615  03/05/17 0418 03/05/17 1703 03/06/17 0417 03/07/17 0331 03/08/17 0530 03/09/17 0500  WBC 9.3 9.8  --  10.1  --  8.9 11.8* 10.6* 11.4*  NEUTROABS 6.8 6.7  --  7.7  --  5.1  --   --  6.7  HGB 10.0* 9.2*   < > 9.1* 8.8* 8.8* 9.0* 8.5* 8.3*  HCT 31.6* 28.0*   < > 26.1* 26.4* 26.1* 27.8* 25.7* 25.8*  MCV 99.7 96.9  --  98.5  --  96.3 97.2 99.2 98.5  PLT 206 203  --  246  --  226 298 338 332   < > = values in this interval not displayed.   Basic Metabolic Panel: Recent Labs  Lab 03/04/17 0615  03/05/17 0419 03/05/17 1703 03/06/17 0417 03/07/17 0331 03/08/17 0530 03/09/17 0500  NA 135  --  135 135 138 138 137 135  K 3.5  --  3.1* 3.3* 3.1* 3.0* 3.4* 2.9*  CL 88*  --  90* 92* 94* 97* 99* 96*  CO2 37*  --  34* 33* 34* 30 29 31   GLUCOSE 129*  --  118* 109* 94 84 105* 102*  BUN 23*  --  20 19 21* 18 14 8   CREATININE 0.67  --  0.74 0.70 0.73 0.64 0.63 0.67  CALCIUM 8.0*  --  8.2* 8.2* 8.2* 8.4* 8.5* 8.1*  MG 2.1   < >  --  2.3 2.3 2.4 2.3 2.2  PHOS 2.3*  --  2.7  --  3.2 2.6 2.0*  --    < > = values in this interval not displayed.   GFR: Estimated Creatinine Clearance: 67.5 mL/min (by C-G formula based on SCr of 0.67 mg/dL). Liver Function Tests: Recent Labs  Lab 03/02/17 1028 03/03/17 0415 03/04/17 0615 03/05/17 0419 03/06/17 0417  ALBUMIN 3.0* 3.0* 2.8* 3.3* 3.2*   No results for input(s): LIPASE, AMYLASE in the last 168 hours. No results for input(s): AMMONIA in the last 168 hours. Coagulation Profile: No results for input(s): INR, PROTIME in the last 168 hours. Cardiac Enzymes: No results for input(s): CKTOTAL, CKMB, CKMBINDEX, TROPONINI in the last 168  hours. BNP (last 3 results) No results for input(s): PROBNP in the last 8760 hours. HbA1C: No results for input(s): HGBA1C in the last 72 hours. CBG: Recent Labs  Lab 03/08/17 1147 03/08/17 1719 03/08/17 2145 03/09/17 0008 03/09/17 0426  GLUCAP 110* 91 84 99 102*   Lipid Profile: No results for input(s): CHOL,  HDL, LDLCALC, TRIG, CHOLHDL, LDLDIRECT in the last 72 hours. Thyroid Function Tests: No results for input(s): TSH, T4TOTAL, FREET4, T3FREE, THYROIDAB in the last 72 hours. Anemia Panel: No results for input(s): VITAMINB12, FOLATE, FERRITIN, TIBC, IRON, RETICCTPCT in the last 72 hours. Urine analysis:    Component Value Date/Time   COLORURINE COLORLESS (A) 03/02/2017 1045   APPEARANCEUR CLEAR 03/02/2017 1045   LABSPEC 1.006 03/02/2017 1045   PHURINE 7.0 03/02/2017 1045   GLUCOSEU NEGATIVE 03/02/2017 1045   HGBUR NEGATIVE 03/02/2017 1045   HGBUR negative 09/16/2009 0954   BILIRUBINUR NEGATIVE 03/02/2017 1045   KETONESUR NEGATIVE 03/02/2017 1045   PROTEINUR NEGATIVE 03/02/2017 1045   UROBILINOGEN 0.2 09/05/2014 2126   NITRITE NEGATIVE 03/02/2017 1045   LEUKOCYTESUR NEGATIVE 03/02/2017 1045   Sepsis Labs: @LABRCNTIP (procalcitonin:4,lacticidven:4)  ) Recent Results (from the past 240 hour(s))  Culture, respiratory (NON-Expectorated)     Status: None   Collection Time: 03/02/17  8:49 AM  Result Value Ref Range Status   Specimen Description TRACHEAL ASPIRATE  Final   Special Requests Normal  Final   Gram Stain   Final    RARE WBC PRESENT, PREDOMINANTLY MONONUCLEAR RARE SQUAMOUS EPITHELIAL CELLS PRESENT RARE GRAM POSITIVE COCCI IN CHAINS    Culture FEW Consistent with normal respiratory flora.  Final   Report Status 03/04/2017 FINAL  Final  Culture, blood (routine x 2)     Status: None   Collection Time: 03/02/17 10:28 AM  Result Value Ref Range Status   Specimen Description BLOOD RIGHT HAND  Final   Special Requests   Final    BOTTLES DRAWN AEROBIC ONLY  Blood Culture adequate volume   Culture NO GROWTH 5 DAYS  Final   Report Status 03/07/2017 FINAL  Final  Culture, blood (routine x 2)     Status: None   Collection Time: 03/02/17 10:31 AM  Result Value Ref Range Status   Specimen Description BLOOD RIGHT HAND  Final   Special Requests   Final    BOTTLES DRAWN AEROBIC ONLY Blood Culture adequate volume   Culture NO GROWTH 5 DAYS  Final   Report Status 03/07/2017 FINAL  Final  Culture, Urine     Status: None   Collection Time: 03/02/17 10:46 AM  Result Value Ref Range Status   Specimen Description URINE, CATHETERIZED  Final   Special Requests Normal  Final   Culture NO GROWTH  Final   Report Status 03/03/2017 FINAL  Final         Radiology Studies: No results found.      Scheduled Meds: . ARIPiprazole  20 mg Per Tube Daily  . aspirin  81 mg Per Tube Daily  . atorvastatin  20 mg Per Tube QHS  . baclofen  5 mg Per Tube BID  . chlorhexidine  15 mL Mouth Rinse BID  . Chlorhexidine Gluconate Cloth  6 each Topical Daily  . folic acid  1 mg Intravenous Daily  . heparin  5,000 Units Subcutaneous Q8H  . ipratropium-albuterol  3 mL Nebulization BID  . levothyroxine  50 mcg Intravenous Daily  . mouth rinse  15 mL Mouth Rinse q12n4p  . sennosides  5 mL Per Tube BID  . sodium chloride flush  10-40 mL Intracatheter Q12H  . thiamine injection  100 mg Intravenous Daily   Continuous Infusions: . sodium chloride 10 mL/hr at 03/07/17 0543  . dextrose 50 mL/hr at 03/08/17 1113  . levETIRAcetam Stopped (03/09/17 0035)  . valproate sodium 500 mg (03/09/17 0555)  LOS: 15 days    Time spent: 40 minutes    Bjorn Hallas, Geraldo Docker, MD [facility] Pager [phone]   If 7PM-7AM, please contact night-coverage [email or web address] Password [facility] 03/09/2017, 7:41 AM

## 2017-03-10 LAB — BASIC METABOLIC PANEL
Anion gap: 7 (ref 5–15)
BUN: 12 mg/dL (ref 6–20)
CHLORIDE: 98 mmol/L — AB (ref 101–111)
CO2: 31 mmol/L (ref 22–32)
CREATININE: 0.77 mg/dL (ref 0.44–1.00)
Calcium: 8.4 mg/dL — ABNORMAL LOW (ref 8.9–10.3)
GFR calc Af Amer: >60 mL/min (ref >60)
GFR calc non Af Amer: >60 mL/min (ref >60)
GLUCOSE: 130 mg/dL — AB (ref 65–99)
Potassium: 3 mmol/L — ABNORMAL LOW (ref 3.5–5.1)
SODIUM: 136 mmol/L (ref 135–145)

## 2017-03-10 LAB — MAGNESIUM: Magnesium: 2.1 mg/dL (ref 1.7–2.4)

## 2017-03-10 LAB — GLUCOSE, CAPILLARY
GLUCOSE-CAPILLARY: 94 mg/dL (ref 65–99)
GLUCOSE-CAPILLARY: 102 mg/dL — AB (ref 65–99)
Glucose-Capillary: 128 mg/dL — ABNORMAL HIGH (ref 65–99)
Glucose-Capillary: 102 mg/dL — ABNORMAL HIGH (ref 65–99)
Glucose-Capillary: 77 mg/dL (ref 65–99)

## 2017-03-10 LAB — PHOSPHORUS: PHOSPHORUS: 3.2 mg/dL (ref 2.5–4.6)

## 2017-03-10 MED ORDER — POTASSIUM CHLORIDE 20 MEQ/15ML (10%) PO SOLN
60.0000 meq | Freq: Once | ORAL | Status: AC
  Administered 2017-03-10: 60 meq
  Filled 2017-03-10: qty 45

## 2017-03-10 NOTE — Progress Notes (Signed)
Patient to transfer to 3W23 report given to receiving nurse, all questions answered at this time.  Pt. VSS with no s/s of distress noted.  Patient stable at transfer.

## 2017-03-10 NOTE — Progress Notes (Addendum)
ENT Consult Progress Note  Subjective:  Patient more awake and with moderately improving dysphonia.  SLP evaluated and FEES demonstrated decreased laryngeal sensation, as well as aspiration. NG tube was placed.  Patient remains on room air, no SOA.   Objective: Vitals:   03/10/17 1159 03/10/17 1332  BP: (!) 108/53 (!) 121/58  Pulse: 90 93  Resp: 19 20  Temp: 99.2 F (37.3 C) 100.1 F (37.8 C)  SpO2: 93% 94%    Physical Exam: CONSTITUTIONAL: well developed, nourished, no distress, awake but only partially oriented. Agitated. Somewhat confused.  CARDIOVASCULAR: normal rate and regular rhythm PULMONARY/CHEST WALL: effort normal and no stridor, no stertor, no dysphonia HENT: Head : normocephalic and atraumatic Ears: Right ear:   canal normal, external ear normal and hearing normal Left ear:   canal normal, external ear normal and hearing normal Nose: nose normal and no purulence Mouth/Throat:  Mouth: uvula midline and no oral lesions, edentulous Throat: oropharynx clear and moist Mucous membranes: normal EYES: conjunctiva normal, EOM normal and PERRL NECK: supple, trachea normal and no thyromegaly or cervical LAD    Data Review/Consults/Discussions:  Called to discuss case with Dr. Sherral Hammers, Internal Medicine  Procedure: Transnasal Flexible Laryngoscopy  Preoperative Diagnosis: dysphonia Postoperative Diagnosis: dysphonia, subglottic stenosis Procedure: Transnasal fiberoptic laryngoscopy.  Estimated Blood Loss: 0 mL.  Complications: None.  Findings: The nasal cavity and nasopharynx are unremarkable. There are no suspicious findings in the nasopharynx or Fossa of Rosenmller. The tongue base, pharyngeal walls, piriform sinuses, vallecula, epiglottis and postcricoid region are normal in appearance.  Laryngeal sensation is decreased.  The bilateral vocal cords have preserved architecture and aBduction, but with incomplete adduction. There is a developing subglottic stenosis in  the immediate subglottis, with a small complete band which touches about mid-length of the cords. Anterior and posterior to this, there is ample room for air movement.  Description of Procedure: With the patient in the sitting position, topical  Afrin-lidocaine mixture in an atomizer was applied to the nose. The scope was passed through the nose. Examination was carried out of the nose, nasopharynx, oropharynx, hypopharynx, and larynx with findings as noted above. Scope was removed.  The patient tolerated the procedure well.    CT neck reviewed- no retropharyngeal fluid collection  Assessment: Dominique Acosta 66 y.o. female who presents with dysphonia upon extubation after having altered mental status. Vocal cord movement is preserved, but there is a developing immediate subglottic stenosis which would benefit from excision and dilation in the operating room.    Plan:  -Will plan for Suspension Microdirect Laryngoscopy with Acosta laser dilation and balloon dilation, with kenalog injection in the operating room on Tuesday, December 4th with myself and Dr. Redmond Baseman.  -Recommend Pulmicort nebulization for assistance in progression with stenosis.    Thank you for involving [facility] in the care of this patient. Should you need further assistance, please call our office at [phone].    Gavin Pound, MD

## 2017-03-10 NOTE — Progress Notes (Signed)
I made contact with pt's sister by phone. Pt lives alone with a caregiver assisting 3 hrs per day. She does not have 24/7 care at d/c which she will need. Pt was previously at [facility] in June and sister is requesting [facility]. I explained to sister that insurance will not allow admit to CIR then SNF after CIR.  Sister is also requesting SW contact her to assist with POA . I have alerted SW and RN CM. We will sign off at this time. [phone]

## 2017-03-10 NOTE — Clinical Social Work Note (Signed)
Clinical Social Work Assessment  Patient Details  Name: Dominique Acosta MRN: [id number] Date of Birth: Jun 25, 1950  Date of referral:  03/10/17               Reason for consult:  Facility Placement, Discharge Planning                Permission sought to share information with:  Facility Sport and exercise psychologist, Family Supports Permission granted to share information::  Yes, Verbal Permission Granted  Name::     Ilithyia Titzer  Agency::  SNF's  Relationship::  Sister  Contact Information:  [phone]  Housing/Transportation Living arrangements for the past 2 months:  Apartment Source of Information:  Medical Team, Other (Comment Required)(Sister) Patient Interpreter Needed:  None Criminal Activity/Legal Involvement Pertinent to Current Situation/Hospitalization:  No - Comment as needed Significant Relationships:  Siblings Lives with:  Siblings Do you feel safe going back to the place where you live?  Yes Need for family participation in patient care:  Yes (Comment)  Care giving concerns:  PT recommending CIR. CSW initiating SNF backup referral.   Social Worker assessment / plan:  Patient not fully oriented. CSW called patient's sister, introduced role, and explained that PT recommendations would be discussed. Discussed potential barriers to CIR. Patient's sister is agreeable to SNF if CIR cannot take her. First preference is [facility] as she has been there before. Hospital liaison notified. PAS[place] expired. CSW will submit closer to discharge as her last two PAS[place]'s have only been for 30 days. Patient still has NG tube, mittens, and side rails. No further concerns. CSW encouraged patient's sister to contact CSW as needed. CSW will continue to follow patient and her sister for support and facilitate discharge to SNF, if needed, once medically stable.  Employment status:  Retired Nurse, adult PT Recommendations:  Inpatient [place] / Referral  to community resources:  [place]  Patient/Family's Response to care:  Patient not fully oriented. Patient's sister agreeable to both CIR and SNF. Patient's sister supportive and involved in patient's care. Patient's sister appreciated social work intervention.  Patient/Family's Understanding of and Emotional Response to Diagnosis, Current Treatment, and Prognosis:  Patient not fully oriented. Patient's sister has a good understanding of the reason for admission and her need for rehab prior to returning home. Patient's sister appears happy with hospital care.  Emotional Assessment Appearance:  Appears stated age Attitude/Demeanor/Rapport:  Unable to Assess Affect (typically observed):  Unable to Assess Orientation:  Oriented to Self, Oriented to  Time Alcohol / Substance use:  Never Used Psych involvement (Current and /or in the community):  No (Comment)  Discharge Needs  Concerns to be addressed:  Care Coordination Readmission within the last 30 days:  No Current discharge risk:  Cognitively Impaired, Dependent with Mobility Barriers to Discharge:  [place] ([place]), Continued Medical Work up, Requiring sitter/restraints, Other(Cortrack)   Candie Chroman, LCSW 03/10/2017, 12:29 PM

## 2017-03-10 NOTE — Progress Notes (Signed)
Pt arrived to unit from [facility] via bed around 1330 today. Physical therapy worked with patient soon after arrival ambulating in hallway about 52ft and now patient is currently sitting up in recliner chair resting. Tube feed continuing at goal rate of 81mL/hr through R nare cortrak. Pt denies pain. VSS, running low grade temp of 100.1 - will continue to monitor.

## 2017-03-10 NOTE — Progress Notes (Signed)
Physical Therapy Treatment Patient Details Name: Dominique Acosta MRN: [id number] DOB: 1950/07/20 Today's Date: 03/10/2017    History of Present Illness Pt is a 66 y.o. female former smoker who was found unresponsive after she called EMS. Patient had agonal respirations and hyperthermia. She required endotracheal intubation for airway protection 11/14. ENT was consulted when she did not have a cuff leak and MRI showed retropharyngeal fluid collection (resolved on CT Neck 11/26). Pt self-extubated on 11/24. Known history of COPD, coronary disease, MI, depression, schizophrenia, psychosis, dystonia, seizure disorder, substance abuse, chronic pain, and hypothyroidism.     PT Comments    Pt making steady progress. Continue to feel pt could benefit from CIR.   Follow Up Recommendations  CIR     Equipment Recommendations  Other (comment)(To be determined in next venue)    Recommendations for Other Services       Precautions / Restrictions Precautions Precautions: Fall    Mobility  Bed Mobility Overal bed mobility: Needs Assistance Bed Mobility: Supine to Sit     Supine to sit: Mod assist;HOB elevated     General bed mobility comments: assist to move LE's off bed and to elevate trunk into sitting  Transfers Overall transfer level: Needs assistance Equipment used: Rolling walker (2 wheeled) Transfers: Sit to/from [facility] Sit to Stand: Min assist;+2 safety/equipment Stand pivot transfers: Min assist;+2 physical assistance       General transfer comment: Assist to bring hips up and for balance. 2nd person for safety  Ambulation/Gait Ambulation/Gait assistance: Mod assist;Min assist;+2 safety/equipment Ambulation Distance (Feet): 50 Feet Assistive device: Rolling walker (2 wheeled) Gait Pattern/deviations: Step-through pattern;Decreased step length - right;Decreased step length - left;Drifts right/left Gait velocity: decr Gait velocity interpretation: Below  normal speed for age/gender General Gait Details: Assist for balance and support. Very close follow with recliner due to pt unpredicatability. Pt required more assist for balance and direction as distance increased.   Stairs            Wheelchair Mobility    Modified Rankin (Stroke Patients Only)       Balance Overall balance assessment: Needs assistance Sitting-balance support: Feet supported Sitting balance-Leahy Scale: Fair Sitting balance - Comments: supervision for safety   Standing balance support: Bilateral upper extremity supported Standing balance-Leahy Scale: Poor Standing balance comment: walker and min assist for static standing                            Cognition Arousal/Alertness: Awake/alert Behavior During Therapy: Impulsive Overall Cognitive Status: Impaired/Different from baseline Area of Impairment: Orientation;Attention;Memory;Following commands;Safety/judgement;Problem solving                 Orientation Level: Disoriented to;Time;Situation Current Attention Level: Focused;Sustained Memory: Decreased short-term memory Following Commands: Follows one step commands inconsistently Safety/Judgement: Decreased awareness of safety;Decreased awareness of deficits   Problem Solving: Difficulty sequencing;Slow processing;Requires verbal cues;Requires tactile cues General Comments: Pt perseverating on "this is ridiculous," but wouldn't express what was ridiculous      Exercises      General Comments        Pertinent Vitals/Pain Pain Assessment: Faces Faces Pain Scale: Hurts little more Pain Location: back Pain Descriptors / Indicators: Grimacing Pain Intervention(s): Limited activity within patient's tolerance;Monitored during session;Repositioned    Home Living                      Prior Function  PT Goals (current goals can now be found in the care plan section) Progress towards PT goals: Progressing  toward goals    Frequency    Min 3X/week      PT Plan Current plan remains appropriate    Co-evaluation              AM-PAC PT "6 Clicks" Daily Activity  Outcome Measure  Difficulty turning over in bed (including adjusting bedclothes, sheets and blankets)?: None Difficulty moving from lying on back to sitting on the side of the bed? : Unable Difficulty sitting down on and standing up from a chair with arms (e.g., wheelchair, bedside commode, etc,.)?: Unable Help needed moving to and from a bed to chair (including a wheelchair)?: A Little Help needed walking in hospital room?: A Lot Help needed climbing 3-5 steps with a railing? : Total 6 Click Score: 12    End of Session Equipment Utilized During Treatment: Gait belt Activity Tolerance: Patient tolerated treatment well Patient left: in chair;with call bell/phone within reach;with chair alarm set Nurse Communication: Mobility status PT Visit Diagnosis: Other abnormalities of gait and mobility (R26.89);Muscle weakness (generalized) (M62.81)     Time: 1696-7893 PT Time Calculation (min) (ACUTE ONLY): 24 min  Charges:  $Gait Training: 23-37 mins                    G Codes:       [facility] PT [place] 03/10/2017, 4:09 PM

## 2017-03-10 NOTE — Progress Notes (Addendum)
PROGRESS NOTE    Dominique Acosta  AYT:[id number] DOB: 05-17-1950 DOA: 02/22/2017 PCP: Lorene Dy, MD   Brief Narrative:  67 y.o. WF PMHx Chronic Arm pain, Chronic pain, COPD (chronic obstructive pulmonary disease), CAD, Polysubstance abuse, Depression, Dystonia, Epilepsy, Schizophrenia, Psychosis, Seizures HLD, Hypothyroid, Insomnia, Leukemia , MI,.   Found unresponsive after she called EMS. Patient had agonal respirations and hyperthermia. Required endotracheal intubation for airway protection. Patient has not been on treatment for her hypothyroidism     Subjective: 11/30 A/O 3 (does not know why), negative SOB, negative CP, negative N/V, negative abdominal pain. Still confusion constantly pulling on wires and tubes therefore requiring mittens and redirection. Follows commands       Assessment & Plan:   Active Problems:   Acute encephalopathy   Acute respiratory failure with hypoxia (HCC)   Acute respiratory failure with hypoxia  -Retropharyngeal edema appears to have resolved per CT neck 11/26 results below. -Stable postextubation -Patient has completed course of Decadron and Lasix -Per RN Pamala Hurry 11/30 ENT has already performed laryngoscopy: Awaiting for official note. Unofficial report from RN scarring that will require surgical intervention.     MRSA pneumonia   -Complete ten-day course antibiotics    Severe Hypothyroidism -11/15 TSH= 51.3 -Synthroid IV 50 g daily -Follow electrolytes daily:  Mg, K, PO4 given patient's severe hypothyroidism as per ATP ramp so will significantly deplete these electrolytes    Epilepsy  -stable -1000 mg  BID -Valproate 550 mg  TID   Acute encephalopathy/AMS -Multifactorial, acute respiratory failure, severe hypothyroidism, seizures?  -EEG showed negative seizure activity however was abnormal see results below.  -Per sister this is not patient's baseline. Able to perform ADLs with assistance of caregiver on a normal basis.     -Ambulate q shift   Dysphagia -11/28 failed swallow study -11/28 CorTrak tube placed -Consult to nutrition for enteral feeding placed -Scheduled for reevaluation for swallow study on Monday 12/3  Subglottic stenosis -Discussed case with Dr. Lind Guest ENT, plan is for patient to be taken to surgery on Tuesday, December 4. Dr. Lind Guest ENT, and Dr. Redmond Baseman will be performing surgery. -Nothing by mouth Monday midnight -Start Pulmicort nebulizer   Hypokalemia -Potassium 60 mEq  Hypophosphatemia -Resolved continue to watch closely secondary to patient's severe hypothyroidism  Goals of care -Patient has not been out of bed since admission. Patient will require PEG tube placement if her dysphagia does not significantly improve over the next several days. -11/29 place consult to inpatient rehabilitation for evaluation for CIR (patient has not been out of bed unsure she will meet criteria) -11/30 spoke with LCSW Judson Roch will speak with family concerning SNF placement as a backup plan as do not believe patient will qualify for CIR      DVT prophylaxis: Subcutaneous heparin Code Status: Partial Family Communication: None Disposition Plan: TBD   Consultants:  [facility] Dr. Lind Guest  Neurology    Procedures/Significant Events:  11/14 - Admit 11/14 UDS:  Negative  11/15 CONTINUOUS EEG :  moderately abnormal continuous video EEG due to loss of normal background, diffuse sharply contoured slowing (resolved), and a partial burst-suppression pattern. This was indicative of an improving global cerebral disturbance. No seizures or definite epileptiform discharges were seen, however sharp activity was present early in the record.   11/18 MRI BRAIN W/O :-Stable and without explanation for symptoms.-Mild periventricular white matter disease.-Sinusitis with ethmoid and sphenoid fluid levels, also seen on prior. Bilateral partial mastoid opacification. 11/18 MRI C-SPINE  W/O contrast:-Prominent edema in the bilateral neck with retropharyngeal collection.  -Submandibular glands appears thickened and edematous and this may be submandibular sialadenitis (bilaterality suggesting viral/systemic process). Suppurative infection is not excluded. -Negative acute findings to explain left arm deficit. - -Disc and facet degeneration as described. Spinal stenosis with mild cord flattening at C5-6. Foraminal narrowings noted above. 11/20 - Improving mentation on Precedex. No cuff leak. Tolerating PS 5/5.  11/22 - Patient apneic on sedation w/ SBT. FUO >> cultures repeated. 11/23 - No cuff leak. Following commands and much more awake & cooperative. 11/24 - Still no cuff leak >> consulted ENT. Self Extubated w/ subsequent stridor >> tx w/ Decadron, Lasix, Epi & BiPAP 11/26 CT soft tissue neck W contrast:-Resolution of retropharyngeal fluid collection. No mass or abscess identified. -Negative high-grade stricture. Mild rightward tracheal deviation. -Paranasal sinus mucosal thickening, fluid levels, left mastoid effusion likely due to recent intubation.      I have personally reviewed and interpreted all radiology studies and my findings are as above.  VENTILATOR SETTINGS:    Cultures MRSA PCR :  Positive  Blood Cultures x2 11/14:  Negative  Tracheal Aspirate Culture 11/14:  MRSA Urine Streptococcal Antigen 11/14:  Negative  Urine Legionella Antigen 11/14:  Negative  Tracheal Aspirate Culture 11/22 >>> Urine Culture 11/22:  Negative  Blood Cultures x2 11/22 >>>   Antimicrobials: Anti-infectives (From admission, onward)   Start     Stop   02/24/17 1200  levofloxacin (LEVAQUIN) IVPB 750 mg  Status:  Discontinued     02/23/17 1452   02/24/17 0900  ceFEPIme (MAXIPIME) 2 g in dextrose 5 % 50 mL IVPB  Status:  Discontinued     02/26/17 1456   02/24/17 0500  vancomycin (VANCOCIN) IVPB 750 mg/150 ml premix     03/03/17 1704   02/23/17 2030  ceFEPIme (MAXIPIME) 2 g in  dextrose 5 % 50 mL IVPB  Status:  Discontinued     02/24/17 0830   02/23/17 1230  vancomycin (VANCOCIN) 1,250 mg in sodium chloride 0.9 % 250 mL IVPB  Status:  Discontinued     02/23/17 1500   02/22/17 2300  aztreonam (AZACTAM) 1 g in dextrose 5 % 50 mL IVPB  Status:  Discontinued     02/23/17 1452   02/22/17 1500  aztreonam (AZACTAM) 2 g in dextrose 5 % 50 mL IVPB     02/22/17 1957   02/22/17 1130  vancomycin (VANCOCIN) 2,000 mg in sodium chloride 0.9 % 500 mL IVPB     02/22/17 1802   02/22/17 1100  levofloxacin (LEVAQUIN) IVPB 750 mg     02/22/17 1309   02/22/17 1100  aztreonam (AZACTAM) 2 g in dextrose 5 % 50 mL IVPB  Status:  Discontinued     02/22/17 1444   02/22/17 1100  vancomycin (VANCOCIN) IVPB 1000 mg/200 mL premix  Status:  Discontinued     02/22/17 1122      Devices    LINES / TUBES:  CorTrak 11/28>>    Continuous Infusions: . sodium chloride 10 mL/hr at 03/07/17 0543  . dextrose 50 mL/hr at 03/10/17 0108  . feeding supplement (JEVITY 1.2 CAL) 1,000 mL (03/09/17 1810)  . levETIRAcetam Stopped (03/09/17 2259)  . valproate sodium 500 mg (03/10/17 0532)     Objective: Vitals:   03/09/17 1940 03/09/17 2300 03/09/17 2339 03/10/17 0437  BP:   104/60 121/67  Pulse: 97 86 87 95  Resp: (!) 23 19 (!) 34 (!) 25  Temp:  99.4 F (37.4 C)  98.6 F (37 C) 99.2 F (37.3 C)  TempSrc: Oral  Oral Oral  SpO2: 98% 93% 92% 93%  Weight:    187 lb 9.8 oz (85.1 kg)  Height:        Intake/Output Summary (Last 24 hours) at 03/10/2017 9735 Last data filed at 03/09/2017 2243 Gross per 24 hour  Intake 2522.09 ml  Output 1300 ml  Net 1222.09 ml   Filed Weights   03/08/17 0248 03/09/17 0422 03/10/17 0437  Weight: 181 lb 14.1 oz (82.5 kg) 187 lb 6.3 oz (85 kg) 187 lb 9.8 oz (85.1 kg)     Physical Exam:  General: A/O 3 (does not know why) confused but follows commands easily redirectable, No acute respiratory distress Neck:  Negative scars, masses, torticollis,  lymphadenopathy, JVD, RIGHT IJ in place covered and clean negative sign of infection Lungs: some mild diffuse expiratory wheezing, negative crackles, negative rhonchi  Cardiovascular: Regular rate and rhythm without murmur gallop or rub normal S1 and S2 Abdomen: negative abdominal pain, nondistended, positive soft, bowel sounds, no rebound, no ascites, no appreciable mass Extremities: No significant cyanosis, clubbing, or edema bilateral lower extremities Skin: Negative rashes, lesions, ulcers Psychiatric:  Negative depression, negative anxiety, negative fatigue, negative mania  Central nervous system:  Cranial nerves II through XII intact, tongue/uvula midline, all extremities muscle strength 5/5, sensation intact throughout, negative dysarthria, negative expressive aphasia, negative receptive aphasia.   .     Data Reviewed: Care during the described time interval was provided by me .  I have reviewed this patient's available data, including medical history, events of note, physical examination, and all test results as part of my evaluation.   CBC: Recent Labs  Lab 03/04/17 0615  03/05/17 0418 03/05/17 1703 03/06/17 0417 03/07/17 0331 03/08/17 0530 03/09/17 0500  WBC 9.8  --  10.1  --  8.9 11.8* 10.6* 11.4*  NEUTROABS 6.7  --  7.7  --  5.1  --   --  6.7  HGB 9.2*   < > 9.1* 8.8* 8.8* 9.0* 8.5* 8.3*  HCT 28.0*   < > 26.1* 26.4* 26.1* 27.8* 25.7* 25.8*  MCV 96.9  --  98.5  --  96.3 97.2 99.2 98.5  PLT 203  --  246  --  226 298 338 332   < > = values in this interval not displayed.   Basic Metabolic Panel: Recent Labs  Lab 03/06/17 0417 03/07/17 0331 03/08/17 0530 03/09/17 0500 03/09/17 1555 03/10/17 0549  NA 138 138 137 135  --  136  K 3.1* 3.0* 3.4* 2.9* 3.5 3.0*  CL 94* 97* 99* 96*  --  98*  CO2 34* 30 29 31   --  31  GLUCOSE 94 84 105* 102*  --  130*  BUN 21* 18 14 8   --  12  CREATININE 0.73 0.64 0.63 0.67  --  0.77  CALCIUM 8.2* 8.4* 8.5* 8.1*  --  8.4*  MG 2.3  2.4 2.3 2.2 2.1 2.1  PHOS 3.2 2.6 2.0* 2.2* 2.4*  --    GFR: Estimated Creatinine Clearance: 67.5 mL/min (by C-G formula based on SCr of 0.77 mg/dL). Liver Function Tests: Recent Labs  Lab 03/04/17 0615 03/05/17 0419 03/06/17 0417  ALBUMIN 2.8* 3.3* 3.2*   No results for input(s): LIPASE, AMYLASE in the last 168 hours. No results for input(s): AMMONIA in the last 168 hours. Coagulation Profile: No results for input(s): INR, PROTIME in the last 168 hours. Cardiac  Enzymes: No results for input(s): CKTOTAL, CKMB, CKMBINDEX, TROPONINI in the last 168 hours. BNP (last 3 results) No results for input(s): PROBNP in the last 8760 hours. HbA1C: No results for input(s): HGBA1C in the last 72 hours. CBG: Recent Labs  Lab 03/09/17 1326 03/09/17 1610 03/09/17 2036 03/09/17 2338 03/10/17 0432  GLUCAP 95 107* 121* 116* 128*   Lipid Profile: No results for input(s): CHOL, HDL, LDLCALC, TRIG, CHOLHDL, LDLDIRECT in the last 72 hours. Thyroid Function Tests: No results for input(s): TSH, T4TOTAL, FREET4, T3FREE, THYROIDAB in the last 72 hours. Anemia Panel: No results for input(s): VITAMINB12, FOLATE, FERRITIN, TIBC, IRON, RETICCTPCT in the last 72 hours. Urine analysis:    Component Value Date/Time   COLORURINE COLORLESS (A) 03/02/2017 1045   APPEARANCEUR CLEAR 03/02/2017 1045   LABSPEC 1.006 03/02/2017 1045   PHURINE 7.0 03/02/2017 1045   GLUCOSEU NEGATIVE 03/02/2017 1045   HGBUR NEGATIVE 03/02/2017 1045   HGBUR negative 09/16/2009 0954   BILIRUBINUR NEGATIVE 03/02/2017 1045   KETONESUR NEGATIVE 03/02/2017 1045   PROTEINUR NEGATIVE 03/02/2017 1045   UROBILINOGEN 0.2 09/05/2014 2126   NITRITE NEGATIVE 03/02/2017 1045   LEUKOCYTESUR NEGATIVE 03/02/2017 1045   Sepsis Labs: @LABRCNTIP (procalcitonin:4,lacticidven:4)  ) Recent Results (from the past 240 hour(s))  Culture, respiratory (NON-Expectorated)     Status: None   Collection Time: 03/02/17  8:49 AM  Result Value Ref  Range Status   Specimen Description TRACHEAL ASPIRATE  Final   Special Requests Normal  Final   Gram Stain   Final    RARE WBC PRESENT, PREDOMINANTLY MONONUCLEAR RARE SQUAMOUS EPITHELIAL CELLS PRESENT RARE GRAM POSITIVE COCCI IN CHAINS    Culture FEW Consistent with normal respiratory flora.  Final   Report Status 03/04/2017 FINAL  Final  Culture, blood (routine x 2)     Status: None   Collection Time: 03/02/17 10:28 AM  Result Value Ref Range Status   Specimen Description BLOOD RIGHT HAND  Final   Special Requests   Final    BOTTLES DRAWN AEROBIC ONLY Blood Culture adequate volume   Culture NO GROWTH 5 DAYS  Final   Report Status 03/07/2017 FINAL  Final  Culture, blood (routine x 2)     Status: None   Collection Time: 03/02/17 10:31 AM  Result Value Ref Range Status   Specimen Description BLOOD RIGHT HAND  Final   Special Requests   Final    BOTTLES DRAWN AEROBIC ONLY Blood Culture adequate volume   Culture NO GROWTH 5 DAYS  Final   Report Status 03/07/2017 FINAL  Final  Culture, Urine     Status: None   Collection Time: 03/02/17 10:46 AM  Result Value Ref Range Status   Specimen Description URINE, CATHETERIZED  Final   Special Requests Normal  Final   Culture NO GROWTH  Final   Report Status 03/03/2017 FINAL  Final         Radiology Studies: No results found.      Scheduled Meds: . ARIPiprazole  20 mg Per Tube Daily  . aspirin  81 mg Per Tube Daily  . atorvastatin  20 mg Per Tube QHS  . baclofen  5 mg Per Tube BID  . chlorhexidine  15 mL Mouth Rinse BID  . Chlorhexidine Gluconate Cloth  6 each Topical Daily  . feeding supplement (PRO-STAT SUGAR FREE 64)  30 mL Per Tube BID  . folic acid  1 mg Intravenous Daily  . heparin  5,000 Units Subcutaneous Q8H  . ipratropium-albuterol  3 mL Nebulization BID  . levothyroxine  50 mcg Intravenous Daily  . mouth rinse  15 mL Mouth Rinse q12n4p  . sennosides  5 mL Per Tube BID  . sodium chloride flush  10-40 mL  Intracatheter Q12H  . thiamine injection  100 mg Intravenous Daily   Continuous Infusions: . sodium chloride 10 mL/hr at 03/07/17 0543  . dextrose 50 mL/hr at 03/10/17 0108  . feeding supplement (JEVITY 1.2 CAL) 1,000 mL (03/09/17 1810)  . levETIRAcetam Stopped (03/09/17 2259)  . valproate sodium 500 mg (03/10/17 0532)     LOS: 16 days    Time spent: 40 minutes    Valory Wetherby, Geraldo Docker, MD [facility] Pager [phone]   If 7PM-7AM, please contact night-coverage [email or web address] Password [email or web address] 03/10/2017, 7:26 AM

## 2017-03-10 NOTE — Progress Notes (Signed)
I have left a message for sister to call me to begin discussions concerning rehab venue options and goals. I await her return call. [phone]

## 2017-03-11 DIAGNOSIS — Z789 Other specified health status: Secondary | ICD-10-CM

## 2017-03-11 DIAGNOSIS — Z4659 Encounter for fitting and adjustment of other gastrointestinal appliance and device: Secondary | ICD-10-CM

## 2017-03-11 DIAGNOSIS — F209 Schizophrenia, unspecified: Secondary | ICD-10-CM

## 2017-03-11 DIAGNOSIS — J15212 Pneumonia due to Methicillin resistant Staphylococcus aureus: Secondary | ICD-10-CM

## 2017-03-11 LAB — GLUCOSE, CAPILLARY
GLUCOSE-CAPILLARY: 118 mg/dL — AB (ref 65–99)
GLUCOSE-CAPILLARY: 77 mg/dL (ref 65–99)
GLUCOSE-CAPILLARY: 85 mg/dL (ref 65–99)
GLUCOSE-CAPILLARY: 99 mg/dL (ref 65–99)
Glucose-Capillary: 100 mg/dL — ABNORMAL HIGH (ref 65–99)
Glucose-Capillary: 103 mg/dL — ABNORMAL HIGH (ref 65–99)
Glucose-Capillary: 94 mg/dL (ref 65–99)

## 2017-03-11 LAB — BASIC METABOLIC PANEL
Anion gap: 7 (ref 5–15)
BUN: 11 mg/dL (ref 6–20)
CALCIUM: 8.6 mg/dL — AB (ref 8.9–10.3)
CO2: 32 mmol/L (ref 22–32)
CREATININE: 0.59 mg/dL (ref 0.44–1.00)
Chloride: 101 mmol/L (ref 101–111)
GFR calc Af Amer: >60 mL/min (ref >60)
GLUCOSE: 107 mg/dL — AB (ref 65–99)
POTASSIUM: 3.7 mmol/L (ref 3.5–5.1)
Sodium: 140 mmol/L (ref 135–145)

## 2017-03-11 LAB — MAGNESIUM: Magnesium: 2.2 mg/dL (ref 1.7–2.4)

## 2017-03-11 LAB — PHOSPHORUS: Phosphorus: 3 mg/dL (ref 2.5–4.6)

## 2017-03-11 MED ORDER — LEVOTHYROXINE SODIUM 100 MCG PO TABS
100.0000 ug | ORAL_TABLET | Freq: Every day | ORAL | Status: DC
  Administered 2017-03-12: 100 ug via ORAL
  Filled 2017-03-11: qty 1

## 2017-03-11 MED ORDER — FREE WATER
200.0000 mL | Freq: Three times a day (TID) | Status: DC
  Administered 2017-03-11 – 2017-03-14 (×9): 200 mL

## 2017-03-11 MED ORDER — FOLIC ACID 1 MG PO TABS
1.0000 mg | ORAL_TABLET | Freq: Every day | ORAL | Status: DC
  Administered 2017-03-12: 1 mg via ORAL
  Filled 2017-03-11: qty 1

## 2017-03-11 MED ORDER — VENLAFAXINE HCL ER 75 MG PO CP24
300.0000 mg | ORAL_CAPSULE | Freq: Every day | ORAL | Status: DC
Start: 2017-03-12 — End: 2017-03-17
  Administered 2017-03-12 – 2017-03-17 (×5): 300 mg via ORAL
  Filled 2017-03-11 (×6): qty 4

## 2017-03-11 MED ORDER — LEVETIRACETAM 100 MG/ML PO SOLN
1000.0000 mg | Freq: Two times a day (BID) | ORAL | Status: DC
  Administered 2017-03-11 (×2): 1000 mg via ORAL
  Filled 2017-03-11 (×3): qty 10

## 2017-03-11 MED ORDER — LEVETIRACETAM 500 MG PO TABS
1000.0000 mg | ORAL_TABLET | Freq: Two times a day (BID) | ORAL | Status: DC
  Filled 2017-03-11: qty 2

## 2017-03-11 MED ORDER — VITAMIN B-1 100 MG PO TABS
100.0000 mg | ORAL_TABLET | Freq: Every day | ORAL | Status: DC
  Administered 2017-03-12: 100 mg via ORAL
  Filled 2017-03-11: qty 1

## 2017-03-11 MED ORDER — VALPROATE SODIUM 250 MG/5ML PO SOLN
500.0000 mg | Freq: Three times a day (TID) | ORAL | Status: DC
  Administered 2017-03-11 – 2017-03-12 (×4): 500 mg via ORAL
  Filled 2017-03-11 (×5): qty 10

## 2017-03-11 NOTE — Progress Notes (Signed)
PROGRESS NOTE    Dominique Acosta   ZOX:[id number]  DOB: Sep 01, 1950  DOA: 02/22/2017 PCP: Dominique Dy, MD   Brief Narrative:  Dominique Acosta is a 66 y/o with COPD, CAD (MI), schizophrenia, dystonia, seizure disorder. She push her life alert button and was later found by EMS to be unresponsive with RR of 4, hypothermic and was intubated in the ER. The patient has a caregiver take care of her during the day. History obtained from sister included a history of abusing her medications. Review of prior chart notes an admission in 8/11 for metabolic encephalopathy Noted to have purulent secretions from ETT and have retropharyngeal edema noted on imaging on 11/26.  Respiratory culture + for MRSA.   Subjective: Confused. Has no complaints. ROS: no complaints of nausea, vomiting, constipation diarrhea, cough, dyspnea or dysuria. No other complaints.   Assessment & Plan:   Principal Problem:   MRSA pneumonia ?   Acute respiratory failure with hypoxia  - sputum + for MRSA but never had infiltrates on CXR and procalcitonin was negative  ? If respiratory failure was due to something else ie improperly taking her medications and/or viral bronchitis- ? 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 - ENT plans of excision and dilatation in OR on Tues 12/4 - pulmicort nebs recommended by ENT - meds via NG    Hypothyroid  - 11/15 TSH=51.3 -note that TSH was 43 in 4/18 and 62.1 in 5/18 (prior admissions) as well and she was suspected to be non-compliant - cont synthroid   Hypokalemia on admission - K 2.7  - adequately replaced    Acute metabolic encephalopathy - presented with encephalopathy in 4/18 as well - somewhat confused still- may have had permanent damage due to respiratory  arrest - has received daily IV Thiamine  - B12 normal - attempting to make sure we have on the correct home (psych) meds to help her come to baseline     Seizure disorder - episode of seizure vs "posturing"- has h/o dystonia- ? Overdose of neuroleptics - Neuro has been following - Keppra and Depakote started this admission - continuous EEG - nonictal   Dystonia - on Baclofen- continue  Schizophrenia - cont Abilify - resume Effexor today  Triple lumen - have asked RN to obtain peripheral IVs and remove Triple lumen  Nutrition - Jevity- d/c D5 infusion and start free water   DVT prophylaxis: heparin Code Status: no CPR, shocks or ACLS meds Family Communication:  Disposition Plan: follow on telemetry Consultants:   PCCM  Neurology Procedures:  11/15 CONTINUOUS EEG : moderatelyabnormal continuous video EEG due to loss of normal background, diffuse sharply contoured slowing(resolved), and a partial burst-suppression pattern. This was indicative of an improvingglobal cerebral disturbance.No seizuresor definite epileptiform dischargeswere seen, however sharp activity was present early in the record.  11/28 CorTrak    Antimicrobials:  Anti-infectives (From admission, onward)   Start     Dose/Rate Route Frequency Ordered Stop   02/24/17 1200  levofloxacin (LEVAQUIN) IVPB 750 mg  Status:  Discontinued     750 mg 100 mL/hr over 90 Minutes Intravenous Every 48 hours 02/22/17 1435 02/23/17 1452   02/24/17 0900  ceFEPIme (MAXIPIME) 2 g in dextrose 5 % 50 mL IVPB  Status:  Discontinued  2 g 100 mL/hr over 30 Minutes Intravenous Every 12 hours 02/24/17 0830 02/26/17 1456   02/24/17 0500  vancomycin (VANCOCIN) IVPB 750 mg/150 ml premix     750 mg 150 mL/hr over 60 Minutes Intravenous Every 12 hours 02/23/17 1500 03/03/17 1704   02/23/17 2030  ceFEPIme (MAXIPIME) 2 g in dextrose 5 % 50 mL IVPB  Status:  Discontinued     2 g 100 mL/hr over 30 Minutes Intravenous Every  24 hours 02/23/17 2013 02/24/17 0830   02/23/17 1230  vancomycin (VANCOCIN) 1,250 mg in sodium chloride 0.9 % 250 mL IVPB  Status:  Discontinued     1,250 mg 166.7 mL/hr over 90 Minutes Intravenous Every 24 hours 02/22/17 1435 02/23/17 1500   02/22/17 2300  aztreonam (AZACTAM) 1 g in dextrose 5 % 50 mL IVPB  Status:  Discontinued     1 g 100 mL/hr over 30 Minutes Intravenous Every 8 hours 02/22/17 1435 02/23/17 1452   02/22/17 1500  aztreonam (AZACTAM) 2 g in dextrose 5 % 50 mL IVPB     2 g 100 mL/hr over 30 Minutes Intravenous  Once 02/22/17 1444 02/22/17 1957   02/22/17 1130  vancomycin (VANCOCIN) 2,000 mg in sodium chloride 0.9 % 500 mL IVPB     2,000 mg 250 mL/hr over 120 Minutes Intravenous  Once 02/22/17 1122 02/22/17 1802   02/22/17 1100  levofloxacin (LEVAQUIN) IVPB 750 mg     750 mg 100 mL/hr over 90 Minutes Intravenous  Once 02/22/17 1046 02/22/17 1309   02/22/17 1100  aztreonam (AZACTAM) 2 g in dextrose 5 % 50 mL IVPB  Status:  Discontinued     2 g 100 mL/hr over 30 Minutes Intravenous  Once 02/22/17 1046 02/22/17 1444   02/22/17 1100  vancomycin (VANCOCIN) IVPB 1000 mg/200 mL premix  Status:  Discontinued     1,000 mg 200 mL/hr over 60 Minutes Intravenous  Once 02/22/17 1046 02/22/17 1122       Objective: Vitals:   03/10/17 2130 03/10/17 2203 03/11/17 0056 03/11/17 0458  BP:  (!) 116/56 (!) 119/58 (!) 121/53  Pulse: 96 88 92 88  Resp: 18 20 20 20   Temp:  98.9 F (37.2 C) 99.7 F (37.6 C) 99.4 F (37.4 C)  TempSrc:  Oral Oral Oral  SpO2: 93% 95% 94% 93%  Weight:    86.7 kg (191 lb 2.2 oz)  Height:        Intake/Output Summary (Last 24 hours) at 03/11/2017 0921 Last data filed at 03/11/2017 0500 Gross per 24 hour  Intake 1042.25 ml  Output 1350 ml  Net -307.75 ml   Filed Weights   03/09/17 0422 03/10/17 0437 03/11/17 0458  Weight: 85 kg (187 lb 6.3 oz) 85.1 kg (187 lb 9.8 oz) 86.7 kg (191 lb 2.2 oz)    Examination: General exam: Appears comfortable    HEENT: PERRLA, oral mucosa moist, no sclera icterus or thrush Respiratory system: Clear to auscultation. Respiratory effort normal. Cardiovascular system: S1 & S2 heard, RRR.  No murmurs  Gastrointestinal system: Abdomen soft, non-tender, nondistended. Normal bowel sound. No organomegaly Central nervous system: Alert- disoriented to time and place No focal neurological deficits. Extremities: No cyanosis, clubbing or edema Skin: No rashes or ulcers Psychiatry:  Mood  appropriate.     Data Reviewed: I have personally reviewed following labs and imaging studies  CBC: Recent Labs  Lab 03/05/17 0418 03/05/17 1703 03/06/17 0417 03/07/17 0331 03/08/17 0530 03/09/17 0500  WBC 10.1  --  8.9 11.8* 10.6* 11.4*  NEUTROABS 7.7  --  5.1  --   --  6.7  HGB 9.1* 8.8* 8.8* 9.0* 8.5* 8.3*  HCT 26.1* 26.4* 26.1* 27.8* 25.7* 25.8*  MCV 98.5  --  96.3 97.2 99.2 98.5  PLT 246  --  226 298 338 967   Basic Metabolic Panel: Recent Labs  Lab 03/07/17 0331 03/08/17 0530 03/09/17 0500 03/09/17 1555 03/10/17 0549 03/10/17 0728 03/11/17 0507  NA 138 137 135  --  136  --  140  K 3.0* 3.4* 2.9* 3.5 3.0*  --  3.7  CL 97* 99* 96*  --  98*  --  101  CO2 30 29 31   --  31  --  32  GLUCOSE 84 105* 102*  --  130*  --  107*  BUN 18 14 8   --  12  --  11  CREATININE 0.64 0.63 0.67  --  0.77  --  0.59  CALCIUM 8.4* 8.5* 8.1*  --  8.4*  --  8.6*  MG 2.4 2.3 2.2 2.1 2.1  --  2.2  PHOS 2.6 2.0* 2.2* 2.4*  --  3.2 3.0   GFR: Estimated Creatinine Clearance: 68.3 mL/min (by C-G formula based on SCr of 0.59 mg/dL). Liver Function Tests: Recent Labs  Lab 03/05/17 0419 03/06/17 0417  ALBUMIN 3.3* 3.2*   No results for input(s): LIPASE, AMYLASE in the last 168 hours. No results for input(s): AMMONIA in the last 168 hours. Coagulation Profile: No results for input(s): INR, PROTIME in the last 168 hours. Cardiac Enzymes: No results for input(s): CKTOTAL, CKMB, CKMBINDEX, TROPONINI in the last 168  hours. BNP (last 3 results) No results for input(s): PROBNP in the last 8760 hours. HbA1C: No results for input(s): HGBA1C in the last 72 hours. CBG: Recent Labs  Lab 03/10/17 1549 03/10/17 2017 03/11/17 0026 03/11/17 0422 03/11/17 0747  GLUCAP 94 102* 94 100* 118*   Lipid Profile: No results for input(s): CHOL, HDL, LDLCALC, TRIG, CHOLHDL, LDLDIRECT in the last 72 hours. Thyroid Function Tests: No results for input(s): TSH, T4TOTAL, FREET4, T3FREE, THYROIDAB in the last 72 hours. Anemia Panel: No results for input(s): VITAMINB12, FOLATE, FERRITIN, TIBC, IRON, RETICCTPCT in the last 72 hours. Urine analysis:    Component Value Date/Time   COLORURINE COLORLESS (A) 03/02/2017 1045   APPEARANCEUR CLEAR 03/02/2017 1045   LABSPEC 1.006 03/02/2017 1045   PHURINE 7.0 03/02/2017 1045   GLUCOSEU NEGATIVE 03/02/2017 1045   HGBUR NEGATIVE 03/02/2017 1045   HGBUR negative 09/16/2009 0954   BILIRUBINUR NEGATIVE 03/02/2017 1045   KETONESUR NEGATIVE 03/02/2017 1045   PROTEINUR NEGATIVE 03/02/2017 1045   UROBILINOGEN 0.2 09/05/2014 2126   NITRITE NEGATIVE 03/02/2017 1045   LEUKOCYTESUR NEGATIVE 03/02/2017 1045   Sepsis Labs: @LABRCNTIP (procalcitonin:4,lacticidven:4) ) Recent Results (from the past 240 hour(s))  Culture, respiratory (NON-Expectorated)     Status: None   Collection Time: 03/02/17  8:49 AM  Result Value Ref Range Status   Specimen Description TRACHEAL ASPIRATE  Final   Special Requests Normal  Final   Gram Stain   Final    RARE WBC PRESENT, PREDOMINANTLY MONONUCLEAR RARE SQUAMOUS EPITHELIAL CELLS PRESENT RARE GRAM POSITIVE COCCI IN CHAINS    Culture FEW Consistent with normal respiratory flora.  Final   Report Status 03/04/2017 FINAL  Final  Culture, blood (routine x 2)     Status: None   Collection Time: 03/02/17 10:28 AM  Result Value Ref Range Status   Specimen Description BLOOD  RIGHT HAND  Final   Special Requests   Final    BOTTLES DRAWN AEROBIC ONLY  Blood Culture adequate volume   Culture NO GROWTH 5 DAYS  Final   Report Status 03/07/2017 FINAL  Final  Culture, blood (routine x 2)     Status: None   Collection Time: 03/02/17 10:31 AM  Result Value Ref Range Status   Specimen Description BLOOD RIGHT HAND  Final   Special Requests   Final    BOTTLES DRAWN AEROBIC ONLY Blood Culture adequate volume   Culture NO GROWTH 5 DAYS  Final   Report Status 03/07/2017 FINAL  Final  Culture, Urine     Status: None   Collection Time: 03/02/17 10:46 AM  Result Value Ref Range Status   Specimen Description URINE, CATHETERIZED  Final   Special Requests Normal  Final   Culture NO GROWTH  Final   Report Status 03/03/2017 FINAL  Final         Radiology Studies: No results found.    Scheduled Meds: . ARIPiprazole  20 mg Per Tube Daily  . aspirin  81 mg Per Tube Daily  . atorvastatin  20 mg Per Tube QHS  . baclofen  5 mg Per Tube BID  . chlorhexidine  15 mL Mouth Rinse BID  . Chlorhexidine Gluconate Cloth  6 each Topical Daily  . feeding supplement (PRO-STAT SUGAR FREE 64)  30 mL Per Tube BID  . folic acid  1 mg Intravenous Daily  . heparin  5,000 Units Subcutaneous Q8H  . ipratropium-albuterol  3 mL Nebulization BID  . levothyroxine  50 mcg Intravenous Daily  . mouth rinse  15 mL Mouth Rinse q12n4p  . sennosides  5 mL Per Tube BID  . sodium chloride flush  10-40 mL Intracatheter Q12H  . thiamine injection  100 mg Intravenous Daily   Continuous Infusions: . sodium chloride 10 mL/hr at 03/10/17 0300  . dextrose 50 mL/hr at 03/11/17 0359  . feeding supplement (JEVITY 1.2 CAL) 1,000 mL (03/10/17 1251)  . levETIRAcetam Stopped (03/10/17 2200)  . valproate sodium Stopped (03/11/17 0704)     LOS: 17 days    Time spent in minutes: 35    Debbe Odea, MD [facility] Pager: [email or web address] Password [email or web address] 03/11/2017, 9:21 AM

## 2017-03-12 ENCOUNTER — Other Ambulatory Visit: Payer: Self-pay

## 2017-03-12 DIAGNOSIS — R1311 Dysphagia, oral phase: Secondary | ICD-10-CM

## 2017-03-12 LAB — BASIC METABOLIC PANEL
Anion gap: 10 (ref 5–15)
BUN: 13 mg/dL (ref 6–20)
CALCIUM: 8.6 mg/dL — AB (ref 8.9–10.3)
CHLORIDE: 99 mmol/L — AB (ref 101–111)
CO2: 30 mmol/L (ref 22–32)
CREATININE: 0.66 mg/dL (ref 0.44–1.00)
GFR calc Af Amer: >60 mL/min (ref >60)
GFR calc non Af Amer: >60 mL/min (ref >60)
Glucose, Bld: 89 mg/dL (ref 65–99)
Potassium: 3.8 mmol/L (ref 3.5–5.1)
Sodium: 139 mmol/L (ref 135–145)

## 2017-03-12 LAB — GLUCOSE, CAPILLARY
GLUCOSE-CAPILLARY: 118 mg/dL — AB (ref 65–99)
GLUCOSE-CAPILLARY: 117 mg/dL — AB (ref 65–99)
GLUCOSE-CAPILLARY: 95 mg/dL (ref 65–99)
GLUCOSE-CAPILLARY: 97 mg/dL (ref 65–99)
Glucose-Capillary: 82 mg/dL (ref 65–99)

## 2017-03-12 LAB — PHOSPHORUS: Phosphorus: 3.2 mg/dL (ref 2.5–4.6)

## 2017-03-12 LAB — TSH: TSH: 41.499 u[IU]/mL — ABNORMAL HIGH (ref 0.350–4.500)

## 2017-03-12 LAB — MAGNESIUM: Magnesium: 2.2 mg/dL (ref 1.7–2.4)

## 2017-03-12 MED ORDER — FOLIC ACID 1 MG PO TABS
1.0000 mg | ORAL_TABLET | Freq: Every day | ORAL | Status: DC
  Administered 2017-03-13 – 2017-03-17 (×5): 1 mg
  Filled 2017-03-12 (×5): qty 1

## 2017-03-12 MED ORDER — VALPROATE SODIUM 250 MG/5ML PO SOLN
500.0000 mg | Freq: Three times a day (TID) | ORAL | Status: DC
  Administered 2017-03-12 – 2017-03-14 (×6): 500 mg
  Filled 2017-03-12 (×9): qty 10

## 2017-03-12 MED ORDER — LEVOTHYROXINE SODIUM 100 MCG PO TABS
100.0000 ug | ORAL_TABLET | Freq: Every day | ORAL | Status: DC
  Administered 2017-03-13 – 2017-03-17 (×5): 100 ug
  Filled 2017-03-12 (×5): qty 1

## 2017-03-12 MED ORDER — LEVETIRACETAM 100 MG/ML PO SOLN
1000.0000 mg | Freq: Two times a day (BID) | ORAL | Status: DC
  Administered 2017-03-12 – 2017-03-14 (×4): 1000 mg
  Filled 2017-03-12 (×5): qty 10

## 2017-03-12 MED ORDER — VITAMIN B-1 100 MG PO TABS
100.0000 mg | ORAL_TABLET | Freq: Every day | ORAL | Status: DC
  Administered 2017-03-13 – 2017-03-17 (×5): 100 mg
  Filled 2017-03-12 (×5): qty 1

## 2017-03-12 NOTE — Progress Notes (Signed)
PROGRESS NOTE    Dominique Acosta   ZGY:[id number]  DOB: Jun 21, 1950  DOA: 02/22/2017 PCP: Lorene Dy, MD   Brief Narrative:  Dominique Acosta is a 66 y/o with COPD, CAD (MI), schizophrenia, dystonia, seizure disorder. She push her life alert button and was later found by EMS to be unresponsive with RR of 4, hypothermic and was intubated in the ER. The patient has a caregiver take care of her during the day. History obtained from sister included a history of abusing her medications. Review of prior chart notes an admission in 5/91 for metabolic encephalopathy Noted to have purulent secretions from ETT and have retropharyngeal edema noted on imaging on 11/26.  Respiratory culture + for MRSA.   Subjective: No complaints.  ROS: no complaints of nausea, vomiting, constipation diarrhea, cough, dyspnea or dysuria. No other complaints.   Assessment & Plan:   Principal Problem:   MRSA pneumonia ?   Acute respiratory failure with hypoxia  - sputum + for MRSA but never had infiltrates on CXR and procalcitonin was negative  ? If respiratory failure was due to something else ie improperly taking her medications and/or viral bronchitis- ? 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 - ENT plans of excision and dilatation in OR on Tues 12/4 - pulmicort nebs recommended by ENT    Hypothyroid  - 11/15 TSH=51.3 -note that TSH was 43 in 4/18 and 62.1 in 5/18 (prior admissions) as well and she was suspected to be non-compliant - cont synthroid   Hypokalemia on admission - K 2.7  - adequately replaced    Acute metabolic encephalopathy - presented with encephalopathy in 4/18 as well - somewhat confused still- may have had permanent damage due to respiratory arrest - has  received daily IV Thiamine  - B12 normal - attempting to make sure we have on the correct home (psych) meds to help her confusion to resolve     Seizure disorder - episode of seizure vs "posturing"- has h/o dystonia- ? Overdose of neuroleptics - Neuro has been following - Keppra and Depakote started this admission - continuous EEG - nonictal   Dystonia - on Baclofen- continue  Schizophrenia - cont Abilify - 12/1 resumed Effexor   Triple lumen - have asked RN to obtain peripheral IVs and remove Triple lumen- IV was placed but patient pulled it out   Nutrition - Jevity- d/c D5 infusion and start free water   DVT prophylaxis: heparin Code Status: no CPR, shocks or ACLS meds Family Communication:  Disposition Plan: follow on telemetry Consultants:   PCCM  Neurology Procedures:  11/15 CONTINUOUS EEG : moderatelyabnormal continuous video EEG due to loss of normal background, diffuse sharply contoured slowing(resolved), and a partial burst-suppression pattern. This was indicative of an improvingglobal cerebral disturbance.No seizuresor definite epileptiform dischargeswere seen, however sharp activity was present early in the record.  11/28 CorTrak    Antimicrobials:  Anti-infectives (From admission, onward)   Start     Dose/Rate Route Frequency Ordered Stop   02/24/17 1200  levofloxacin (LEVAQUIN) IVPB 750 mg  Status:  Discontinued     750 mg 100 mL/hr over 90 Minutes Intravenous Every 48 hours 02/22/17 1435 02/23/17 1452   02/24/17 0900  ceFEPIme (MAXIPIME) 2 g in dextrose 5 % 50 mL IVPB  Status:  Discontinued     2 g 100 mL/hr over 30 Minutes Intravenous Every 12 hours 02/24/17 0830 02/26/17 1456   02/24/17 0500  vancomycin (VANCOCIN) IVPB 750 mg/150 ml premix     750 mg 150 mL/hr over 60 Minutes Intravenous Every 12 hours 02/23/17 1500 03/03/17 1704   02/23/17 2030  ceFEPIme (MAXIPIME) 2 g in dextrose 5 % 50 mL IVPB  Status:  Discontinued     2 g 100 mL/hr  over 30 Minutes Intravenous Every 24 hours 02/23/17 2013 02/24/17 0830   02/23/17 1230  vancomycin (VANCOCIN) 1,250 mg in sodium chloride 0.9 % 250 mL IVPB  Status:  Discontinued     1,250 mg 166.7 mL/hr over 90 Minutes Intravenous Every 24 hours 02/22/17 1435 02/23/17 1500   02/22/17 2300  aztreonam (AZACTAM) 1 g in dextrose 5 % 50 mL IVPB  Status:  Discontinued     1 g 100 mL/hr over 30 Minutes Intravenous Every 8 hours 02/22/17 1435 02/23/17 1452   02/22/17 1500  aztreonam (AZACTAM) 2 g in dextrose 5 % 50 mL IVPB     2 g 100 mL/hr over 30 Minutes Intravenous  Once 02/22/17 1444 02/22/17 1957   02/22/17 1130  vancomycin (VANCOCIN) 2,000 mg in sodium chloride 0.9 % 500 mL IVPB     2,000 mg 250 mL/hr over 120 Minutes Intravenous  Once 02/22/17 1122 02/22/17 1802   02/22/17 1100  levofloxacin (LEVAQUIN) IVPB 750 mg     750 mg 100 mL/hr over 90 Minutes Intravenous  Once 02/22/17 1046 02/22/17 1309   02/22/17 1100  aztreonam (AZACTAM) 2 g in dextrose 5 % 50 mL IVPB  Status:  Discontinued     2 g 100 mL/hr over 30 Minutes Intravenous  Once 02/22/17 1046 02/22/17 1444   02/22/17 1100  vancomycin (VANCOCIN) IVPB 1000 mg/200 mL premix  Status:  Discontinued     1,000 mg 200 mL/hr over 60 Minutes Intravenous  Once 02/22/17 1046 02/22/17 1122       Objective: Vitals:   03/12/17 0000 03/12/17 0420 03/12/17 0828 03/12/17 1033  BP: 125/69 (!) 144/92  115/61  Pulse: 82 81  79  Resp: 18 18  20   Temp: 98.9 F (37.2 C) 98.7 F (37.1 C)  98.9 F (37.2 C)  TempSrc: Oral Oral  Oral  SpO2: 97% 94% 93% 91%  Weight:  83 kg (182 lb 15.7 oz)    Height:        Intake/Output Summary (Last 24 hours) at 03/12/2017 1356 Last data filed at 03/12/2017 1123 Gross per 24 hour  Intake 2469 ml  Output 800 ml  Net 1669 ml   Filed Weights   03/10/17 0437 03/11/17 0458 03/12/17 0420  Weight: 85.1 kg (187 lb 9.8 oz) 86.7 kg (191 lb 2.2 oz) 83 kg (182 lb 15.7 oz)    Examination: General exam: Appears  comfortable  HEENT: PERRLA, oral mucosa moist, no sclera icterus or thrush Respiratory system: Clear to auscultation. Respiratory effort normal. Cardiovascular system: S1 & S2 heard, RRR.  No murmurs  Gastrointestinal system: Abdomen soft, non-tender, nondistended. Normal bowel sound. No organomegaly Central nervous system: Alert- disoriented to time and place No focal neurological deficits. Extremities: No cyanosis, clubbing or edema Skin: No rashes or ulcers Psychiatry:  Mood  Appropriate but confused.    Data Reviewed: I have personally reviewed following labs and imaging studies  CBC: Recent Labs  Lab 03/05/17 1703 03/06/17 0417 03/07/17 0331 03/08/17 0530 03/09/17 0500  WBC  --  8.9 11.8* 10.6* 11.4*  NEUTROABS  --  5.1  --   --  6.7  HGB 8.8* 8.8* 9.0* 8.5* 8.3*  HCT 26.4* 26.1* 27.8* 25.7* 25.8*  MCV  --  96.3 97.2 99.2 98.5  PLT  --  226 298 338 924   Basic Metabolic Panel: Recent Labs  Lab 03/08/17 0530 03/09/17 0500 03/09/17 1555 03/10/17 0549 03/10/17 0728 03/11/17 0507 03/12/17 0329  NA 137 135  --  136  --  140 139  K 3.4* 2.9* 3.5 3.0*  --  3.7 3.8  CL 99* 96*  --  98*  --  101 99*  CO2 29 31  --  31  --  32 30  GLUCOSE 105* 102*  --  130*  --  107* 89  BUN 14 8  --  12  --  11 13  CREATININE 0.63 0.67  --  0.77  --  0.59 0.66  CALCIUM 8.5* 8.1*  --  8.4*  --  8.6* 8.6*  MG 2.3 2.2 2.1 2.1  --  2.2 2.2  PHOS 2.0* 2.2* 2.4*  --  3.2 3.0 3.2   GFR: Estimated Creatinine Clearance: 66.7 mL/min (by C-G formula based on SCr of 0.66 mg/dL). Liver Function Tests: Recent Labs  Lab 03/06/17 0417  ALBUMIN 3.2*   No results for input(s): LIPASE, AMYLASE in the last 168 hours. No results for input(s): AMMONIA in the last 168 hours. Coagulation Profile: No results for input(s): INR, PROTIME in the last 168 hours. Cardiac Enzymes: No results for input(s): CKTOTAL, CKMB, CKMBINDEX, TROPONINI in the last 168 hours. BNP (last 3 results) No results for  input(s): PROBNP in the last 8760 hours. HbA1C: No results for input(s): HGBA1C in the last 72 hours. CBG: Recent Labs  Lab 03/11/17 1959 03/11/17 2319 03/12/17 0417 03/12/17 0758 03/12/17 1154  GLUCAP 85 99 82 118* 117*   Lipid Profile: No results for input(s): CHOL, HDL, LDLCALC, TRIG, CHOLHDL, LDLDIRECT in the last 72 hours. Thyroid Function Tests: Recent Labs    03/12/17 0329  TSH 41.499*   Anemia Panel: No results for input(s): VITAMINB12, FOLATE, FERRITIN, TIBC, IRON, RETICCTPCT in the last 72 hours. Urine analysis:    Component Value Date/Time   COLORURINE COLORLESS (A) 03/02/2017 1045   APPEARANCEUR CLEAR 03/02/2017 1045   LABSPEC 1.006 03/02/2017 1045   PHURINE 7.0 03/02/2017 1045   GLUCOSEU NEGATIVE 03/02/2017 1045   HGBUR NEGATIVE 03/02/2017 1045   HGBUR negative 09/16/2009 0954   BILIRUBINUR NEGATIVE 03/02/2017 1045   KETONESUR NEGATIVE 03/02/2017 1045   PROTEINUR NEGATIVE 03/02/2017 1045   UROBILINOGEN 0.2 09/05/2014 2126   NITRITE NEGATIVE 03/02/2017 1045   LEUKOCYTESUR NEGATIVE 03/02/2017 1045   Sepsis Labs: @LABRCNTIP (procalcitonin:4,lacticidven:4) ) No results found for this or any previous visit (from the past 240 hour(s)).       Radiology Studies: No results found.    Scheduled Meds: . ARIPiprazole  20 mg Per Tube Daily  . aspirin  81 mg Per Tube Daily  . atorvastatin  20 mg Per Tube QHS  . baclofen  5 mg Per Tube BID  . chlorhexidine  15 mL Mouth Rinse BID  . Chlorhexidine Gluconate Cloth  6 each Topical Daily  . feeding supplement (PRO-STAT SUGAR FREE 64)  30 mL Per Tube BID  . [START ON 26/11/3417] folic acid  1 mg Per Tube Daily  . free water  200 mL Per Tube Q8H  . heparin  5,000 Units Subcutaneous Q8H  . ipratropium-albuterol  3 mL Nebulization BID  . levETIRAcetam  1,000 mg Per Tube BID  . [START ON 03/13/2017] levothyroxine  100 mcg Per Tube QAC breakfast  . mouth rinse  15 mL Mouth Rinse q12n4p  . sennosides  5 mL Per Tube  BID  . sodium chloride flush  10-40 mL Intracatheter Q12H  . [START ON 03/13/2017] thiamine  100 mg Per Tube Daily  . Valproate Sodium  500 mg Per Tube Q8H  . venlafaxine XR  300 mg Oral Q breakfast   Continuous Infusions: . feeding supplement (JEVITY 1.2 CAL) 1,000 mL (03/12/17 0700)     LOS: 18 days    Time spent in minutes: 35    Debbe Odea, MD [facility] Pager: [email or web address] Password [email or web address] 03/12/2017, 1:56 PM

## 2017-03-12 NOTE — Progress Notes (Signed)
No IV access.  Patient removed new peripheral IV earlier today.  Unsuccessful attempts at keeping safety mitts in place. She has pulled ID bracelet off also.  Will seek order to leave IV out for now.

## 2017-03-12 NOTE — Progress Notes (Signed)
Physical Therapy Treatment Patient Details Name: Dominique Acosta MRN: [id number] DOB: March 30, 1951 Today's Date: 03/12/2017    History of Present Illness Pt is a 66 y.o. female former smoker who was found unresponsive after she called EMS. Patient had agonal respirations and hyperthermia. She required endotracheal intubation for airway protection 11/14. ENT was consulted when she did not have a cuff leak and MRI showed retropharyngeal fluid collection (resolved on CT Neck 11/26). Pt self-extubated on 11/24. Known history of COPD, coronary disease, MI, depression, schizophrenia, psychosis, dystonia, seizure disorder, substance abuse, chronic pain, and hypothyroidism.     PT Comments    Patient is disoriented to time and situation and demonstrated balance and cognitive deficits increasing risk for falls. Pt required min/mod A +2 for safe ambulation and needs max vc for redirection to tasks and to navigate environment. Recommending SNF for further skilled PT services to maximize independence and safety with mobility.     Follow Up Recommendations  SNF     Equipment Recommendations  Other (comment)(To be determined in next venue)    Recommendations for Other Services OT consult     Precautions / Restrictions Precautions Precautions: Fall Restrictions Weight Bearing Restrictions: No    Mobility  Bed Mobility Overal bed mobility: Needs Assistance Bed Mobility: Supine to Sit     Supine to sit: HOB elevated;Min guard     General bed mobility comments: min guard for safety; pt used rail   Transfers Overall transfer level: Needs assistance Equipment used: Rolling walker (2 wheeled) Transfers: Sit to/from Stand Sit to Stand: Min assist;+2 safety/equipment         General transfer comment: assist to power up into standing and cues for safe hand placement; assist to steady upon standing  Ambulation/Gait Ambulation/Gait assistance: Mod assist;Min assist;+2 safety/equipment Ambulation  Distance (Feet): 90 Feet Assistive device: Rolling walker (2 wheeled) Gait Pattern/deviations: Step-through pattern;Decreased step length - right;Decreased step length - left;Drifts right/left;Shuffle Gait velocity: decr   General Gait Details: pt with shuffling gait initially; cues for increased bilat step length and safe use of AD; pt required assistance for balance and to navigate hallway and room as pt tends to run into objects; pt stopped frequently and asked "ok, now what do you want me to do"   Stairs            Wheelchair Mobility    Modified Rankin (Stroke Patients Only)       Balance Overall balance assessment: Needs assistance Sitting-balance support: Feet supported Sitting balance-Leahy Scale: Fair Sitting balance - Comments: supervision for safety   Standing balance support: Bilateral upper extremity supported Standing balance-Leahy Scale: Poor                              Cognition Arousal/Alertness: Awake/alert Behavior During Therapy: Impulsive Overall Cognitive Status: Impaired/Different from baseline Area of Impairment: Orientation;Attention;Memory;Following commands;Safety/judgement;Problem solving                 Orientation Level: Disoriented to;Time;Situation Current Attention Level: Sustained Memory: Decreased short-term memory Following Commands: Follows one step commands inconsistently Safety/Judgement: Decreased awareness of safety;Decreased awareness of deficits Awareness: Intellectual Problem Solving: Difficulty sequencing;Slow processing;Requires verbal cues;Requires tactile cues General Comments: pt required max directional cues and for redirection to task      Exercises      General Comments        Pertinent Vitals/Pain Pain Assessment: Faces Faces Pain Scale: Hurts a little bit Pain Location: "my  side" Pain Descriptors / Indicators: Grimacing Pain Intervention(s): Monitored during session;Repositioned     Home Living                      Prior Function            PT Goals (current goals can now be found in the care plan section) Progress towards PT goals: Progressing toward goals    Frequency    Min 3X/week      PT Plan Discharge plan needs to be updated    Co-evaluation              AM-PAC PT "6 Clicks" Daily Activity  Outcome Measure  Difficulty turning over in bed (including adjusting bedclothes, sheets and blankets)?: None Difficulty moving from lying on back to sitting on the side of the bed? : Unable Difficulty sitting down on and standing up from a chair with arms (e.g., wheelchair, bedside commode, etc,.)?: Unable Help needed moving to and from a bed to chair (including a wheelchair)?: A Little Help needed walking in hospital room?: A Lot Help needed climbing 3-5 steps with a railing? : Total 6 Click Score: 12    End of Session Equipment Utilized During Treatment: Gait belt Activity Tolerance: Patient tolerated treatment well Patient left: in chair;with call bell/phone within reach;with chair alarm set Nurse Communication: Mobility status PT Visit Diagnosis: Other abnormalities of gait and mobility (R26.89);Muscle weakness (generalized) (M62.81)     Time: 7544-9201 PT Time Calculation (min) (ACUTE ONLY): 23 min  Charges:  $Gait Training: 23-37 mins                    G Codes:       Earney Navy, PTA Pager: [phone]     Darliss Cheney 03/12/2017, 3:24 PM

## 2017-03-13 LAB — BASIC METABOLIC PANEL
ANION GAP: 8 (ref 5–15)
BUN: 19 mg/dL (ref 6–20)
CALCIUM: 8.5 mg/dL — AB (ref 8.9–10.3)
CHLORIDE: 98 mmol/L — AB (ref 101–111)
CO2: 32 mmol/L (ref 22–32)
Creatinine, Ser: 0.61 mg/dL (ref 0.44–1.00)
GFR calc Af Amer: >60 mL/min (ref >60)
GFR calc non Af Amer: >60 mL/min (ref >60)
Glucose, Bld: 111 mg/dL — ABNORMAL HIGH (ref 65–99)
Potassium: 3.2 mmol/L — ABNORMAL LOW (ref 3.5–5.1)
SODIUM: 138 mmol/L (ref 135–145)

## 2017-03-13 LAB — PHOSPHORUS: Phosphorus: 3.8 mg/dL (ref 2.5–4.6)

## 2017-03-13 LAB — GLUCOSE, CAPILLARY
GLUCOSE-CAPILLARY: 104 mg/dL — AB (ref 65–99)
GLUCOSE-CAPILLARY: 114 mg/dL — AB (ref 65–99)
GLUCOSE-CAPILLARY: 117 mg/dL — AB (ref 65–99)
GLUCOSE-CAPILLARY: 130 mg/dL — AB (ref 65–99)
Glucose-Capillary: 88 mg/dL (ref 65–99)
Glucose-Capillary: 80 mg/dL (ref 65–99)
Glucose-Capillary: 90 mg/dL (ref 65–99)

## 2017-03-13 LAB — MAGNESIUM: Magnesium: 2.3 mg/dL (ref 1.7–2.4)

## 2017-03-13 MED ORDER — PANCRELIPASE (LIP-PROT-AMYL) 12000-38000 UNITS PO CPEP
12000.0000 [IU] | ORAL_CAPSULE | Freq: Once | ORAL | Status: AC
  Administered 2017-03-13: 12000 [IU] via ORAL
  Filled 2017-03-13: qty 1

## 2017-03-13 MED ORDER — SODIUM BICARBONATE 650 MG PO TABS
650.0000 mg | ORAL_TABLET | Freq: Once | ORAL | Status: AC
  Administered 2017-03-13: 650 mg via ORAL
  Filled 2017-03-13: qty 1

## 2017-03-13 MED ORDER — POTASSIUM CHLORIDE CRYS ER 20 MEQ PO TBCR
40.0000 meq | EXTENDED_RELEASE_TABLET | ORAL | Status: AC
  Administered 2017-03-13 (×2): 40 meq via ORAL
  Filled 2017-03-13 (×2): qty 2

## 2017-03-13 NOTE — Progress Notes (Signed)
Cortrak Tube Team Note:  Page received from RN this morning who reported that tube became clogged this AM. Declogging protocol had not yet been entered or attempted. This afternoon RN paged stating no improvement following the protocol. RD attempted to pass stylet through tube but met resistance at ~10 cm and no techniques were successful in clearing clog. Tube and bridle device removed.   A new 10 F Cortrak tube was placed in the R nare and secured with a nasal tape at 72 cm. Per the Cortrak monitor reading the tube tip is in the stomach. Bridle was attempted x3 but pt adamantly refused further attempts; nose tape used and pt very appreciative.  No x-ray is required. RN may begin using tube.   If the tube becomes dislodged please keep the tube and contact the Cortrak team at [email or web address] (password [email or web address]) for replacement.  If after hours and replacement cannot be delayed, place a NG tube and confirm placement with an abdominal x-ray.      Jarome Matin, MS, RD, LDN, [facility] Inpatient Clinical Dietitian Pager # [phone] After hours/weekend pager # [phone]

## 2017-03-13 NOTE — Progress Notes (Signed)
SLP Cancellation Note  Patient Details Name: Dominique Acosta MRN: [id number] DOB: 11/30/1950   Cancelled treatment:       Reason Eval/Treat Not Completed: Medical issues which prohibited therapy. MBS ordered for today however patient having procedure done 12/4 which may impact swallowing function. Additionally, FEES would be a more appropriate test when ready to re-trial pos in order to visualize the larynx/pharynx. Will follow along via chart review for readiness for repeat instrumental test.  Gabriel Rainwater [place], CCC-SLP [phone]    Gabriel Rainwater Meryl 03/13/2017, 11:34 AM

## 2017-03-13 NOTE — Progress Notes (Signed)
ENT Consult Progress Note  Subjective:  Patient moved to floor, remains on room air. No shortness of breath. Mental status continues to wax and wane with confusion. On NGT but tube was clogged this morning.   Objective: Vitals:   03/13/17 0853 03/13/17 0857  BP: (!) 102/42   Pulse: 81   Resp: 20   Temp: 99 F (37.2 C)   SpO2: 95% 96%    Physical Exam: CONSTITUTIONAL: well developed, nourished, no distress, awake but only partially oriented. Somewhat confused. Pleasant. CARDIOVASCULAR: normal rate and regular rhythm PULMONARY/CHEST WALL: effort normal and no stridor, no stertor, no dysphonia HENT: Head : normocephalic and atraumatic Ears: Right ear:   canal normal, external ear normal and hearing normal Left ear:   canal normal, external ear normal and hearing normal Nose: nose normal and no purulence. NGT right nostril Mouth/Throat:  Mouth: uvula midline and no oral lesions, edentulous Throat: oropharynx clear and moist Mucous membranes: normal EYES: conjunctiva normal, EOM normal and PERRL NECK: supple, trachea normal and no thyromegaly or cervical LAD  Records reviewed Daily progress notes and SLP notes reviewed  Assessment: Dominique Acosta 66 y.o. female who presents with dysphonia upon extubation after having altered mental status. Vocal cord movement is preserved, but there is a developing immediate subglottic stenosis which would benefit from excision and dilation in the operating room.    Plan:  -Will plan for Suspension Microdirect Laryngoscopy with CO laser dilation and balloon dilation, with kenalog injection in the operating room on Tuesday, December 4th with myself and Dr. Redmond Baseman.  -I have called the patient's sister, Dominique Acosta and discussed surgery, risks and benefits. She is in agreement with plan.  -Recommend Pulmicort nebulization for assistance in progression with stenosis.  -Please make patient NPO at midnight tonight with MIVF   Thank you for involving  [facility] in the care of this patient. Should you need further assistance, please call our office at [phone].    Gavin Pound, MD

## 2017-03-13 NOTE — Progress Notes (Signed)
PROGRESS NOTE    Dominique Acosta   MCN:[id number]  DOB: [birth year]  DOA: 02/22/2017 PCP: Lorene Dy, MD   Brief Narrative:  Dominique Acosta is a 66 y/o with COPD, CAD (MI), schizophrenia, dystonia, seizure disorder. She push her life alert button and was later found by EMS to be unresponsive with RR of 4, hypothermic and was intubated in the ER. The patient has a caregiver take care of her during the day. History obtained from sister included a history of abusing her medications. Review of prior chart notes an admission in 6/29 for metabolic encephalopathy Noted to have purulent secretions from ETT and have retropharyngeal edema noted on imaging on 11/26.  Respiratory culture + for MRSA.   Subjective: Wanting to eat. Otherwise, she has no complaints.  ROS: no complaints of nausea, vomiting, constipation diarrhea, cough, dyspnea or dysuria. No other complaints.   Assessment & Plan:   Principal Problem:   MRSA pneumonia ?   Acute respiratory failure with hypoxia  - sputum + for MRSA but never had infiltrates on CXR and procalcitonin was negative  ? If respiratory failure was due to something else ie improperly taking her medications and/or viral bronchitis- ? 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 - ENT plans of excision and dilatation in OR on Tues 12/4 - pulmicort nebs recommended by ENT    Hypothyroid  - 11/15 TSH=51.3 -note that TSH was 43 in 4/18 and 62.1 in 5/18 (prior admissions) as well and she was suspected to be non-compliant - cont synthroid and follow closely  Hypokalemia on admission - K 2.7  - adequately replaced - low again today- replace again and follow    Acute metabolic encephalopathy - presented with encephalopathy in 4/18 as  well - somewhat confused still- may have had permanent damage due to respiratory arrest - has received daily IV Thiamine daily and is now oral - B12 normal - attempting to make sure we have on the correct home (psych) meds to help her confusion to resolve     Seizure disorder - episode of seizure vs "posturing"- has h/o dystonia- ? Overdose of neuroleptics - Neuro has been following - Keppra and Depakote started this admission - continuous EEG - nonictal   Dystonia - on Baclofen- continue  Schizophrenia - cont Abilify - 12/1 resumed Effexor   Triple lumen - have asked RN to obtain peripheral IVs and remove Triple lumen- IV was placed but patient pulled it out   Nutrition - Jevity- d/c D5 infusion and start free water   DVT prophylaxis: heparin Code Status: no CPR, shocks or ACLS meds Family Communication:  Disposition Plan: follow on telemetry Consultants:   PCCM  Neurology Procedures:  11/15 CONTINUOUS EEG : moderatelyabnormal continuous video EEG due to loss of normal background, diffuse sharply contoured slowing(resolved), and a partial burst-suppression pattern. This was indicative of an improvingglobal cerebral disturbance.No seizuresor definite epileptiform dischargeswere seen, however sharp activity was present early in the record.  11/28 CorTrak    Antimicrobials:  Anti-infectives (From admission, onward)   Start     Dose/Rate Route Frequency Ordered Stop   02/24/17 1200  levofloxacin (LEVAQUIN) IVPB 750 mg  Status:  Discontinued     750 mg 100 mL/hr over 90 Minutes Intravenous Every 48 hours 02/22/17  1435 02/23/17 1452   02/24/17 0900  ceFEPIme (MAXIPIME) 2 g in dextrose 5 % 50 mL IVPB  Status:  Discontinued     2 g 100 mL/hr over 30 Minutes Intravenous Every 12 hours 02/24/17 0830 02/26/17 1456   02/24/17 0500  vancomycin (VANCOCIN) IVPB 750 mg/150 ml premix     750 mg 150 mL/hr over 60 Minutes Intravenous Every 12 hours 02/23/17 1500 03/03/17  1704   02/23/17 2030  ceFEPIme (MAXIPIME) 2 g in dextrose 5 % 50 mL IVPB  Status:  Discontinued     2 g 100 mL/hr over 30 Minutes Intravenous Every 24 hours 02/23/17 2013 02/24/17 0830   02/23/17 1230  vancomycin (VANCOCIN) 1,250 mg in sodium chloride 0.9 % 250 mL IVPB  Status:  Discontinued     1,250 mg 166.7 mL/hr over 90 Minutes Intravenous Every 24 hours 02/22/17 1435 02/23/17 1500   02/22/17 2300  aztreonam (AZACTAM) 1 g in dextrose 5 % 50 mL IVPB  Status:  Discontinued     1 g 100 mL/hr over 30 Minutes Intravenous Every 8 hours 02/22/17 1435 02/23/17 1452   02/22/17 1500  aztreonam (AZACTAM) 2 g in dextrose 5 % 50 mL IVPB     2 g 100 mL/hr over 30 Minutes Intravenous  Once 02/22/17 1444 02/22/17 1957   02/22/17 1130  vancomycin (VANCOCIN) 2,000 mg in sodium chloride 0.9 % 500 mL IVPB     2,000 mg 250 mL/hr over 120 Minutes Intravenous  Once 02/22/17 1122 02/22/17 1802   02/22/17 1100  levofloxacin (LEVAQUIN) IVPB 750 mg     750 mg 100 mL/hr over 90 Minutes Intravenous  Once 02/22/17 1046 02/22/17 1309   02/22/17 1100  aztreonam (AZACTAM) 2 g in dextrose 5 % 50 mL IVPB  Status:  Discontinued     2 g 100 mL/hr over 30 Minutes Intravenous  Once 02/22/17 1046 02/22/17 1444   02/22/17 1100  vancomycin (VANCOCIN) IVPB 1000 mg/200 mL premix  Status:  Discontinued     1,000 mg 200 mL/hr over 60 Minutes Intravenous  Once 02/22/17 1046 02/22/17 1122       Objective: Vitals:   03/13/17 0500 03/13/17 0853 03/13/17 0857 03/13/17 1414  BP: 95/64 (!) 102/42  (!) 117/53  Pulse: 87 81  83  Resp: 20 20  20   Temp: 98.4 F (36.9 C) 99 F (37.2 C)  98.7 F (37.1 C)  TempSrc: Oral Oral  Oral  SpO2: 99% 95% 96% 97%  Weight: 85 kg (187 lb 6.3 oz)     Height:        Intake/Output Summary (Last 24 hours) at 03/13/2017 1523 Last data filed at 03/13/2017 1400 Gross per 24 hour  Intake 1535 ml  Output 700 ml  Net 835 ml   Filed Weights   03/11/17 0458 03/12/17 0420 03/13/17 0500  Weight:  86.7 kg (191 lb 2.2 oz) 83 kg (182 lb 15.7 oz) 85 kg (187 lb 6.3 oz)    Examination: General exam: Appears comfortable  HEENT: PERRLA, oral mucosa moist, no sclera icterus or thrush Respiratory system: Clear to auscultation. Respiratory effort normal. Cardiovascular system: S1 & S2 heard, RRR.  No murmurs  Gastrointestinal system: Abdomen soft, non-tender, nondistended. Normal bowel sound. No organomegaly Central nervous system: Alert- disoriented to year other wise knows month and day- No focal neurological deficits. Extremities: No cyanosis, clubbing or edema Skin: No rashes or ulcers Psychiatry:  Mood  Appropriate     Data Reviewed: I have personally  reviewed following labs and imaging studies  CBC: Recent Labs  Lab 03/07/17 0331 03/08/17 0530 03/09/17 0500  WBC 11.8* 10.6* 11.4*  NEUTROABS  --   --  6.7  HGB 9.0* 8.5* 8.3*  HCT 27.8* 25.7* 25.8*  MCV 97.2 99.2 98.5  PLT 298 338 497   Basic Metabolic Panel: Recent Labs  Lab 03/09/17 0500 03/09/17 1555 03/10/17 0549 03/10/17 0728 03/11/17 0507 03/12/17 0329 03/13/17 0523  NA 135  --  136  --  140 139 138  K 2.9* 3.5 3.0*  --  3.7 3.8 3.2*  CL 96*  --  98*  --  101 99* 98*  CO2 31  --  31  --  32 30 32  GLUCOSE 102*  --  130*  --  107* 89 111*  BUN 8  --  12  --  11 13 19   CREATININE 0.67  --  0.77  --  0.59 0.66 0.61  CALCIUM 8.1*  --  8.4*  --  8.6* 8.6* 8.5*  MG 2.2 2.1 2.1  --  2.2 2.2 2.3  PHOS 2.2* 2.4*  --  3.2 3.0 3.2 3.8   GFR: Estimated Creatinine Clearance: 67.5 mL/min (by C-G formula based on SCr of 0.61 mg/dL). Liver Function Tests: No results for input(s): AST, ALT, ALKPHOS, BILITOT, PROT, ALBUMIN in the last 168 hours. No results for input(s): LIPASE, AMYLASE in the last 168 hours. No results for input(s): AMMONIA in the last 168 hours. Coagulation Profile: No results for input(s): INR, PROTIME in the last 168 hours. Cardiac Enzymes: No results for input(s): CKTOTAL, CKMB, CKMBINDEX,  TROPONINI in the last 168 hours. BNP (last 3 results) No results for input(s): PROBNP in the last 8760 hours. HbA1C: No results for input(s): HGBA1C in the last 72 hours. CBG: Recent Labs  Lab 03/12/17 2021 03/13/17 0046 03/13/17 0448 03/13/17 0825 03/13/17 1114  GLUCAP 97 114* 104* 88 80   Lipid Profile: No results for input(s): CHOL, HDL, LDLCALC, TRIG, CHOLHDL, LDLDIRECT in the last 72 hours. Thyroid Function Tests: Recent Labs    03/12/17 0329  TSH 41.499*   Anemia Panel: No results for input(s): VITAMINB12, FOLATE, FERRITIN, TIBC, IRON, RETICCTPCT in the last 72 hours. Urine analysis:    Component Value Date/Time   COLORURINE COLORLESS (A) 03/02/2017 1045   APPEARANCEUR CLEAR 03/02/2017 1045   LABSPEC 1.006 03/02/2017 1045   PHURINE 7.0 03/02/2017 1045   GLUCOSEU NEGATIVE 03/02/2017 1045   HGBUR NEGATIVE 03/02/2017 1045   HGBUR negative 09/16/2009 0954   BILIRUBINUR NEGATIVE 03/02/2017 1045   KETONESUR NEGATIVE 03/02/2017 1045   PROTEINUR NEGATIVE 03/02/2017 1045   UROBILINOGEN 0.2 09/05/2014 2126   NITRITE NEGATIVE 03/02/2017 1045   LEUKOCYTESUR NEGATIVE 03/02/2017 1045   Sepsis Labs: @LABRCNTIP (procalcitonin:4,lacticidven:4) ) No results found for this or any previous visit (from the past 240 hour(s)).       Radiology Studies: No results found.    Scheduled Meds: . ARIPiprazole  20 mg Per Tube Daily  . aspirin  81 mg Per Tube Daily  . atorvastatin  20 mg Per Tube QHS  . baclofen  5 mg Per Tube BID  . chlorhexidine  15 mL Mouth Rinse BID  . Chlorhexidine Gluconate Cloth  6 each Topical Daily  . feeding supplement (PRO-STAT SUGAR FREE 64)  30 mL Per Tube BID  . folic acid  1 mg Per Tube Daily  . free water  200 mL Per Tube Q8H  . heparin  5,000 Units Subcutaneous  Q8H  . ipratropium-albuterol  3 mL Nebulization BID  . levETIRAcetam  1,000 mg Per Tube BID  . levothyroxine  100 mcg Per Tube QAC breakfast  . mouth rinse  15 mL Mouth Rinse q12n4p    . potassium chloride  40 mEq Oral Q4H  . sennosides  5 mL Per Tube BID  . sodium chloride flush  10-40 mL Intracatheter Q12H  . thiamine  100 mg Per Tube Daily  . Valproate Sodium  500 mg Per Tube Q8H  . venlafaxine XR  300 mg Oral Q breakfast   Continuous Infusions: . feeding supplement (JEVITY 1.2 CAL) 1,000 mL (03/12/17 1807)     LOS: 19 days    Time spent in minutes: 35    Debbe Odea, MD [facility] Pager: [email or web address] Password [email or web address] 03/13/2017, 3:23 PM

## 2017-03-14 ENCOUNTER — Inpatient Hospital Stay (HOSPITAL_COMMUNITY): Payer: Medicare Other | Admitting: Anesthesiology

## 2017-03-14 ENCOUNTER — Encounter (HOSPITAL_COMMUNITY): Payer: Self-pay | Admitting: Certified Registered Nurse Anesthetist

## 2017-03-14 ENCOUNTER — Encounter (HOSPITAL_COMMUNITY)
Admission: EM | Disposition: A | Discharge status: Skilled Nursing Facility | Payer: Self-pay | Source: Home / Self Care | Attending: Pulmonary Disease

## 2017-03-14 HISTORY — PX: BALLOON DILATION: SHX5330

## 2017-03-14 HISTORY — PX: MICROLARYNGOSCOPY WITH LASER: SHX5972

## 2017-03-14 HISTORY — PX: KENALOG INJECTION: SHX5298

## 2017-03-14 LAB — GLUCOSE, CAPILLARY
GLUCOSE-CAPILLARY: 94 mg/dL (ref 65–99)
GLUCOSE-CAPILLARY: 126 mg/dL — AB (ref 65–99)
GLUCOSE-CAPILLARY: 114 mg/dL — AB (ref 65–99)
Glucose-Capillary: 81 mg/dL (ref 65–99)
Glucose-Capillary: 91 mg/dL (ref 65–99)

## 2017-03-14 LAB — MAGNESIUM: Magnesium: 2.2 mg/dL (ref 1.7–2.4)

## 2017-03-14 LAB — PHOSPHORUS: Phosphorus: 3.3 mg/dL (ref 2.5–4.6)

## 2017-03-14 SURGERY — MICROLARYNGOSCOPY, WITH PROCEDURE USING LASER
Anesthesia: General | Site: Throat

## 2017-03-14 MED ORDER — EPINEPHRINE HCL (NASAL) 0.1 % NA SOLN
NASAL | Status: AC
  Filled 2017-03-14: qty 30

## 2017-03-14 MED ORDER — LIDOCAINE HCL (PF) 4 % IJ SOLN
INTRAMUSCULAR | Status: AC
  Filled 2017-03-14: qty 5

## 2017-03-14 MED ORDER — FENTANYL CITRATE (PF) 100 MCG/2ML IJ SOLN
INTRAMUSCULAR | Status: AC
  Filled 2017-03-14: qty 2

## 2017-03-14 MED ORDER — PROPOFOL 10 MG/ML IV BOLUS
INTRAVENOUS | Status: AC
  Filled 2017-03-14: qty 20

## 2017-03-14 MED ORDER — PHENYLEPHRINE 40 MCG/ML (10ML) SYRINGE FOR IV PUSH (FOR BLOOD PRESSURE SUPPORT)
PREFILLED_SYRINGE | INTRAVENOUS | Status: AC
  Filled 2017-03-14: qty 10

## 2017-03-14 MED ORDER — FENTANYL CITRATE (PF) 100 MCG/2ML IJ SOLN
25.0000 ug | INTRAMUSCULAR | Status: DC | PRN
  Administered 2017-03-14: 25 ug via INTRAVENOUS

## 2017-03-14 MED ORDER — 0.9 % SODIUM CHLORIDE (POUR BTL) OPTIME
TOPICAL | Status: DC | PRN
  Administered 2017-03-14: 1000 mL

## 2017-03-14 MED ORDER — PHENYLEPHRINE HCL 10 MG/ML IJ SOLN
INTRAMUSCULAR | Status: DC | PRN
  Administered 2017-03-14: 120 ug via INTRAVENOUS

## 2017-03-14 MED ORDER — PHENYLEPHRINE HCL 10 MG/ML IJ SOLN
INTRAMUSCULAR | Status: DC | PRN
  Administered 2017-03-14: 40 ug/min via INTRAVENOUS

## 2017-03-14 MED ORDER — SUGAMMADEX SODIUM 500 MG/5ML IV SOLN
INTRAVENOUS | Status: DC | PRN
  Administered 2017-03-14: 500 mg via INTRAVENOUS

## 2017-03-14 MED ORDER — FENTANYL CITRATE (PF) 250 MCG/5ML IJ SOLN
INTRAMUSCULAR | Status: AC
  Filled 2017-03-14: qty 5

## 2017-03-14 MED ORDER — VALPROATE SODIUM 500 MG/5ML IV SOLN
500.0000 mg | Freq: Three times a day (TID) | INTRAVENOUS | Status: DC
  Administered 2017-03-15 – 2017-03-16 (×5): 500 mg via INTRAVENOUS
  Filled 2017-03-14 (×6): qty 5

## 2017-03-14 MED ORDER — LIDOCAINE HCL (CARDIAC) 20 MG/ML IV SOLN
INTRAVENOUS | Status: DC | PRN
  Administered 2017-03-14: 60 mg via INTRAVENOUS

## 2017-03-14 MED ORDER — TRIAMCINOLONE ACETONIDE 40 MG/ML IJ SUSP
INTRAMUSCULAR | Status: DC | PRN
  Administered 2017-03-14: .7 mL

## 2017-03-14 MED ORDER — ROCURONIUM BROMIDE 100 MG/10ML IV SOLN
INTRAVENOUS | Status: DC | PRN
  Administered 2017-03-14: 40 mg via INTRAVENOUS

## 2017-03-14 MED ORDER — PROPOFOL 10 MG/ML IV BOLUS
INTRAVENOUS | Status: DC | PRN
  Administered 2017-03-14: 70 mg via INTRAVENOUS

## 2017-03-14 MED ORDER — TRIAMCINOLONE ACETONIDE 40 MG/ML IJ SUSP
INTRAMUSCULAR | Status: AC
  Filled 2017-03-14: qty 5

## 2017-03-14 MED ORDER — PROPOFOL 500 MG/50ML IV EMUL
INTRAVENOUS | Status: DC | PRN
  Administered 2017-03-14: 100 ug/kg/min via INTRAVENOUS

## 2017-03-14 MED ORDER — FENTANYL CITRATE (PF) 100 MCG/2ML IJ SOLN
INTRAMUSCULAR | Status: DC | PRN
  Administered 2017-03-14: 25 ug via INTRAVENOUS

## 2017-03-14 MED ORDER — SODIUM CHLORIDE 0.9 % IV SOLN
1000.0000 mg | Freq: Two times a day (BID) | INTRAVENOUS | Status: DC
  Administered 2017-03-14 – 2017-03-16 (×4): 1000 mg via INTRAVENOUS
  Filled 2017-03-14 (×4): qty 10

## 2017-03-14 MED ORDER — MIDAZOLAM HCL 2 MG/2ML IJ SOLN
INTRAMUSCULAR | Status: AC
  Filled 2017-03-14: qty 2

## 2017-03-14 MED ORDER — EPINEPHRINE 30 MG/30ML IJ SOLN
INTRAMUSCULAR | Status: DC | PRN
  Administered 2017-03-14: 1 mg

## 2017-03-14 MED ORDER — DEXAMETHASONE SODIUM PHOSPHATE 10 MG/ML IJ SOLN
INTRAMUSCULAR | Status: DC | PRN
  Administered 2017-03-14: 10 mg via INTRAVENOUS

## 2017-03-14 MED ORDER — ONDANSETRON HCL 4 MG/2ML IJ SOLN
INTRAMUSCULAR | Status: DC | PRN
  Administered 2017-03-14: 4 mg via INTRAVENOUS

## 2017-03-14 MED ORDER — LACTATED RINGERS IV SOLN
INTRAVENOUS | Status: DC | PRN
  Administered 2017-03-14: 08:00:00 via INTRAVENOUS

## 2017-03-14 MED ORDER — ONDANSETRON HCL 4 MG/2ML IJ SOLN
INTRAMUSCULAR | Status: AC
  Filled 2017-03-14: qty 2

## 2017-03-14 SURGICAL SUPPLY — 23 items
BALLN PULM 15 16.5 18X75 (BALLOONS) ×2
BALLOON PULM 15 16.5 18X75 (BALLOONS) IMPLANT
BANDAGE EYE OVAL (MISCELLANEOUS) ×4 IMPLANT
CANISTER SUCT 3000ML PPV (MISCELLANEOUS) ×2 IMPLANT
CONT SPEC 4OZ CLIKSEAL STRL BL (MISCELLANEOUS) ×1 IMPLANT
COVER BACK TABLE 60X90IN (DRAPES) ×2 IMPLANT
COVER MAYO STAND STRL (DRAPES) ×2 IMPLANT
DRAPE HALF SHEET 40X57 (DRAPES) ×2 IMPLANT
GAUZE SPONGE 4X4 12PLY STRL LF (GAUZE/BANDAGES/DRESSINGS) IMPLANT
GAUZE SPONGE 4X4 16PLY XRAY LF (GAUZE/BANDAGES/DRESSINGS) ×2 IMPLANT
GLOVE BIO SURGEON STRL SZ 6.5 (GLOVE) ×2 IMPLANT
GUARD TEETH (MISCELLANEOUS) IMPLANT
KIT BASIN OR (CUSTOM PROCEDURE TRAY) ×2 IMPLANT
KIT ROOM TURNOVER OR (KITS) ×2 IMPLANT
NS IRRIG 1000ML POUR BTL (IV SOLUTION) ×2 IMPLANT
PAD ARMBOARD 7.5X6 YLW CONV (MISCELLANEOUS) ×4 IMPLANT
PATTIES SURGICAL .5 X1 (DISPOSABLE) ×1 IMPLANT
PATTIES SURGICAL .5X1.5 (GAUZE/BANDAGES/DRESSINGS) ×2 IMPLANT
SOLUTION ANTI FOG 6CC (MISCELLANEOUS) ×1 IMPLANT
SURGILUBE 2OZ TUBE FLIPTOP (MISCELLANEOUS) IMPLANT
TOWEL NATURAL 6PK STERILE (DISPOSABLE) ×2 IMPLANT
TUBE CONNECTING 12X1/4 (SUCTIONS) ×2 IMPLANT
WATER STERILE IRR 1000ML POUR (IV SOLUTION) ×2 IMPLANT

## 2017-03-14 NOTE — Progress Notes (Signed)
[facility] informed that patient has pulled her cortack feeding tube out. Arthor Captain LPN

## 2017-03-14 NOTE — Anesthesia Postprocedure Evaluation (Signed)
Anesthesia Post Note  Patient: Zehra Rucci  Procedure(s) Performed: MICROLARYNGOSCOPY WITH LASER (N/A Throat) KENALOG INJECTION (N/A Throat) BALLOON DILATION (N/A Throat)     Patient location during evaluation: PACU Anesthesia Type: General Level of consciousness: awake and sedated Pain management: pain level controlled Vital Signs Assessment: post-procedure vital signs reviewed and stable Respiratory status: spontaneous breathing, nonlabored ventilation, respiratory function stable and patient connected to nasal cannula oxygen Cardiovascular status: blood pressure returned to baseline and stable Postop Assessment: no apparent nausea or vomiting Anesthetic complications: no    Last Vitals:  Vitals:   03/14/17 0945 03/14/17 0956  BP: (!) 112/59 (!) 103/57  Pulse: 78 80  Resp: 12 16  Temp:  36.8 C  SpO2: 94% 93%    Last Pain:  Vitals:   03/14/17 0956  TempSrc: Axillary  PainSc: Asleep                 Larayne Baxley,JAMES TERRILL

## 2017-03-14 NOTE — Progress Notes (Signed)
PT Cancellation Note  Patient Details Name: Dominique Acosta MRN: [id number] DOB: Jul 21, 1950   Cancelled Treatment:    Reason Eval/Treat Not Completed: Patient at procedure or test/unavailable Pt off floor in OR. Will follow.   [name] A [name] 03/14/2017, 8:16 AM Wray Kearns, [place], DPT [phone]

## 2017-03-14 NOTE — Progress Notes (Signed)
Occupational Therapy Treatment Patient Details Name: Dominique Acosta MRN: [id number] DOB: Jan 07, 1951 Today's Date: 03/14/2017    History of present illness Pt is a 66 y.o. female former smoker who was found unresponsive. Found to have agonal respirations and hyperthermia. Intubated 11/14-11/24 (self extubated). MRI showed retropharyngeal fluid collection (resolved on CT Neck 11/26). PMH of COPD, coronary disease, MI, depression, schizophrenia, psychosis, dystonia, seizure disorder, substance abuse, chronic pain, and hypothyroidism. s/p MICROLARYNGOSCOPY WITH LASER of throat and balloon dilation.   OT comments  Pt progressing towards established OT goals. Pt performing grooming at sink with Min A for balance and Mod VCs for attention. Pt perseverating on dc topics including stating "they are going to evict me." Pt continues to present with poor balance and cognition. Continue to recommend dc to post-acute rehab to optimize safety and independence with ADLs as well as decrease caregiver burden. Will continue to follow as admitted.   Follow Up Recommendations  CIR;Supervision/Assistance - 24 hour    Equipment Recommendations  3 in 1 bedside commode    Recommendations for Other Services Rehab consult    Precautions / Restrictions Precautions Precautions: Fall Restrictions Weight Bearing Restrictions: No       Mobility Bed Mobility Overal bed mobility: Needs Assistance Bed Mobility: Rolling;Sidelying to Sit Rolling: Min guard Sidelying to sit: Min guard;HOB elevated   Sit to supine: Min guard   General bed mobility comments: no assist needed, heavy use of rail. Able to bring LEs into bed but assist to reposition self higher in bed.   Transfers Overall transfer level: Needs assistance Equipment used: Rolling walker (2 wheeled) Transfers: Sit to/from Stand Sit to Stand: Min guard Stand pivot transfers: Min assist;+2 physical assistance       General transfer comment: Close min  guard for safety. Pt pulling up on RW handles despite cues. Stood from [employer], from chair x2.     Balance Overall balance assessment: Needs assistance Sitting-balance support: Feet supported;No upper extremity supported Sitting balance-Leahy Scale: Good Sitting balance - Comments: Able to reach outside BOS and donn socks without difficulty.    Standing balance support: During functional activity Standing balance-Leahy Scale: Fair Standing balance comment: Able to stand at sink and manipulate toothbrush and toothpaste without UE support but close Min guard needed.                            ADL either performed or assessed with clinical judgement   ADL Overall ADL's : Needs assistance/impaired     Grooming: Wash/dry hands;Wash/dry face;Oral care;Brushing hair;Minimal assistance;Standing Grooming Details (indicate cue type and reason): Min A for standing balance at sink. Increased cues for attention to maintain on task until finished compeltely             Lower Body Dressing: Min guard Lower Body Dressing Details (indicate cue type and reason): Pt donned socks at EOB with Min guard for safety by bringing ankles to knees             Functional mobility during ADLs: Minimal assistance;Moderate assistance General ADL Comments: Pt continues to present with poor balance and cognition     Vision       Perception     Praxis      Cognition Arousal/Alertness: Awake/alert Behavior During Therapy: Impulsive Overall Cognitive Status: Impaired/Different from baseline Area of Impairment: Attention;Safety/judgement;Awareness;Problem solving;Following commands;Memory  Current Attention Level: Selective Memory: Decreased short-term memory Following Commands: Follows one step commands consistently Safety/Judgement: Decreased awareness of safety;Decreased awareness of deficits Awareness: Intellectual Problem Solving: Difficulty sequencing;Slow  processing;Requires verbal cues;Requires tactile cues General Comments: Impulsive. Perseverating on worried about being evicted from her home. Stating things that were not related to topic. "you know those i've fallen and I cannot get up," yea that is in here now (pointing to her throat). Pt requiring increased time for problem solving and increased cues to maintain attention on ADL tasks.         Exercises     Shoulder Instructions       General Comments SpO2 dropping to 85% on RA during funcitonal mobility    Pertinent Vitals/ Pain       Pain Assessment: Faces Faces Pain Scale: Hurts a little bit Pain Location: head Pain Descriptors / Indicators: Headache Pain Intervention(s): Monitored during session  Home Living                                          Prior Functioning/Environment              Frequency  Min 2X/week        Progress Toward Goals  OT Goals(current goals can now be found in the care plan section)  Progress towards OT goals: Progressing toward goals  Acute Rehab OT Goals OT Goal Formulation: Patient unable to participate in goal setting Time For Goal Achievement: 03/23/17 Potential to Achieve Goals: Good ADL Goals Pt Will Perform Eating: with modified independence;sitting Pt Will Perform Grooming: with min guard assist;standing Pt Will Perform Upper Body Bathing: with supervision Pt Will Perform Lower Body Bathing: with min assist;sit to/from stand Pt Will Transfer to Toilet: with min assist;ambulating;regular height toilet;bedside commode;grab bars Pt Will Perform Toileting - Clothing Manipulation and hygiene: with min guard assist;sit to/from stand Additional ADL Goal #1: Pt will sustain attention to familiar ADL activity x 4 mins with min cues.  Plan Discharge plan remains appropriate    Co-evaluation    PT/OT/SLP Co-Evaluation/Treatment: Yes Reason for Co-Treatment: Complexity of the patient's impairments (multi-system  involvement);For patient/therapist safety PT goals addressed during session: Mobility/safety with mobility OT goals addressed during session: ADL's and self-care      AM-PAC PT "6 Clicks" Daily Activity     Outcome Measure   Help from another person eating meals?: A Little Help from another person taking care of personal grooming?: A Little Help from another person toileting, which includes using toliet, bedpan, or urinal?: A Lot Help from another person bathing (including washing, rinsing, drying)?: A Lot Help from another person to put on and taking off regular upper body clothing?: A Lot Help from another person to put on and taking off regular lower body clothing?: A Lot 6 Click Score: 14    End of Session Equipment Utilized During Treatment: Gait belt;Rolling walker  OT Visit Diagnosis: Unsteadiness on feet (R26.81);Cognitive communication deficit (R41.841)   Activity Tolerance Other (comment);Patient tolerated treatment well(limited by cognition )   Patient Left in bed;with call bell/phone within reach;with bed alarm set   Nurse Communication Mobility status        Time: 1400-1426 OT Time Calculation (min): 26 min  Charges: OT General Charges $OT Visit: 1 Visit OT Treatments $Self Care/Home Management : 8-22 mins  [place], OTR/L Acute Rehab Pager: [phone] Office: [phone]  [place] 03/14/2017, 5:54 PM

## 2017-03-14 NOTE — Progress Notes (Signed)
OT Cancellation    03/14/17 0900  OT Visit Information  Last OT Received On 03/14/17  Reason Eval/Treat Not Completed Patient at procedure or test/ unavailable (Pt off floor in OR. Will return as schedule allows.)   Deamonte Sayegh MSOT, OTR/L Acute Rehab Pager: [phone] Office: [phone]

## 2017-03-14 NOTE — Op Note (Signed)
DATE OF PROCEDURE:  03/14/2017    PRE-OPERATIVE DIAGNOSIS:  SUBGLOTTIC STENOSIS    POST-OPERATIVE DIAGNOSIS:  Same   PROCEDURE(S):  Suspension microdirect laryngoscopy with operating telescope CO2 laser excision of subglottic stenosis Balloon dilation of subglottic stenosis Kenalog injection into the subglottis    SURGEON:  Helayne Seminole, MD    ASSISTANT(S):  Melida Quitter, MD    ANESTHESIA:  Jet venturi ventilation with general anesthesia  ESTIMATED BLOOD LOSS:  71mL    SPECIMENS:  Subglottic lesion     COMPLICATIONS:  None     OPERATIVE FINDINGS: Easy mask with oral airway. Very straightforward laryngeal exposure with Dedo laryngoscope.  There was a 40% immediate subglottic stenosis with an hourglass shape to it, with the scar touching about midlength of the trachea. There did not appear to be a posterior stenosis. There was some exophytic-appearing debris bilaterally in the immediate subglottis.      OPERATIVE DETAILS: After being properly identified in the preoperative holding area, the patient was brought into the operating suite.  She was placed supine on the operating table.  A pre-procedural time-out was performed. Pre-operative steroids were administered.  After induction of general anesthesia, the patient was easily bag-mask ventilated.  Eye-pads, eye-tape, and a gauze over the gums were placed. Paralytics were then administered and the Dedo laryngoscope was brought to the field and the larynx exposed. The tip of the laryngoscope passed through the vocal folds and jet ventilation was initiated. The patient was then placed in suspension using the fulcrum suspension system secured to the Mayo stand.  The trachea was examined with a 0-degree telescope and photo documentation obtained. The patient's face was protected with a moist towel. The CO2 laser was brought to the field, programed for Ultra Pulse, 113mJ, 4 watts, and radial incisions were  made at 12, 7, 2 o'clock positions. The scar was then injected with kenalog 40mg  (0.54mL). The controlled radial expansion balloon was then brought to the field and the patient was dilated first to 15 then 16.5 then 18 mm of balloon diameter with each held for 30 seconds. Following the final dilation, the balloon was removed. The cross sectional area of the trachea was significantly improved. Postoperative photographs were obtained.   The patient was released from suspension and the vocal folds were sprayed with 4% lidocaine. The hypopharynx was suctioned free of all accumulated secretions. The patient was then mask ventilated until breathing spontaneously. She was transferred stable to the recovery room having tolerated the procedure well. At the end of the case, the instrument, sponge and needle counts were correct. The patient tolerated the procedure well.

## 2017-03-14 NOTE — Progress Notes (Signed)
[facility] notified that [facility] needs to IV route. Arthor Captain LPN

## 2017-03-14 NOTE — Plan of Care (Signed)
  Education: Knowledge of General Education information will improve 03/14/2017 0251 - Progressing by Anson Fret, RN Note POC reviewed with pt.; explained to pt. about safety mitts being on to keep her from pulling at [facility]; not sure how much pt. understands.

## 2017-03-14 NOTE — Anesthesia Preprocedure Evaluation (Addendum)
Anesthesia Evaluation  Patient identified by MRN, date of birth, ID band Patient awake    Reviewed: Allergy & Precautions, NPO status , Patient's Chart, lab work & pertinent test results  Airway Mallampati: III  TM Distance: <3 FB Neck ROM: Limited    Dental  (+) Edentulous Upper, Edentulous Lower   Pulmonary COPD, former smoker,     + decreased breath sounds      Cardiovascular hypertension, + CAD   Rhythm:Regular Rate:Normal     Neuro/Psych Seizures -,     GI/Hepatic GERD  ,  Endo/Other  Hypothyroidism   Renal/GU Renal InsufficiencyRenal disease     Musculoskeletal   Abdominal (+) + obese,   Peds  Hematology   Anesthesia Other Findings   Reproductive/Obstetrics                            Anesthesia Physical Anesthesia Plan  ASA: IV  Anesthesia Plan: General   Post-op Pain Management:    Induction: Intravenous  PONV Risk Score and Plan: 4 or greater and Treatment may vary due to age or medical condition, Ondansetron and Dexamethasone  Airway Management Planned: Oral ETT  Additional Equipment:   Intra-op Plan:   Post-operative Plan: Extubation in OR and Possible Post-op intubation/ventilation  Informed Consent: I have reviewed the patients History and Physical, chart, labs and discussed the procedure including the risks, benefits and alternatives for the proposed anesthesia with the patient or authorized representative who has indicated his/her understanding and acceptance.     Plan Discussed with: CRNA  Anesthesia Plan Comments:         Anesthesia Quick Evaluation

## 2017-03-14 NOTE — H&P (Signed)
The surgical history remains accurate and without interval change. The condition still exists which makes the procedure necessary. The patient and/or family is aware of their condition and has been informed of the risks and benefits of surgery, as well as alternatives. All parties have elected to proceed with surgery.   Surgical plan:  Jet ventilation with direct laryngoscopy with suspension, CO2 laser excision of subglottis stenosis, balloon dilation, and kenalog injection.   Helayne Seminole, MD

## 2017-03-14 NOTE — Progress Notes (Signed)
Physical Therapy Treatment Patient Details Name: Dominique Acosta MRN: [id number] DOB: 01-05-1951 Today's Date: 03/14/2017    History of Present Illness Pt is a 66 y.o. female former smoker who was found unresponsive. Found to have agonal respirations and hyperthermia. Intubated 11/14-11/24 (self extubated). MRI showed retropharyngeal fluid collection (resolved on CT Neck 11/26). PMH of COPD, coronary disease, MI, depression, schizophrenia, psychosis, dystonia, seizure disorder, substance abuse, chronic pain, and hypothyroidism. s/p MICROLARYNGOSCOPY WITH LASER of throat and balloon dilation.    PT Comments    Patient progressing well towards PT goals. Tolerated gait training with Min A for balance/safety secondary to staggering and instability. Difficult to get Sp02 reading but guessing it was high 80s at one point. Pt with poor safety awareness, judgment and insight. Pt with nonsensical speech unrelated to questions asked.  Not safe to return home. Will follow.   Follow Up Recommendations  SNF     Equipment Recommendations  None recommended by PT    Recommendations for Other Services       Precautions / Restrictions Precautions Precautions: Fall Restrictions Weight Bearing Restrictions: No    Mobility  Bed Mobility Overal bed mobility: Needs Assistance Bed Mobility: Rolling;Sidelying to Sit Rolling: Min guard Sidelying to sit: Min guard;HOB elevated   Sit to supine: Min guard   General bed mobility comments: no assist needed, heavy use of rail. Able to bring LEs into bed but assist to reposition self higher in bed.   Transfers Overall transfer level: Needs assistance Equipment used: Rolling walker (2 wheeled) Transfers: Sit to/from Stand Sit to Stand: Min guard         General transfer comment: Close min guard for safety. Pt pulling up on RW handles despite cues. Stood from [employer], from chair x2.   Ambulation/Gait Ambulation/Gait assistance: Min assist Ambulation  Distance (Feet): 75 Feet(+75') Assistive device: Rolling walker (2 wheeled) Gait Pattern/deviations: Step-through pattern;Decreased step length - right;Decreased step length - left;Drifts right/left;Shuffle;Staggering right;Staggering left Gait velocity: decr Gait velocity interpretation: Below normal speed for age/gender General Gait Details: Slow, unsteady gait with pt staggering to right/left with RW. Min A for balance. Sp02 likely dropped to high 80s but difficult to get accurate reading. Did not run into anything but came close. 1 seated rest break.    Stairs            Wheelchair Mobility    Modified Rankin (Stroke Patients Only)       Balance Overall balance assessment: Needs assistance Sitting-balance support: Feet supported;No upper extremity supported Sitting balance-Leahy Scale: Good Sitting balance - Comments: Able to reach outside BOS and donn socks without difficulty.    Standing balance support: During functional activity Standing balance-Leahy Scale: Fair Standing balance comment: Able to stand at sink and manipulate toothbrush and toothpaste without UE support but close Min guard needed.                             Cognition Arousal/Alertness: Awake/alert Behavior During Therapy: Impulsive Overall Cognitive Status: Impaired/Different from baseline Area of Impairment: Attention;Safety/judgement;Awareness;Problem solving;Following commands;Memory                   Current Attention Level: Selective Memory: Decreased short-term memory Following Commands: Follows one step commands consistently Safety/Judgement: Decreased awareness of safety;Decreased awareness of deficits Awareness: Intellectual   General Comments: Impulsive. Perseverating on worried about being evicted from her home. Stating things that were not related to topic. "you know those  i've fallen and I cannot get up," yea that is in here now (pointing to her throat).       Exercises      General Comments        Pertinent Vitals/Pain Pain Assessment: Faces Faces Pain Scale: Hurts a little bit Pain Location: head Pain Descriptors / Indicators: Headache Pain Intervention(s): Monitored during session;Repositioned    Home Living                      Prior Function            PT Goals (current goals can now be found in the care plan section) Progress towards PT goals: Progressing toward goals    Frequency    Min 2X/week      PT Plan Discharge plan needs to be updated    Co-evaluation PT/OT/SLP Co-Evaluation/Treatment: Yes Reason for Co-Treatment: For patient/therapist safety PT goals addressed during session: Mobility/safety with mobility        AM-PAC PT "6 Clicks" Daily Activity  Outcome Measure  Difficulty turning over in bed (including adjusting bedclothes, sheets and blankets)?: None Difficulty moving from lying on back to sitting on the side of the bed? : None Difficulty sitting down on and standing up from a chair with arms (e.g., wheelchair, bedside commode, etc,.)?: None Help needed moving to and from a bed to chair (including a wheelchair)?: A Little Help needed walking in hospital room?: A Little Help needed climbing 3-5 steps with a railing? : A Lot 6 Click Score: 20    End of Session Equipment Utilized During Treatment: Gait belt Activity Tolerance: Patient limited by fatigue Patient left: in bed;with call bell/phone within reach;with bed alarm set Nurse Communication: Mobility status PT Visit Diagnosis: Other abnormalities of gait and mobility (R26.89);Muscle weakness (generalized) (M62.81)     Time: 3149-7026 PT Time Calculation (min) (ACUTE ONLY): 26 min  Charges:  $Gait Training: 8-22 mins                    G Codes:       Wray Kearns, PT, DPT [phone]     [place] 03/14/2017, 2:40 PM

## 2017-03-14 NOTE — Progress Notes (Signed)
ENT Post Operative Note  Subjective:  Patient doing well since surgery. No difficulty breathing.  Objective: Vitals:   03/14/17 1213 03/14/17 1312  BP: 132/71 119/72  Pulse: 86 89  Resp: 16 20  Temp: 98.6 F (37 C) 99 F (37.2 C)  SpO2: 95% 96%    Physical Exam: CONSTITUTIONAL: well developed, nourished, no distress CARDIOVASCULAR: normal rate and regular rhythm PULMONARY/CHEST WALL: effort normal and no stridor, no stertor, no dysphonia HENT: Head : normocephalic and atraumatic Mucous membranes: normal   Assessment: Dominique Acosta 66 y.o. female who presents with subglottic stenosis after prolonged intubation with 7.5ETT. Dilated in OR with CO2 laser and balloon.    Plan:  -Our office will arrange an appointment for her to see Dr. Blenda Nicely in one month for followup.    Thank you for involving [facility] in the care of this patient. Should you need further assistance, please call our office at [phone].    Gavin Pound, MD

## 2017-03-14 NOTE — Transfer of Care (Signed)
Immediate Anesthesia Transfer of Care Note  Patient: Dominique Acosta  Procedure(s) Performed: MICROLARYNGOSCOPY WITH LASER (N/A Throat) KENALOG INJECTION (N/A Throat) BALLOON DILATION (N/A Throat)  Patient Location: PACU  Anesthesia Type:General  Level of Consciousness: awake and alert   Airway & Oxygen Therapy: Patient Spontanous Breathing and Patient connected to nasal cannula oxygen  Post-op Assessment: Report given to RN, Post -op Vital signs reviewed and stable and Patient moving all extremities X 4  Post vital signs: Reviewed and stable  Last Vitals:  Vitals:   03/14/17 0400 03/14/17 0902  BP: (!) 116/54 (!) 116/55  Pulse: 81 82  Resp: 18 14  Temp: 37.3 C 36.8 C  SpO2: 94% 95%    Last Pain:  Vitals:   03/14/17 0902  TempSrc:   PainSc: 0-No pain      Patients Stated Pain Goal: 2 (40/97/35 3299)  Complications: No apparent anesthesia complications

## 2017-03-14 NOTE — Progress Notes (Signed)
Received verbal instruction to resume tube feeding and medication per Md order. Pt tolerated well.

## 2017-03-14 NOTE — Anesthesia Procedure Notes (Addendum)
Procedure Name: General with mask airway Date/Time: 03/14/2017 8:17 AM Performed by: Inda Coke, CRNA Pre-anesthesia Checklist: Patient identified, Emergency Drugs available, Suction available, Patient being monitored and Timeout performed Patient Re-evaluated:Patient Re-evaluated prior to induction Oxygen Delivery Method: Circle system utilized Preoxygenation: Pre-oxygenation with 100% oxygen Induction Type: IV induction Ventilation: Mask ventilation without difficulty and Oral airway inserted - appropriate to patient size Comments: Jet ventilation throughout, Rigid broncoscopy performed by Dr. Redmond Baseman and Dr Blenda Nicely

## 2017-03-14 NOTE — Progress Notes (Signed)
Text paged MD x2 to receive verbal to resume medication and tube feeding for pt after procedure. Awaiting call back.

## 2017-03-14 NOTE — Progress Notes (Signed)
Pt returned from OR drowsy with no noted distress. Pt denies pain or discomfort. Neuro alert, oriented x3. Pt stable. Safety measures in place. Call bell within reach. Will continue to monitor.

## 2017-03-14 NOTE — Progress Notes (Signed)
PROGRESS NOTE    Dominique Acosta   ERD:[id number]  DOB: [birth year]  DOA: 02/22/2017 PCP: Lorene Dy, MD   Brief Narrative:  Dominique Acosta is a 66 y/o with COPD, CAD (MI), schizophrenia, dystonia, seizure disorder. She push her life alert button and was later found by EMS to be unresponsive with RR of 4, hypothermic and was intubated in the ER. The patient has a caregiver take care of her during the day. History obtained from sister included a history of abusing her medications. Review of prior chart notes an admission in 5/63 for metabolic encephalopathy Noted to have purulent secretions from ETT and have retropharyngeal edema noted on imaging on 11/26.  Respiratory culture + for MRSA.   Subjective: Sleepy after procedure- no complaints.  ROS: no complaints of nausea, vomiting, constipation diarrhea, cough, dyspnea or dysuria. No other complaints.   Assessment & Plan:   Principal Problem:   MRSA pneumonia ?   Acute respiratory failure with hypoxia  - sputum + for MRSA but never had infiltrates on CXR and procalcitonin was negative  ? If respiratory failure was due to something else ie improperly taking her medications and/or viral bronchitis- ? 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 - ENT has performed excision and dilatation in OR today - Dr Blenda Nicely [facility] ENT) recommends f/u in office in 1 month- her office will make the appt    Hypothyroid  - 11/15 TSH=51.3 -note that TSH was 43 in 4/18 and 62.1 in 5/18 (prior admissions) as well and she was suspected to be non-compliant - cont synthroid and follow  closely  Hypokalemia on admission - K 2.7  -  replaced -recheck tomorrow    Acute metabolic encephalopathy - presented with encephalopathy in 4/18 as well - somewhat confused still- may have had permanent damage due to respiratory arrest- but baseline is uncertain- apparently she was living at home - has received daily IV Thiamine daily which is now oral - B12 normal -  Have reviewed home meds to make sure we have on the correct home (psych) meds to help her confusion to resolve     Seizure disorder - episode of seizure vs "posturing"- has h/o dystonia- ? Overdose of neuroleptics - Neuro has been following - Keppra and Depakote started this admission - continuous EEG - nonictal   Dystonia - on Baclofen- continue  Schizophrenia - cont Abilify - 12/1 resumed Effexor   Triple lumen - have asked RN to obtain peripheral IVs and remove Triple lumen- IV was placed but patient pulled it out   Nutrition - Jevity and start free water   DVT prophylaxis: heparin Code Status: no CPR, shocks or ACLS meds Family Communication:  Disposition Plan: follow on telemetry Consultants:   PCCM  Neurology Procedures:  11/15 CONTINUOUS EEG : moderatelyabnormal continuous video EEG due to loss of normal background, diffuse sharply contoured slowing(resolved), and a partial burst-suppression pattern. This was indicative of an improvingglobal cerebral disturbance.No seizuresor definite epileptiform dischargeswere seen, however sharp activity was present early in the record.  11/28 CorTrak    Antimicrobials:  Anti-infectives (From admission, onward)   Start  Dose/Rate Route Frequency Ordered Stop   02/24/17 1200  levofloxacin (LEVAQUIN) IVPB 750 mg  Status:  Discontinued     750 mg 100 mL/hr over 90 Minutes Intravenous Every 48 hours 02/22/17 1435 02/23/17 1452   02/24/17 0900  ceFEPIme (MAXIPIME) 2 g in dextrose 5 % 50 mL IVPB  Status:  Discontinued     2 g 100 mL/hr over 30  Minutes Intravenous Every 12 hours 02/24/17 0830 02/26/17 1456   02/24/17 0500  vancomycin (VANCOCIN) IVPB 750 mg/150 ml premix     750 mg 150 mL/hr over 60 Minutes Intravenous Every 12 hours 02/23/17 1500 03/03/17 1704   02/23/17 2030  ceFEPIme (MAXIPIME) 2 g in dextrose 5 % 50 mL IVPB  Status:  Discontinued     2 g 100 mL/hr over 30 Minutes Intravenous Every 24 hours 02/23/17 2013 02/24/17 0830   02/23/17 1230  vancomycin (VANCOCIN) 1,250 mg in sodium chloride 0.9 % 250 mL IVPB  Status:  Discontinued     1,250 mg 166.7 mL/hr over 90 Minutes Intravenous Every 24 hours 02/22/17 1435 02/23/17 1500   02/22/17 2300  aztreonam (AZACTAM) 1 g in dextrose 5 % 50 mL IVPB  Status:  Discontinued     1 g 100 mL/hr over 30 Minutes Intravenous Every 8 hours 02/22/17 1435 02/23/17 1452   02/22/17 1500  aztreonam (AZACTAM) 2 g in dextrose 5 % 50 mL IVPB     2 g 100 mL/hr over 30 Minutes Intravenous  Once 02/22/17 1444 02/22/17 1957   02/22/17 1130  vancomycin (VANCOCIN) 2,000 mg in sodium chloride 0.9 % 500 mL IVPB     2,000 mg 250 mL/hr over 120 Minutes Intravenous  Once 02/22/17 1122 02/22/17 1802   02/22/17 1100  levofloxacin (LEVAQUIN) IVPB 750 mg     750 mg 100 mL/hr over 90 Minutes Intravenous  Once 02/22/17 1046 02/22/17 1309   02/22/17 1100  aztreonam (AZACTAM) 2 g in dextrose 5 % 50 mL IVPB  Status:  Discontinued     2 g 100 mL/hr over 30 Minutes Intravenous  Once 02/22/17 1046 02/22/17 1444   02/22/17 1100  vancomycin (VANCOCIN) IVPB 1000 mg/200 mL premix  Status:  Discontinued     1,000 mg 200 mL/hr over 60 Minutes Intravenous  Once 02/22/17 1046 02/22/17 1122       Objective: Vitals:   03/14/17 0945 03/14/17 0956 03/14/17 1213 03/14/17 1312  BP: (!) 112/59 (!) 103/57 132/71 119/72  Pulse: 78 80 86 89  Resp: 12 16 16 20   Temp:  98.3 F (36.8 C) 98.6 F (37 C) 99 F (37.2 C)  TempSrc:  Axillary Oral Oral  SpO2: 94% 93% 95% 96%  Weight:      Height:        Intake/Output  Summary (Last 24 hours) at 03/14/2017 1629 Last data filed at 03/14/2017 1549 Gross per 24 hour  Intake 500 ml  Output 902 ml  Net -402 ml   Filed Weights   03/11/17 0458 03/12/17 0420 03/13/17 0500  Weight: 86.7 kg (191 lb 2.2 oz) 83 kg (182 lb 15.7 oz) 85 kg (187 lb 6.3 oz)    Examination: General exam: Appears comfortable  HEENT: PERRLA, oral mucosa moist, no sclera icterus or thrush Respiratory system: Clear to auscultation. Respiratory effort normal. Cardiovascular system: S1 & S2 heard, RRR.  No murmurs  Gastrointestinal system: Abdomen soft, non-tender, nondistended. Normal bowel sound. No organomegaly Central nervous system: Alert- disoriented to year other wise knows month and  day- No focal neurological deficits. Extremities: No cyanosis, clubbing or edema Skin: No rashes or ulcers Psychiatry:  Mood  Appropriate     Data Reviewed: I have personally reviewed following labs and imaging studies  CBC: Recent Labs  Lab 03/08/17 0530 03/09/17 0500  WBC 10.6* 11.4*  NEUTROABS  --  6.7  HGB 8.5* 8.3*  HCT 25.7* 25.8*  MCV 99.2 98.5  PLT 338 413   Basic Metabolic Panel: Recent Labs  Lab 03/09/17 0500 03/09/17 1555 03/10/17 0549 03/10/17 0728 03/11/17 0507 03/12/17 0329 03/13/17 0523 03/14/17 0534  NA 135  --  136  --  140 139 138  --   K 2.9* 3.5 3.0*  --  3.7 3.8 3.2*  --   CL 96*  --  98*  --  101 99* 98*  --   CO2 31  --  31  --  32 30 32  --   GLUCOSE 102*  --  130*  --  107* 89 111*  --   BUN 8  --  12  --  11 13 19   --   CREATININE 0.67  --  0.77  --  0.59 0.66 0.61  --   CALCIUM 8.1*  --  8.4*  --  8.6* 8.6* 8.5*  --   MG 2.2 2.1 2.1  --  2.2 2.2 2.3 2.2  PHOS 2.2* 2.4*  --  3.2 3.0 3.2 3.8 3.3   GFR: Estimated Creatinine Clearance: 67.5 mL/min (by C-G formula based on SCr of 0.61 mg/dL). Liver Function Tests: No results for input(s): AST, ALT, ALKPHOS, BILITOT, PROT, ALBUMIN in the last 168 hours. No results for input(s): LIPASE, AMYLASE in the  last 168 hours. No results for input(s): AMMONIA in the last 168 hours. Coagulation Profile: No results for input(s): INR, PROTIME in the last 168 hours. Cardiac Enzymes: No results for input(s): CKTOTAL, CKMB, CKMBINDEX, TROPONINI in the last 168 hours. BNP (last 3 results) No results for input(s): PROBNP in the last 8760 hours. HbA1C: No results for input(s): HGBA1C in the last 72 hours. CBG: Recent Labs  Lab 03/13/17 1731 03/13/17 1955 03/14/17 0003 03/14/17 0406 03/14/17 1204  GLUCAP 117* 90 130* 94 126*   Lipid Profile: No results for input(s): CHOL, HDL, LDLCALC, TRIG, CHOLHDL, LDLDIRECT in the last 72 hours. Thyroid Function Tests: Recent Labs    03/12/17 0329  TSH 41.499*   Anemia Panel: No results for input(s): VITAMINB12, FOLATE, FERRITIN, TIBC, IRON, RETICCTPCT in the last 72 hours. Urine analysis:    Component Value Date/Time   COLORURINE COLORLESS (A) 03/02/2017 1045   APPEARANCEUR CLEAR 03/02/2017 1045   LABSPEC 1.006 03/02/2017 1045   PHURINE 7.0 03/02/2017 1045   GLUCOSEU NEGATIVE 03/02/2017 1045   HGBUR NEGATIVE 03/02/2017 1045   HGBUR negative 09/16/2009 0954   BILIRUBINUR NEGATIVE 03/02/2017 1045   KETONESUR NEGATIVE 03/02/2017 1045   PROTEINUR NEGATIVE 03/02/2017 1045   UROBILINOGEN 0.2 09/05/2014 2126   NITRITE NEGATIVE 03/02/2017 1045   LEUKOCYTESUR NEGATIVE 03/02/2017 1045   Sepsis Labs: @LABRCNTIP (procalcitonin:4,lacticidven:4) ) No results found for this or any previous visit (from the past 240 hour(s)).       Radiology Studies: No results found.    Scheduled Meds: . ARIPiprazole  20 mg Per Tube Daily  . aspirin  81 mg Per Tube Daily  . atorvastatin  20 mg Per Tube QHS  . baclofen  5 mg Per Tube BID  . chlorhexidine  15 mL Mouth Rinse BID  . Chlorhexidine  Gluconate Cloth  6 each Topical Daily  . feeding supplement (PRO-STAT SUGAR FREE 64)  30 mL Per Tube BID  . fentaNYL      . folic acid  1 mg Per Tube Daily  . free water   200 mL Per Tube Q8H  . heparin  5,000 Units Subcutaneous Q8H  . ipratropium-albuterol  3 mL Nebulization BID  . levETIRAcetam  1,000 mg Per Tube BID  . levothyroxine  100 mcg Per Tube QAC breakfast  . mouth rinse  15 mL Mouth Rinse q12n4p  . sennosides  5 mL Per Tube BID  . sodium chloride flush  10-40 mL Intracatheter Q12H  . thiamine  100 mg Per Tube Daily  . Valproate Sodium  500 mg Per Tube Q8H  . venlafaxine XR  300 mg Oral Q breakfast   Continuous Infusions: . feeding supplement (JEVITY 1.2 CAL) 1,000 mL (03/14/17 1136)     LOS: 20 days    Time spent in minutes: 35    Debbe Odea, MD [facility] Pager: [email or web address] Password [email or web address] 03/14/2017, 4:29 PM

## 2017-03-15 ENCOUNTER — Encounter (HOSPITAL_COMMUNITY): Payer: Self-pay | Admitting: Otolaryngology

## 2017-03-15 DIAGNOSIS — G40909 Epilepsy, unspecified, not intractable, without status epilepticus: Secondary | ICD-10-CM

## 2017-03-15 DIAGNOSIS — G9341 Metabolic encephalopathy: Secondary | ICD-10-CM

## 2017-03-15 DIAGNOSIS — J15212 Pneumonia due to Methicillin resistant Staphylococcus aureus: Secondary | ICD-10-CM

## 2017-03-15 DIAGNOSIS — E038 Other specified hypothyroidism: Secondary | ICD-10-CM

## 2017-03-15 LAB — CBC WITH DIFFERENTIAL/PLATELET
BASOS ABS: 0.1 10*3/uL (ref 0.0–0.1)
BASOS PCT: 0 %
EOS PCT: 1 %
Eosinophils Absolute: 0.1 10*3/uL (ref 0.0–0.7)
HCT: 28 % — ABNORMAL LOW (ref 36.0–46.0)
Hemoglobin: 9.1 g/dL — ABNORMAL LOW (ref 12.0–15.0)
LYMPHS PCT: 13 %
Lymphs Abs: 2.1 10*3/uL (ref 0.7–4.0)
MCH: 33.7 pg (ref 26.0–34.0)
MCHC: 32.5 g/dL (ref 30.0–36.0)
MCV: 103.7 fL — AB (ref 78.0–100.0)
MONO ABS: 1.5 10*3/uL — AB (ref 0.1–1.0)
Monocytes Relative: 10 %
Neutro Abs: 12 10*3/uL — ABNORMAL HIGH (ref 1.7–7.7)
Neutrophils Relative %: 76 %
PLATELETS: 354 10*3/uL (ref 150–400)
RBC: 2.7 MIL/uL — ABNORMAL LOW (ref 3.87–5.11)
RDW: 16 % — AB (ref 11.5–15.5)
WBC: 15.7 10*3/uL — ABNORMAL HIGH (ref 4.0–10.5)

## 2017-03-15 LAB — MAGNESIUM: Magnesium: 2.1 mg/dL (ref 1.7–2.4)

## 2017-03-15 LAB — PHOSPHORUS: Phosphorus: 3.1 mg/dL (ref 2.5–4.6)

## 2017-03-15 LAB — GLUCOSE, CAPILLARY
GLUCOSE-CAPILLARY: 106 mg/dL — AB (ref 65–99)
GLUCOSE-CAPILLARY: 84 mg/dL (ref 65–99)
GLUCOSE-CAPILLARY: 89 mg/dL (ref 65–99)
GLUCOSE-CAPILLARY: 91 mg/dL (ref 65–99)
Glucose-Capillary: 97 mg/dL (ref 65–99)
Glucose-Capillary: 84 mg/dL (ref 65–99)

## 2017-03-15 LAB — BASIC METABOLIC PANEL
Anion gap: 10 (ref 5–15)
BUN: 15 mg/dL (ref 6–20)
CALCIUM: 8.9 mg/dL (ref 8.9–10.3)
CO2: 34 mmol/L — ABNORMAL HIGH (ref 22–32)
CREATININE: 0.59 mg/dL (ref 0.44–1.00)
Chloride: 95 mmol/L — ABNORMAL LOW (ref 101–111)
Glucose, Bld: 98 mg/dL (ref 65–99)
Potassium: 3.7 mmol/L (ref 3.5–5.1)
SODIUM: 139 mmol/L (ref 135–145)

## 2017-03-15 LAB — TSH: TSH: 33.426 u[IU]/mL — ABNORMAL HIGH (ref 0.350–4.500)

## 2017-03-15 NOTE — Progress Notes (Signed)
CSW continuing to follow for disposition planning. CSW updated [facility] admissions coordinator with clinical information and plan for transition when medically cleared and eating a diet.   CSW will continue to follow.  Laveda Abbe, [place] Clinical Social Worker [phone]

## 2017-03-15 NOTE — Progress Notes (Signed)
Paged md about patient's telemetry because she continues to remove her telemetry also remind MD that she no longer has her cortrack and can't take her oral meds.

## 2017-03-15 NOTE — Progress Notes (Signed)
PROGRESS NOTE    Dominique Acosta  ERX:[id number] DOB: 08/19/1950 DOA: 02/22/2017 PCP: Lorene Dy, MD   Brief Narrative:  Dominique Acosta is a 66 y/o with COPD, CAD (MI), schizophrenia, dystonia, seizure disorder. She push her life alert button and was later found by EMS to be unresponsive with RR of 4, hypothermic and was intubated in the ER. The patient has a caregiver take care of her during the day. History obtained from sister included a history of abusing her medications. Review of prior chart notes an admission in 9/50 for metabolic encephalopathy Noted to have purulent secretions from ETT and have retropharyngeal edema noted on imaging on 11/26. Respiratory culture + for MRSA so was treated with 10 days of IV Vancomycin. Patient underwent FEES today and passed and will be placed on Dysphagia 3 Diet.   Assessment & Plan:   Principal Problem:   MRSA pneumonia ([place]) Active Problems:   Hypothyroid   Acute metabolic encephalopathy   Hypokalemia   Seizure disorder ([place])   Acute encephalopathy   Acute respiratory failure with hypoxia ([facility])   MRSA pneumonia ?   Acute respiratory failure with hypoxia  - Sputum + for MRSA but never had infiltrates on CXR and procalcitonin was negative  ? If respiratory failure was due to something else ie improperly taking her medications and/or viral bronchitis- ? Seizure at home - self extubated on 11/24 - completed 10 days of Vancomycin - WBC was 15.7 and elevated from 11.4 (likley from Stenosis) - Continue to Monitor and repeat CBC in AM   Retropharyngeal edema and dysphagia - noted to have stridor after self extubating on 11/24 - 11/28 failed swallow study and CorTraktube placed - SLP following for MBS vs FEES which was planned to be done after surgery for subglottic stenosis - FEES done today and now patient on Dysphagia 3 Diet   Subglottic tracheal stenosis - Noted to have subglottic stenosis on laryngoscopy - ENT has performed excision and  dilatation in OR 12/4  -Dr Blenda Nicely [facility] ENT) recommends f/u in office in 1 month- her office will make the appt  Hypothyroidism - 11/15 TSH=51.3 -note that TSH was 43 in 4/18 and 62.1 in 5/18 (prior admissions) as well and she was suspected to be non-compliant - cont synthroid and follow closely - Repeat TSH 12/5 was 33.426  Hypokalemia on Admission - K+ now 3.7 - Continue to Monitor and Replete as Necessary - Repeat CMP in AM   Acute Metabolic Encephalopathy - presented with encephalopathy in 4/18 as well - somewhat confused still- may have had permanent damage due to respiratory arrest- but baseline is uncertain- apparently she was living at home - has received daily IV Thiamine daily which is now oral - B12 normal -  Have reviewed home meds to make sure we have on the correct home (psych) meds to help her confusion to resolve - Improved   Seizure disorder - episode of seizure vs "posturing"- has h/o dystonia- ? Overdose of neuroleptics - Neuro had been following - Keppra and Depakote started this admission - continuous EEG - nonictal   Dystonia - on Baclofen- continue  Schizophrenia - cont Abilify - 12/1 resumed Effexor   DVT prophylaxis: Heparin 5,000 units sq q8h Code Status: Partial Code; No CPR, Shocks, or ACLS Meds Family Communication: No family present at bedside Disposition Plan: Likely SNF at D/C when medically stable   Consultants:   PCCM  Neurology  Procedures:  11/15 CONTINUOUS EEG : moderatelyabnormal continuous video EEG  due to loss of normal background, diffuse sharply contoured slowing(resolved), and a partial burst-suppression pattern. This was indicative of an improvingglobal cerebral disturbance.No seizuresor definite epileptiform dischargeswere seen, however sharp activity was present early in the record.  11/28 CorTrak -> 03/15/17  FEES 12/5 and now Dysphagia 3 Diet    Antimicrobials:  Anti-infectives (From admission,  onward)   Start     Dose/Rate Route Frequency Ordered Stop   02/24/17 1200  levofloxacin (LEVAQUIN) IVPB 750 mg  Status:  Discontinued     750 mg 100 mL/hr over 90 Minutes Intravenous Every 48 hours 02/22/17 1435 02/23/17 1452   02/24/17 0900  ceFEPIme (MAXIPIME) 2 g in dextrose 5 % 50 mL IVPB  Status:  Discontinued     2 g 100 mL/hr over 30 Minutes Intravenous Every 12 hours 02/24/17 0830 02/26/17 1456   02/24/17 0500  vancomycin (VANCOCIN) IVPB 750 mg/150 ml premix     750 mg 150 mL/hr over 60 Minutes Intravenous Every 12 hours 02/23/17 1500 03/03/17 1704   02/23/17 2030  ceFEPIme (MAXIPIME) 2 g in dextrose 5 % 50 mL IVPB  Status:  Discontinued     2 g 100 mL/hr over 30 Minutes Intravenous Every 24 hours 02/23/17 2013 02/24/17 0830   02/23/17 1230  vancomycin (VANCOCIN) 1,250 mg in sodium chloride 0.9 % 250 mL IVPB  Status:  Discontinued     1,250 mg 166.7 mL/hr over 90 Minutes Intravenous Every 24 hours 02/22/17 1435 02/23/17 1500   02/22/17 2300  aztreonam (AZACTAM) 1 g in dextrose 5 % 50 mL IVPB  Status:  Discontinued     1 g 100 mL/hr over 30 Minutes Intravenous Every 8 hours 02/22/17 1435 02/23/17 1452   02/22/17 1500  aztreonam (AZACTAM) 2 g in dextrose 5 % 50 mL IVPB     2 g 100 mL/hr over 30 Minutes Intravenous  Once 02/22/17 1444 02/22/17 1957   02/22/17 1130  vancomycin (VANCOCIN) 2,000 mg in sodium chloride 0.9 % 500 mL IVPB     2,000 mg 250 mL/hr over 120 Minutes Intravenous  Once 02/22/17 1122 02/22/17 1802   02/22/17 1100  levofloxacin (LEVAQUIN) IVPB 750 mg     750 mg 100 mL/hr over 90 Minutes Intravenous  Once 02/22/17 1046 02/22/17 1309   02/22/17 1100  aztreonam (AZACTAM) 2 g in dextrose 5 % 50 mL IVPB  Status:  Discontinued     2 g 100 mL/hr over 30 Minutes Intravenous  Once 02/22/17 1046 02/22/17 1444   02/22/17 1100  vancomycin (VANCOCIN) IVPB 1000 mg/200 mL premix  Status:  Discontinued     1,000 mg 200 mL/hr over 60 Minutes Intravenous  Once 02/22/17 1046  02/22/17 1122     Subjective: Seen and examined and did not want Cortrak back in. No CP or SOB. No other complaints or concerns.   Objective: Vitals:   03/15/17 0830 03/15/17 0943 03/15/17 1232 03/15/17 1757  BP: 100/78  (!) 109/47 (!) 118/52  Pulse: 88  63 68  Resp: 18  18 18   Temp: 99 F (37.2 C)  98.8 F (37.1 C) 98.9 F (37.2 C)  TempSrc: Oral  Oral Oral  SpO2: 93% 94% 96% 97%  Weight:      Height:        Intake/Output Summary (Last 24 hours) at 03/15/2017 2042 Last data filed at 03/15/2017 0024 Gross per 24 hour  Intake 165 ml  Output -  Net 165 ml   Filed Weights   03/12/17 0420  03/13/17 0500 03/15/17 0500  Weight: 83 kg (182 lb 15.7 oz) 85 kg (187 lb 6.3 oz) 82.7 kg (182 lb 5.1 oz)   Examination: Physical Exam:  Constitutional: WN/WD obese Caucasian female in NAD and appears calm Eyes: Lids and conjunctivae normal, sclerae anicteric  ENMT: External Ears, Nose appear normal. Grossly normal hearing. Mucous membranes are moist.  Neck: Appears normal, supple, no cervical masses, normal ROM, no appreciable thyromegaly, no JVD Respiratory: Diminished to auscultation bilaterally, no wheezing, rales, rhonchi or crackles. Normal respiratory effort and patient is not tachypenic. No accessory muscle use.  Cardiovascular: RRR, no murmurs / rubs / gallops. S1 and S2 auscultated. No extremity edema.  Abdomen: Soft, mildly tender, non-distended. No masses palpated. No appreciable hepatosplenomegaly. Bowel sounds positive.  GU: Deferred. Musculoskeletal: No clubbing / cyanosis of digits/nails. No joint deformity upper and lower extremities.  Skin: No rashes, lesions, ulcers on a limited skin eval. No induration; Warm and dry.  Neurologic: CN 2-12 grossly intact with no focal deficits. Romberg sign and cerebellar reflexes not assessed.  Psychiatric: Normal judgment and insight. Alert and oriented x 3. Normal mood and appropriate affect.   Data Reviewed: I have personally reviewed  following labs and imaging studies  CBC: Recent Labs  Lab 03/09/17 0500 03/15/17 1029  WBC 11.4* 15.7*  NEUTROABS 6.7 12.0*  HGB 8.3* 9.1*  HCT 25.8* 28.0*  MCV 98.5 103.7*  PLT 332 408   Basic Metabolic Panel: Recent Labs  Lab 03/10/17 0549  03/11/17 0507 03/12/17 0329 03/13/17 0523 03/14/17 0534 03/15/17 0258  NA 136  --  140 139 138  --  139  K 3.0*  --  3.7 3.8 3.2*  --  3.7  CL 98*  --  101 99* 98*  --  95*  CO2 31  --  32 30 32  --  34*  GLUCOSE 130*  --  107* 89 111*  --  98  BUN 12  --  11 13 19   --  15  CREATININE 0.77  --  0.59 0.66 0.61  --  0.59  CALCIUM 8.4*  --  8.6* 8.6* 8.5*  --  8.9  MG 2.1  --  2.2 2.2 2.3 2.2 2.1  PHOS  --    < > 3.0 3.2 3.8 3.3 3.1   < > = values in this interval not displayed.   GFR: Estimated Creatinine Clearance: 66.6 mL/min (by C-G formula based on SCr of 0.59 mg/dL). Liver Function Tests: No results for input(s): AST, ALT, ALKPHOS, BILITOT, PROT, ALBUMIN in the last 168 hours. No results for input(s): LIPASE, AMYLASE in the last 168 hours. No results for input(s): AMMONIA in the last 168 hours. Coagulation Profile: No results for input(s): INR, PROTIME in the last 168 hours. Cardiac Enzymes: No results for input(s): CKTOTAL, CKMB, CKMBINDEX, TROPONINI in the last 168 hours. BNP (last 3 results) No results for input(s): PROBNP in the last 8760 hours. HbA1C: No results for input(s): HGBA1C in the last 72 hours. CBG: Recent Labs  Lab 03/14/17 2341 03/15/17 0407 03/15/17 1128 03/15/17 1631 03/15/17 1930  GLUCAP 81 97 84 106* 89   Lipid Profile: No results for input(s): CHOL, HDL, LDLCALC, TRIG, CHOLHDL, LDLDIRECT in the last 72 hours. Thyroid Function Tests: Recent Labs    03/15/17 1121  TSH 33.426*   Anemia Panel: No results for input(s): VITAMINB12, FOLATE, FERRITIN, TIBC, IRON, RETICCTPCT in the last 72 hours. Sepsis Labs: No results for input(s): PROCALCITON, LATICACIDVEN in the last 168  hours.  No  results found for this or any previous visit (from the past 240 hour(s)).   Radiology Studies: No results found.  Scheduled Meds: . ARIPiprazole  20 mg Per Tube Daily  . aspirin  81 mg Per Tube Daily  . atorvastatin  20 mg Per Tube QHS  . baclofen  5 mg Per Tube BID  . chlorhexidine  15 mL Mouth Rinse BID  . Chlorhexidine Gluconate Cloth  6 each Topical Daily  . feeding supplement (PRO-STAT SUGAR FREE 64)  30 mL Per Tube BID  . folic acid  1 mg Per Tube Daily  . free water  200 mL Per Tube Q8H  . heparin  5,000 Units Subcutaneous Q8H  . ipratropium-albuterol  3 mL Nebulization BID  . levothyroxine  100 mcg Per Tube QAC breakfast  . mouth rinse  15 mL Mouth Rinse q12n4p  . sennosides  5 mL Per Tube BID  . sodium chloride flush  10-40 mL Intracatheter Q12H  . thiamine  100 mg Per Tube Daily  . venlafaxine XR  300 mg Oral Q breakfast   Continuous Infusions: . feeding supplement (JEVITY 1.2 CAL) 1,000 mL (03/14/17 1136)  . levETIRAcetam Stopped (03/15/17 1239)  . valproate sodium Stopped (03/15/17 1551)    LOS: 21 days   Kerney Elbe, DO [facility] Pager [phone]  If 7PM-7AM, please contact night-coverage [email or web address] Password [facility] 03/15/2017, 8:42 PM

## 2017-03-15 NOTE — Progress Notes (Signed)
Cortrak team member paged.

## 2017-03-15 NOTE — Progress Notes (Signed)
   03/15/17 1600  Clinical Encounter Type  Visited With Patient  Visit Type Follow-up  Referral From Other (Comment)  Consult/Referral To Chaplain  Spiritual Encounters  Spiritual Needs Emotional  Stress Factors  Patient Stress Factors Lack of knowledge  Chaplain visited with PT on referral from [employer].  The PT was disoriented and noticeable saddened.  She wanted help with the television and could not get past the fact that it was not turning to the channel that she needs it to be turned to.  Her reality was thrown off because her clock in the room was off by an hour.  Her understanding would be sharpened by fixing the clock.

## 2017-03-15 NOTE — Procedures (Signed)
Objective Swallowing Evaluation: Type of Study: FEES-Fiberoptic Endoscopic Evaluation of Swallow   Patient Details  Name: Dominique Acosta MRN: [id number] Date of Birth: 22-Aug-1950  Today's Date: 03/15/2017 Time: SLP Start Time (ACUTE ONLY): 1402 -SLP Stop Time (ACUTE ONLY): 1430  SLP Time Calculation (min) (ACUTE ONLY): 28 min   Past Medical History:  Past Medical History:  Diagnosis Date  . Arm pain   . Chronic pain   . COPD (chronic obstructive pulmonary disease) ([place])   . Coronary artery disease   . Depression   . Dystonia   . Epilepsy ([place])   . High cholesterol   . Humerus fracture   . Hypothyroid   . Insomnia   . Leukemia ([place])   . Myocardial infarct ([place])   . Psychosis ([place])   . Schizophrenia ([place])   . Seizure [facility])    Past Surgical History:  Past Surgical History:  Procedure Laterality Date  . BALLOON DILATION N/A 03/14/2017   Procedure: BALLOON DILATION;  Surgeon: Helayne Seminole, MD;  Location: [place];  Service: ENT;  Laterality: N/A;  . EXTERNAL EAR SURGERY    . HUMERUS FRACTURE SURGERY    . KENALOG INJECTION N/A 03/14/2017   Procedure: KENALOG INJECTION;  Surgeon: Helayne Seminole, MD;  Location: [place];  Service: ENT;  Laterality: N/A;  . MICROLARYNGOSCOPY WITH LASER N/A 03/14/2017   Procedure: MICROLARYNGOSCOPY WITH LASER;  Surgeon: Helayne Seminole, MD;  Location: [place];  Service: ENT;  Laterality: N/A;  . SHOULDER SURGERY     HPI: Pt is a 66 y.o.female former smoker who was found unresponsive after she called EMS. Patient had agonal respirations and hyperthermia. She required endotracheal intubation for airway protection 11/14. ENT was consulted when she did not have a cuff leak and MRI showed retropharyngeal fluid collection (resolved on CT Neck 11/26). Pt self-extubated on 11/24. Known history of COPD, coronary disease, MI, depression, schizophrenia, psychosis, dystonia, seizure disorder, substance abuse, chronic pain, and hypothyroidism.  Pt developed  subglottic stenonis s/p prolonged intubation.  03/14/17 underwent CO2 laser excision of subglottic stenosis with balloon dilation.    Subjective: alert, confused    Assessment / Plan / Recommendation  CHL IP CLINICAL IMPRESSIONS 03/15/2017  Clinical Impression Pt presents with significant improvements in swallow function.  Larynx with normal appearance s/p laser excision of subglottic stenosis yesterday - there is normal vocal fold adduction; patent airway.  Pt demonstrated adequate laryngeal closure and protected airway with all consistencies.  Trigger of pharyngeal swallow occurred at pyriforms for thin liquids, but pt successfully prevented penetration/aspiration, even when consuming large, consecutive boluses of thin liquid from a straw.  There was good pharyngeal clearance of materials post-swallow; no residue.  Recommend advancing diet to mechanical soft, thin liquids; give meds whole in puree.  D/W RN.   SLP Visit Diagnosis Dysphagia, oropharyngeal phase (R13.12)  Attention and concentration deficit following --  Frontal lobe and executive function deficit following --  Impact on safety and function Mild aspiration risk      CHL IP TREATMENT RECOMMENDATION 03/08/2017  Treatment Recommendations Therapy as outlined in treatment plan below;F/U FEES in --- days (Comment)     Prognosis 03/15/2017  Prognosis for Safe Diet Advancement --  Barriers to Reach Goals Cognitive deficits  Barriers/Prognosis Comment --    CHL IP DIET RECOMMENDATION 03/15/2017  SLP Diet Recommendations Dysphagia 3 (Mech soft) solids;Thin liquid  Liquid Administration via Straw  Medication Administration Whole meds with puree  Compensations --  Postural Changes --  CHL IP OTHER RECOMMENDATIONS 03/15/2017  Recommended Consults --  Oral Care Recommendations Oral care BID  Other Recommendations --      CHL IP FOLLOW UP RECOMMENDATIONS 03/08/2017  Follow up Recommendations Inpatient Rehab      CHL IP  FREQUENCY AND DURATION 03/15/2017  Speech Therapy Frequency (ACUTE ONLY) min 2x/week  Treatment Duration 1 week           CHL IP ORAL PHASE 03/15/2017  Oral Phase WFL  Oral - Pudding Teaspoon --  Oral - Pudding Cup --  Oral - Honey Teaspoon --  Oral - Honey Cup NT  Oral - Nectar Teaspoon --  Oral - Nectar Cup NT  Oral - Nectar Straw --  Oral - Thin Teaspoon --  Oral - Thin Cup --  Oral - Thin Straw --  Oral - Puree WFL  Oral - Mech Soft WFL  Oral - Regular --  Oral - Multi-Consistency --  Oral - Pill --  Oral Phase - Comment --    CHL IP PHARYNGEAL PHASE 03/15/2017  Pharyngeal Phase Impaired  Pharyngeal- Pudding Teaspoon --  Pharyngeal --  Pharyngeal- Pudding Cup --  Pharyngeal --  Pharyngeal- Honey Teaspoon --  Pharyngeal --  Pharyngeal- Honey Cup NT  Pharyngeal --  Pharyngeal- Nectar Teaspoon --  Pharyngeal --  Pharyngeal- Nectar Cup NT  Pharyngeal --  Pharyngeal- Nectar Straw --  Pharyngeal --  Pharyngeal- Thin Teaspoon --  Pharyngeal --  Pharyngeal- Thin Cup --  Pharyngeal --  Pharyngeal- Thin Straw Delayed swallow initiation-pyriform sinuses  Pharyngeal --  Pharyngeal- Puree Delayed swallow initiation-vallecula  Pharyngeal --  Pharyngeal- Mechanical Soft Delayed swallow initiation-vallecula  Pharyngeal --  Pharyngeal- Regular --  Pharyngeal --  Pharyngeal- Multi-consistency --  Pharyngeal --  Pharyngeal- Pill --  Pharyngeal --  Pharyngeal Comment --     No flowsheet data found.  No flowsheet data found.  Dominique Acosta 03/15/2017, 2:48 PM

## 2017-03-15 NOTE — Progress Notes (Signed)
Patient's off going [place] reported that she had informed MD that the patient pulled her cortack earlier in her shift and the team that places cortracks wasn't in the hospital at night. She said it will be placed tomorrow 03/15/17.

## 2017-03-16 LAB — GLUCOSE, CAPILLARY
GLUCOSE-CAPILLARY: 87 mg/dL (ref 65–99)
GLUCOSE-CAPILLARY: 98 mg/dL (ref 65–99)
GLUCOSE-CAPILLARY: 78 mg/dL (ref 65–99)
GLUCOSE-CAPILLARY: 86 mg/dL (ref 65–99)
Glucose-Capillary: 79 mg/dL (ref 65–99)

## 2017-03-16 LAB — COMPREHENSIVE METABOLIC PANEL
ALT: 8 U/L — ABNORMAL LOW (ref 14–54)
ANION GAP: 11 (ref 5–15)
AST: 17 U/L (ref 15–41)
Albumin: 2.9 g/dL — ABNORMAL LOW (ref 3.5–5.0)
Alkaline Phosphatase: 51 U/L (ref 38–126)
BUN: 15 mg/dL (ref 6–20)
CHLORIDE: 96 mmol/L — AB (ref 101–111)
CO2: 33 mmol/L — ABNORMAL HIGH (ref 22–32)
Calcium: 8.3 mg/dL — ABNORMAL LOW (ref 8.9–10.3)
Creatinine, Ser: 0.59 mg/dL (ref 0.44–1.00)
Glucose, Bld: 84 mg/dL (ref 65–99)
POTASSIUM: 2.8 mmol/L — AB (ref 3.5–5.1)
Sodium: 140 mmol/L (ref 135–145)
Total Bilirubin: 0.7 mg/dL (ref 0.3–1.2)
Total Protein: 6 g/dL — ABNORMAL LOW (ref 6.5–8.1)

## 2017-03-16 LAB — CBC WITH DIFFERENTIAL/PLATELET
BASOS ABS: 0 10*3/uL (ref 0.0–0.1)
Basophils Relative: 0 %
EOS PCT: 2 %
Eosinophils Absolute: 0.2 10*3/uL (ref 0.0–0.7)
HCT: 26.8 % — ABNORMAL LOW (ref 36.0–46.0)
Hemoglobin: 8.5 g/dL — ABNORMAL LOW (ref 12.0–15.0)
LYMPHS ABS: 2.9 10*3/uL (ref 0.7–4.0)
Lymphocytes Relative: 27 %
MCH: 33.3 pg (ref 26.0–34.0)
MCHC: 31.7 g/dL (ref 30.0–36.0)
MCV: 105.1 fL — AB (ref 78.0–100.0)
MONO ABS: 0.9 10*3/uL (ref 0.1–1.0)
Monocytes Relative: 8 %
Neutro Abs: 6.8 10*3/uL (ref 1.7–7.7)
Neutrophils Relative %: 63 %
PLATELETS: 388 10*3/uL (ref 150–400)
RBC: 2.55 MIL/uL — ABNORMAL LOW (ref 3.87–5.11)
RDW: 16.3 % — AB (ref 11.5–15.5)
WBC: 10.7 10*3/uL — ABNORMAL HIGH (ref 4.0–10.5)

## 2017-03-16 LAB — PHOSPHORUS: Phosphorus: 3.3 mg/dL (ref 2.5–4.6)

## 2017-03-16 LAB — MAGNESIUM: Magnesium: 2 mg/dL (ref 1.7–2.4)

## 2017-03-16 MED ORDER — ENSURE ENLIVE PO LIQD
237.0000 mL | Freq: Two times a day (BID) | ORAL | Status: DC
  Administered 2017-03-16 – 2017-03-17 (×2): 237 mL via ORAL

## 2017-03-16 MED ORDER — POTASSIUM CHLORIDE 10 MEQ/100ML IV SOLN
10.0000 meq | INTRAVENOUS | Status: AC
  Administered 2017-03-16 (×3): 10 meq via INTRAVENOUS
  Filled 2017-03-16 (×4): qty 100

## 2017-03-16 MED ORDER — DIVALPROEX SODIUM 250 MG PO DR TAB
250.0000 mg | DELAYED_RELEASE_TABLET | Freq: Three times a day (TID) | ORAL | Status: DC
  Administered 2017-03-16 – 2017-03-17 (×4): 250 mg via ORAL
  Filled 2017-03-16 (×4): qty 1

## 2017-03-16 MED ORDER — POTASSIUM CHLORIDE CRYS ER 20 MEQ PO TBCR
40.0000 meq | EXTENDED_RELEASE_TABLET | Freq: Two times a day (BID) | ORAL | Status: AC
  Administered 2017-03-16 (×2): 40 meq via ORAL
  Filled 2017-03-16 (×3): qty 2

## 2017-03-16 MED ORDER — LEVETIRACETAM 500 MG PO TABS
1000.0000 mg | ORAL_TABLET | Freq: Two times a day (BID) | ORAL | Status: DC
  Administered 2017-03-16 – 2017-03-17 (×2): 1000 mg via ORAL
  Filled 2017-03-16 (×2): qty 2

## 2017-03-16 NOTE — Progress Notes (Signed)
  Speech Language Pathology Treatment: Dysphagia  Patient Details Name: Dominique Acosta MRN: [id number] DOB: 26-Jun-1950 Today's Date: 03/16/2017 Time: 2233-6122 SLP Time Calculation (min) (ACUTE ONLY): 8 min  Assessment / Plan / Recommendation Clinical Impression  Treatment focused on f/u diet tolerance assessment s/p FEES 12/5. Patient verbalizing feeling "upset" after finding out that she needs continued therapy at SNF, making her largely unparticipatory with SLP. Able to verbalize results of FEES complete 12/5 and reports no difficulty swallowing since po diet initiated. Consumed little pos, thin liquids, without overt s/s of aspiration. Declined solid pos. No temp spikes, change in respiratory status to indicate poor tolerance of diet. Current diet remains appropriate. SLP treatment will likely be short term for dysphagia however will continue to f/u for tolerance given history and limitations of today's session.    HPI HPI: Pt is a 66 y.o.female former smoker who was found unresponsive after she called EMS. Patient had agonal respirations and hyperthermia. She required endotracheal intubation for airway protection 11/14. ENT was consulted when she did not have a cuff leak and MRI showed retropharyngeal fluid collection (resolved on CT Neck 11/26). Pt self-extubated on 11/24. Known history of COPD, coronary disease, MI, depression, schizophrenia, psychosis, dystonia, seizure disorder, substance abuse, chronic pain, and hypothyroidism.  Pt developed subglottic stenonis s/p prolonged intubation.  03/14/17 underwent CO2 laser excision of subglottic stenosis with balloon dilation.       SLP Plan  Continue with current plan of care       Recommendations  Diet recommendations: Dysphagia 3 (mechanical soft);Thin liquid Liquids provided via: Cup;Straw Medication Administration: Whole meds with puree Supervision: Patient able to self feed;Intermittent supervision to cue for compensatory  strategies Compensations: Slow rate;Small sips/bites Postural Changes and/or Swallow Maneuvers: Seated upright 90 degrees                Oral Care Recommendations: Oral care BID Follow up Recommendations: Inpatient Rehab SLP Visit Diagnosis: Dysphagia, oropharyngeal phase (R13.12) Plan: Continue with current plan of care                     [facility] [place], [place] [phone]   Alec Mcphee Meryl 03/16/2017, 10:53 AM

## 2017-03-16 NOTE — Care Management Note (Signed)
Case Management Note  Patient Details  Name: Dominique Acosta MRN: [id number] Date of Birth: 1951/02/27  Subjective/Objective:                    Action/Plan: Plan is for patient to d/c to [facility] when medically ready. CM following.   Expected Discharge Date:                  Expected Discharge Plan:  Skilled Nursing Facility  In-House Referral:  Clinical Social Work  Discharge planning Services  CM Consult  Post Acute Care Choice:    Choice offered to:     DME Arranged:    DME Agency:     HH Arranged:    [place] Agency:     Status of Service:  In process, will continue to follow  If discussed at Long Length of Stay Meetings, dates discussed:    Additional Comments:  Pollie Friar, RN 03/16/2017, 11:41 AM

## 2017-03-16 NOTE — Progress Notes (Signed)
Nutrition Follow-up  DOCUMENTATION CODES:   Obesity unspecified  INTERVENTION:  Ensure Enlive po BID, each supplement provides 350 kcal and 20 grams of protein   NUTRITION DIAGNOSIS:   Inadequate oral intake related to inability to eat as evidenced by NPO status. -resolved  GOAL:   Patient will meet greater than or equal to 90% of their needs -progressing  MONITOR:   PO intake, I & O's, Labs, Supplement acceptance, Weight trends  REASON FOR ASSESSMENT:   Consult Enteral/tube feeding initiation and management  ASSESSMENT:   66 yo female with PMH of epilepsy, hypothyroidism, HLD, depression, CAD, leukemia, MI, COPD, schizophrenia, psychosis who was admitted on 11/14 with hypoxic acute respiratory failure, aspiration PNA, requiring intubation on admission.  S/P excision and dilation of subglottic stenosis by ENT 12/4 Encephalopathy improved Patient pulled out her tube yesterday 12/5 Underwent FEES and passed 12/5 Now on NDD3 - thin liquids PO intake good, ate 75% this morning of eggs and sausage   Labs reviewed:  K+ 2.8,  Medications reviewed and include:  Folic Acid, 27P+, Thiamine  Diet Order:  DIET DYS 3 Room service appropriate? Yes; Fluid consistency: Thin  EDUCATION NEEDS:   No education needs have been identified at this time  Skin:  Skin Assessment: Reviewed RN Assessment  Last BM:  03/14/2017  Height:   Ht Readings from Last 1 Encounters:  02/22/17 5\' 1"  (1.549 m)    Weight:   Wt Readings from Last 1 Encounters:  03/15/17 182 lb 5.1 oz (82.7 kg)    Ideal Body Weight:  47.7 kg  BMI:  Body mass index is 34.45 kg/m.  Estimated Nutritional Needs:   Kcal:  1400-1600  Protein:  85-95 gm  Fluid:  1.4-1.6 L  Satira Anis. Duval Macleod, MS, RD LDN Inpatient Clinical Dietitian Pager [phone]

## 2017-03-16 NOTE — Progress Notes (Signed)
Pt non compliance with Telemetry and keep taking it off. M D to review this am if Tele should be discontinued to be discontinued.

## 2017-03-16 NOTE — Progress Notes (Signed)
Occupational Therapy Treatment Patient Details Name: Dominique Acosta MRN: [id number] DOB: March 04, 1951 Today's Date: 03/16/2017    History of present illness Pt is a 66 y.o. female former smoker who was found unresponsive. Found to have agonal respirations and hyperthermia. Intubated 11/14-11/24 (self extubated). MRI showed retropharyngeal fluid collection (resolved on CT Neck 11/26). PMH of COPD, coronary disease, MI, depression, schizophrenia, psychosis, dystonia, seizure disorder, substance abuse, chronic pain, and hypothyroidism. s/p MICROLARYNGOSCOPY WITH LASER of throat and balloon dilation.   OT comments  Pt continues to present with confusion; perseverating on calling her sister and easily distracted in hallway with stimulation, unable to recall room # after a few minutes. Pt required min assist overall for functional mobility secondary to balance deficits and poor safety awareness. D/c plan updated to SNF for follow up. Will continue to follow acutely.   Follow Up Recommendations  SNF;Supervision/Assistance - 24 hour    Equipment Recommendations  3 in 1 bedside commode    Recommendations for Other Services      Precautions / Restrictions Precautions Precautions: Fall Restrictions Weight Bearing Restrictions: No       Mobility Bed Mobility Overal bed mobility: Needs Assistance Bed Mobility: Supine to Sit;Sit to Supine     Supine to sit: Min guard;HOB elevated Sit to supine: Min guard;HOB elevated   General bed mobility comments: Min guard for safety. Increased time and effort  Transfers Overall transfer level: Needs assistance Equipment used: Rolling walker (2 wheeled) Transfers: Sit to/from Stand Sit to Stand: Min assist         General transfer comment: Min assist for steadying balance. Cues for hand placement, pt trying to pull with with bil UEs on RW    Balance Overall balance assessment: Needs assistance Sitting-balance support: Feet supported;No upper  extremity supported Sitting balance-Leahy Scale: Good     Standing balance support: Bilateral upper extremity supported Standing balance-Leahy Scale: Poor                             ADL either performed or assessed with clinical judgement   ADL Overall ADL's : Needs assistance/impaired                 Upper Body Dressing : Minimal assistance;Sitting       Toilet Transfer: Minimal assistance;Ambulation;RW           Functional mobility during ADLs: Minimal assistance;Rolling walker       Vision       Perception     Praxis      Cognition Arousal/Alertness: Awake/alert Behavior During Therapy: WFL for tasks assessed/performed Overall Cognitive Status: Impaired/Different from baseline Area of Impairment: Attention;Memory;Following commands;Safety/judgement;Awareness;Problem solving                   Current Attention Level: Selective Memory: Decreased short-term memory Following Commands: Follows one step commands consistently Safety/Judgement: Decreased awareness of safety;Decreased awareness of deficits Awareness: Intellectual Problem Solving: Difficulty sequencing;Slow processing;Requires verbal cues;Requires tactile cues General Comments: Perseverating on calling her sister so she could pay her rent and get $60 to get her hair done. Easily distracted required max cues to maintain attention to task in hallway with stimulation        Exercises     Shoulder Instructions       General Comments      Pertinent Vitals/ Pain       Pain Assessment: No/denies pain  Home Living  Prior Functioning/Environment              Frequency  Min 2X/week        Progress Toward Goals  OT Goals(current goals can now be found in the care plan section)  Progress towards OT goals: Progressing toward goals  Acute Rehab OT Goals OT Goal Formulation: With patient  Plan Discharge  plan needs to be updated    Co-evaluation                 AM-PAC PT "6 Clicks" Daily Activity     Outcome Measure   Help from another person eating meals?: A Little Help from another person taking care of personal grooming?: A Little Help from another person toileting, which includes using toliet, bedpan, or urinal?: A Lot Help from another person bathing (including washing, rinsing, drying)?: A Lot Help from another person to put on and taking off regular upper body clothing?: A Little Help from another person to put on and taking off regular lower body clothing?: A Lot 6 Click Score: 15    End of Session Equipment Utilized During Treatment: Gait belt;Rolling walker  OT Visit Diagnosis: Unsteadiness on feet (R26.81);Cognitive communication deficit (R41.841)   Activity Tolerance Patient tolerated treatment well   Patient Left in bed;with call bell/phone within reach;with bed alarm set   Nurse Communication Other (comment)(pt asking for sisters phone #)        Time: 0092-3300 OT Time Calculation (min): 15 min  Charges: OT General Charges $OT Visit: 1 Visit OT Treatments $Therapeutic Activity: 8-22 mins  Lyndal Reggio A. Ulice Brilliant, M.S., OTR/L Pager: [place] 03/16/2017, 4:46 PM

## 2017-03-16 NOTE — NC FL2 (Signed)
[place] LEVEL OF CARE SCREENING TOOL     IDENTIFICATION  Patient Name: Dominique Acosta Birthdate: 06/20/1950 Sex: female Admission Date (Current Location): 02/22/2017  [facility] and [place] Number:  Herbalist and Address:  [facility], [place] [address], [place], [place] [address]      Provider Number: [id number]  Attending Physician Name and Address:  Kerney Elbe, DO  Relative Name and Phone Number:       Current Level of Care: Hospital Recommended Level of Care: [place] Prior Approval Number:    Date Approved/Denied:   PASRR Number: Pending; manual review  Discharge Plan: SNF    Current Diagnoses: Patient Active Problem List   Diagnosis Date Noted  . MRSA pneumonia ([place]) 03/11/2017  . Acute respiratory failure with hypoxia ([place])   . Acute encephalopathy 02/22/2017  . Gastroesophageal reflux disease without esophagitis 09/11/2016  . Insomnia 08/26/2016  . Seizure disorder ([place]) 08/26/2016  . Humerus fracture 08/18/2016  . Acute metabolic encephalopathy 74/11/1446  . Acute lower UTI 08/03/2016  . Hypokalemia 08/03/2016  . Leukocytosis 08/03/2016  . Benign essential HTN 08/03/2016  . Schizoaffective disorder, bipolar type ([place]) 07/08/2016  . Hypothyroid 06/08/2006    Orientation RESPIRATION BLADDER Height & Weight     Self, Place  O2( 2L) Incontinent Weight: 182 lb 5.1 oz (82.7 kg) Height:  5\' 1"  (154.9 cm)  BEHAVIORAL SYMPTOMS/MOOD NEUROLOGICAL BOWEL NUTRITION STATUS      Incontinent Diet(mechanical soft)  AMBULATORY STATUS COMMUNICATION OF NEEDS Skin   Extensive Assist Verbally Surgical wounds                       Personal Care Assistance Level of Assistance  Bathing, Feeding, Dressing Bathing Assistance: Maximum assistance Feeding assistance: Limited assistance Dressing Assistance: Maximum assistance     Functional Limitations Info  Sight, Hearing, Speech Sight Info:  Adequate Hearing Info: Adequate Speech Info: Impaired(slurred/dysarthria)    SPECIAL CARE FACTORS FREQUENCY  PT (By licensed PT), OT (By licensed OT), Speech therapy     PT Frequency: 5x/wk OT Frequency: 5x/wk     Speech Therapy Frequency: 3x/wk      Contractures Contractures Info: Not present    Additional Factors Info  Code Status, Allergies, Psychotropic, Isolation Precautions Code Status Info: Partial Allergies Info: Amoxicillin, Penicillins, Phenytoin, Keflex Cephalexin, Dilaudid Hydromorphone Hcl, Haldol Haloperidol, Morphine And Related Psychotropic Info: Abilify 20mg  daily; Effexor-XR 300 mg daily with breakfast   Isolation Precautions Info: Contact precautions; MRSA     Current Medications (03/16/2017):  This is the current hospital active medication list Current Facility-Administered Medications  Medication Dose Route Frequency Provider Last Rate Last Dose  . acetaminophen (TYLENOL) solution 650 mg  650 mg Per Tube Q6H PRN Javier Glazier, MD   650 mg at 03/14/17 1533  . ARIPiprazole (ABILIFY) tablet 20 mg  20 mg Per Tube Daily Minor, Grace Bushy, NP   20 mg at 03/15/17 1458  . aspirin chewable tablet 81 mg  81 mg Per Tube Daily Chesley Mires, MD   81 mg at 03/15/17 1448  . atorvastatin (LIPITOR) tablet 20 mg  20 mg Per Tube QHS Chesley Mires, MD   20 mg at 03/15/17 2155  . baclofen (LIORESAL) tablet 5 mg  5 mg Per Tube BID Javier Glazier, MD   5 mg at 03/15/17 2337  . chlorhexidine (PERIDEX) 0.12 % solution 15 mL  15 mL Mouth Rinse BID Javier Glazier,  MD   15 mL at 03/15/17 2155  . Chlorhexidine Gluconate Cloth 2 % PADS 6 each  6 each Topical Daily Javier Glazier, MD   6 each at 03/12/17 1315  . feeding supplement (JEVITY 1.2 CAL) liquid 1,000 mL  1,000 mL Per Tube Continuous Allie Bossier, MD 45 mL/hr at 03/14/17 1136 1,000 mL at 03/14/17 1136  . feeding supplement (PRO-STAT SUGAR FREE 64) liquid 30 mL  30 mL Per Tube BID Allie Bossier, MD   30 mL at  03/15/17 2157  . folic acid (FOLVITE) tablet 1 mg  1 mg Per Tube Daily Debbe Odea, MD   1 mg at 03/15/17 1448  . free water 200 mL  200 mL Per Tube Q8H Rizwan, Saima, MD   200 mL at 03/14/17 1400  . heparin injection 5,000 Units  5,000 Units Subcutaneous Q8H Jennelle Human B, NP   5,000 Units at 03/16/17 [id number]  . ipratropium-albuterol (DUONEB) 0.5-2.5 (3) MG/3ML nebulizer solution 3 mL  3 mL Nebulization BID Allie Bossier, MD   3 mL at 03/16/17 0708  . levETIRAcetam (KEPPRA) 1,000 mg in sodium chloride 0.9 % 100 mL IVPB  1,000 mg Intravenous Q12H Opyd, Ilene Qua, MD   Stopped at 03/15/17 2353  . levothyroxine (SYNTHROID, LEVOTHROID) tablet 100 mcg  100 mcg Per Tube QAC breakfast Debbe Odea, MD   100 mcg at 03/16/17 0751  . MEDLINE mouth rinse  15 mL Mouth Rinse q12n4p Javier Glazier, MD   15 mL at 03/15/17 1452  . ondansetron (ZOFRAN) injection 4 mg  4 mg Intravenous Q6H PRN Javier Glazier, MD      . potassium chloride 10 mEq in 100 mL IVPB  10 mEq Intravenous Q1 Hr x 4 Raiford Noble [place], DO 100 mL/hr at 03/16/17 0751 10 mEq at 03/16/17 0751  . potassium chloride SA (K-DUR,KLOR-CON) CR tablet 40 mEq  40 mEq Oral BID Sheikh, Omair Latif, DO      . sennosides (SENOKOT) 8.8 MG/5ML syrup 5 mL  5 mL Per Tube BID Javier Glazier, MD   5 mL at 03/15/17 2337  . sodium chloride flush (NS) 0.9 % injection 10-40 mL  10-40 mL Intracatheter Q12H Javier Glazier, MD   10 mL at 03/15/17 2208  . sodium chloride flush (NS) 0.9 % injection 10-40 mL  10-40 mL Intracatheter PRN Javier Glazier, MD   10 mL at 03/12/17 2233  . thiamine (VITAMIN B-1) tablet 100 mg  100 mg Per Tube Daily Debbe Odea, MD   100 mg at 03/15/17 1447  . valproate (DEPACON) 500 mg in dextrose 5 % 50 mL IVPB  500 mg Intravenous Q8H Opyd, Ilene Qua, MD 55 mL/hr at 03/16/17 0508 500 mg at 03/16/17 0508  . venlafaxine XR (EFFEXOR-XR) 24 hr capsule 300 mg  300 mg Oral Q breakfast Debbe Odea, MD   300 mg at 03/16/17 4854      Discharge Medications: Please see discharge summary for a list of discharge medications.  Relevant Imaging Results:  Relevant Lab Results:   Additional Information SS#: [id number]  Geralynn Ochs, LCSW

## 2017-03-16 NOTE — Plan of Care (Signed)
  Progressing Education: Knowledge of General Education information will improve 03/16/2017 2005 - Progressing by Marcos Eke, RN Health Behavior/Discharge Planning: Ability to manage health-related needs will improve 03/16/2017 2005 - Progressing by Carin Hock I, RN Clinical Measurements: Ability to maintain clinical measurements within normal limits will improve 03/16/2017 2005 - Progressing by Marcos Eke, RN Diagnostic test results will improve 03/16/2017 2005 - Progressing by Carin Hock I, RN Coping: Level of anxiety will decrease 03/16/2017 2005 - Progressing by Carin Hock I, RN Safety: Ability to remain free from injury will improve 03/16/2017 2005 - Progressing by Carin Hock I, RN Activity: Ability to tolerate increased activity will improve 03/16/2017 2005 - Progressing by Carin Hock I, RN Role Relationship: Method of communication will improve 03/16/2017 2005 - Progressing by Marcos Eke, RN

## 2017-03-16 NOTE — Progress Notes (Signed)
PROGRESS NOTE    Dominique Acosta  XLK:[id number] DOB: 1951/04/02 DOA: 02/22/2017 PCP: Lorene Dy, MD   Brief Narrative:  Dominique Acosta is a 66 y/o with COPD, CAD (MI), schizophrenia, dystonia, seizure disorder. She push her life alert button and was later found by EMS to be unresponsive with RR of 4, hypothermic and was intubated in the ER. The patient has a caregiver take care of her during the day. History obtained from sister included a history of abusing her medications. Review of prior chart notes an admission in 3/66 for metabolic encephalopathy Noted to have purulent secretions from ETT and have retropharyngeal edema noted on imaging on 11/26. Respiratory culture + for MRSA so was treated with 10 days of IV Vancomycin. Patient underwent FEES 12/5and passed and will be placed on Dysphagia 3 Diet. Electrolytes were off so being replete. Anticipate D/C to SNF at [facility] in AM.   Assessment & Plan:   Principal Problem:   MRSA pneumonia ([place]) Active Problems:   Hypothyroid   Acute metabolic encephalopathy   Hypokalemia   Seizure disorder ([place])   Acute encephalopathy   Acute respiratory failure with hypoxia ([facility])   MRSA pneumonia ?   Acute respiratory failure with hypoxia  - Sputum + for MRSA but never had infiltrates on CXR and procalcitonin was negative  ? If respiratory failure was due to something else ie improperly taking her medications and/or viral bronchitis- ? 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 on laryngoscopy  - ENT has performed excision and dilatation in OR 12/4  -Dr Blenda Nicely [facility]) recommends f/u in office in 1 month- her office will make the appt  Hypothyroidism - 11/15 TSH=51.3 -note that TSH was 43 in 4/18 and 62.1 in 5/18 (prior admissions) as well and she was suspected to be non-compliant - cont synthroid and follow closely - Repeat TSH 12/5 was 33.426 - Repeat in AM and continue Synthroid   Hypokalemia - K+ now 2.8 - Replete with IV KCl 40 mEQ and po KCl 40 mEQ BID - Continue to Monitor and Replete as Necessary - Repeat CMP in AM   Acute Metabolic Encephalopathy - Presented with encephalopathy in 4/18 as well - Somewhat confused still- may have had permanent damage due to respiratory arrest- but baseline is uncertain- apparently she was living at home; Will go to SNF - has received daily IV Thiamine daily which is now oral - B12 normal -  Have reviewed home meds to make sure we have on the correct home (psych) meds to help her confusion to resolve - Still slightly confused    Seizure disorder - Episode of seizure vs "posturing"- has h/o dystonia- ? Overdose of neuroleptics - Neuro had been following - Keppra and Depakote started this admission and will continue  - continuous EEG - nonictal   Dystonia - on Baclofen- Continue  Schizophrenia - cont Abilify - 12/1 resumed Effexor   DVT prophylaxis: Heparin 5,000 units sq q8h Code Status: Partial Code; No CPR, Shocks, or ACLS  Meds Family Communication: No family present at bedside Disposition Plan: Likely SNF at D/C in AM   Consultants:   PCCM  Neurology  Procedures:  11/15 CONTINUOUS EEG : moderatelyabnormal continuous video EEG due to loss of normal background, diffuse sharply contoured slowing(resolved), and a partial burst-suppression pattern. This was indicative of an improvingglobal cerebral disturbance.No seizuresor definite epileptiform dischargeswere seen, however sharp activity was present  early in the record.  11/28 CorTrak -> 03/15/17  FEES 12/5 and now Dysphagia 3 Diet   Antimicrobials:  Anti-infectives (From admission, onward)   Start     Dose/Rate Route Frequency Ordered Stop   02/24/17 1200  levofloxacin (LEVAQUIN) IVPB 750 mg  Status:  Discontinued     750 mg 100 mL/hr over 90 Minutes Intravenous Every 48 hours 02/22/17 1435 02/23/17 1452   02/24/17 0900  ceFEPIme (MAXIPIME) 2 g in dextrose 5 % 50 mL IVPB  Status:  Discontinued     2 g 100 mL/hr over 30 Minutes Intravenous Every 12 hours 02/24/17 0830 02/26/17 1456   02/24/17 0500  vancomycin (VANCOCIN) IVPB 750 mg/150 ml premix     750 mg 150 mL/hr over 60 Minutes Intravenous Every 12 hours 02/23/17 1500 03/03/17 1704   02/23/17 2030  ceFEPIme (MAXIPIME) 2 g in dextrose 5 % 50 mL IVPB  Status:  Discontinued     2 g 100 mL/hr over 30 Minutes Intravenous Every 24 hours 02/23/17 2013 02/24/17 0830   02/23/17 1230  vancomycin (VANCOCIN) 1,250 mg in sodium chloride 0.9 % 250 mL IVPB  Status:  Discontinued     1,250 mg 166.7 mL/hr over 90 Minutes Intravenous Every 24 hours 02/22/17 1435 02/23/17 1500   02/22/17 2300  aztreonam (AZACTAM) 1 g in dextrose 5 % 50 mL IVPB  Status:  Discontinued     1 g 100 mL/hr over 30 Minutes Intravenous Every 8 hours 02/22/17 1435 02/23/17 1452   02/22/17 1500  aztreonam (AZACTAM) 2 g in dextrose 5 % 50 mL IVPB     2 g 100 mL/hr over 30 Minutes Intravenous  Once 02/22/17 1444 02/22/17 1957   02/22/17 1130  vancomycin (VANCOCIN) 2,000 mg in sodium chloride 0.9 % 500 mL IVPB     2,000 mg 250 mL/hr over 120 Minutes Intravenous  Once 02/22/17 1122 02/22/17 1802   02/22/17 1100  levofloxacin (LEVAQUIN) IVPB 750 mg     750 mg 100 mL/hr over 90 Minutes Intravenous  Once 02/22/17 1046 02/22/17 1309   02/22/17 1100  aztreonam (AZACTAM) 2 g in dextrose 5 % 50 mL IVPB  Status:  Discontinued     2 g 100 mL/hr over 30 Minutes Intravenous  Once 02/22/17 1046 02/22/17 1444   02/22/17 1100   vancomycin (VANCOCIN) IVPB 1000 mg/200 mL premix  Status:  Discontinued     1,000 mg 200 mL/hr over 60 Minutes Intravenous  Once 02/22/17 1046 02/22/17 1122     Subjective: Seen and examined and was upset because she wanted to go home but her electrolytes were still being replete. No CP or SOB. No other concerns or complaints at this time.   Objective: Vitals:   03/16/17 0708 03/16/17 1019 03/16/17 1451 03/16/17 1730  BP:  (!) 128/52 124/60 139/69  Pulse:  85 96 86  Resp:  18 18 18   Temp:  99.5 F (37.5 C) 99.8 F (37.7 C) 98.2 F (36.8 C)  TempSrc:  Oral Oral Oral  SpO2: 91% 91% 91% 93%  Weight:  Height:        Intake/Output Summary (Last 24 hours) at 03/16/2017 1911 Last data filed at 03/16/2017 1400 Gross per 24 hour  Intake 920 ml  Output -  Net 920 ml   Filed Weights   03/12/17 0420 03/13/17 0500 03/15/17 0500  Weight: 83 kg (182 lb 15.7 oz) 85 kg (187 lb 6.3 oz) 82.7 kg (182 lb 5.1 oz)   Examination: Physical Exam:  Constitutional: Patient slightly confused but oriented. WN/WD obese Caucasian female in NAD appears agitated.  Eyes: Sclerae anicteric. Lids normal ENMT: External Ears and nose appear normal. Grossly normal hearing. MMM Neck: Supple with no JVD Respiratory: Diminished to auscultation bilaterally. No appreciable wheezing/rales/rhonchi Cardiovascular: RRR; No m/r/g. S1/S2 Auscultated. No extremity edema Abdomen: Soft, NT, ND. Bowel sounds presnt GU: Deferred Musculoskeletal: No contractures. No cyanosis. No joint deformities noted Skin: No rashes, lesions on a limited skin eval Neurologic: CN 2-12 grossly intact. No appreciable focal deficits Psychiatric: Agitated mood and affect. Awake and alert, Slightly confused  Data Reviewed: I have personally reviewed following labs and imaging studies  CBC: Recent Labs  Lab 03/15/17 1029 03/16/17 0309  WBC 15.7* 10.7*  NEUTROABS 12.0* 6.8  HGB 9.1* 8.5*  HCT 28.0* 26.8*  MCV 103.7* 105.1*  PLT  354 235   Basic Metabolic Panel: Recent Labs  Lab 03/11/17 0507 03/12/17 0329 03/13/17 0523 03/14/17 0534 03/15/17 0258 03/16/17 0309  NA 140 139 138  --  139 140  K 3.7 3.8 3.2*  --  3.7 2.8*  CL 101 99* 98*  --  95* 96*  CO2 32 30 32  --  34* 33*  GLUCOSE 107* 89 111*  --  98 84  BUN 11 13 19   --  15 15  CREATININE 0.59 0.66 0.61  --  0.59 0.59  CALCIUM 8.6* 8.6* 8.5*  --  8.9 8.3*  MG 2.2 2.2 2.3 2.2 2.1 2.0  PHOS 3.0 3.2 3.8 3.3 3.1 3.3   GFR: Estimated Creatinine Clearance: 66.6 mL/min (by C-G formula based on SCr of 0.59 mg/dL). Liver Function Tests: Recent Labs  Lab 03/16/17 0309  AST 17  ALT 8*  ALKPHOS 51  BILITOT 0.7  PROT 6.0*  ALBUMIN 2.9*   No results for input(s): LIPASE, AMYLASE in the last 168 hours. No results for input(s): AMMONIA in the last 168 hours. Coagulation Profile: No results for input(s): INR, PROTIME in the last 168 hours. Cardiac Enzymes: No results for input(s): CKTOTAL, CKMB, CKMBINDEX, TROPONINI in the last 168 hours. BNP (last 3 results) No results for input(s): PROBNP in the last 8760 hours. HbA1C: No results for input(s): HGBA1C in the last 72 hours. CBG: Recent Labs  Lab 03/15/17 2348 03/16/17 0403 03/16/17 0701 03/16/17 1140 03/16/17 1640  GLUCAP 91 87 98 79 78   Lipid Profile: No results for input(s): CHOL, HDL, LDLCALC, TRIG, CHOLHDL, LDLDIRECT in the last 72 hours. Thyroid Function Tests: Recent Labs    03/15/17 1121  TSH 33.426*   Anemia Panel: No results for input(s): VITAMINB12, FOLATE, FERRITIN, TIBC, IRON, RETICCTPCT in the last 72 hours. Sepsis Labs: No results for input(s): PROCALCITON, LATICACIDVEN in the last 168 hours.  No results found for this or any previous visit (from the past 240 hour(s)).   Radiology Studies: No results found.  Scheduled Meds: . ARIPiprazole  20 mg Per Tube Daily  . aspirin  81 mg Per Tube Daily  . atorvastatin  20 mg Per Tube QHS  . baclofen  5  mg Per Tube BID  .  Chlorhexidine Gluconate Cloth  6 each Topical Daily  . divalproex  250 mg Oral Q8H  . feeding supplement (ENSURE ENLIVE)  237 mL Oral BID BM  . folic acid  1 mg Per Tube Daily  . heparin  5,000 Units Subcutaneous Q8H  . ipratropium-albuterol  3 mL Nebulization BID  . levETIRAcetam  1,000 mg Oral BID  . levothyroxine  100 mcg Per Tube QAC breakfast  . potassium chloride  40 mEq Oral BID  . sennosides  5 mL Per Tube BID  . thiamine  100 mg Per Tube Daily  . venlafaxine XR  300 mg Oral Q breakfast   Continuous Infusions:   LOS: 22 days   Kerney Elbe, DO [facility] Pager [phone]  If 7PM-7AM, please contact night-coverage [email or web address] Password [email or web address] 03/16/2017, 7:11 PM

## 2017-03-17 ENCOUNTER — Inpatient Hospital Stay (HOSPITAL_COMMUNITY): Payer: Medicare Other

## 2017-03-17 DIAGNOSIS — R0602 Shortness of breath: Secondary | ICD-10-CM

## 2017-03-17 LAB — COMPREHENSIVE METABOLIC PANEL
ALT: 10 U/L — ABNORMAL LOW (ref 14–54)
ANION GAP: 6 (ref 5–15)
AST: 19 U/L (ref 15–41)
Albumin: 3 g/dL — ABNORMAL LOW (ref 3.5–5.0)
Alkaline Phosphatase: 53 U/L (ref 38–126)
BUN: 15 mg/dL (ref 6–20)
CHLORIDE: 101 mmol/L (ref 101–111)
CO2: 32 mmol/L (ref 22–32)
Calcium: 8.6 mg/dL — ABNORMAL LOW (ref 8.9–10.3)
Creatinine, Ser: 0.55 mg/dL (ref 0.44–1.00)
Glucose, Bld: 87 mg/dL (ref 65–99)
POTASSIUM: 3.9 mmol/L (ref 3.5–5.1)
Sodium: 139 mmol/L (ref 135–145)
Total Bilirubin: 0.5 mg/dL (ref 0.3–1.2)
Total Protein: 6.4 g/dL — ABNORMAL LOW (ref 6.5–8.1)

## 2017-03-17 LAB — CBC WITH DIFFERENTIAL/PLATELET
Basophils Absolute: 0 10*3/uL (ref 0.0–0.1)
Basophils Relative: 0 %
EOS ABS: 0.2 10*3/uL (ref 0.0–0.7)
EOS PCT: 2 %
HCT: 29.5 % — ABNORMAL LOW (ref 36.0–46.0)
Hemoglobin: 9.2 g/dL — ABNORMAL LOW (ref 12.0–15.0)
LYMPHS ABS: 2.9 10*3/uL (ref 0.7–4.0)
LYMPHS PCT: 32 %
MCH: 32.5 pg (ref 26.0–34.0)
MCHC: 31.2 g/dL (ref 30.0–36.0)
MCV: 104.2 fL — ABNORMAL HIGH (ref 78.0–100.0)
MONO ABS: 0.7 10*3/uL (ref 0.1–1.0)
Monocytes Relative: 8 %
Neutro Abs: 5.1 10*3/uL (ref 1.7–7.7)
Neutrophils Relative %: 58 %
PLATELETS: 344 10*3/uL (ref 150–400)
RBC: 2.83 MIL/uL — ABNORMAL LOW (ref 3.87–5.11)
RDW: 16.3 % — AB (ref 11.5–15.5)
WBC: 9 10*3/uL (ref 4.0–10.5)

## 2017-03-17 LAB — LIPID PANEL
Cholesterol: 120 mg/dL (ref 0–200)
HDL: 38 mg/dL — ABNORMAL LOW
LDL Cholesterol: 56 mg/dL (ref 0–99)
Total CHOL/HDL Ratio: 3.2 ratio
Triglycerides: 130 mg/dL
VLDL: 26 mg/dL (ref 0–40)

## 2017-03-17 LAB — GLUCOSE, CAPILLARY
GLUCOSE-CAPILLARY: 84 mg/dL (ref 65–99)
GLUCOSE-CAPILLARY: 76 mg/dL (ref 65–99)
Glucose-Capillary: 69 mg/dL (ref 65–99)

## 2017-03-17 LAB — MAGNESIUM: Magnesium: 2.1 mg/dL (ref 1.7–2.4)

## 2017-03-17 LAB — TSH: TSH: 34.621 u[IU]/mL — ABNORMAL HIGH (ref 0.350–4.500)

## 2017-03-17 LAB — PHOSPHORUS: Phosphorus: 3.1 mg/dL (ref 2.5–4.6)

## 2017-03-17 MED ORDER — ENSURE ENLIVE PO LIQD
237.0000 mL | Freq: Two times a day (BID) | ORAL | 12 refills | Status: DC
Start: 2017-03-17 — End: 2017-03-21

## 2017-03-17 MED ORDER — ASPIRIN 81 MG PO CHEW: 81.0000 mg | CHEWABLE_TABLET | Freq: Every day | ORAL | 0 refills | Status: DC

## 2017-03-17 MED ORDER — LEVETIRACETAM 1000 MG PO TABS: 1000.0000 mg | ORAL_TABLET | Freq: Two times a day (BID) | ORAL | 0 refills | Status: AC

## 2017-03-17 MED ORDER — FOLIC ACID 1 MG PO TABS: 1.0000 mg | ORAL_TABLET | Freq: Every day | ORAL | 0 refills | Status: AC

## 2017-03-17 MED ORDER — DIVALPROEX SODIUM 250 MG PO DR TAB: 250.0000 mg | DELAYED_RELEASE_TABLET | Freq: Three times a day (TID) | ORAL | 0 refills | Status: AC

## 2017-03-17 MED ORDER — THIAMINE HCL 100 MG PO TABS: 100.0000 mg | ORAL_TABLET | Freq: Every day | ORAL | 0 refills | Status: DC

## 2017-03-17 MED ORDER — SENNOSIDES 8.8 MG/5ML PO SYRP: 5.0000 mL | ORAL_SOLUTION | Freq: Two times a day (BID) | ORAL | 0 refills | Status: DC

## 2017-03-17 MED ORDER — IPRATROPIUM-ALBUTEROL 0.5-2.5 (3) MG/3ML IN SOLN: 3.0000 mL | Freq: Two times a day (BID) | RESPIRATORY_TRACT | 0 refills | Status: AC

## 2017-03-17 MED ORDER — LEVOTHYROXINE SODIUM 100 MCG PO TABS: 100.0000 ug | ORAL_TABLET | Freq: Every day | ORAL | 0 refills | Status: DC

## 2017-03-17 MED ORDER — ARIPIPRAZOLE 20 MG PO TABS: 20.0000 mg | ORAL_TABLET | Freq: Every day | ORAL | 0 refills | Status: AC

## 2017-03-17 NOTE — Progress Notes (Signed)
Patient discharged to [facility] SNF, attempted reported no answer at number left. Patient transported by [facility] services discharge packet sent with [facility] staff. Family was visiting with patient earlier this afternoon and made aware of plan.  Symia Herdt, Tivis Ringer, RN

## 2017-03-17 NOTE — Discharge Summary (Signed)
Physician Discharge Summary  Dominique Acosta MPN:[id number] DOB: [birth year] DOA: 02/22/2017  PCP: Lorene Dy, MD  Admit date: 02/22/2017 Discharge date: 03/17/2017  Admitted From: Home Disposition: SNF  Recommendations for Outpatient Follow-up:  1. Follow up with PCP in 1-2 weeks 2. Follow up with [facility] ENT Dr. Sheppard Penton within 1 month; The office will arrange an appointment  3. Follow up with Neurology as an outpatient  4. Please obtain CMP/CBC, Mag, Phos in one week 5. Please follow up on the following pending results:  Home Health: No Equipment/Devices: 3 in 1 Bedside Commode     Discharge Condition: Stable  CODE STATUS: Partial Code; No CPR, Shocks, or ACLS Meds Diet recommendation: Dysphagia 3 Diet  Brief/Interim Summary: The patient Dominique Acosta a 66 y/o with COPD, CAD (MI), schizophrenia, dystonia, seizure disorder. She pushed her life alert button and was later found by EMS to be unresponsive with RR of 4, hypothermic and was intubated in the ER. The patient has a caregiver take care of her during the day. History was obtained from sister included a history of abusing her medications. Review of prior chart notes an admission in 0/08 for metabolic encephalopathy.  ER workup included CT head which was negative for acute abnormalities, K 2.7, mild AKI with creatinine 1.68, lactate WNL, likely baseline hypercarbia, no acute changes on chest x-ray, UA and UDS negative.  PCCM called for ICU admission. Neurology was consulted because after intubation she was noted to have a decerebrate posturing and frothing around ET tube. 2 of Ativan were given and neurology was called along with EEG. The EEG had a pattern which is most likely non-ictal, but Neurology felt in the right clinical context could represent post-ictal changes.  Also possible would be that she suffered some type of hypoxic injury so MRI was obtained. She was placed on Antiepileptics with keppra and Depakote.  Subsequently patient required CVC and vasoactive infusion. LTM-EEG was discontinued on 11/16. She was found to have an elevated TSH so Myxedema Coma was considered so she was supplemented with Thyroid medication.   She was Noted to have purulent secretions from ETT and have retropharyngeal edema noted on imaging on 11/26. Respiratory culture + for MRSA so was treated with 10 days of IV Vancomycin. ENT was consulted for retropharyngeal edema. She remained intubated until 03/04/17 when she self-extubated. She remained stable and was transferred to [facility] service on 11/28. She had some dysphagia after being extubated and SLP evaluated and FEES demonstrated decreased laryngeal sensation as well as aspiration so NGT was placed. She underwent Suspension Microdirect Laryngoscopy with CO Laser dilatation and balloon Dilation on 03/14/17. Patient removed Cortrak and Patient underwent FEES 12/5 and passed and was placed on Dysphagia 3 Diet. She improved and electrolytes were replete. She remained a little confused but was alert and oriented. She will be D/C'd to SNF at this time for continued rehabilitation and will need to follow up with Neurology and ENT as an outpatient.   Discharge Diagnoses:  Principal Problem:   MRSA pneumonia ([place]) Active Problems:   Hypothyroid   Acute metabolic encephalopathy   Hypokalemia   Seizure disorder ([place])   Acute encephalopathy   Acute respiratory failure with hypoxia (HCC)  MRSA pneumonia ? Acute respiratory failure with hypoxia s/p Intubation 11/14-11/24 - Sputum + for MRSA but never had infiltrates on CXR and procalcitonin was negative ? If respiratory failure was due to something else ie improperly taking her medications and/or viral bronchitis- ? Seizure at home - self  extubated on 11/24 - completed 10 days of Vancomycin - WBC now 10.7 - Continue to Monitor and repeat CBC at SNF - Doing well Respiratory wise after Intubation  - Repeat CXR today showed Interim  removal of lines and tubes.  Low lung volumes with mild basilar atelectasis, chest is otherwise unremarkable.  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 on laryngoscopy - ENThas performedexcision and dilatation in OR12/4 -Dr Blenda Nicely [facility] ENT) recommends f/u in office in 1 month- her office will make the appt  Hypothyroidism - 11/15 TSH=51.3 -note that TSH was 43 in 4/18 and 62.1 in 5/18 (prior admissions) as well and she was suspected to be non-compliant - cont synthroid and follow closely - Repeat TSH 12/5 was 33.426; on 12/7 was 34.621 - Repeat TSH and Free T4 in about 4-6 Weeks and continue Synthroid at current dose and defer to PCP to Increase  Hypokalemia, improved - K+ now 3.9 from 2.8 - Continue to Monitor and Replete as Necessary - Repeat CMP at SNF  Acute Metabolic Encephalopathy - Presented with encephalopathy in 4/18 as well - Somewhat confused still- may have had permanent damage due to respiratory arrest- but baseline is uncertain- apparently she was living at home; Will go to SNF - has received daily IV Thiamine dailywhichis now oral - B12 normal -Have reviewed home medsto make sure we have on the correct home (psych) meds to help her confusion to resolve - Still slightly confused appears baseline now  Seizure Disorder - Episode of seizure vs "posturing"- has h/o dystonia- ? Overdose of neuroleptics - Neuro had been following - Keppra and Depakote started this admission and will continue D/C - continuous EEG - nonictal - Follow up with Neurology as an outpatient   Dystonia - on Baclofen-  Continue  Schizophrenia - cont Abilify - 12/1 resumed Effexor   Normocytic Anemia -Stable. Hb/Hct was 9.2/29.5 -Continue to Monitor for S/Sx of Bleeding -Repeat CBC at SNF  Tobacco Abuse -Smoking Cessation Counseling given  Discharge Instructions Discharge Instructions    Call MD for:  difficulty breathing, headache or visual disturbances   Complete by:  As directed    Call MD for:  extreme fatigue   Complete by:  As directed    Call MD for:  hives   Complete by:  As directed    Call MD for:  persistant dizziness or light-headedness   Complete by:  As directed    Call MD for:  persistant nausea and vomiting   Complete by:  As directed    Call MD for:  redness, tenderness, or signs of infection (pain, swelling, redness, odor or green/yellow discharge around incision site)   Complete by:  As directed    Call MD for:  severe uncontrolled pain   Complete by:  As directed    Call MD for:  temperature >100.4   Complete by:  As directed    Diet - low sodium heart healthy   Complete by:  As directed    DYSPHAGIA 3 DIET   Discharge instructions   Complete by:  As directed    Follow up with PCP, ENT, and Neurology  as an outpatient. Take all medications as prescribed. If symptoms change or worsen please return to the ED For Evaluation.   Increase activity slowly   Complete by:  As directed      Allergies as of 03/17/2017      Reactions   Amoxicillin Anaphylaxis, Other (See Comments)   Childhood allergy >> tolerated cephalosporins Has patient had a PCN reaction causing immediate rash, facial/tongue/throat swelling, SOB or lightheadedness with hypotension: Yes Has patient had a PCN reaction causing severe rash involving mucus membranes or skin necrosis: No Has patient had a PCN reaction that required hospitalization No Has patient had a PCN reaction occurring within the last 10 years: No If all of the above answers are "NO", then may proceed with Cephalosporin use.    Penicillins Anaphylaxis, Other (See Comments)   Childhood allergy Has patient had a PCN reaction causing immediate rash, facial/tongue/throat swelling, SOB or lightheadedness with hypotension: Yes Has patient had a PCN reaction causing severe rash involving mucus membranes or skin necrosis: No Has patient had a PCN reaction that required hospitalization No Has patient had a PCN reaction occurring within the last 10 years: No If all of the above answers are "NO", then may proceed with Cephalosporin use.   Phenytoin Other (See Comments)   Reaction:  CNS disorder    Keflex [cephalexin] Diarrhea   Dilaudid [hydromorphone Hcl] Other (See Comments)   Reaction:  Psychosis    Haldol [haloperidol] Other (See Comments)   Reaction:  Psychosis    Morphine And Related Other (See Comments)   Reaction:  Psychosis       Medication List    STOP taking these medications   gabapentin 800 MG tablet Commonly known as:  NEURONTIN   LORazepam 1 MG tablet Commonly known as:  ATIVAN   temazepam 30 MG capsule Commonly known as:  RESTORIL     TAKE these medications   ADVAIR DISKUS 250-50 MCG/DOSE Aepb Generic drug:  Fluticasone-Salmeterol Take 1 puff by mouth daily.   ARIPiprazole 20 MG tablet Commonly known as:  ABILIFY Place 1 tablet (20 mg total) into feeding tube daily. Start taking on:  03/18/2017   aspirin 81 MG chewable tablet Place 1 tablet (81 mg total) into feeding tube daily. Start taking on:  03/18/2017   atorvastatin 20 MG tablet Commonly known as:  LIPITOR Take 20 mg by mouth at bedtime. Take one tablet daily for hyperlipidemia   baclofen 10 MG tablet Commonly known as:  LIORESAL Take 10 mg by mouth 3 (three) times daily as needed for muscle spasms.   benztropine 2 MG tablet Commonly known as:  COGENTIN Take 1 tablet (2 mg total) by mouth 3 (three) times daily.   divalproex 250 MG DR tablet Commonly known as:  DEPAKOTE Take 1 tablet (250 mg total) by mouth every 8 (eight)  hours.   feeding supplement (ENSURE ENLIVE) Liqd Take 237 mLs by mouth 2 (two) times daily between meals.   folic acid 1 MG tablet Commonly known as:  FOLVITE Place 1 tablet (1 mg total) into feeding tube daily. Start taking on:  03/18/2017   ipratropium-albuterol 0.5-2.5 (3) MG/3ML Soln Commonly known as:  DUONEB Take 3 mLs by nebulization 2 (two) times daily.   levETIRAcetam 1000 MG tablet Commonly known as:  KEPPRA Take 1 tablet (1,000 mg total) by mouth 2 (two) times daily.   levothyroxine 100 MCG tablet Commonly known as:  SYNTHROID, LEVOTHROID Take 1 tablet (100 mcg total) by mouth daily before breakfast.  Start taking on:  03/18/2017   ondansetron 8 MG tablet Commonly known as:  ZOFRAN Take 1 tablet (8 mg total) by mouth every 8 (eight) hours as needed for nausea or vomiting.   PROAIR HFA 108 (90 Base) MCG/ACT inhaler Generic drug:  albuterol Inhale 1-2 puffs every 4 (four) hours as needed into the lungs for shortness of breath.   sennosides 8.8 MG/5ML syrup Commonly known as:  SENOKOT Place 5 mLs into feeding tube 2 (two) times daily.   thiamine 100 MG tablet Place 1 tablet (100 mg total) into feeding tube daily. Start taking on:  03/18/2017   venlafaxine XR 150 MG 24 hr capsule Commonly known as:  EFFEXOR-XR Take 300 mg by mouth daily with breakfast.       Allergies  Allergen Reactions  . Amoxicillin Anaphylaxis and Other (See Comments)    Childhood allergy >> tolerated cephalosporins  Has patient had a PCN reaction causing immediate rash, facial/tongue/throat swelling, SOB or lightheadedness with hypotension: Yes Has patient had a PCN reaction causing severe rash involving mucus membranes or skin necrosis: No Has patient had a PCN reaction that required hospitalization No Has patient had a PCN reaction occurring within the last 10 years: No If all of the above answers are "NO", then may proceed with Cephalosporin use.  Marland Kitchen Penicillins Anaphylaxis and Other  (See Comments)    Childhood allergy Has patient had a PCN reaction causing immediate rash, facial/tongue/throat swelling, SOB or lightheadedness with hypotension: Yes Has patient had a PCN reaction causing severe rash involving mucus membranes or skin necrosis: No Has patient had a PCN reaction that required hospitalization No Has patient had a PCN reaction occurring within the last 10 years: No If all of the above answers are "NO", then may proceed with Cephalosporin use.   Marland Kitchen Phenytoin Other (See Comments)    Reaction:  CNS disorder   . Keflex [Cephalexin] Diarrhea  . Dilaudid [Hydromorphone Hcl] Other (See Comments)    Reaction:  Psychosis   . Haldol [Haloperidol] Other (See Comments)    Reaction:  Psychosis   . Morphine And Related Other (See Comments)    Reaction:  Psychosis    Consultations:  ENT  PCCM   Neurology  CIR  Procedures/Studies: Ct Head Wo Contrast  Result Date: 02/22/2017 CLINICAL DATA:  Patient found unresponsive this morning. EXAM: CT HEAD WITHOUT CONTRAST TECHNIQUE: Contiguous axial images were obtained from the base of the skull through the vertex without intravenous contrast. COMPARISON:  07/28/2016 FINDINGS: Brain: No evidence of acute infarction, hemorrhage, hydrocephalus, extra-axial collection or mass lesion/mass effect. Moderate brain parenchymal volume loss and microangiopathy. Vascular: Calcific atherosclerotic disease at the skullbase. Skull: Normal. Negative for fracture or focal lesion. Sinuses/Orbits: Partial opacification of the ethmoid air cells. The remainder of the visualized paranasal sinuses and mastoid air cells are normally aerated. Other: None. IMPRESSION: No acute intracranial abnormality. Atrophy, chronic microvascular disease. Ethmoid sinusitis. Electronically Signed   By: Fidela Salisbury M.D.   On: 02/22/2017 13:55   Ct Soft Tissue Neck W Contrast  Result Date: 03/06/2017 CLINICAL DATA:  66 y/o  F; hoarseness after extubation.  EXAM: CT NECK WITH CONTRAST TECHNIQUE: Multidetector CT imaging of the neck was performed using the standard protocol following the bolus administration of intravenous contrast. CONTRAST:  62mL ISOVUE-300 IOPAMIDOL (ISOVUE-300) INJECTION 61% COMPARISON:  02/26/2017 cervical MRI.  08/18/2016 CT cervical spine. FINDINGS: Streak and motion artifact from right shoulder hardware partially obscures the lower neck from the level of hyoid to  thoracic outlet. Pharynx and larynx: Aerodigestive tract is widely patent. The exophytic lesion identified. Retropharyngeal fluid collection has resolved. Salivary glands: No inflammation, mass, or stone. Thyroid: Normal. Lymph nodes: None enlarged or abnormal density. Vascular: Right IJ catheter with tip below the field of view extending into superior vena cava. Calcified plaque of bilateral carotid bifurcations with mild right proximal ICA stenosis. Retropharyngeal submucosal course of the proximal right ICA in the neck. Limited intracranial: Negative. Visualized orbits: Negative. Mastoids and visualized paranasal sinuses: Paranasal sinus mucosal thickening with fluid levels in left mastoid opacification likely related to recent intubation. Skeleton: Cervical spondylosis better characterized on prior cervical MRI with discogenic degenerative changes greatest at the C5-C7 levels and prominent upper cervical facet arthrosis. Upper chest: Negative. Other: None. IMPRESSION: 1. Resolution of retropharyngeal fluid collection. No mass or abscess identified. 2. Patent aerodigestive tract. Streak and motion artifact partially obscures the hypopharynx and larynx, however, there is no high-grade stricture. Mild rightward tracheal deviation. 3. Paranasal sinus mucosal thickening, fluid levels, left mastoid effusion likely due to recent intubation. Electronically Signed   By: Kristine Garbe M.D.   On: 03/06/2017 14:27   Mr Brain Wo Contrast  Result Date: 02/26/2017 CLINICAL DATA:   Subacute neuro deficit. Found down with posturing versus seizure. Negative brain MRI, but not moving left arm very much. EXAM: MRI HEAD WITHOUT CONTRAST MRI CERVICAL SPINE WITHOUT CONTRAST TECHNIQUE: Multiplanar, multiecho pulse sequences of the brain and surrounding structures, and cervical spine, to include the craniocervical junction and cervicothoracic junction, were obtained without intravenous contrast. COMPARISON:  Brain MRI from 3 days ago FINDINGS: MRI HEAD FINDINGS Brain: No acute infarction, hemorrhage, hydrocephalus, extra-axial collection or mass lesion. Minor periventricular FLAIR hyperintensity, usually microvascular ischemic. No focal finding to explain arm symptoms. Vascular: Major flow voids are preserved Skull and upper cervical spine: Negative for marrow lesion. Sinuses/Orbits: Small nasopharyngeal cysts. There is chronic sinusitis with diffuse mucosal thickening and sphenoid sinus fluid levels. Progression of bilateral mastoid opacification. Nasopharyngeal fluid is present in the setting of intubation. MRI CERVICAL SPINE FINDINGS Alignment: Normal Vertebrae: No fracture or evidence of aggressive bone lesion. Mild T2 hyperintensity in the C7-T1 disc. Inflammation described below is distant from this level and there is no suspected discitis. Cord: Normal signal and morphology. Posterior Fossa, vertebral arteries, paraspinal tissues: There is extensive edema in the bilateral neck. On axial acquisition both submandibular glands appear expanded and with edematous septate. Fluid tracks around the central compartment and lateral neck ventral to the manubrium. There is a retropharyngeal effusion/collection from C1 to C4 with mass effect on the pharynx. Negative upper mediastinum. These results were called by telephone at the time of interpretation on 02/26/2017 at 6:18 pm to Dr. Roland Rack , who verbally acknowledged these results. Disc levels: C2-3: Mild bilateral facet spurring.  No  impingement C3-4: Right more than left facet spurring that is advanced. Mild right uncovertebral spurring. Advanced right foraminal stenosis. Patent spinal canal C4-5: Facet arthropathy. Asymmetric left uncovertebral spurring. Moderate left foraminal narrowing. C5-6: Degenerative disc disease with narrowing and uncovertebral spurring. Bilateral foraminal impingement. Spinal stenosis with posterior disc osteophyte complex and ligamentum flavum thickening causing slight cord flattening. C6-7: Degenerative disc narrowing and spondylosis. Uncovertebral spurs cause mild left and borderline moderate right foraminal narrowing. Partial ventral subarachnoid space effacement. C7-T1:Spondylosis.  No impingement IMPRESSION: Brain MRI: 1. Stable and without explanation for symptoms. 2. Mild periventricular white matter disease. 3. Sinusitis with ethmoid and sphenoid fluid levels, also seen on prior. Bilateral partial mastoid opacification.  Cervical MRI: 1. Prominent edema in the bilateral neck with retropharyngeal collection. The submandibular glands appears thickened and edematous and this may be submandibular sialadenitis (bilaterality suggesting viral/systemic process). Suppurative infection is not excluded. 2. No acute finding to explain left arm deficit. 3. Disc and facet degeneration as described. Spinal stenosis with mild cord flattening at C5-6. Foraminal narrowings noted above. Electronically Signed   By: Monte Fantasia M.D.   On: 02/26/2017 18:30   Mr Brain Wo Contrast  Result Date: 02/23/2017 CLINICAL DATA:  Unresponsive with agonal respirations. Hypothermia. Patient found unresponsive on 02/22/2017. Patient remains on ventilator support. EXAM: MRI HEAD WITHOUT CONTRAST TECHNIQUE: Multiplanar, multiecho pulse sequences of the brain and surrounding structures were obtained without intravenous contrast. COMPARISON:  CT head 02/22/2017.  No prior MR. FINDINGS: Brain: No evidence for acute infarction, hemorrhage,  mass lesion, hydrocephalus, or extra-axial fluid. Generalized atrophy. Mild subcortical and periventricular T2 and FLAIR hyperintensities, likely chronic microvascular ischemic change. No signs of anoxic injury in the basal ganglia or cortex. No gyriform restricted diffusion to suggest ictal/postictal phenomenon. Vascular: Flow voids are maintained throughout the carotid, basilar, and vertebral arteries. There are no areas of chronic hemorrhage. Skull and upper cervical spine: Unremarkable visualized calvarium, skullbase, and cervical vertebrae. Pituitary, pineal, cerebellar tonsils unremarkable. No upper cervical cord lesions. Sinuses/Orbits: Chronic mucosal thickening of the sinuses. RIGHT cataract extraction. Layering fluid in the sphenoid likely due to recumbent positioning. Other: Layering fluid in the nasopharynx, with BILATERAL mastoid effusions, greater on the LEFT. IMPRESSION: Atrophy and small vessel disease.  No acute intracranial findings. No acute stroke, hemorrhage, or evidence for anoxic injury. No imaging features to support status epilepticus. Flow voids are maintained in the major intracranial vessels. A cause for the patient's symptoms is not identified. Electronically Signed   By: Staci Righter M.D.   On: 02/23/2017 13:17   Mr Cervical Spine Wo Contrast  Result Date: 02/26/2017 CLINICAL DATA:  Subacute neuro deficit. Found down with posturing versus seizure. Negative brain MRI, but not moving left arm very much. EXAM: MRI HEAD WITHOUT CONTRAST MRI CERVICAL SPINE WITHOUT CONTRAST TECHNIQUE: Multiplanar, multiecho pulse sequences of the brain and surrounding structures, and cervical spine, to include the craniocervical junction and cervicothoracic junction, were obtained without intravenous contrast. COMPARISON:  Brain MRI from 3 days ago FINDINGS: MRI HEAD FINDINGS Brain: No acute infarction, hemorrhage, hydrocephalus, extra-axial collection or mass lesion. Minor periventricular FLAIR  hyperintensity, usually microvascular ischemic. No focal finding to explain arm symptoms. Vascular: Major flow voids are preserved Skull and upper cervical spine: Negative for marrow lesion. Sinuses/Orbits: Small nasopharyngeal cysts. There is chronic sinusitis with diffuse mucosal thickening and sphenoid sinus fluid levels. Progression of bilateral mastoid opacification. Nasopharyngeal fluid is present in the setting of intubation. MRI CERVICAL SPINE FINDINGS Alignment: Normal Vertebrae: No fracture or evidence of aggressive bone lesion. Mild T2 hyperintensity in the C7-T1 disc. Inflammation described below is distant from this level and there is no suspected discitis. Cord: Normal signal and morphology. Posterior Fossa, vertebral arteries, paraspinal tissues: There is extensive edema in the bilateral neck. On axial acquisition both submandibular glands appear expanded and with edematous septate. Fluid tracks around the central compartment and lateral neck ventral to the manubrium. There is a retropharyngeal effusion/collection from C1 to C4 with mass effect on the pharynx. Negative upper mediastinum. These results were called by telephone at the time of interpretation on 02/26/2017 at 6:18 pm to Dr. Roland Rack , who verbally acknowledged these results. Disc levels: C2-3: Mild bilateral  facet spurring.  No impingement C3-4: Right more than left facet spurring that is advanced. Mild right uncovertebral spurring. Advanced right foraminal stenosis. Patent spinal canal C4-5: Facet arthropathy. Asymmetric left uncovertebral spurring. Moderate left foraminal narrowing. C5-6: Degenerative disc disease with narrowing and uncovertebral spurring. Bilateral foraminal impingement. Spinal stenosis with posterior disc osteophyte complex and ligamentum flavum thickening causing slight cord flattening. C6-7: Degenerative disc narrowing and spondylosis. Uncovertebral spurs cause mild left and borderline moderate right  foraminal narrowing. Partial ventral subarachnoid space effacement. C7-T1:Spondylosis.  No impingement IMPRESSION: Brain MRI: 1. Stable and without explanation for symptoms. 2. Mild periventricular white matter disease. 3. Sinusitis with ethmoid and sphenoid fluid levels, also seen on prior. Bilateral partial mastoid opacification. Cervical MRI: 1. Prominent edema in the bilateral neck with retropharyngeal collection. The submandibular glands appears thickened and edematous and this may be submandibular sialadenitis (bilaterality suggesting viral/systemic process). Suppurative infection is not excluded. 2. No acute finding to explain left arm deficit. 3. Disc and facet degeneration as described. Spinal stenosis with mild cord flattening at C5-6. Foraminal narrowings noted above. Electronically Signed   By: Monte Fantasia M.D.   On: 02/26/2017 18:30   Dg Chest Port 1 View  Result Date: 03/17/2017 CLINICAL DATA:  Shortness of breath. EXAM: PORTABLE CHEST 1 VIEW COMPARISON:  03/02/2017.  03/01/2017. FINDINGS: Interim removal of lines and tubes. Mediastinum and hilar structures normal. Heart size normal. Mild basilar atelectasis. No pleural effusion or pneumothorax. Prior right shoulder replacement . IMPRESSION: 1.  Interim removal of lines and tubes. 2. Low lung volumes with mild basilar atelectasis, chest is otherwise unremarkable . Electronically Signed   By: Marcello Moores  Register   On: 03/17/2017 08:36   Dg Chest Port 1 View  Result Date: 03/02/2017 CLINICAL DATA:  Hypoxia EXAM: PORTABLE CHEST 1 VIEW COMPARISON:  March 01, 2017 FINDINGS: Endotracheal tube tip is 3.3 cm above the carina. Central catheter tip is in the superior vena cava. Nasogastric tube tip and side port are below the diaphragm. No pneumothorax. There is atelectatic change in the left mid lung and both base regions. There is focal consolidation more medially in the left base. Heart is upper normal in size with pulmonary vascularity within  normal limits. No adenopathy. 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 There is healing trauma involving the left proximal humerus, stable. Bones are osteoporotic. IMPRESSION: Tube and catheter positions as described without pneumothorax. Areas of patchy atelectasis bilaterally with consolidation, likely pneumonia, left base. There is increase in atelectasis in the right base compared to 1 day prior. Left lung appears stable. Stable cardiac silhouette. Electronically Signed   By: Lowella Grip III M.D.   On: 03/02/2017 07:06   Dg Chest Port 1 View  Result Date: 03/01/2017 CLINICAL DATA:  Hypoxia EXAM: PORTABLE CHEST 1 VIEW COMPARISON:  February 28, 2017 FINDINGS: Endotracheal tube tip is 0.9 cm above the carina. Central catheter tip is in the superior vena cava. Nasogastric tube tip and side port below the diaphragm. No pneumothorax. There is mild airspace opacity in the left mid lung and left base regions. Lungs elsewhere are clear. Heart is upper normal in size with pulmonary vascularity within normal limits. No adenopathy. 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 There is evidence of prior fracture of the proximal left humerus with remodeling. Bones are osteoporotic. IMPRESSION: Tube and catheter positions as described without pneumothorax. Patchy airspace opacity in the left mid lung and left base, likely foci of pneumonia. Lungs elsewhere clear. Stable cardiac silhouette. Healing  fracture proximal left humerus, stable. Electronically Signed   By: Lowella Grip III M.D.   On: 03/01/2017 07:10   Dg Chest Port 1 View  Result Date: 02/28/2017 CLINICAL DATA:  Respiratory failure EXAM: PORTABLE CHEST 1 VIEW COMPARISON:  Yesterday FINDINGS: Endotracheal tube tip 13 mm above the carina. An orogastric tube reaches the stomach. Right IJ central line with tip at the SVC level. Reticular opacity symmetrically at the bases, stable. No effusion or pneumothorax. Normal heart size. Healing proximal  left humerus fracture. Right glenohumeral arthroplasty. IMPRESSION: 1. Endotracheal tube tip 13 mm above the carina. 2. Stable interstitial type opacities at the bases of the low volume lungs which could be atelectasis or infection. Electronically Signed   By: Monte Fantasia M.D.   On: 02/28/2017 08:23   Dg Chest Port 1 View  Result Date: 02/27/2017 CLINICAL DATA:  Hypoxia EXAM: PORTABLE CHEST 1 VIEW COMPARISON:  February 26, 2017 FINDINGS: Endotracheal tube tip is 1.9 cm above the carina. Nasogastric to tip and side port in stomach. Central catheter tip is in the superior vena cava near the cavoatrial junction. No pneumothorax. There is patchy airspace opacity in the left base, similar 1 day prior. There is mild right base atelectasis. No new opacity evident. Heart is upper normal in size with pulmonary vascularity within normal limits. No adenopathy. There is a total shoulder replacement on the right. IMPRESSION: Tube and catheter positions as described without pneumothorax. Persistent patchy opacity left base, stable from 1 day prior and likely due to patchy pneumonia. Mild right base atelectasis. Lungs elsewhere clear. Stable cardiac silhouette. Electronically Signed   By: Lowella Grip III M.D.   On: 02/27/2017 07:06   Dg Chest Port 1 View  Result Date: 02/26/2017 CLINICAL DATA:  Respiratory failure, routine. EXAM: PORTABLE CHEST 1 VIEW COMPARISON:  One-view chest x-ray 02/25/2017. FINDINGS: Endotracheal tube terminates 2 cm above the carina and could be pulled back for more optimal positioning. The right hemidiaphragm is now elevated. Interstitial edema is similar to the prior exam. Aeration at the left base is improved. IMPRESSION: 1. Endotracheal tube terminates 2 cm above the carina and could be pulled back 1-2 cm for more optimal positioning. 2. Persistent low lung volumes with increased elevation of the right hemidiaphragm. Unknown etiology. 3. Improving aeration of both lung bases.  Electronically Signed   By: San Morelle M.D.   On: 02/26/2017 08:31   Dg Chest Port 1 View  Result Date: 02/25/2017 CLINICAL DATA:  Respiratory failure EXAM: PORTABLE CHEST 1 VIEW COMPARISON:  02/24/2017 FINDINGS: Support devices are stable. Low lung volumes with bibasilar and perihilar atelectasis. Heart is normal size. No visible significant effusions. IMPRESSION: Low lung volumes, bibasilar atelectasis. Electronically Signed   By: Rolm Baptise M.D.   On: 02/25/2017 07:37   Dg Chest Port 1 View  Result Date: 02/24/2017 CLINICAL DATA:  Shortness of breath. Aspiration pneumonia. Ventilator support. EXAM: PORTABLE CHEST 1 VIEW COMPARISON:  02/23/2017 FINDINGS: Endotracheal tube tip is 3 cm above the carina. Nasogastric tube enters the stomach. Right internal jugular central line tip in the SVC at the azygos level. Radiographic worsening of infiltrate and volume loss in the left lower lobe and a lesser extent in the right lower lung. No qualitatively new finding. IMPRESSION: Lines and tubes well positioned. Radiographic worsening of pulmonary infiltrates and volume loss left more than right. Electronically Signed   By: Nelson Chimes M.D.   On: 02/24/2017 07:53   Dg Chest [facility]  Result Date: 02/23/2017 CLINICAL DATA:  Central line placement. EXAM: PORTABLE CHEST 1 VIEW COMPARISON:  Earlier the same day. FINDINGS: 2032 hours. Endotracheal tube tip is 2.4 cm above the base of the carina. NG tube tip is in the proximal stomach. Proximal port of the NG tube is in the region of the EG junction. Right IJ central line is new in the interval with tip overlying the mid SVC level. No right pneumothorax. Lung volumes are low with persistent patchy opacity at the lung bases, left greater than right compatible with atelectasis and/or pneumonia. The cardiopericardial silhouette is within normal limits for size. Nonacute left humeral neck fracture noted. Patient is status post right shoulder replacement.  IMPRESSION: 1. Interval placement of right IJ central line with tip overlying the mid SVC level and no evidence for right pneumothorax. 2. Otherwise stable exam. Electronically Signed   By: Misty Stanley M.D.   On: 02/23/2017 21:00   Dg Chest Port 1 View  Result Date: 02/23/2017 CLINICAL DATA:  Shortness of breath. Endotracheal slice ventilator support. EXAM: PORTABLE CHEST 1 VIEW COMPARISON:  02/22/2017 FINDINGS: Endotracheal tube tip is 2 cm above the carina. Nasogastric tube enters stomach. There is mild patchy atelectasis at both lung bases. Upper lobes are clear. Healing fracture of the left proximal humerus as seen previously. IMPRESSION: Endotracheal tube and nasogastric tube well positioned. Mild patchy basilar atelectasis. Electronically Signed   By: Nelson Chimes M.D.   On: 02/23/2017 07:12   Dg Chest Port 1 View  Result Date: 02/22/2017 CLINICAL DATA:  Acute respiratory failure. EXAM: PORTABLE CHEST 1 VIEW COMPARISON:  04/24/2016 FINDINGS: Endotracheal tube terminates 19 mm above the carina. Enteric catheter overlies gastric body, side hole at the expected location of the GE junction. Cardiomediastinal silhouette is normal. Mediastinal contours appear intact. Calcific atherosclerotic disease of the aorta. There is no evidence of focal airspace consolidation, pleural effusion or pneumothorax. Osseous structures are without acute abnormality. Soft tissues are grossly normal. IMPRESSION: Support apparatus as described. No evidence of pneumothorax. Electronically Signed   By: Fidela Salisbury M.D.   On: 02/22/2017 21:37   Dg Chest Port 1 View  Addendum Date: 02/22/2017   ADDENDUM REPORT: 02/22/2017 11:25 ADDENDUM: These results were called by telephone on 02/22/2017 at 11:15 am to Dr. [name] , who verbally acknowledged these results. Electronically Signed   By: Titus Dubin M.D.   On: 02/22/2017 11:25   Result Date: 02/22/2017 CLINICAL DATA:  Check endotracheal tube and OG tube  placement. EXAM: PORTABLE CHEST 1 VIEW COMPARISON:  Chest x-ray dated Aug 18, 2016. FINDINGS: Endotracheal tube with the tip in the proximal right mainstem bronchus. Enteric tube seen entering the stomach, with the tip beyond the field of view. The cardiomediastinal silhouette is normal in size. Mild pulmonary vascular congestion. No focal consolidation, pleural effusion, or pneumothorax. No acute osseous abnormality. Prior reverse right shoulder arthroplasty. IMPRESSION: 1. Endotracheal tube with the tip in the proximal right mainstem bronchus. Recommend retraction 5 cm. 2. Enteric tube seen entering the stomach, with the distal side port in the gastric body. 3. Mild pulmonary vascular congestion. These results will be called to the ordering clinician or representative by the Radiologist Assistant, and communication documented in the PACS or zVision Dashboard. Electronically Signed: By: Titus Dubin M.D. On: 02/22/2017 11:10   Dg Abd Portable 1 View  Result Date: 02/22/2017 CLINICAL DATA:  Assess positioning of orogastric tube. EXAM: PORTABLE ABDOMEN - 1 VIEW COMPARISON:  No recent studies in PACs  FINDINGS: The proximal port of the orogastric tube lies at the level of the GE junction with the tip in the proximal body. There is a moderate amount of gas within colon and some within small bowel. No free extraluminal collections are observed. External pacemaker defibrillator pads are present. IMPRESSION: The proximal port of the orogastric tube lies just at the GE junction and the tip is in the proximal gastric body. Advancement by 5-10 cm is recommended to assure that the proximal port remains below the GE junction. Electronically Signed   By: David  Martinique M.D.   On: 02/22/2017 11:08    LTM-EEG 02/23/17 This was a markedly abnormal continuous video EEG due to loss of normal background, diffuse sharply contoured slowing, and a partial burst-suppression pattern. This was indicative of a severe global  cerebral disturbance with periods of attenuation likely due to medications. No seizures were seen.   EEG 03/03/17  Findings:  There is no posterior dominant alpha rhythm.  Background frequencies are about 7 Hz at maximum.  No focal slowing is present. The patient is agitated and uncooperative during the recording.  Muscle artifact is abundant due to the agitation.  She does not get drowsy or sleep.  Throughout the recording there are no epileptiform discharges or electrographic seizures present.  Impression:  This is an abnormal EEG.  There is mild generalized slowing of brain activity, which is non-specific, but may be due to toxic, metabolic, infectious, or hypoxic etiologies.  Clinical correlation is recommended.  The patient is not in non-convulsive status epilepticus.  Subjective: Seen and examined at bedside and was doing well. Still slightly confused but awake and alert. No complaints. Happy she was going to SNF.  Discharge Exam: Vitals:   03/17/17 0845 03/17/17 0900  BP: 117/75   Pulse: 85   Resp:    Temp: 99.1 F (37.3 C)   SpO2: 92% 97%   Vitals:   03/17/17 0129 03/17/17 0600 03/17/17 0845 03/17/17 0900  BP: 125/79 (!) 141/73 117/75   Pulse: 72 81 85   Resp: 18 18    Temp: 98.2 F (36.8 C) 97.7 F (36.5 C) 99.1 F (37.3 C)   TempSrc: Oral Oral Oral   SpO2: 99% 92% 92% 97%  Weight: 81.8 kg (180 lb 5.4 oz)     Height:       General: Pt is alert, awake, not in acute distress Cardiovascular: RRR, S1/S2 +, no rubs, no gallops Respiratory: Diminished bilaterally, no wheezing, no rhonchi Abdominal: Soft, NT, ND, bowel sounds + Extremities: no edema, no cyanosis  The results of significant diagnostics from this hospitalization (including imaging, microbiology, ancillary and laboratory) are listed below for reference.    Microbiology: No results found for this or any previous visit (from the past 240 hour(s)).   Labs: BNP (last 3 results) No results for input(s): BNP  in the last 8760 hours. Basic Metabolic Panel: Recent Labs  Lab 03/12/17 0329 03/13/17 0523 03/14/17 0534 03/15/17 0258 03/16/17 0309 03/17/17 0310  NA 139 138  --  139 140 139  K 3.8 3.2*  --  3.7 2.8* 3.9  CL 99* 98*  --  95* 96* 101  CO2 30 32  --  34* 33* 32  GLUCOSE 89 111*  --  98 84 87  BUN 13 19  --  15 15 15   CREATININE 0.66 0.61  --  0.59 0.59 0.55  CALCIUM 8.6* 8.5*  --  8.9 8.3* 8.6*  MG 2.2 2.3 2.2 2.1  2.0 2.1  PHOS 3.2 3.8 3.3 3.1 3.3 3.1   Liver Function Tests: Recent Labs  Lab 03/16/17 0309 03/17/17 0310  AST 17 19  ALT 8* 10*  ALKPHOS 51 53  BILITOT 0.7 0.5  PROT 6.0* 6.4*  ALBUMIN 2.9* 3.0*   No results for input(s): LIPASE, AMYLASE in the last 168 hours. No results for input(s): AMMONIA in the last 168 hours. CBC: Recent Labs  Lab 03/15/17 1029 03/16/17 0309 03/17/17 0310  WBC 15.7* 10.7* 9.0  NEUTROABS 12.0* 6.8 5.1  HGB 9.1* 8.5* 9.2*  HCT 28.0* 26.8* 29.5*  MCV 103.7* 105.1* 104.2*  PLT 354 388 344   Cardiac Enzymes: No results for input(s): CKTOTAL, CKMB, CKMBINDEX, TROPONINI in the last 168 hours. BNP: Invalid input(s): POCBNP CBG: Recent Labs  Lab 03/16/17 1140 03/16/17 1640 03/16/17 2152 03/17/17 0620 03/17/17 1109  GLUCAP 79 78 86 84 76   D-Dimer No results for input(s): DDIMER in the last 72 hours. Hgb A1c No results for input(s): HGBA1C in the last 72 hours. Lipid Profile Recent Labs    03/17/17 0310  CHOL 120  HDL 38*  LDLCALC 56  TRIG 130  CHOLHDL 3.2   Thyroid function studies Recent Labs    03/17/17 0848  TSH 34.621*   Anemia work up No results for input(s): VITAMINB12, FOLATE, FERRITIN, TIBC, IRON, RETICCTPCT in the last 72 hours. Urinalysis    Component Value Date/Time   COLORURINE COLORLESS (A) 03/02/2017 1045   APPEARANCEUR CLEAR 03/02/2017 1045   LABSPEC 1.006 03/02/2017 1045   PHURINE 7.0 03/02/2017 1045   GLUCOSEU NEGATIVE 03/02/2017 1045   HGBUR NEGATIVE 03/02/2017 1045   HGBUR  negative 09/16/2009 0954   BILIRUBINUR NEGATIVE 03/02/2017 1045   KETONESUR NEGATIVE 03/02/2017 1045   PROTEINUR NEGATIVE 03/02/2017 1045   UROBILINOGEN 0.2 09/05/2014 2126   NITRITE NEGATIVE 03/02/2017 [place] 03/02/2017 1045   Sepsis Labs Invalid input(s): PROCALCITONIN,  WBC,  LACTICIDVEN Microbiology No results found for this or any previous visit (from the past 240 hour(s)).  Time coordinating discharge: 35 minutes  SIGNED:  Kerney Elbe, DO [facility] 03/17/2017, 12:07 PM Pager [phone]  If 7PM-7AM, please contact night-coverage [email or web address] Password [email or web address]

## 2017-03-17 NOTE — Clinical Social Work Placement (Signed)
   CLINICAL SOCIAL WORK PLACEMENT  NOTE  Date:  03/17/2017  Patient Details  Name: Dominique Acosta MRN: [id number] Date of Birth: January 09, 1951  Clinical Social Work is seeking post-discharge placement for this patient at the [place] level of care (*CSW will initial, date and re-position this form in  chart as items are completed):  Yes   Patient/family provided with [place] Work Department's list of facilities offering this level of care within the geographic area requested by the patient (or if unable, by the patient's family).  Yes   Patient/family informed of their freedom to choose among providers that offer the needed level of care, that participate in Medicare, Medicaid or managed care program needed by the patient, have an available bed and are willing to accept the patient.  Yes   Patient/family informed of [facility]'s ownership interest in [facility] and [facility], as well as of the fact that they are under no obligation to receive care at these facilities.  PASRR submitted to EDS on 03/15/17     PASRR number received on 03/16/17     Existing PASRR number confirmed on       FL2 transmitted to all facilities in geographic area requested by pt/family on 03/15/17     FL2 transmitted to all facilities within larger geographic area on       Patient informed that his/her managed care company has contracts with or will negotiate with certain facilities, including the following:        Yes   Patient/family informed of bed offers received.  Patient chooses bed at [place]     Physician recommends and patient chooses bed at      Patient to be transferred to [facility] on 03/17/17.  Patient to be transferred to facility by [facility]     Patient family notified on 03/17/17 of transfer.  Name of family member notified:  [name], sister     PHYSICIAN       Additional Comment:     _______________________________________________ Benard Halsted, [place] 03/17/2017, 3:35 PM

## 2017-03-17 NOTE — Care Management Note (Signed)
Case Management Note  Patient Details  Name: Burnetta Kohls MRN: [id number] Date of Birth: Jun 12, 1950  Subjective/Objective:                    Action/Plan: Pt discharging to [facility] today. No further needs per CM.  Expected Discharge Date:  03/17/17               Expected Discharge Plan:  Skilled Nursing Facility  In-House Referral:  Clinical Social Work  Discharge planning Services  CM Consult  Post Acute Care Choice:    Choice offered to:     DME Arranged:    DME Agency:     HH Arranged:    [place] Agency:     Status of Service:  Completed, signed off  If discussed at [employer], dates discussed:    Additional Comments:  Pollie Friar, RN 03/17/2017, 12:10 PM

## 2017-03-17 NOTE — Progress Notes (Signed)
Patient will DC to: [facility] Anticipated DC date: 03/17/17 Family notified: Sister Transport by: Corey Harold   Per MD patient ready for DC to [place]. RN, patient, patient's family, and facility notified of DC. Discharge Summary sent to facility. RN given number for report [phone] Room 101). DC packet on chart. Ambulance transport requested for patient.   CSW signing off.  Cedric Fishman, [place] Social Worker [phone]

## 2017-03-17 NOTE — Progress Notes (Signed)
  Speech Language Pathology Treatment: Dysphagia  Patient Details Name: Dominique Acosta MRN: [id number] DOB: 04/09/1951 Today's Date: 03/17/2017 Time: 7225-7505 SLP Time Calculation (min) (ACUTE ONLY): 13 min  Assessment / Plan / Recommendation Clinical Impression  Pt with improved participation today, willingness to eat.  Observed with consumption of dys3 breakfast, thin liquids.  Excellent toleration, no clinical s/s of an ongoing dysphagia, no overt concerns for aspiration - deficits appear to have resolved. Continue soft mechanical diet due to absence of teeth. SLP services will sign off - No SLP f/u at SNF is warranted.   HPI HPI: Pt is a 66 y.o.female former smoker who was found unresponsive after she called EMS. Patient had agonal respirations and hyperthermia. She required endotracheal intubation for airway protection 11/14. ENT was consulted when she did not have a cuff leak and MRI showed retropharyngeal fluid collection (resolved on CT Neck 11/26). Pt self-extubated on 11/24. Known history of COPD, coronary disease, MI, depression, schizophrenia, psychosis, dystonia, seizure disorder, substance abuse, chronic pain, and hypothyroidism.  Pt developed subglottic stenonis s/p prolonged intubation.  03/14/17 underwent CO2 laser excision of subglottic stenosis with balloon dilation.       SLP Plan  All goals met       Recommendations  Diet recommendations: Dysphagia 3 (mechanical soft);Thin liquid Liquids provided via: Cup;Straw Medication Administration: Whole meds with puree Supervision: Patient able to self feed Postural Changes and/or Swallow Maneuvers: Seated upright 90 degrees                Oral Care Recommendations: Oral care BID Follow up Recommendations: None SLP Visit Diagnosis: Dysphagia, oropharyngeal phase (R13.12) Plan: All goals met       GO                [name] 03/17/2017, 9:30 AM

## 2017-03-17 NOTE — Progress Notes (Signed)
Report called to [place] at [facility].  Caetano Oberhaus, Tivis Ringer, RN

## 2017-03-21 ENCOUNTER — Non-Acute Institutional Stay (SKILLED_NURSING_FACILITY): Payer: Medicare Other | Admitting: Adult Health

## 2017-03-21 ENCOUNTER — Encounter: Payer: Self-pay | Admitting: Adult Health

## 2017-03-21 ENCOUNTER — Emergency Department (HOSPITAL_COMMUNITY)
Admission: EM | Admit: 2017-03-21 | Discharge: 2017-03-23 | Disposition: A | Discharge status: Other Acute Inpatient Hospital | Payer: Medicare Other | Attending: Emergency Medicine | Admitting: Emergency Medicine

## 2017-03-21 DIAGNOSIS — G249 Dystonia, unspecified: Secondary | ICD-10-CM | POA: Diagnosis not present

## 2017-03-21 DIAGNOSIS — F250 Schizoaffective disorder, bipolar type: Secondary | ICD-10-CM

## 2017-03-21 DIAGNOSIS — Z87891 Personal history of nicotine dependence: Secondary | ICD-10-CM | POA: Insufficient documentation

## 2017-03-21 DIAGNOSIS — J449 Chronic obstructive pulmonary disease, unspecified: Secondary | ICD-10-CM | POA: Diagnosis not present

## 2017-03-21 DIAGNOSIS — K5909 Other constipation: Secondary | ICD-10-CM | POA: Diagnosis not present

## 2017-03-21 DIAGNOSIS — Z7982 Long term (current) use of aspirin: Secondary | ICD-10-CM | POA: Insufficient documentation

## 2017-03-21 DIAGNOSIS — Z79899 Other long term (current) drug therapy: Secondary | ICD-10-CM | POA: Insufficient documentation

## 2017-03-21 DIAGNOSIS — I252 Old myocardial infarction: Secondary | ICD-10-CM | POA: Insufficient documentation

## 2017-03-21 DIAGNOSIS — Z046 Encounter for general psychiatric examination, requested by authority: Secondary | ICD-10-CM | POA: Diagnosis present

## 2017-03-21 DIAGNOSIS — Z8614 Personal history of Methicillin resistant Staphylococcus aureus infection: Secondary | ICD-10-CM | POA: Insufficient documentation

## 2017-03-21 DIAGNOSIS — J386 Stenosis of larynx: Secondary | ICD-10-CM | POA: Diagnosis not present

## 2017-03-21 DIAGNOSIS — I2510 Atherosclerotic heart disease of native coronary artery without angina pectoris: Secondary | ICD-10-CM | POA: Diagnosis not present

## 2017-03-21 DIAGNOSIS — G40909 Epilepsy, unspecified, not intractable, without status epilepticus: Secondary | ICD-10-CM | POA: Diagnosis not present

## 2017-03-21 DIAGNOSIS — E034 Atrophy of thyroid (acquired): Secondary | ICD-10-CM | POA: Diagnosis not present

## 2017-03-21 DIAGNOSIS — R4182 Altered mental status, unspecified: Secondary | ICD-10-CM | POA: Insufficient documentation

## 2017-03-21 DIAGNOSIS — E785 Hyperlipidemia, unspecified: Secondary | ICD-10-CM

## 2017-03-21 DIAGNOSIS — Z856 Personal history of leukemia: Secondary | ICD-10-CM | POA: Diagnosis not present

## 2017-03-21 DIAGNOSIS — I10 Essential (primary) hypertension: Secondary | ICD-10-CM | POA: Diagnosis not present

## 2017-03-21 DIAGNOSIS — Z008 Encounter for other general examination: Secondary | ICD-10-CM

## 2017-03-21 NOTE — ED Notes (Signed)
Bed: [facility] Expected date:  Expected time:  Means of arrival:  Comments: 66 yo F  Fall from [place]

## 2017-03-21 NOTE — ED Triage Notes (Signed)
Pt comes from [facility], pt has markered increased aggressiveness with agitation. Psych hx  Redirection not working, attempt to to give po ativan, pills not working.facility wants a psych evaluation. Fall tonight c/o of upper back pain.  V/s 132/72, pulse 92, rr16, spo2 98 room air.

## 2017-03-21 NOTE — Progress Notes (Addendum)
Location:   [place] Room Number: 101 A Place of Service:  SNF (31)   CODE STATUS: DNR  Allergies  Allergen Reactions  . Amoxicillin Anaphylaxis and Other (See Comments)    Childhood allergy >> tolerated cephalosporins  Has patient had a PCN reaction causing immediate rash, facial/tongue/throat swelling, SOB or lightheadedness with hypotension: Yes Has patient had a PCN reaction causing severe rash involving mucus membranes or skin necrosis: No Has patient had a PCN reaction that required hospitalization No Has patient had a PCN reaction occurring within the last 10 years: No If all of the above answers are "NO", then may proceed with Cephalosporin use.  Marland Kitchen Penicillins Anaphylaxis and Other (See Comments)    Childhood allergy Has patient had a PCN reaction causing immediate rash, facial/tongue/throat swelling, SOB or lightheadedness with hypotension: Yes Has patient had a PCN reaction causing severe rash involving mucus membranes or skin necrosis: No Has patient had a PCN reaction that required hospitalization No Has patient had a PCN reaction occurring within the last 10 years: No If all of the above answers are "NO", then may proceed with Cephalosporin use.   Marland Kitchen Phenytoin Other (See Comments)    Reaction:  CNS disorder   . Keflex [Cephalexin] Diarrhea  . Dilaudid [Hydromorphone Hcl] Other (See Comments)    Reaction:  Psychosis   . Haldol [Haloperidol] Other (See Comments)    Reaction:  Psychosis   . Morphine And Related Other (See Comments)    Reaction:  Psychosis     Chief Complaint  Patient presents with  . Hospitalization Follow-up    Hospital Follow up    HPI:  She is a 66 year old with a history of COPD; CAD schizophrenia; dystonia and seizures; who has been hospitalized for MRSA pneumonia; acute respiratory distress; retropharyngeal edema and dysphagia. 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 She has been  nonadherent with her medications. She is unable to participate in the hpi or ros. There are no nursing concerns at this time. Her goal is to return back home; however; more than likely this is not appropriate placement. She will require long term placement at either snf or alf.    Past Medical History:  Diagnosis Date  . Arm pain   . Chronic pain   . COPD (chronic obstructive pulmonary disease) ([place])   . Coronary artery disease   . Depression   . Dystonia   . Epilepsy ([place])   . High cholesterol   . Humerus fracture   . Hypothyroid   . Insomnia   . Leukemia ([place])   . Myocardial infarct ([place])   . Psychosis ([place])   . Schizophrenia ([place])   . Seizure [facility])     Past Surgical History:  Procedure Laterality Date  . BALLOON DILATION N/A 03/14/2017   Procedure: BALLOON DILATION;  Surgeon: Helayne Seminole, MD;  Location: [place];  Service: ENT;  Laterality: N/A;  . EXTERNAL EAR SURGERY    . HUMERUS FRACTURE SURGERY    . KENALOG INJECTION N/A 03/14/2017   Procedure: KENALOG INJECTION;  Surgeon: Helayne Seminole, MD;  Location: [place];  Service: ENT;  Laterality: N/A;  . MICROLARYNGOSCOPY WITH LASER N/A 03/14/2017   Procedure: MICROLARYNGOSCOPY WITH LASER;  Surgeon: Helayne Seminole, MD;  Location: [place];  Service: ENT;  Laterality: N/A;  . SHOULDER SURGERY      Social History   Socioeconomic History  . Marital status: Single  Spouse name: Not on file  . Number of children: Not on file  . Years of education: Not on file  . Highest education level: Not on file  Social Needs  . Financial resource strain: Not on file  . Food insecurity - worry: Not on file  . Food insecurity - inability: Not on file  . Transportation needs - medical: Not on file  . Transportation needs - non-medical: Not on file  Occupational History  . Not on file  Tobacco Use  . Smoking status: Former Smoker    Packs/day: 2.00    Years: 41.00    Pack years: 82.00    Types: Cigarettes  . Smokeless  tobacco: Never Used  Substance and Sexual Activity  . Alcohol use: No  . Drug use: No  . Sexual activity: No  Other Topics Concern  . Not on file  Social History Narrative   Admitted to [facility] daily   Alcohol none   No Advanced Directive.      Family History  Problem Relation Age of Onset  . Cancer Mother   . Cancer Father       VITAL SIGNS BP (!) 147/72   Pulse 100   Temp 98 F (36.7 C)   Resp 18   Ht 5\' 1"  (1.549 m)   Wt 180 lb 8 oz (81.9 kg)   BMI 34.11 kg/m    Outpatient Encounter Medications as of 03/21/2017  Medication Sig  . ADVAIR DISKUS 250-50 MCG/DOSE AEPB Take 1 puff by mouth daily.  . ARIPiprazole (ABILIFY) 20 MG tablet Place 1 tablet (20 mg total) into feeding tube daily.  Marland Kitchen aspirin EC 81 MG tablet Take 81 mg by mouth daily.  Marland Kitchen atorvastatin (LIPITOR) 20 MG tablet Take 20 mg by mouth at bedtime. Take one tablet daily for hyperlipidemia   . baclofen (LIORESAL) 10 MG tablet Take 10 mg by mouth 3 (three) times daily as needed for muscle spasms.  . benztropine (COGENTIN) 2 MG tablet Take 1 tablet (2 mg total) by mouth 3 (three) times daily.  . divalproex (DEPAKOTE) 250 MG DR tablet Take 1 tablet (250 mg total) by mouth every 8 (eight) hours.  . folic acid (FOLVITE) 1 MG tablet Place 1 tablet (1 mg total) into feeding tube daily.  Marland Kitchen ipratropium-albuterol (DUONEB) 0.5-2.5 (3) MG/3ML SOLN Take 3 mLs by nebulization 2 (two) times daily.  Marland Kitchen levETIRAcetam (KEPPRA) 1000 MG tablet Take 1 tablet (1,000 mg total) by mouth 2 (two) times daily.  Marland Kitchen levothyroxine (SYNTHROID, LEVOTHROID) 100 MCG tablet Take 1 tablet (100 mcg total) by mouth daily before breakfast.  . Nutritional Supplements (NUTRITIONAL SUPPLEMENT PO) Take by mouth. House 2.0 - Give by mouth two times daily for supplement  . ondansetron (ZOFRAN) 8 MG tablet Take 1 tablet (8 mg total) by mouth every 8 (eight) hours as needed for nausea or vomiting.  Marland Kitchen PROAIR HFA 108 (90  Base) MCG/ACT inhaler Inhale 1 puff into the lungs every 4 (four) hours as needed for shortness of breath.   . senna (SENOKOT) 8.6 MG TABS tablet Take 1 tablet by mouth 2 (two) times daily.  Marland Kitchen thiamine 100 MG tablet Take 100 mg by mouth daily.  Marland Kitchen venlafaxine XR (EFFEXOR-XR) 150 MG 24 hr capsule Take 300 mg by mouth daily with breakfast.  . [DISCONTINUED] aspirin 81 MG chewable tablet Place 1 tablet (81 mg total) into feeding tube daily. (Patient not taking: Reported on 03/21/2017)  . [  DISCONTINUED] feeding supplement, ENSURE ENLIVE, (ENSURE ENLIVE) LIQD Take 237 mLs by mouth 2 (two) times daily between meals. (Patient not taking: Reported on 03/21/2017)  . [DISCONTINUED] sennosides (SENOKOT) 8.8 MG/5ML syrup Place 5 mLs into feeding tube 2 (two) times daily. (Patient not taking: Reported on 03/21/2017)  . [DISCONTINUED] thiamine 100 MG tablet Place 1 tablet (100 mg total) into feeding tube daily. (Patient not taking: Reported on 03/21/2017)   No facility-administered encounter medications on file as of 03/21/2017.      SIGNIFICANT DIAGNOSTIC EXAMS  PREVIOUS  08-18-16: ct of cervical spine: 1. No evidence of acute cervical spine injury. 2. Soft tissue swelling around the partly visualized left clavicle, consider radiography.   08-18-16: left elbow x-ray: Curvilinear lucency through the coronoid process of the ulna, which may represent an avulsion fracture or an osteophyte. Please correlate to possible point tenderness.   08-18-16: left shoulder x-ray: Comminuted impacted intra-articular fracture of the left humeral head/surgical neck with mild posterior inferior subluxation, which may be due to intracapsular hematoma.   08-18-16: chest x-ray: No evidence of acute rib fracture.  TODAY:  02-22-17; chest x-ray: The proximal port of the orogastric tube lies just at the GE junction and the tip is in the proximal gastric body. Advancement by 5-10 cm is recommended to assure that the proximal port  remains below the GE junction.  02-22-17: ct of head: No acute intracranial abnormality. Atrophy, chronic microvascular disease. Ethmoid sinusitis.  02-23-17: MRI of brain: Atrophy and small vessel disease.  No acute intracranial findings. No acute stroke, hemorrhage, or evidence for anoxic injury. No imaging features to support status epilepticus. Flow voids are maintained in the major intracranial vessels. A cause for the patient's symptoms is not identified.  02-26-17: MRI of brain and cervical spine:  Brain MRI: 1. Stable and without explanation for symptoms. 2. Mild periventricular white matter disease. 3. Sinusitis with ethmoid and sphenoid fluid levels, also seen on prior. Bilateral partial mastoid opacification. Cervical MRI: 1. Prominent edema in the bilateral neck with retropharyngeal collection. The submandibular glands appears thickened and edematous and this may be submandibular sialadenitis (bilaterality suggesting viral/systemic process). Suppurative infection is not excluded. 2. No acute finding to explain left arm deficit. 3. Disc and facet degeneration as described. Spinal stenosis with mild cord flattening at C5-6. Foraminal narrowings noted above.  03-02-17: chest x-ray: Tube and catheter positions as described without pneumothorax. Areas of patchy atelectasis bilaterally with consolidation, likely pneumonia, left base. There is increase in atelectasis in the right base compared to 1 day prior. Left lung appears stable. Stable cardiac silhouette.  03-03-17: EEG: Impression:  This is an abnormal EEG.  There is mild generalized slowing of brain activity, which is non-specific, but may be due to toxic, metabolic, infectious, or hypoxic etiologies.  Clinical correlation is recommended.  The patient is not in non-convulsive status epilepticus   03-06-17: ct of soft tissue of neck: 1. Resolution of retropharyngeal fluid collection. No mass or abscess identified. 2. Patent  aerodigestive tract. Streak and motion artifact partially obscures the hypopharynx and larynx, however, there is no high-grade stricture. Mild rightward tracheal deviation. 3. Paranasal sinus mucosal thickening, fluid levels, left mastoid effusion likely due to recent intubation.  03-17-17: chest x-ray: 1.  Interim removal of lines and tubes. 2. Low lung volumes with mild basilar atelectasis, chest is otherwise unremarkable    LABS REVIEWED:  PREVIOUS   08-18-16: wbc 14.1; hgb 11.9; hct 37.3 mcv 95.4; plt 300; glucose 100; bun 28; creat  0.80; k+ 4.4; na++ 136; tsh 62.183 08-19-16: wbc 9.3; hgb 9.4; hct 30.4; mcv 96.8; plt 203; glucose 80; bun 20; creat 0.62; k+ 3.1; na++ 140; mag 1.7; phos 3.2; free T4: 0.34 08-20-16: wbc 8.2; hgb 9.5; hct 30.3; mcv 98.1; plt 225; glucose 88; bun 18; creat 0.54; k+ 3.9; na++ 139; ca 6.8; liver normal albumin 2.5; mag 1.8; phos 2.1; tsh 46.443; free T4: 0.33  08-22-16: wbc 9.3; hgb 11.7; hct 36.4; mcv 96.1; plt 281 09-01-16: tsh 63.98  TODAY:   02-22-17: wbc 10.0; hgb 11.7; hct 35.9; mcv 96.5; plt 261; glucose 127; bun 15; creat 1.68; k+ 2.7; na++ 140; ca 7.0; liver normal albumin 3.1; mag 2.1; phos 3.0; ionized ca: 4.0; trig 264; blood culture: no growth; resp culture: MRSA  02-23-17: wbc 8.9; hgb 12.2; hct 37.6; mcv 99.5 plt clump; glucose 98; bun 11; creat 1.31; k+ 3.6; na++ 142; ca 7.1; phos 1.5; mag 1.9; depakote 60; tsh 51.322 02-26-17: wbc 8.8; hgb 10.8; hct 32.4; mcv 95.6; plt 276; glucose 146; bun 12; creat 0.86; k+ 3.7; na++ 140; ca 7.8;  02-27-17: vit B 12: 484; free T 4: 0.24 03-02-17: blood culture: no growth 03-05-17: wbc 10.1; hgb 9.1; hct 26.1; mcv 98.5 plt 246; glucose 118; bun 20; creat 0.74; k+ 3.1; na++ 135; ca 8.2; phos 2.7; albumin 3.3 03-11-17: glucose 107; bun 11; creat 0.59; k+ 3.7; na++ 140; ca 8.6; phos 3.0; mag 2.2 03-15-17: wbc 15.7; hgb 9.1; hct 28.0; mcv 103.7; plt 354; glucose 98; bun 15; creat 0.59; k+ 3.7; na++ 139; ca 8.9; tsh  33.426 03-17-17: wbc 9.0; hgb 9.2; hct 29.5; mcv 104.2; plt 344; glucose 87; bun 15; creat 0.55; k+ 3.9; na++ 139; ca 8.6; liver normal albumin 3.0; chol 120; ldl 56; trig 130; hdl 38; tsh 34.621   Review of Systems  Unable to perform ROS: Other (psychiatric disorder )    Physical Exam  Constitutional: She appears well-developed and well-nourished. No distress.  Thin   Neck: Neck supple. No thyromegaly present.  Cardiovascular: Normal rate, regular rhythm, normal heart sounds and intact distal pulses.  Pulmonary/Chest: Effort normal. No respiratory distress.  Breath sounds diminished in bases   Abdominal: Soft. Bowel sounds are normal. She exhibits no distension. There is no tenderness.  Musculoskeletal: Normal range of motion. She exhibits no edema.  Lymphadenopathy:    She has no cervical adenopathy.  Neurological: She is alert.  Skin: Skin is warm and dry. She is not diaphoretic.  Psychiatric: She has a normal mood and affect.    ASSESSMENT/ PLAN:  TODAY:   1. Hypothyroidism: stable tsh 34.621 will continue synthroid 100 mcg daily and will repeat labs in 4 weeks.   2. Seizure disorder: stable will continue keppra 1 gm twice daily depakote 250 mg three times daily    3. COPD: stable: will continue advair 250/50 twice daily duoneb twice daily and albuterol inhaler 2 puffs every 4 hours as needed  4. Dyslipidemia: stable ldl 56: continue lipitor 20 mg daily   5. Chronic constipation: stable will continue senna twice daily   6.  Hypertension: stable 147/72: is currently not on medications will continue asa 81 mg daily  monitor   7. Schizoaffective disorder bipolar type: without change: will continue abilfy 20 mg daily effexor xl 300 mg daily    8. Dystonia: is stable will continue cogentin 2 mg three times daily   9. Acute respiratory failure/MRSA pneumonia: has completed abt will monitor 02 sat 93%  10.  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 She did state that we cannot keep her against her will. I did tell that she's right I cannot hold her here against her will; but I would have to call APS once she leaves the building. Her sister is presently unavailable. She did score a 7 on her BIMS test. She is not oriented enough to make this decision. She states she will call a cab and will have her sister write a check. I did go ahead write up her discharge orders and prescriptions for a 30 day supply of her medications; in cause her sister is willing to take her home this evening. This will still be an ama discharge.   MD is aware of resident's narcotic use and is in agreement with current plan of care. We will attempt to wean resident as apropriate   Ok Edwards NP [facility]  Contact [phone] Monday through Friday 8am- 5pm  After hours call [phone]

## 2017-03-22 ENCOUNTER — Emergency Department (HOSPITAL_COMMUNITY): Payer: Medicare Other

## 2017-03-22 LAB — CBC WITH DIFFERENTIAL/PLATELET
BLASTS: 0 %
Band Neutrophils: 7 %
Basophils Absolute: 0 10*3/uL (ref 0.0–0.1)
Basophils Relative: 0 %
EOS PCT: 2 %
Eosinophils Absolute: 0.2 10*3/uL (ref 0.0–0.7)
HEMATOCRIT: 31.6 % — AB (ref 36.0–46.0)
Hemoglobin: 10.1 g/dL — ABNORMAL LOW (ref 12.0–15.0)
LYMPHS ABS: 3.2 10*3/uL (ref 0.7–4.0)
Lymphocytes Relative: 32 %
MCH: 33 pg (ref 26.0–34.0)
MCHC: 32 g/dL (ref 30.0–36.0)
MCV: 103.3 fL — AB (ref 78.0–100.0)
Metamyelocytes Relative: 4 %
Monocytes Absolute: 0.6 10*3/uL (ref 0.1–1.0)
Monocytes Relative: 6 %
Myelocytes: 1 %
NEUTROS ABS: 6.1 10*3/uL (ref 1.7–7.7)
NEUTROS PCT: 48 %
NRBC: 0 /100{WBCs}
Other: 0 %
PLATELETS: 258 10*3/uL (ref 150–400)
Promyelocytes Absolute: 0 %
RBC: 3.06 MIL/uL — AB (ref 3.87–5.11)
RDW: 15.7 % — AB (ref 11.5–15.5)
WBC: 10.1 10*3/uL (ref 4.0–10.5)

## 2017-03-22 LAB — COMPREHENSIVE METABOLIC PANEL
ALK PHOS: 53 U/L (ref 38–126)
ALT: 12 U/L — AB (ref 14–54)
AST: 22 U/L (ref 15–41)
Albumin: 3.6 g/dL (ref 3.5–5.0)
Anion gap: 9 (ref 5–15)
BILIRUBIN TOTAL: 0.6 mg/dL (ref 0.3–1.2)
BUN: 12 mg/dL (ref 6–20)
CALCIUM: 8.7 mg/dL — AB (ref 8.9–10.3)
CO2: 28 mmol/L (ref 22–32)
CREATININE: 0.62 mg/dL (ref 0.44–1.00)
Chloride: 100 mmol/L — ABNORMAL LOW (ref 101–111)
GFR calc Af Amer: >60 mL/min (ref >60)
Glucose, Bld: 87 mg/dL (ref 65–99)
Potassium: 3.3 mmol/L — ABNORMAL LOW (ref 3.5–5.1)
Sodium: 137 mmol/L (ref 135–145)
Total Protein: 7 g/dL (ref 6.5–8.1)

## 2017-03-22 LAB — URINALYSIS, ROUTINE W REFLEX MICROSCOPIC
BILIRUBIN URINE: NEGATIVE
Bacteria, UA: NONE SEEN
GLUCOSE, UA: NEGATIVE mg/dL
Hgb urine dipstick: NEGATIVE
KETONES UR: 5 mg/dL — AB
NITRITE: NEGATIVE
PH: 6 (ref 5.0–8.0)
Protein, ur: NEGATIVE mg/dL
SPECIFIC GRAVITY, URINE: 1.021 (ref 1.005–1.030)

## 2017-03-22 LAB — RAPID URINE DRUG SCREEN, HOSP PERFORMED
Amphetamines: NOT DETECTED
BARBITURATES: NOT DETECTED
Benzodiazepines: NOT DETECTED
Cocaine: NOT DETECTED
Opiates: NOT DETECTED
Tetrahydrocannabinol: NOT DETECTED

## 2017-03-22 LAB — SALICYLATE LEVEL: Salicylate Lvl: <7 mg/dL (ref 2.8–30.0)

## 2017-03-22 LAB — ACETAMINOPHEN LEVEL: Acetaminophen (Tylenol), Serum: <10 ug/mL — ABNORMAL LOW (ref 10–30)

## 2017-03-22 LAB — ETHANOL

## 2017-03-22 MED ORDER — DIVALPROEX SODIUM 250 MG PO DR TAB
250.0000 mg | DELAYED_RELEASE_TABLET | Freq: Every morning | ORAL | Status: DC
  Administered 2017-03-22: 250 mg via ORAL
  Filled 2017-03-22: qty 1

## 2017-03-22 MED ORDER — ARIPIPRAZOLE 10 MG PO TABS
20.0000 mg | ORAL_TABLET | Freq: Every day | ORAL | Status: DC
  Administered 2017-03-22: 20 mg via ORAL
  Filled 2017-03-22: qty 2

## 2017-03-22 MED ORDER — BENZTROPINE MESYLATE 1 MG PO TABS
2.0000 mg | ORAL_TABLET | Freq: Three times a day (TID) | ORAL | Status: DC
  Administered 2017-03-22 (×3): 2 mg via ORAL
  Filled 2017-03-22 (×3): qty 2

## 2017-03-22 MED ORDER — POTASSIUM CHLORIDE CRYS ER 20 MEQ PO TBCR
40.0000 meq | EXTENDED_RELEASE_TABLET | Freq: Once | ORAL | Status: AC
  Administered 2017-03-22: 40 meq via ORAL
  Filled 2017-03-22: qty 2

## 2017-03-22 MED ORDER — DIVALPROEX SODIUM 500 MG PO DR TAB
500.0000 mg | DELAYED_RELEASE_TABLET | Freq: Every day | ORAL | Status: DC
  Administered 2017-03-22: 500 mg via ORAL
  Filled 2017-03-22 (×2): qty 1

## 2017-03-22 NOTE — ED Provider Notes (Signed)
  Physical Exam  BP (!) 142/83 (BP Location: Right Arm)   Pulse 85   Temp 98.8 F (37.1 C) (Oral)   Resp 19   SpO2 96%   Physical Exam  ED Course/Procedures     Procedures  MDM  TTS called. Patient has bed in a psych facility. I was requested to fill out IVC as she has no capacity. Patient appears to have auditory and visual hallucinations. Had thoughts of harming herself. She is very tangential in her speech. I filled out IVC paperwork. Stable for transfer.    Drenda Freeze, MD 03/22/17 [phone]

## 2017-03-22 NOTE — ED Provider Notes (Signed)
Patient presented to the ER with Increase aggressiveness and agitation.  Face to face Exam: HEENT - PERRLA Lungs - CTAB Heart - RRR, no M/R/G Abd - S/NT/ND Neuro - alert, Agitated, disoriented  Plan: Basic workup for medical clearance followed by psychiatric evaluation.   Orpah Greek, MD 03/22/17 [phone]

## 2017-03-22 NOTE — ED Notes (Signed)
Received a call from a female stating she was pt's sister.  She requested info on pt.  Informed d/t HIPAA unable to provide information.  Call transferred to [facility], Agricultural consultant.

## 2017-03-22 NOTE — ED Notes (Signed)
Evaleen Sant (sister): [phone]

## 2017-03-22 NOTE — ED Notes (Signed)
Pt drinking from a cup that isn't there.

## 2017-03-22 NOTE — ED Notes (Signed)
XRAY AT BEDSIDE

## 2017-03-22 NOTE — ED Notes (Addendum)
EDP [facility]AO AT BEDSIDE EVALUATING PT. IVC PAPER IN PROCESS

## 2017-03-22 NOTE — BH Assessment (Addendum)
Assessment Note  Dominique Acosta is an 66 y.o. female who presents to the ED voluntarily BIB EMS due to aggression and agitation at her current living facility. Pt appears disoriented and confused. Pt states she came to the ED because she is having "respitory failure." When asked about her hx of aggression towards staff and residents in [facility], pt denies. The pt reportedly has been observed talking to herself while in the ED and experiencing ongoing psychosis. Nurse report states the pt was observed "drinking from a cup that isn't there." Pt was asked if she is currently suicidal and pt stated "I tried to kill myself today." When asked for details regarding the incident pt continues to ramble in irrelevant topics. Per chart, pt has a hx of schizophrenia. Pt has been evaluated while in the ED multiple times c/o similar concerns. Pt does reports she is currently feeling depressed and when asked for her stressors leading to her depression pt stated "everything." Pt was inaudible at times throughout the assessment and difficult to comprehend. Pt was observed fiddling with her fingers throughout the assessment and using the motions in her fingers that would appear to be knitting something, however pt does not have anything in her hands.   Diagnosis: Schizoaffective Disorder  Past Medical History:  Past Medical History:  Diagnosis Date  . Arm pain   . Chronic pain   . COPD (chronic obstructive pulmonary disease) ([place])   . Coronary artery disease   . Depression   . Dystonia   . Epilepsy ([place])   . High cholesterol   . Humerus fracture   . Hypothyroid   . Insomnia   . Leukemia ([place])   . Myocardial infarct ([place])   . Psychosis ([place])   . Schizophrenia ([place])   . Seizure [facility])     Past Surgical History:  Procedure Laterality Date  . BALLOON DILATION N/A 03/14/2017   Procedure: BALLOON DILATION;  Surgeon: Helayne Seminole, MD;  Location: [facility];  Service: ENT;  Laterality: N/A;  .  EXTERNAL EAR SURGERY    . HUMERUS FRACTURE SURGERY    . KENALOG INJECTION N/A 03/14/2017   Procedure: KENALOG INJECTION;  Surgeon: Helayne Seminole, MD;  Location: [facility];  Service: ENT;  Laterality: N/A;  . MICROLARYNGOSCOPY WITH LASER N/A 03/14/2017   Procedure: MICROLARYNGOSCOPY WITH LASER;  Surgeon: Helayne Seminole, MD;  [facility];  Service: ENT;  Laterality: N/A;  . SHOULDER SURGERY      Family History:  Family History  Problem Relation Age of Onset  . Cancer Mother   . Cancer Father     Social History:  reports that she has quit smoking. Her smoking use included cigarettes. She has a 82.00 pack-year smoking history. she has never used smokeless tobacco. She reports that she does not drink alcohol or use drugs.  Additional Social History:  Alcohol / Drug Use Pain Medications: See MAR Prescriptions: See MAR Over the Counter: See MAR History of alcohol / drug use?: No history of alcohol / drug abuse  CIWA: CIWA-Ar BP: 130/76 Pulse Rate: 94 COWS:    Allergies:  Allergies  Allergen Reactions  . Amoxicillin Anaphylaxis and Other (See Comments)    Childhood allergy >> tolerated cephalosporins  Has patient had a PCN reaction causing immediate rash, facial/tongue/throat swelling, SOB or lightheadedness with hypotension: Yes Has patient had a PCN reaction causing severe rash involving mucus membranes or skin necrosis: No Has patient had a PCN reaction that required hospitalization  No Has patient had a PCN reaction occurring within the last 10 years: No If all of the above answers are "NO", then may proceed with Cephalosporin use.  Marland Kitchen Penicillins Anaphylaxis and Other (See Comments)    Childhood allergy Has patient had a PCN reaction causing immediate rash, facial/tongue/throat swelling, SOB or lightheadedness with hypotension: Yes Has patient had a PCN reaction causing severe rash involving mucus membranes or skin necrosis: No Has patient had a PCN reaction that  required hospitalization No Has patient had a PCN reaction occurring within the last 10 years: No If all of the above answers are "NO", then may proceed with Cephalosporin use.   Marland Kitchen Phenytoin Other (See Comments)    Reaction:  CNS disorder   . Keflex [Cephalexin] Diarrhea  . Dilaudid [Hydromorphone Hcl] Other (See Comments)    Reaction:  Psychosis   . Haldol [Haloperidol] Other (See Comments)    Reaction:  Psychosis   . Morphine And Related Other (See Comments)    Reaction:  Psychosis     Home Medications:  (Not in a hospital admission)  OB/GYN Status:  No LMP recorded. Patient is postmenopausal.  General Assessment Data Location of Assessment: [facility] TTS Assessment: In system Is this a Tele or Face-to-Face Assessment?: Face-to-Face Is this an Initial Assessment or a Re-assessment for this encounter?: Initial Assessment Marital status: Single Is patient pregnant?: No Pregnancy Status: No Living Arrangements: Other (Comment)([facility]) Can pt return to current living arrangement?: Yes Admission Status: Voluntary Is patient capable of signing voluntary admission?: Yes Referral Source: Other([facility]) Insurance type: [facility] MCR      Crisis Care Plan Living Arrangements: Other (Comment)([facility]) Name of Psychiatrist: pt denies Name of Therapist: pt denies  Education Status Is patient currently in school?: No Highest grade of school patient has completed: some college  Contact person: self  Risk to self with the past 6 months Suicidal Ideation: Yes-Currently Present Has patient been a risk to self within the past 6 months prior to admission? : No Suicidal Intent: No Has patient had any suicidal intent within the past 6 months prior to admission? : No Is patient at risk for suicide?: Yes Suicidal Plan?: No Has patient had any suicidal plan within the past 6 months prior to admission? : No Access to Means: No What has been your  use of drugs/alcohol within the last 12 months?: denies  Previous Attempts/Gestures: No Triggers for Past Attempts: None known Intentional Self Injurious Behavior: None Family Suicide History: No Recent stressful life event(s): Other (Comment)([facility]) Persecutory voices/beliefs?: No Depression: Yes Depression Symptoms: Tearfulness, Despondent Substance abuse history and/or treatment for substance abuse?: No Suicide prevention information given to non-admitted patients: Not applicable  Risk to Others within the past 6 months Homicidal Ideation: No Does patient have any lifetime risk of violence toward others beyond the six months prior to admission? : Yes (comment)(per chart, pt has been aggressive in living facility ) Thoughts of Harm to Others: No-Not Currently Present/Within Last 6 Months Current Homicidal Intent: No Current Homicidal Plan: No Access to Homicidal Means: No History of harm to others?: Yes Assessment of Violence: On admission Violent Behavior Description: per chart, pt has been aggressive in the nursing facility  Does patient have access to weapons?: No Criminal Charges Pending?: No Does patient have a court date: No Is patient on probation?: No  Psychosis Hallucinations: Auditory, Visual Delusions: Unspecified  Mental Status Report Appearance/Hygiene: Disheveled, In hospital gown Eye Contact: Good Motor Activity:  Tremors Speech: Incoherent, Soft, Slurred Level of Consciousness: Quiet/awake Mood: Labile Affect: Inconsistent with thought content Anxiety Level: Minimal Thought Processes: Irrelevant, Flight of Ideas Judgement: Impaired Orientation: Not oriented Obsessive Compulsive Thoughts/Behaviors: Moderate  Cognitive Functioning Concentration: Poor Memory: Recent Impaired, Remote Impaired IQ: Average Insight: Poor Impulse Control: Poor Appetite: Good Sleep: No Change Total Hours of Sleep: 7 Vegetative Symptoms: None  ADLScreening [facility] Assessment  Services) Patient's cognitive ability adequate to safely complete daily activities?: No Patient able to express need for assistance with ADLs?: Yes Independently performs ADLs?: No  Prior Inpatient Therapy Prior Inpatient Therapy: Yes Prior Therapy Dates: 2018 and mult other admissions  Prior Therapy Facilty/Provider(s): [place], [facility]  Reason for Treatment: Schizoaffective d/o  Prior Outpatient Therapy Prior Outpatient Therapy: Yes Prior Therapy Dates: unknown Prior Therapy Facilty/Provider(s): unknown Reason for Treatment: Schizoaffective d/o Does patient have an ACCT team?: No Does patient have Intensive In-House Services?  : No Does patient have [facility] services? : No Does patient have P4CC services?: No  ADL Screening (condition at time of admission) Patient's cognitive ability adequate to safely complete daily activities?: No Is the patient deaf or have difficulty hearing?: Yes Does the patient have difficulty seeing, even when wearing glasses/contacts?: Yes Does the patient have difficulty concentrating, remembering, or making decisions?: Yes Patient able to express need for assistance with ADLs?: Yes Does the patient have difficulty dressing or bathing?: Yes Independently performs ADLs?: No Communication: Independent Dressing (OT): Needs assistance Is this a change from baseline?: Pre-admission baseline Grooming: Needs assistance Is this a change from baseline?: Pre-admission baseline Feeding: Independent Bathing: Needs assistance Is this a change from baseline?: Pre-admission baseline Toileting: Needs assistance Is this a change from baseline?: Pre-admission baseline In/Out Bed: Needs assistance Is this a change from baseline?: Pre-admission baseline Walks in Home: Needs assistance Is this a change from baseline?: Pre-admission baseline Does the patient have difficulty walking or climbing stairs?: Yes Weakness of Legs: Both Weakness of Arms/Hands: Both  Home  Assistive Devices/Equipment Home Assistive Devices/Equipment: Wheelchair    Abuse/Neglect Assessment (Assessment to be complete while patient is alone) Abuse/Neglect Assessment Can Be Completed: Yes Physical Abuse: Denies Verbal Abuse: Denies Sexual Abuse: Denies Exploitation of patient/patient's resources: Denies Self-Neglect: Denies     Regulatory affairs officer (For Healthcare) Does Patient Have a Medical Advance Directive?: No(pt states she is her own POA) Does patient want to make changes to medical advance directive?: No - Patient declined    Additional Information 1:1 In Past 12 Months?: No CIRT Risk: Yes Elopement Risk: No Does patient have medical clearance?: Yes     Disposition: Case discussed with Dr. Parke Poisson, MD who recommends gero-psych inpt treatment. EDP [name], MD contacted and made aware of disposition. TTS to seek placement. Pt's nurse Margaretha Sheffield, RN has been advised of disposition.   Disposition Initial Assessment Completed for this Encounter: Yes Disposition of Patient: Inpatient treatment program Type of inpatient treatment program: Adult(gero-psych per Dr. Parke Poisson, MD)  On Site Evaluation by:   Reviewed with Physician:    Lyanne Co 03/22/2017 5:47 AM

## 2017-03-22 NOTE — BH Assessment (Signed)
[facility] Assessment Progress Note  Per Buford Dresser, DO, this pt requires psychiatric hospitalization at this time.  The following facilities have been contacted to seek placement for this pt, with results as noted:  Beds available, information sent, decision pending:  [facility]   At capacity:  [facility] [place], [place] Triage Specialist [phone]

## 2017-03-22 NOTE — ED Notes (Addendum)
Pt OOB, CNA in to room, pt leaned against wall and slid down to floor, requested assistance to stand up & began stating "I need to call [name], they told me they would give me the money to call [name]."  Questioned who was [name], pt stated "It's my mother."  Bed alarm remains on.

## 2017-03-22 NOTE — ED Notes (Addendum)
Pt sent to TCU, remains in clothes from NH.  Pt refused to change into paper scrubs.  Pt is verbally abusive.

## 2017-03-22 NOTE — BH Assessment (Signed)
[facility] Assessment Progress Note  At 16:29 Valetta Fuller calls from [facility].  Pt has been accepted to their facility by Dr Bjorn Loser to the [address] at the [facility].  Pt will need pt be placed under IVC.  Ethelene Hal, FNP concurs with this decision.  Clemencia Course, LPC, LCAS agrees to facilitate IVC with the EDP.  Please fax IVC documents to [facility] at [phone] when they are completed and served.  Call report to [phone], and also call this number to inform them whether pt will be transported tonight or tomorrow morning.  Pt will be transported via [facility].  Pt's nurse, Caryl Pina, has been notified.  Jalene Mullet, [place] Coordinator [phone]

## 2017-03-22 NOTE — ED Notes (Signed)
Pt's sister called again requesting a call with plan.  Informed # was placed in chart.

## 2017-03-22 NOTE — ED Provider Notes (Signed)
[facility] DEPT Provider Note   CSN: [id number] Arrival date & time: 03/21/17  2158     History   Chief Complaint No chief complaint on file.   HPI Dominique Acosta is a 66 y.o. female with a history of depression, COPD, psychosis, dystonia, and schizophrenia presenting to the emergency department from [facility] for worsening aggressiveness with agitation. Staff reports they have attempted to redirect the patient, which has been unsuccessful. She has been treated with PO Ativan without improvement. She was brought to the ED for psychiatric evaluation. She denies SI and HI.  The patient has been observed in her hallways bed talking to herself for prolonged periods of time. She has recited some of her home medication and doses when asked if anything hurts today. She attempts to tell a riddle about an end table and a portrait without a punch line. No other complaints at this time.  No new falls or injuries.  Level 5 caveat secondary to AMS.   The history is provided by the patient. No language interpreter was used.    Past Medical History:  Diagnosis Date  . Arm pain   . Chronic pain   . COPD (chronic obstructive pulmonary disease) ([place])   . Coronary artery disease   . Depression   . Dystonia   . Epilepsy ([place])   . High cholesterol   . Humerus fracture   . Hypothyroid   . Insomnia   . Leukemia ([place])   . Myocardial infarct ([place])   . Psychosis ([place])   . Schizophrenia ([place])   . Seizure [facility])     Patient Active Problem List   Diagnosis Date Noted  . Hypothyroidism due to acquired atrophy of thyroid 03/21/2017  . Dystonia 03/21/2017  . Dyslipidemia 03/21/2017  . COPD (chronic obstructive pulmonary disease) ([place]) 03/21/2017  . 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 MRSA pneumonia ([place]) 03/11/2017  . Acute respiratory failure with hypoxia ([place])   . Acute encephalopathy 02/22/2017  . Gastroesophageal reflux disease without esophagitis  09/11/2016  . Insomnia 08/26/2016  . Seizure disorder ([place]) 08/26/2016  . Humerus fracture 08/18/2016  . Acute metabolic encephalopathy 98/92/1194  . Acute lower UTI 08/03/2016  . Hypokalemia 08/03/2016  . Leukocytosis 08/03/2016  . Benign essential HTN 08/03/2016  . Schizoaffective disorder, bipolar type ([place]) 07/08/2016  . Chronic constipation 09/02/2010    Past Surgical History:  Procedure Laterality Date  . BALLOON DILATION N/A 03/14/2017   Procedure: BALLOON DILATION;  Surgeon: Helayne Seminole, MD;  Location: [facility];  Service: ENT;  Laterality: N/A;  . EXTERNAL EAR SURGERY    . HUMERUS FRACTURE SURGERY    . KENALOG INJECTION N/A 03/14/2017   Procedure: KENALOG INJECTION;  Surgeon: Helayne Seminole, MD;  Location: [facility];  Service: ENT;  Laterality: N/A;  . MICROLARYNGOSCOPY WITH LASER N/A 03/14/2017   Procedure: MICROLARYNGOSCOPY WITH LASER;  Surgeon: Helayne Seminole, MD;  Location: [facility];  Service: ENT;  Laterality: N/A;  . SHOULDER SURGERY      OB History    No data available       Home Medications    Prior to Admission medications   Medication Sig Start Date End Date Taking? Authorizing Provider  ADVAIR DISKUS 250-50 MCG/DOSE AEPB Take 1 puff by mouth daily. 01/12/17  Yes [provider]  ARIPiprazole (ABILIFY) 20 MG tablet Place 1 tablet (20 mg total) into feeding tube daily. 03/18/17  Yes Sheikh, Omair Latif, DO  aspirin EC 81 MG  tablet Take 81 mg by mouth daily.   Yes [provider]  atorvastatin (LIPITOR) 20 MG tablet Take 20 mg by mouth at bedtime. Take one tablet daily for hyperlipidemia    Yes [provider]  baclofen (LIORESAL) 10 MG tablet Take 10 mg by mouth 3 (three) times daily as needed for muscle spasms.   Yes [provider]  benztropine (COGENTIN) 2 MG tablet Take 1 tablet (2 mg total) by mouth 3 (three) times daily. 01/26/17  Yes [name], MD  divalproex (DEPAKOTE) 250 MG DR tablet Take 1 tablet  (250 mg total) by mouth every 8 (eight) hours. 03/17/17  Yes Sheikh, Omair Latif, DO  folic acid (FOLVITE) 1 MG tablet Place 1 tablet (1 mg total) into feeding tube daily. 03/18/17  Yes Sheikh, Omair Latif, DO  ipratropium-albuterol (DUONEB) 0.5-2.5 (3) MG/3ML SOLN Take 3 mLs by nebulization 2 (two) times daily. 03/17/17  Yes Sheikh, Omair Latif, DO  levETIRAcetam (KEPPRA) 1000 MG tablet Take 1 tablet (1,000 mg total) by mouth 2 (two) times daily. 03/17/17  Yes Sheikh, Omair Latif, DO  levothyroxine (SYNTHROID, LEVOTHROID) 100 MCG tablet Take 1 tablet (100 mcg total) by mouth daily before breakfast. 03/18/17  Yes Sheikh, Omair Latif, DO  Nutritional Supplements (NUTRITIONAL SUPPLEMENT PO) Take by mouth. House 2.0 - Give by mouth two times daily for supplement   Yes [provider]  ondansetron (ZOFRAN) 8 MG tablet Take 1 tablet (8 mg total) by mouth every 8 (eight) hours as needed for nausea or vomiting. 01/26/17  Yes [name], MD  PROAIR HFA [phone] Base) MCG/ACT inhaler Inhale 1 puff into the lungs every 4 (four) hours as needed for shortness of breath.  01/31/17  Yes [provider]  senna (SENOKOT) 8.6 MG TABS tablet Take 1 tablet by mouth 2 (two) times daily. 03/18/17  Yes [provider]  thiamine 100 MG tablet Take 100 mg by mouth daily.   Yes [provider]  venlafaxine XR (EFFEXOR-XR) 150 MG 24 hr capsule Take 150 mg by mouth daily with breakfast.    Yes [provider]    Family History Family History  Problem Relation Age of Onset  . Cancer Mother   . Cancer Father     Social History Social History   Tobacco Use  . Smoking status: Former Smoker    Packs/day: 2.00    Years: 41.00    Pack years: 82.00    Types: Cigarettes  . Smokeless tobacco: Never Used  Substance Use Topics  . Alcohol use: No  . Drug use: No     Allergies   Amoxicillin; Penicillins; Phenytoin; Keflex [cephalexin]; Dilaudid [hydromorphone hcl]; Haldol  [haloperidol]; and Morphine and related   Review of Systems Review of Systems  Unable to perform ROS: Mental status change  Psychiatric/Behavioral: Positive for confusion.     Physical Exam Updated Vital Signs BP 130/76   Pulse 94   Temp 98.3 F (36.8 C) (Oral)   Resp 20   SpO2 95%   Physical Exam  Constitutional: No distress.  Obese elderly female.   HENT:  Head: Normocephalic.  Eyes: Conjunctivae are normal.  Neck: Normal range of motion. Neck supple.  Cardiovascular: Normal rate, regular rhythm, normal heart sounds and intact distal pulses. Exam reveals no gallop and no friction rub.  No murmur heard. Pulmonary/Chest: Effort normal. No stridor. No respiratory distress. She has no wheezes. She has no rales.  Abdominal: Soft. Bowel sounds are normal. She exhibits no  distension and no mass. There is no tenderness. There is no rebound and no guarding. No hernia.  Musculoskeletal: Normal range of motion. She exhibits no edema, tenderness or deformity.  Neurological: She is alert. She has normal strength. No sensory deficit.  Oriented to self. Does not answer questions appropriately. Does not follow simple commands.   Skin: Skin is warm. No rash noted.  Psychiatric: Her behavior is normal. Her affect is inappropriate. Her speech is tangential. She expresses impulsivity. She expresses no homicidal and no suicidal ideation.  Observed to being holding entire conversations with herself in the hallway. Appears more agitated when asked additional questions.   Nursing note and vitals reviewed.  ED Treatments / Results  Labs (all labs ordered are listed, but only abnormal results are displayed) Labs Reviewed  COMPREHENSIVE METABOLIC PANEL - Abnormal; Notable for the following components:      Result Value   Potassium 3.3 (*)    Chloride 100 (*)    Calcium 8.7 (*)    ALT 12 (*)    All other components within normal limits  CBC WITH DIFFERENTIAL/PLATELET - Abnormal; Notable for  the following components:   RBC 3.06 (*)    Hemoglobin 10.1 (*)    HCT 31.6 (*)    MCV 103.3 (*)    RDW 15.7 (*)    All other components within normal limits  ACETAMINOPHEN LEVEL - Abnormal; Notable for the following components:   Acetaminophen (Tylenol), Serum <10 (*)    All other components within normal limits  ETHANOL  RAPID URINE DRUG SCREEN, HOSP PERFORMED  SALICYLATE LEVEL    EKG  EKG Interpretation None       Radiology No results found.  Procedures Procedures (including critical care time)  Medications Ordered in ED Medications - No data to display   Initial Impression / Assessment and Plan / ED Course  I have reviewed the triage vital signs and the nursing notes.  Pertinent labs & imaging results that were available during my care of the patient were reviewed by me and considered in my medical decision making (see chart for details).     66 year old female with a history of psychosis, schizophrenia, depression, dystonia, and COPD presenting from [facility] for worsening aggression and agitation. Patient noted to be conversing with herself in the hallway. Denies SI or HI at this time.  Became agitated during history and physical exam with additional questions. ED medical clearance labs ordered and are pending. TTS consult pending; please see psych team notes for further documentation of care/dispo. If the patient has medical causes of her symptoms will treat as needed. If not, will defer to TTS recommendation.  Patient care transferred to [facility] at the end of my shift. Patient presentation, ED course, and plan of care discussed with review of all pertinent labs and imaging. Please see his/her note for further details regarding further ED course and disposition.  Final Clinical Impressions(s) / ED Diagnoses   Final diagnoses:  None    ED Discharge Orders    None       Joanne Gavel, PA-C 03/22/17 0146    Orpah Greek,  MD 03/22/17 [phone]

## 2017-03-22 NOTE — ED Notes (Signed)
Bed: WA27 Expected date:  Expected time:  Means of arrival:  Comments: 

## 2017-03-22 NOTE — ED Provider Notes (Signed)
Labs unremarkable.  Contaminated urine.  VSS.  TTS evaluation pending.   Montine Circle, PA-C 03/22/17 0308    Orpah Greek, MD 03/22/17 [phone]

## 2017-03-22 NOTE — BHH Counselor (Signed)
Faxed over completed IVC paperwork to [facility] and let RN at [facility] know that pt would be on the morning transport. RN in TCU notified that IVC paperwork has been faxed as well.  [address] [place], LCAS

## 2017-03-23 ENCOUNTER — Encounter: Payer: Self-pay | Admitting: Internal Medicine

## 2017-03-23 NOTE — ED Notes (Signed)
Pt refused to eat or drink informed the nurse

## 2017-03-23 NOTE — ED Notes (Signed)
Called [employer] [phone], left message requesting pt transport to [facility], [place], [place].  Call back # provided for further information.

## 2017-03-23 NOTE — ED Notes (Signed)
Report called to Armed forces training and education officer

## 2017-03-23 NOTE — Progress Notes (Signed)
Patient ID: Dominique Acosta, female   DOB: 1951-03-16, 66 y.o.   MRN: [id number]  Provider:  DR Arletha Grippe Location:  [facility] Room Number: [place] of Service:  SNF (31)  PCP: Lorene Dy, MD Patient Care Team: Lorene Dy, MD as PCP - General (Internal Medicine)  Extended Emergency Contact Information Primary Emergency Contact: Malen Gauze [place] of [place] Phone: [phone] Relation: Sister Secondary Emergency Contact: Aundra Millet [place] of [place] Phone: [phone] Relation: Other  Code Status:  Goals of Care: Advanced Directive information Advanced Directives 03/22/2017  Does Patient Have a Medical Advance Directive? No  Type of Advance Directive -  Does patient want to make changes to medical advance directive? No - Patient declined  Copy of [place] in Chart? -  Would patient like information on creating a medical advance directive? -  Pre-existing out of facility DNR order (yellow form or pink MOST form) -      Chief Complaint  Patient presents with  . New Admit To SNF    New Admit to [facility]    HPI: Patient is a 66 y.o. female seen today for admission to  Past Medical History:  Diagnosis Date  . Arm pain   . Chronic pain   . COPD (chronic obstructive pulmonary disease) ([place])   . Coronary artery disease   . Depression   . Dystonia   . Epilepsy ([place])   . High cholesterol   . Humerus fracture   . Hypothyroid   . Insomnia   . Leukemia ([place])   . Myocardial infarct ([place])   . Psychosis ([place])   . Schizophrenia ([place])   . Seizure [facility])    Past Surgical History:  Procedure Laterality Date  . BALLOON DILATION N/A 03/14/2017   Procedure: BALLOON DILATION;  Surgeon: Helayne Seminole, MD;  Location: [place];  Service: ENT;  Laterality: N/A;  . EXTERNAL EAR SURGERY    . HUMERUS FRACTURE SURGERY    . KENALOG INJECTION N/A 03/14/2017   Procedure: KENALOG  INJECTION;  Surgeon: Helayne Seminole, MD;  Location: [place];  Service: ENT;  Laterality: N/A;  . MICROLARYNGOSCOPY WITH LASER N/A 03/14/2017   Procedure: MICROLARYNGOSCOPY WITH LASER;  Surgeon: Helayne Seminole, MD;  Location: [place];  Service: ENT;  Laterality: N/A;  . SHOULDER SURGERY      reports that she has quit smoking. Her smoking use included cigarettes. She has a 82.00 pack-year smoking history. she has never used smokeless tobacco. She reports that she does not drink alcohol or use drugs. Social History   Socioeconomic History  . Marital status: Single    Spouse name: Not on file  . Number of children: Not on file  . Years of education: Not on file  . Highest education level: Not on file  Social Needs  . Financial resource strain: Not on file  . Food insecurity - worry: Not on file  . Food insecurity - inability: Not on file  . Transportation needs - medical: Not on file  . Transportation needs - non-medical: Not on file  Occupational History  . Not on file  Tobacco Use  . Smoking status: Former Smoker    Packs/day: 2.00    Years: 41.00    Pack years: 82.00    Types: Cigarettes  . Smokeless tobacco: Never Used  Substance and Sexual Activity  . Alcohol use: No  . Drug use: No  . Sexual  activity: No  Other Topics Concern  . Not on file  Social History Narrative   Admitted to [facility] daily   Alcohol none   No Advanced Directive.       Functional Status Survey:    Family History  Problem Relation Age of Onset  . Cancer Mother   . Cancer Father     Health Maintenance  Topic Date Due  . INFLUENZA VACCINE  04/21/2017 (Originally 11/09/2016)  . MAMMOGRAM  08/26/2017 (Originally 06/16/1999)  . DEXA SCAN  08/26/2017 (Originally 06/16/2014)  . Hepatitis C Screening  08/26/2017 (Originally January 11, 1951)  . PNA vac Low Risk Adult (1 of 2 - PCV13) 08/26/2017 (Originally 06/16/2014)  . COLONOSCOPY  01/25/2021  . TETANUS/TDAP   08/25/2026    Allergies  Allergen Reactions  . Amoxicillin Anaphylaxis and Other (See Comments)    Childhood allergy >> tolerated cephalosporins  Has patient had a PCN reaction causing immediate rash, facial/tongue/throat swelling, SOB or lightheadedness with hypotension: Yes Has patient had a PCN reaction causing severe rash involving mucus membranes or skin necrosis: No Has patient had a PCN reaction that required hospitalization No Has patient had a PCN reaction occurring within the last 10 years: No If all of the above answers are "NO", then may proceed with Cephalosporin use.  Marland Kitchen Penicillins Anaphylaxis and Other (See Comments)    Childhood allergy Has patient had a PCN reaction causing immediate rash, facial/tongue/throat swelling, SOB or lightheadedness with hypotension: Yes Has patient had a PCN reaction causing severe rash involving mucus membranes or skin necrosis: No Has patient had a PCN reaction that required hospitalization No Has patient had a PCN reaction occurring within the last 10 years: No If all of the above answers are "NO", then may proceed with Cephalosporin use.   Marland Kitchen Phenytoin Other (See Comments)    Reaction:  CNS disorder   . Keflex [Cephalexin] Diarrhea  . Dilaudid [Hydromorphone Hcl] Other (See Comments)    Reaction:  Psychosis   . Haldol [Haloperidol] Other (See Comments)    Reaction:  Psychosis   . Morphine And Related Other (See Comments)    Reaction:  Psychosis     Outpatient Encounter Medications as of 03/23/2017  Medication Sig  . ADVAIR DISKUS 250-50 MCG/DOSE AEPB Take 1 puff by mouth daily.  . ARIPiprazole (ABILIFY) 20 MG tablet Place 1 tablet (20 mg total) into feeding tube daily.  Marland Kitchen aspirin EC 81 MG tablet Take 81 mg by mouth daily.  Marland Kitchen atorvastatin (LIPITOR) 20 MG tablet Take 20 mg by mouth at bedtime. Take one tablet daily for hyperlipidemia   . baclofen (LIORESAL) 10 MG tablet Take 10 mg by mouth 3 (three) times daily as needed for muscle  spasms.  . benztropine (COGENTIN) 2 MG tablet Take 1 tablet (2 mg total) by mouth 3 (three) times daily.  . divalproex (DEPAKOTE) 250 MG DR tablet Take 1 tablet (250 mg total) by mouth every 8 (eight) hours.  . folic acid (FOLVITE) 1 MG tablet Place 1 tablet (1 mg total) into feeding tube daily.  Marland Kitchen ipratropium-albuterol (DUONEB) 0.5-2.5 (3) MG/3ML SOLN Take 3 mLs by nebulization 2 (two) times daily.  Marland Kitchen levETIRAcetam (KEPPRA) 1000 MG tablet Take 1 tablet (1,000 mg total) by mouth 2 (two) times daily.  Marland Kitchen levothyroxine (SYNTHROID, LEVOTHROID) 100 MCG tablet Take 1 tablet (100 mcg total) by mouth daily before breakfast.  . Nutritional Supplements (NUTRITIONAL SUPPLEMENT PO) Take by mouth. House 2.0 -  Give by mouth two times daily for supplement  . ondansetron (ZOFRAN) 8 MG tablet Take 1 tablet (8 mg total) by mouth every 8 (eight) hours as needed for nausea or vomiting.  Marland Kitchen PROAIR HFA 108 (90 Base) MCG/ACT inhaler Inhale 1 puff into the lungs every 4 (four) hours as needed for shortness of breath.   . senna (SENOKOT) 8.6 MG TABS tablet Take 1 tablet by mouth 2 (two) times daily.  Marland Kitchen thiamine 100 MG tablet Take 100 mg by mouth daily.  Marland Kitchen venlafaxine XR (EFFEXOR-XR) 150 MG 24 hr capsule Take 150 mg by mouth daily with breakfast.    Facility-Administered Encounter Medications as of 03/23/2017  Medication  . ARIPiprazole (ABILIFY) tablet 20 mg  . benztropine (COGENTIN) tablet 2 mg  . divalproex (DEPAKOTE) DR tablet 250 mg  . divalproex (DEPAKOTE) DR tablet 500 mg    Review of Systems  Vitals:   03/23/17 1043  BP: 140/70  Pulse: 88  Resp: 18  Temp: 98 F (36.7 C)  TempSrc: Oral  Weight: 180 lb 8 oz (81.9 kg)  Height: 5' (1.524 m)   Body mass index is 35.25 kg/m. Physical Exam  Labs reviewed: Basic Metabolic Panel: Recent Labs    03/15/17 0258 03/16/17 0309 03/17/17 0310 03/22/17 0007  NA 139 140 139 137  K 3.7 2.8* 3.9 3.3*  CL 95* 96* 101 100*  CO2 34* 33* 32 28  GLUCOSE 98 84  87 87  BUN 15 15 15 12   CREATININE 0.59 0.59 0.55 0.62  CALCIUM 8.9 8.3* 8.6* 8.7*  MG 2.1 2.0 2.1  --   PHOS 3.1 3.3 3.1  --    Liver Function Tests: Recent Labs    03/16/17 0309 03/17/17 0310 03/22/17 0007  AST 17 19 22   ALT 8* 10* 12*  ALKPHOS 51 53 53  BILITOT 0.7 0.5 0.6  PROT 6.0* 6.4* 7.0  ALBUMIN 2.9* 3.0* 3.6   Recent Labs    01/26/17 1229  LIPASE 25   No results for input(s): AMMONIA in the last 8760 hours. CBC: Recent Labs    03/16/17 0309 03/17/17 0310 03/22/17 0007  WBC 10.7* 9.0 10.1  NEUTROABS 6.8 5.1 6.1  HGB 8.5* 9.2* 10.1*  HCT 26.8* 29.5* 31.6*  MCV 105.1* 104.2* 103.3*  PLT 388 344 258   Cardiac Enzymes: Recent Labs    08/19/16 0505 02/22/17 1308 02/22/17 2029 02/22/17 2356  CKTOTAL  --  174  --   --   TROPONINI <0.03  --  <0.03 <0.03   BNP: Invalid input(s): POCBNP No results found for: HGBA1C Lab Results  Component Value Date   TSH 34.621 (H) 03/17/2017   Lab Results  Component Value Date   PNTIRWER15 400 02/27/2017   No results found for: FOLATE No results found for: IRON, TIBC, FERRITIN  Imaging and Procedures obtained prior to SNF admission: Dg Chest 1 View  Result Date: 03/22/2017 CLINICAL DATA:  Psych evaluation EXAM: CHEST 1 VIEW COMPARISON:  03/17/2017 FINDINGS: Cardiac shadow is stable. Postsurgical changes in the right shoulder are noted. Lungs are well aerated without focal infiltrate. Chronic changes in the proximal left humerus are noted. IMPRESSION: No acute abnormality noted. Electronically Signed   By: Inez Catalina M.D.   On: 03/22/2017 13:55    Assessment/Plan    Family/ staff Communication:   Labs/tests ordered:    Cordella Register. Perlie Gold  [facility] [place], [place]  [address] [phone] Cell (Monday-Friday 8 AM - 5 PM) [phone] After 5 PM and follow prompts This encounter was created in error - please disregard.

## 2017-03-23 NOTE — ED Notes (Signed)
[address], [place], [place], [place] & informed pt will be going to [address].  Call report to [phone].

## 2017-04-07 ENCOUNTER — Other Ambulatory Visit: Payer: Self-pay | Admitting: Adult Health

## 2017-07-14 ENCOUNTER — Emergency Department (HOSPITAL_COMMUNITY): Payer: Medicare Other

## 2017-07-14 ENCOUNTER — Emergency Department (HOSPITAL_COMMUNITY)
Admission: EM | Admit: 2017-07-14 | Discharge: 2017-07-19 | Disposition: A | Discharge status: Home / Self Care | Payer: Medicare Other | Attending: Emergency Medicine | Admitting: Emergency Medicine

## 2017-07-14 ENCOUNTER — Other Ambulatory Visit: Payer: Self-pay

## 2017-07-14 ENCOUNTER — Encounter (HOSPITAL_COMMUNITY): Payer: Self-pay | Admitting: Emergency Medicine

## 2017-07-14 DIAGNOSIS — R471 Dysarthria and anarthria: Secondary | ICD-10-CM | POA: Diagnosis not present

## 2017-07-14 DIAGNOSIS — Z9114 Patient's other noncompliance with medication regimen: Secondary | ICD-10-CM | POA: Diagnosis not present

## 2017-07-14 DIAGNOSIS — Z79899 Other long term (current) drug therapy: Secondary | ICD-10-CM | POA: Diagnosis not present

## 2017-07-14 DIAGNOSIS — F209 Schizophrenia, unspecified: Secondary | ICD-10-CM | POA: Insufficient documentation

## 2017-07-14 DIAGNOSIS — F250 Schizoaffective disorder, bipolar type: Secondary | ICD-10-CM | POA: Diagnosis present

## 2017-07-14 DIAGNOSIS — Z7982 Long term (current) use of aspirin: Secondary | ICD-10-CM | POA: Insufficient documentation

## 2017-07-14 DIAGNOSIS — R451 Restlessness and agitation: Secondary | ICD-10-CM | POA: Diagnosis not present

## 2017-07-14 DIAGNOSIS — Z87891 Personal history of nicotine dependence: Secondary | ICD-10-CM | POA: Insufficient documentation

## 2017-07-14 DIAGNOSIS — I2510 Atherosclerotic heart disease of native coronary artery without angina pectoris: Secondary | ICD-10-CM | POA: Diagnosis not present

## 2017-07-14 DIAGNOSIS — R413 Other amnesia: Secondary | ICD-10-CM | POA: Diagnosis not present

## 2017-07-14 DIAGNOSIS — E039 Hypothyroidism, unspecified: Secondary | ICD-10-CM | POA: Insufficient documentation

## 2017-07-14 DIAGNOSIS — R4587 Impulsiveness: Secondary | ICD-10-CM | POA: Diagnosis not present

## 2017-07-14 DIAGNOSIS — J449 Chronic obstructive pulmonary disease, unspecified: Secondary | ICD-10-CM | POA: Diagnosis not present

## 2017-07-14 DIAGNOSIS — Z046 Encounter for general psychiatric examination, requested by authority: Secondary | ICD-10-CM | POA: Diagnosis not present

## 2017-07-14 DIAGNOSIS — I10 Essential (primary) hypertension: Secondary | ICD-10-CM | POA: Insufficient documentation

## 2017-07-14 DIAGNOSIS — R4182 Altered mental status, unspecified: Secondary | ICD-10-CM | POA: Diagnosis present

## 2017-07-14 LAB — COMPREHENSIVE METABOLIC PANEL
ALBUMIN: 4.4 g/dL (ref 3.5–5.0)
ALT: 42 U/L (ref 14–54)
AST: 36 U/L (ref 15–41)
Alkaline Phosphatase: 83 U/L (ref 38–126)
Anion gap: 14 (ref 5–15)
BILIRUBIN TOTAL: 0.8 mg/dL (ref 0.3–1.2)
BUN: 15 mg/dL (ref 6–20)
CHLORIDE: 102 mmol/L (ref 101–111)
CO2: 24 mmol/L (ref 22–32)
Calcium: 9.5 mg/dL (ref 8.9–10.3)
Creatinine, Ser: 0.83 mg/dL (ref 0.44–1.00)
GFR calc Af Amer: >60 mL/min (ref >60)
GFR calc non Af Amer: >60 mL/min (ref >60)
GLUCOSE: 91 mg/dL (ref 65–99)
POTASSIUM: 4 mmol/L (ref 3.5–5.1)
SODIUM: 140 mmol/L (ref 135–145)
Total Protein: 8.4 g/dL — ABNORMAL HIGH (ref 6.5–8.1)

## 2017-07-14 LAB — ACETAMINOPHEN LEVEL: Acetaminophen (Tylenol), Serum: <10 ug/mL — ABNORMAL LOW (ref 10–30)

## 2017-07-14 LAB — BRAIN NATRIURETIC PEPTIDE: B NATRIURETIC PEPTIDE 5: 12.1 pg/mL (ref 0.0–100.0)

## 2017-07-14 LAB — CBC
HEMATOCRIT: 37.6 % (ref 36.0–46.0)
HEMOGLOBIN: 12.1 g/dL (ref 12.0–15.0)
MCH: 30.2 pg (ref 26.0–34.0)
MCHC: 32.2 g/dL (ref 30.0–36.0)
MCV: 93.8 fL (ref 78.0–100.0)
Platelets: 286 10*3/uL (ref 150–400)
RBC: 4.01 MIL/uL (ref 3.87–5.11)
RDW: 14.6 % (ref 11.5–15.5)
WBC: 10.4 10*3/uL (ref 4.0–10.5)

## 2017-07-14 LAB — TSH: TSH: 39.617 u[IU]/mL — ABNORMAL HIGH (ref 0.350–4.500)

## 2017-07-14 LAB — VALPROIC ACID LEVEL: Valproic Acid Lvl: <10 ug/mL — ABNORMAL LOW (ref 50.0–100.0)

## 2017-07-14 LAB — I-STAT CG4 LACTIC ACID, ED: Lactic Acid, Venous: 1.42 mmol/L (ref 0.5–1.9)

## 2017-07-14 LAB — PROCALCITONIN

## 2017-07-14 LAB — ETHANOL: Alcohol, Ethyl (B): <10 mg/dL (ref <10)

## 2017-07-14 LAB — T4, FREE: FREE T4: 0.61 ng/dL (ref 0.61–1.12)

## 2017-07-14 LAB — AMMONIA: Ammonia: 31 umol/L (ref 9–35)

## 2017-07-14 LAB — SALICYLATE LEVEL: Salicylate Lvl: <7 mg/dL (ref 2.8–30.0)

## 2017-07-14 LAB — I-STAT TROPONIN, ED: Troponin i, poc: 0 ng/mL (ref 0.00–0.08)

## 2017-07-14 MED ORDER — SODIUM CHLORIDE 0.9 % IV SOLN
500.0000 mg | Freq: Once | INTRAVENOUS | Status: AC
  Administered 2017-07-14: 500 mg via INTRAVENOUS
  Filled 2017-07-14: qty 500

## 2017-07-14 MED ORDER — SODIUM CHLORIDE 0.9 % IV SOLN
1.0000 g | Freq: Once | INTRAVENOUS | Status: AC
  Administered 2017-07-14: 1 g via INTRAVENOUS
  Filled 2017-07-14: qty 10

## 2017-07-14 NOTE — ED Triage Notes (Signed)
Per EMS, patient from home reports patient has not been taking psych meds and having increased hallucinations. EMS found patient talking to the wall. A&Ox1.

## 2017-07-14 NOTE — ED Notes (Signed)
Bed: WA27 Expected date:  Expected time:  Means of arrival:  Comments: EMS-hallucination

## 2017-07-14 NOTE — ED Notes (Signed)
Pt ambulated in hallway with oxygen via n/c @ 2L; sats 93%; without oxygen sats 92%.

## 2017-07-14 NOTE — ED Notes (Signed)
Attempted to initiate IV x 2 without success.

## 2017-07-14 NOTE — ED Notes (Signed)
Patient observed talking to the wall.

## 2017-07-14 NOTE — ED Provider Notes (Signed)
[facility] DEPT Provider Note   CSN: [id number] Arrival date & time: 07/14/17  1501     History   Chief Complaint Chief Complaint  Patient presents with  . Altered Mental Status    HPI Dominique Acosta is a 67 y.o. female with a h/o of epilepsy, COPD, CAD, HLD, and schizophrenia, who presents to the emergency department by EMS from home with altered mental status.  EMS reports the patient has not been taking her psych medications and is having increased hallucinations.  Upon entering the room, the patient is noted to be talking to herself. When asked, she states that she is talking to her sister. When speaking, the patient will say a few coherent words that seem appropriate in response to a question, but will then continue to speak in incomprehensible syllables.   She is on 2L [place].   The patient's sister is listed as her emergency contact.  States that the patient lives alone.  Her sister has not spoken to her in several weeks because the patient does not have a phone.  The last time she spoke with her she states that she "seemed off" and the patient stated that she was out of all of her medications.. Her sister reports she has been receiving calls from [facility] because the patient has not been picking up her medication refills.   No other history is able to be obtained at this time.   Level V caveat secondary to AMS.   The history is provided by the EMS personnel. No language interpreter was used.    Past Medical History:  Diagnosis Date  . Arm pain   . Chronic pain   . COPD (chronic obstructive pulmonary disease) ([place])   . Coronary artery disease   . Depression   . Dystonia   . Epilepsy ([place])   . High cholesterol   . Humerus fracture   . Hypothyroid   . Insomnia   . Leukemia ([place])   . Myocardial infarct ([place])   . Psychosis ([place])   . Schizophrenia ([place])   . Seizure [facility])     Patient Active Problem List   Diagnosis Date Noted    . Hypothyroidism due to acquired atrophy of thyroid 03/21/2017  . Dystonia 03/21/2017  . Dyslipidemia 03/21/2017  . COPD (chronic obstructive pulmonary disease) ([place]) 03/21/2017  . 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 MRSA pneumonia ([place]) 03/11/2017  . Acute respiratory failure with hypoxia ([place])   . Acute encephalopathy 02/22/2017  . Gastroesophageal reflux disease without esophagitis 09/11/2016  . Insomnia 08/26/2016  . Seizure disorder ([place]) 08/26/2016  . Humerus fracture 08/18/2016  . Acute metabolic encephalopathy 55/73/2202  . Acute lower UTI 08/03/2016  . Hypokalemia 08/03/2016  . Leukocytosis 08/03/2016  . Benign essential HTN 08/03/2016  . Schizoaffective disorder, bipolar type ([place]) 07/08/2016  . Chronic constipation 09/02/2010    Past Surgical History:  Procedure Laterality Date  . BALLOON DILATION N/A 03/14/2017   Procedure: BALLOON DILATION;  Surgeon: Helayne Seminole, MD;  Location: [facility];  Service: ENT;  Laterality: N/A;  . EXTERNAL EAR SURGERY    . HUMERUS FRACTURE SURGERY    . KENALOG INJECTION N/A 03/14/2017   Procedure: KENALOG INJECTION;  Surgeon: Helayne Seminole, MD;  Location: [facility];  Service: ENT;  Laterality: N/A;  . MICROLARYNGOSCOPY WITH LASER N/A 03/14/2017   Procedure: MICROLARYNGOSCOPY WITH LASER;  Surgeon: Helayne Seminole, MD;  Location: [facility];  Service: ENT;  Laterality: N/A;  .  SHOULDER SURGERY       OB History   None      Home Medications    Prior to Admission medications   Medication Sig Start Date End Date Taking? Authorizing Provider  ADVAIR DISKUS 250-50 MCG/DOSE AEPB Take 1 puff by mouth daily. 01/12/17   [provider]  ARIPiprazole (ABILIFY) 10 MG tablet  06/29/17   [provider]  ARIPiprazole (ABILIFY) 20 MG tablet Place 1 tablet (20 mg total) into feeding tube daily. 03/18/17   Raiford Noble Latif, DO  aspirin EC 81 MG tablet Take 81 mg by mouth daily.    [provider]  atorvastatin  (LIPITOR) 20 MG tablet Take 20 mg by mouth at bedtime. Take one tablet daily for hyperlipidemia     [provider]  baclofen (LIORESAL) 10 MG tablet Take 10 mg by mouth 3 (three) times daily as needed for muscle spasms.    [provider]  benztropine (COGENTIN) 2 MG tablet Take 1 tablet (2 mg total) by mouth 3 (three) times daily. 01/26/17   [name], MD  divalproex (DEPAKOTE) 250 MG DR tablet Take 1 tablet (250 mg total) by mouth every 8 (eight) hours. 03/17/17   Raiford Noble Latif, DO  folic acid (FOLVITE) 1 MG tablet Place 1 tablet (1 mg total) into feeding tube daily. 03/18/17   Sheikh, Omair Latif, DO  ipratropium-albuterol (DUONEB) 0.5-2.5 (3) MG/3ML SOLN Take 3 mLs by nebulization 2 (two) times daily. 03/17/17   Raiford Noble Latif, DO  levETIRAcetam (KEPPRA) 1000 MG tablet Take 1 tablet (1,000 mg total) by mouth 2 (two) times daily. 03/17/17   Raiford Noble Latif, DO  levothyroxine (SYNTHROID, LEVOTHROID) 100 MCG tablet Take 1 tablet (100 mcg total) by mouth daily before breakfast. 03/18/17   Raiford Noble Latif, DO  LORazepam (ATIVAN) 1 MG tablet Take 1 mg by mouth at bedtime.  06/09/17   [provider]  Nutritional Supplements (NUTRITIONAL SUPPLEMENT PO) Take by mouth. House 2.0 - Give by mouth two times daily for supplement    [provider]  omeprazole (PRILOSEC) 20 MG capsule  06/09/17   [provider]  ondansetron (ZOFRAN) 8 MG tablet Take 1 tablet (8 mg total) by mouth every 8 (eight) hours as needed for nausea or vomiting. 01/26/17   [name], MD  PROAIR HFA 108 [phone] Base) MCG/ACT inhaler Inhale 1 puff into the lungs every 4 (four) hours as needed for shortness of breath.  01/31/17   [provider]  QUEtiapine (SEROQUEL) 25 MG tablet  05/12/17   [provider]  senna (SENOKOT) 8.6 MG TABS tablet Take 1 tablet by mouth 2 (two) times daily. 03/18/17   [provider]  thiamine 100 MG tablet Take 100 mg by  mouth daily.    [provider]  venlafaxine XR (EFFEXOR-XR) 150 MG 24 hr capsule Take 150 mg by mouth daily with breakfast.     [provider]    Family History Family History  Problem Relation Age of Onset  . Cancer Mother   . Cancer Father     Social History Social History   Tobacco Use  . Smoking status: Former Smoker    Packs/day: 2.00    Years: 41.00    Pack years: 82.00    Types: Cigarettes  . Smokeless tobacco: Never Used  Substance Use Topics  . Alcohol use: No  . Drug use: No     Allergies   Amoxicillin; Penicillins; Phenytoin; Keflex [cephalexin];  Dilaudid [hydromorphone hcl]; Haldol [haloperidol]; and Morphine and related   Review of Systems Review of Systems  Unable to perform ROS: Mental status change  Psychiatric/Behavioral: Positive for hallucinations.   Physical Exam Updated Vital Signs BP (!) 143/88 (BP Location: Right Arm)   Pulse 97   Temp 99.6 F (37.6 C) (Oral)   Resp 20   Ht 5' (1.524 m)   Wt 81.6 kg (180 lb)   SpO2 93%   BMI 35.15 kg/m   Physical Exam  Constitutional: She appears well-developed and well-nourished.  Strong odor of urine.  HENT:  Head: Normocephalic and atraumatic.  Eyes: Conjunctivae are normal.  Neck: Normal range of motion.  Cardiovascular: Normal rate, regular rhythm, normal heart sounds and intact distal pulses. Exam reveals no gallop and no friction rub.  No murmur heard. Pulmonary/Chest: Effort normal and breath sounds normal. No stridor. No respiratory distress. She has no wheezes. She has no rales. She exhibits no tenderness.  Abdominal: Soft. Bowel sounds are normal. She exhibits no distension and no mass. There is no tenderness. There is no rebound and no guarding. No hernia.  Obese abdomen.   Musculoskeletal: Normal range of motion. She exhibits no edema.  Neurological: She is alert.  GCS 15. Follows simple commands. 5/5 strength against resistance of the bilateral upper and lower  extremities.  She is not oriented to person, place, or self.  She will speak one to two words, but the rest of her speak is incomprehensible syllables.   Skin: Capillary refill takes less than 2 seconds. No rash noted.  Psychiatric: Her affect is labile. She is agitated and actively hallucinating. She is noncommunicative.  Upon entry into the room, the patient was noted to be talking to the wall.  When asked if she was speaking to, she said her sister.  Nursing note and vitals reviewed.  ED Treatments / Results  Labs (all labs ordered are listed, but only abnormal results are displayed) Labs Reviewed  COMPREHENSIVE METABOLIC PANEL - Abnormal; Notable for the following components:      Result Value   Total Protein 8.4 (*)    All other components within normal limits  ACETAMINOPHEN LEVEL - Abnormal; Notable for the following components:   Acetaminophen (Tylenol), Serum <10 (*)    All other components within normal limits  VALPROIC ACID LEVEL - Abnormal; Notable for the following components:   Valproic Acid Lvl <10 (*)    All other components within normal limits  TSH - Abnormal; Notable for the following components:   TSH 39.617 (*)    All other components within normal limits  CBC  AMMONIA  SALICYLATE LEVEL  ETHANOL  T4, FREE  BRAIN NATRIURETIC PEPTIDE  PROCALCITONIN  URINALYSIS, ROUTINE W REFLEX MICROSCOPIC  RAPID URINE DRUG SCREEN, HOSP PERFORMED  I-STAT TROPONIN, ED  I-STAT CG4 LACTIC ACID, ED    EKG None  Radiology Dg Chest 2 View  Result Date: 07/14/2017 CLINICAL DATA:  Altered mental status beginning today. EXAM: CHEST - 2 VIEW COMPARISON:  03/22/2017 FINDINGS: Poor inspiration. Normal heart size. Patchy density in both lungs in a perihilar distribution. The findings could be due to bilateral pneumonia or early congestive heart failure. No effusions. No acute bone finding. IMPRESSION: Poor inspiration. Bilateral perihilar density. Differential diagnosis is pneumonia  versus early congestive heart failure. Electronically Signed   By: Nelson Chimes M.D.   On: 07/14/2017 16:28   Ct Head Wo Contrast  Result Date: 07/14/2017 CLINICAL DATA:  Per EMS, patient from  home reports patient has not been taking psych meds and having increased hallucinations. EMS found patient talking to the wall. AANDOx1. EXAM: CT HEAD WITHOUT CONTRAST TECHNIQUE: Contiguous axial images were obtained from the base of the skull through the vertex without intravenous contrast. COMPARISON:  02/22/2017 FINDINGS: Brain: No evidence of acute infarction, hemorrhage, hydrocephalus, extra-axial collection or mass lesion/mass effect. Vascular: No hyperdense vessel or unexpected calcification. Skull: Normal. Negative for fracture or focal lesion. Sinuses/Orbits: Visualized globes and orbits are unremarkable. Small amount of dependent secretions in in the right sphenoid sinus. Visualized sinuses are otherwise clear. Clear mastoid air cells. Other: None. IMPRESSION: 1. No acute intracranial abnormalities. Stable intracranial appearance from the prior exam. Electronically Signed   By: Lajean Manes M.D.   On: 07/14/2017 18:49    Procedures Procedures (including critical care time)  Medications Ordered in ED Medications  cefTRIAXone (ROCEPHIN) 1 g in sodium chloride 0.9 % 100 mL IVPB (0 g Intravenous Stopped 07/14/17 2147)  azithromycin (ZITHROMAX) 500 mg in sodium chloride 0.9 % 250 mL IVPB (0 mg Intravenous Stopped 07/14/17 2300)     Initial Impression / Assessment and Plan / ED Course  I have reviewed the triage vital signs and the nursing notes.  Pertinent labs & imaging results that were available during my care of the patient were reviewed by me and considered in my medical decision making (see chart for details).  Clinical Course as of Jul 16 35  Fri Jul 14, 2017  2050 Patient Recheck. Patient is now speaking in complete, fluent sentences. Able to state her name, place, month, and year.   [MM]     Clinical Course User Index [MM] McDonald, Mia A, PA-C    67 year old female with a h/o of epilepsy, COPD, CAD, HLD, and schizophrenia presenting by EMS from home with altered mental status.  History is limited as the patient lives alone and has not been seen by family over the last few weeks.  Spoke with the patient's sister who reports she has been out of her medication for several weeks.  This patient has been seen by me once previously several months ago when she was having hallucinations, but was A&O and no dysarthria.  On initial examination and repeat examination, patient is not oriented to person, place, or self.  She will begin speaking and after few words will begin to speak in incomprehsible syllables.  A review of the patient's medical record demonstrates the patient was hospitalized in November with acute encephalopathy and aspiration pneumonia at that time.  The patient was also seen and evaluated by Dr. Ellender Hose, attending physician with similar examination.  CT head is negative.  Ammonia is normal.  EKG is unchanged from previous.  Labs are otherwise reassuring.  CXR with bilateral perihilar density concerning for pneumonia versus early CHF.  BNP is normal.  Patient is placed on 2 L [place], which is new. SaO2 at 93-97% on RA at rest.  Treated with ceftriaxone and azithromycin initially for questionable CAP. UA is pending.   Initially planned for admission for possible pneumonia as source of altered mental status.  On third reevaluation, the patient is now ANO x3.  Dysarthria has resolved and the patient's mentation has improved to baseline she is not taking her medications.  This includes responding to internal stimuli and responding to most questions appropriately.  Differential diagnosis includes UTI versus hallucinations secondary to untreated schizoaffective disorder.  Patient care transferred to [place] at the end of my shift pending UA.  If UA concerning for infection, treatment should be  initiated in the ED.  Patient will need TTS consult.  If TTS recommends discharge home, patient may require social work consult as the patient lives alone. Patient presentation, ED course, and plan of care discussed with review of all pertinent labs and imaging. Please see his/her note for further details regarding further ED course and disposition.   Final Clinical Impressions(s) / ED Diagnoses   Final diagnoses:  None    ED Discharge Orders    None       McDonald, Mia A, PA-C 07/15/17 0037    Duffy Bruce, MD 07/15/17 1021

## 2017-07-15 LAB — URINALYSIS, ROUTINE W REFLEX MICROSCOPIC
BACTERIA UA: NONE SEEN
Bilirubin Urine: NEGATIVE
GLUCOSE, UA: NEGATIVE mg/dL
Hgb urine dipstick: NEGATIVE
KETONES UR: 5 mg/dL — AB
LEUKOCYTES UA: NEGATIVE
Nitrite: NEGATIVE
PROTEIN: NEGATIVE mg/dL
SQUAMOUS EPITHELIAL / LPF: NONE SEEN
Specific Gravity, Urine: 1.024 (ref 1.005–1.030)
pH: 5 (ref 5.0–8.0)

## 2017-07-15 LAB — RAPID URINE DRUG SCREEN, HOSP PERFORMED
AMPHETAMINES: NOT DETECTED
BENZODIAZEPINES: NOT DETECTED
Barbiturates: NOT DETECTED
Cocaine: NOT DETECTED
Opiates: NOT DETECTED
Tetrahydrocannabinol: NOT DETECTED

## 2017-07-15 MED ORDER — VENLAFAXINE HCL ER 75 MG PO CP24
150.0000 mg | ORAL_CAPSULE | Freq: Two times a day (BID) | ORAL | Status: DC
  Administered 2017-07-15 – 2017-07-19 (×9): 150 mg via ORAL
  Filled 2017-07-15 (×9): qty 2

## 2017-07-15 MED ORDER — ARIPIPRAZOLE 5 MG PO TABS
5.0000 mg | ORAL_TABLET | Freq: Two times a day (BID) | ORAL | Status: DC
  Administered 2017-07-15 – 2017-07-19 (×9): 5 mg via ORAL
  Filled 2017-07-15 (×9): qty 1

## 2017-07-15 MED ORDER — ARIPIPRAZOLE 10 MG PO TABS: 10.0000 mg | ORAL_TABLET | Freq: Two times a day (BID) | ORAL | Status: DC

## 2017-07-15 MED ORDER — HYDROXYZINE HCL 10 MG PO TABS
10.0000 mg | ORAL_TABLET | Freq: Two times a day (BID) | ORAL | Status: DC
  Administered 2017-07-15 – 2017-07-19 (×9): 10 mg via ORAL
  Filled 2017-07-15 (×9): qty 1

## 2017-07-15 MED ORDER — LEVETIRACETAM 500 MG PO TABS
1000.0000 mg | ORAL_TABLET | Freq: Two times a day (BID) | ORAL | Status: DC
  Administered 2017-07-15 – 2017-07-19 (×9): 1000 mg via ORAL
  Filled 2017-07-15 (×9): qty 2

## 2017-07-15 MED ORDER — DIVALPROEX SODIUM 250 MG PO DR TAB: 250.0000 mg | DELAYED_RELEASE_TABLET | Freq: Three times a day (TID) | ORAL | Status: DC

## 2017-07-15 MED ORDER — DIVALPROEX SODIUM 250 MG PO DR TAB
250.0000 mg | DELAYED_RELEASE_TABLET | Freq: Two times a day (BID) | ORAL | Status: DC
  Administered 2017-07-15 – 2017-07-19 (×9): 250 mg via ORAL
  Filled 2017-07-15 (×9): qty 1

## 2017-07-15 MED ORDER — ASPIRIN EC 81 MG PO TBEC
81.0000 mg | DELAYED_RELEASE_TABLET | Freq: Every day | ORAL | Status: DC
  Administered 2017-07-15 – 2017-07-19 (×5): 81 mg via ORAL
  Filled 2017-07-15 (×5): qty 1

## 2017-07-15 MED ORDER — LEVOTHYROXINE SODIUM 100 MCG PO TABS
100.0000 ug | ORAL_TABLET | Freq: Every day | ORAL | Status: DC
  Administered 2017-07-16 – 2017-07-19 (×4): 100 ug via ORAL
  Filled 2017-07-15 (×5): qty 1

## 2017-07-15 NOTE — ED Notes (Signed)
She has ambulated without difficulty to b.r. She is speaking in short, jibberish syllables.
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
## 2017-07-15 NOTE — BH Assessment (Signed)
[facility] Assessment Progress Note  Patient was evaluated by Darleene Cleaver MD, Marion Downer who recommended patient be admitted inpatient (Geropsychiatry). Akintayo to initiate IVC.

## 2017-07-15 NOTE — ED Notes (Signed)
Pt's sister Everly Rubalcava, emergency contact for pt, telephone number is [phone].

## 2017-07-15 NOTE — Clinical Social Work Note (Signed)
CSW faxed IVC paperwork to Magistrate. Confirmed receipt of fax by Arman Filter.  Tally Joe, [facility] ED Weekend Coverage [phone]

## 2017-07-15 NOTE — ED Provider Notes (Signed)
Care assumed from previous provider PA McDonald. Please see their note for further details to include full history and physical. To summarize in short pt is a 67 yo with history of schizophrenia, COPD, CAD resents to the ED for altered mental status.. Case discussed, plan agreed upon.  At time of care handoff was awaiting UA to rule any urinary tract infection.  If UA was normal TTS can be consulted for their recommendations and disposition.  As patient's symptoms are likely secondary to her underlying psychiatric disorders.  UA returned that shows no signs of infection.  According to prior provider patient can be medically cleared for TTS evaluation and disposition at this time.  Patient remains hemodynamically stable and resting comfortably in the bed.       Doristine Devoid, PA-C 07/15/17 0629    Ward, Delice Bison, DO 07/15/17 [phone]

## 2017-07-16 DIAGNOSIS — Z87891 Personal history of nicotine dependence: Secondary | ICD-10-CM

## 2017-07-16 DIAGNOSIS — F250 Schizoaffective disorder, bipolar type: Secondary | ICD-10-CM
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
## 2017-07-17 DIAGNOSIS — Z87891 Personal history of nicotine dependence: Secondary | ICD-10-CM | POA: Diagnosis not present

## 2017-07-17 DIAGNOSIS — R4587 Impulsiveness: Secondary | ICD-10-CM

## 2017-07-17 DIAGNOSIS — R413 Other amnesia: Secondary | ICD-10-CM | POA: Diagnosis not present

## 2017-07-17 DIAGNOSIS — F250 Schizoaffective disorder, bipolar type: Secondary | ICD-10-CM | POA: Diagnosis not present

## 2017-07-17 NOTE — ED Notes (Signed)
Pt continues talking with people that are not there.

## 2017-07-17 NOTE — BH Assessment (Signed)
[facility] Assessment Progress Note  Per Buford Dresser, DO, this pt requires psychiatric hospitalization at this time.  Pt presents under IVC initiated by Corena Pilgrim, MD.  The following facilities have been contacted to seek placement for this pt, with results as noted:  Beds available, information sent, decision pending:  [facility]   At capacity:  [facility] [place], [place] Coordinator [phone]
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
## 2017-07-18 DIAGNOSIS — Z87891 Personal history of nicotine dependence: Secondary | ICD-10-CM | POA: Diagnosis not present

## 2017-07-18 MED ORDER — LORAZEPAM 1 MG PO TABS
2.0000 mg | ORAL_TABLET | Freq: Once | ORAL | Status: AC
  Administered 2017-07-18: 2 mg via ORAL
  Filled 2017-07-18: qty 2
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
## 2017-07-18 NOTE — Progress Notes (Signed)
CSW received a call from [facility]  Pt has been accepted by: [facility] Number for report is: [phone] Pt's unit/room/bed number will be: 4th floor Accepting physician: Dr. Ananias Pilgrim  Pt can arrive ASAP on 4/9 or ASAP in the next 24 hours.  Per Melissa, pt is IVC'd.  CSW will update RN.  PLEASE CALL MELISSA AT PH: [phone] TO LET THEM KNOW WHEN PT IS D/C'ING, thank you.  Alphonse Guild. Anneth Brunell, LCSW, LCAS, CSI Clinical Social Worker Ph: [phone]

## 2017-07-18 NOTE — ED Notes (Signed)
[facility] called and left message on answering machine.

## 2017-07-18 NOTE — BH Assessment (Signed)
[facility] Assessment Progress Note  Per Buford Dresser, DO, this pt requires psychiatric hospitalization at this time.  Pt presents under IVC initiated by Corena Pilgrim, MD.  At 13:33 Melissa calls from [facility] to report that pt has been pre-accepted to their facility by Dr Ananias Pilgrim pending availability of a private room.  They will call when bed is available.  Ethelene Hal, FNP concurs with this decision.  Pt's nurse, Lala Lund, has been notified, and agrees to call report to [phone].  Pt is to be transported via [facility] when the time comes.  [place] Coordinator [phone]

## 2017-07-19 NOTE — ED Notes (Signed)
After report was called and transport was called, nurse Adonis Huguenin at [facility] called Tom counselor and informed him that bed was no longer ready.

## 2017-07-19 NOTE — BH Assessment (Signed)
[facility] Assessment Progress Note  At 15:52 Adonis Huguenin calls from [facility].  She reports that they are ready to receive this pt.  Ethelene Hal, FNP, and pt's nurse have been notified.  Jalene Mullet, [place] Coordinator [phone]

## 2017-07-19 NOTE — BH Assessment (Signed)
[facility] Assessment Progress Note  At 08:32 Adonis Huguenin calls from [facility] to report that pt's bed is not ready.  She will call when the bed becomes available.  Ethelene Hal, NP, and pt's nurse have been notified.  Jalene Mullet, [place] Coordinator [phone]

## 2017-07-19 NOTE — ED Notes (Signed)
Transport called again, reports that they may be able to transport later today or tomorrow

## 2017-07-19 NOTE — ED Notes (Signed)
Report given to Contractor at [facility].

## 2017-11-16 IMAGING — CR DG CHEST 2V
2 series · 2 of 2 positions shown · non-contrast
Comparison: 07/28/2016

CLINICAL DATA: Low-grade fevers

EXAM:
CHEST  2 VIEW

[x chest ap]
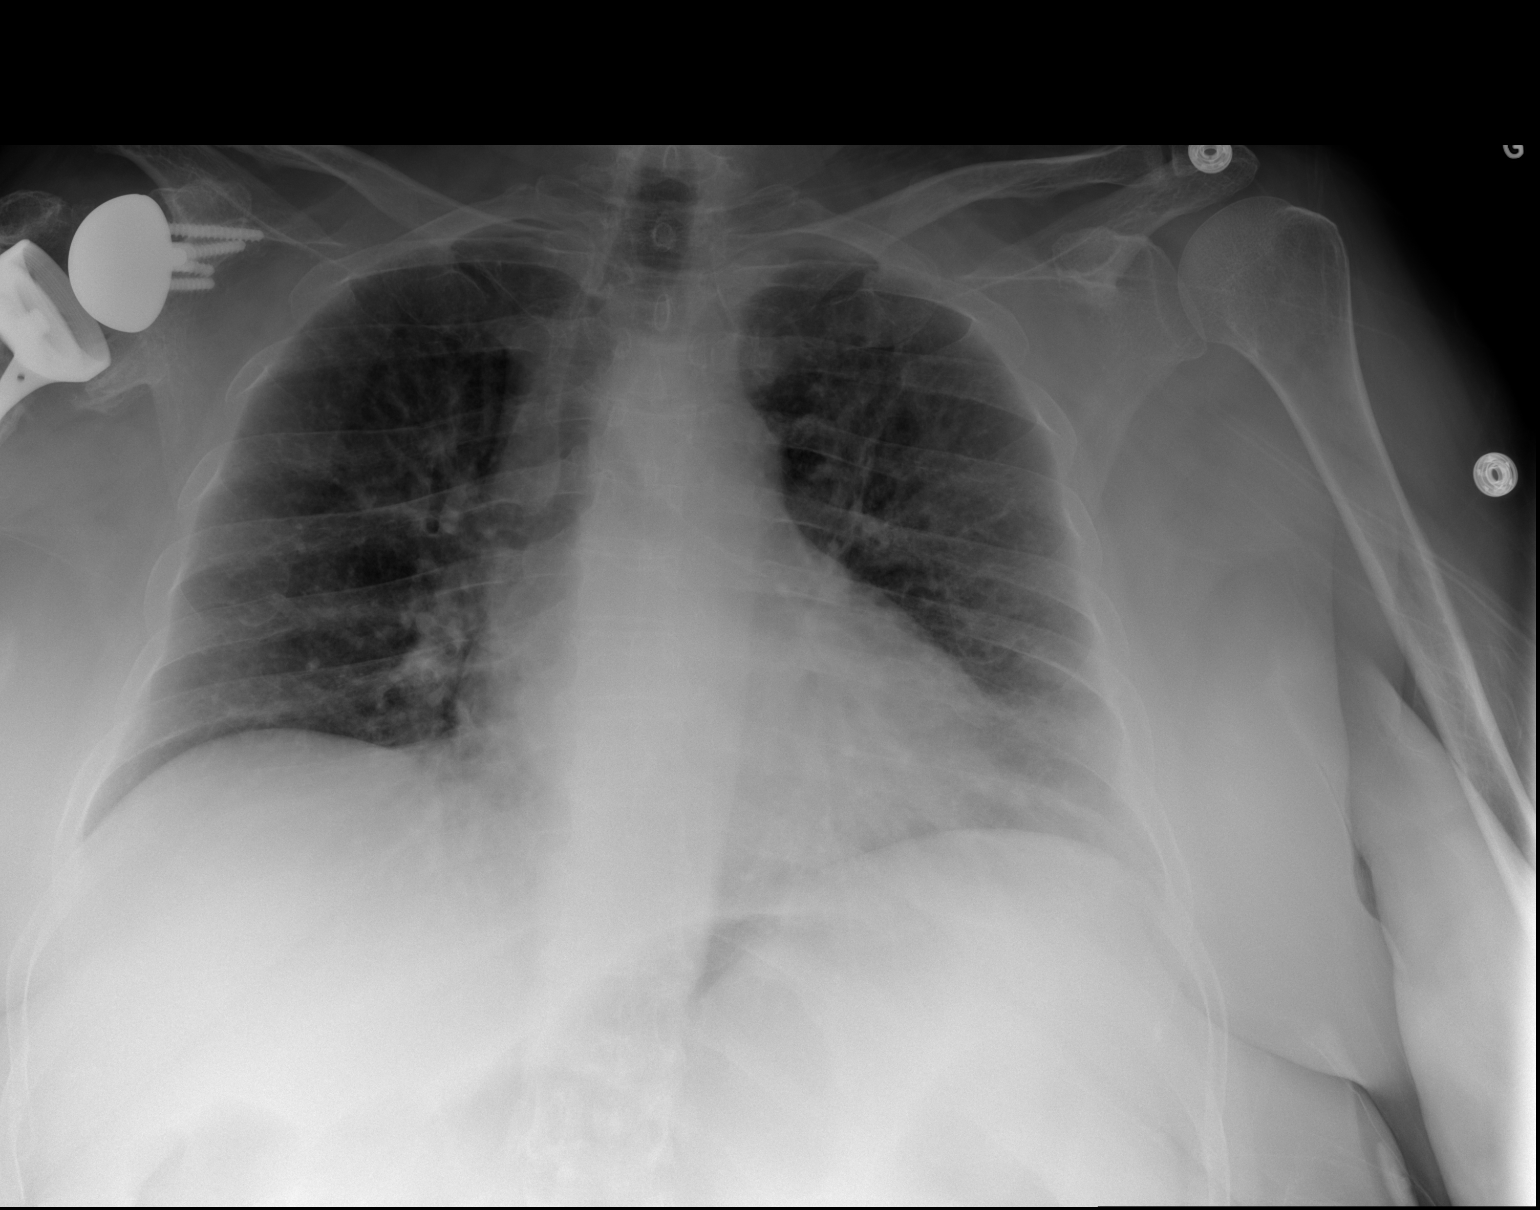

[w chest lat]
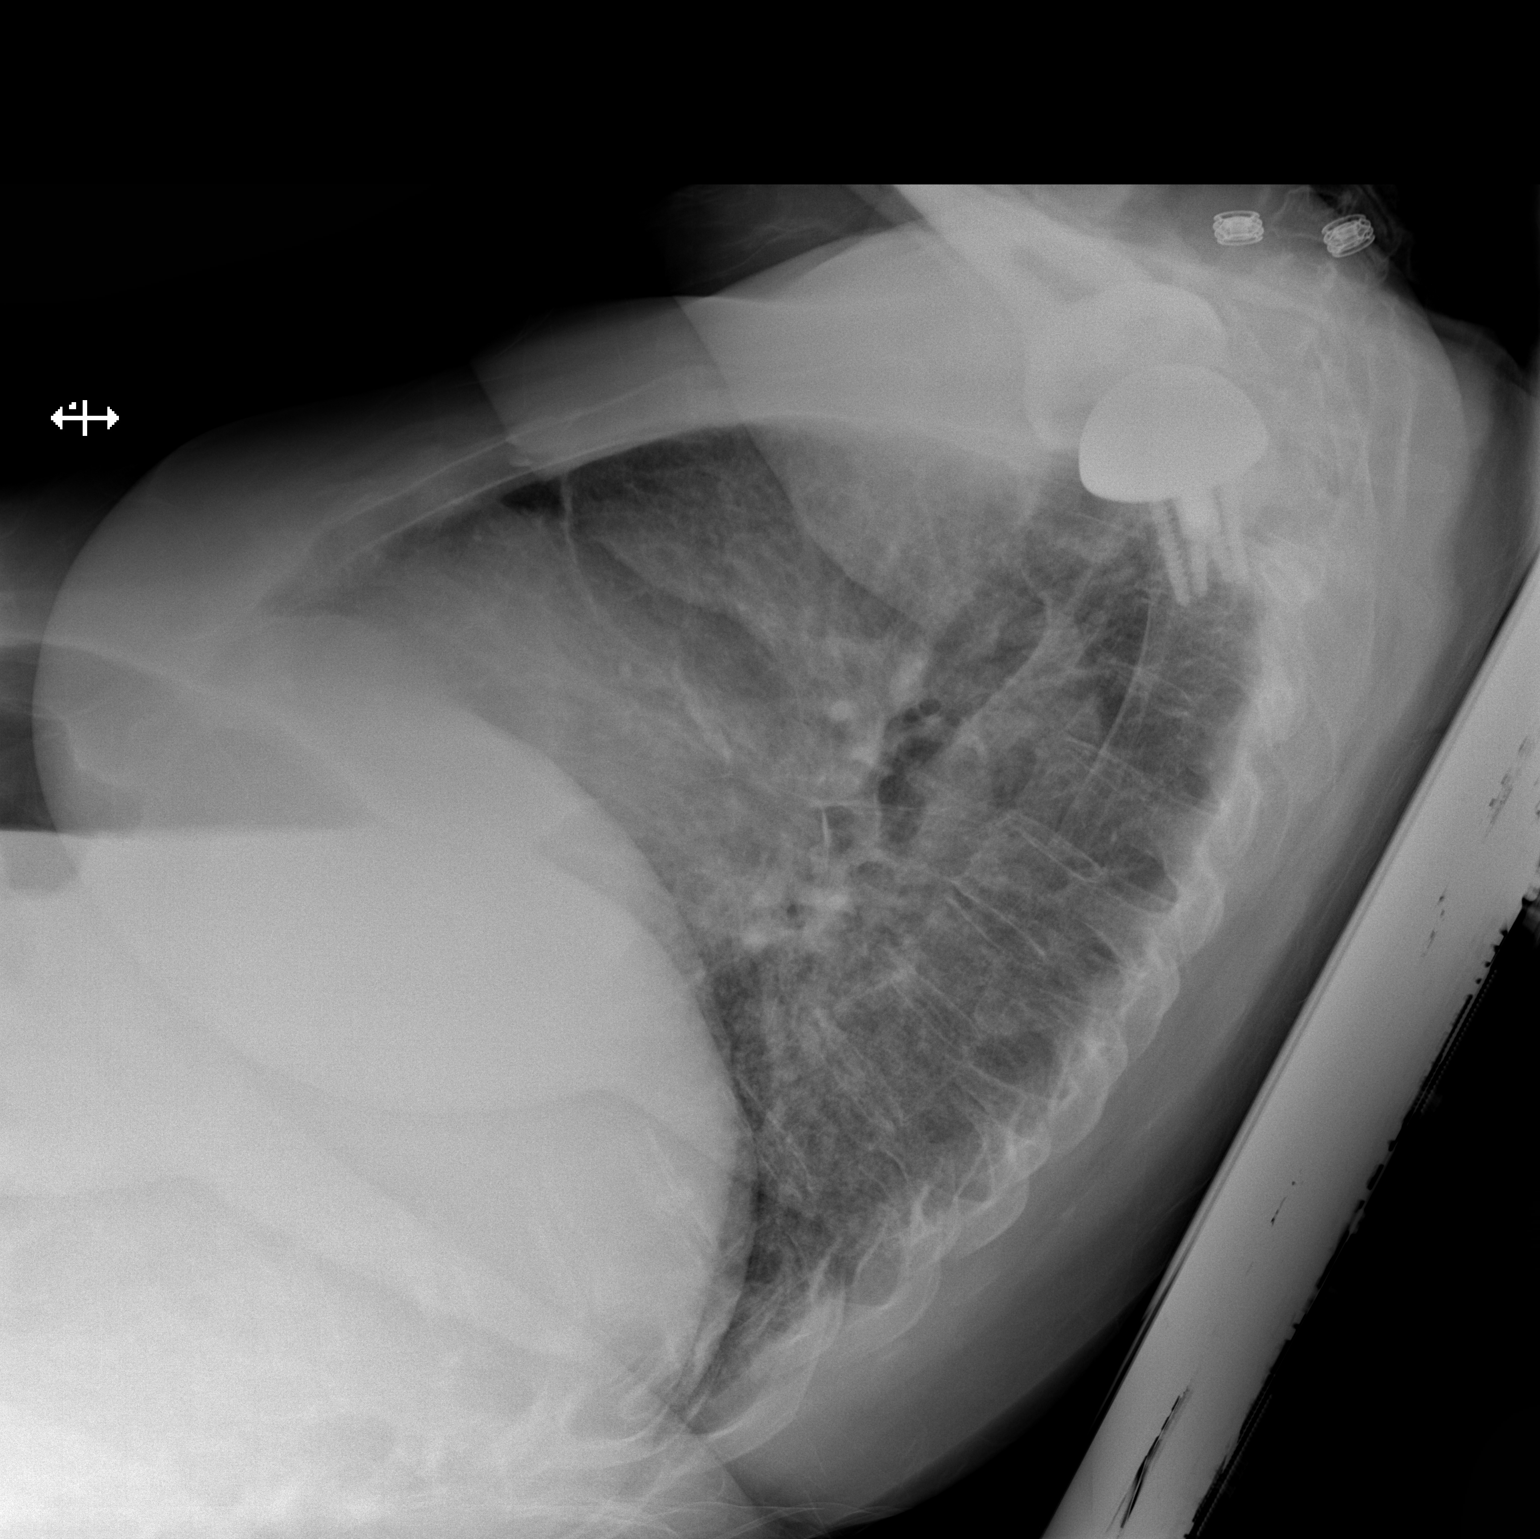

[2 of 2 positions shown; findings below may reference images not displayed]

FINDINGS: Cardiac shadow is at the upper limits of normal in size. The lungs
are hypoinflated with some crowding of the vascular markings. This
is stable from the prior exam. Mild left basilar atelectasis is
noted. No sizable effusion is seen. Postsurgical changes in the
right shoulder are noted.
IMPRESSION: Stable hypoinflation and mild left basilar atelectasis.

## 2017-12-06 IMAGING — CR DG SHOULDER 2+V*L*
2 series · 2 of 2 positions shown · non-contrast
Comparison: None.

CLINICAL DATA: Left shoulder pain, post fall.

EXAM:
LEFT SHOULDER - 2+ VIEW

[x shoulder ap left]
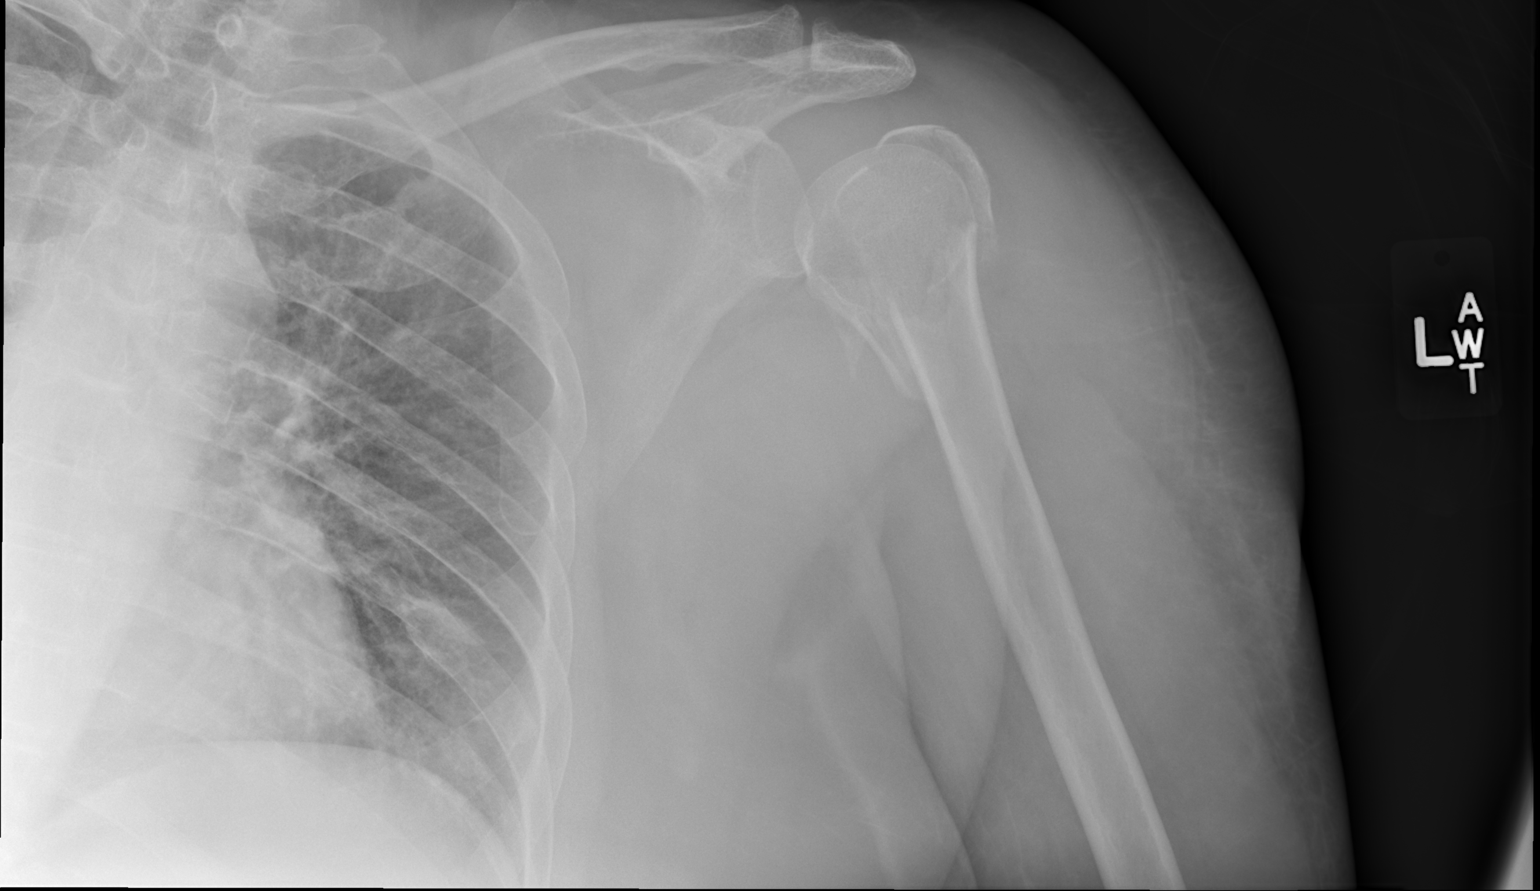

[x shoulder axillary left]
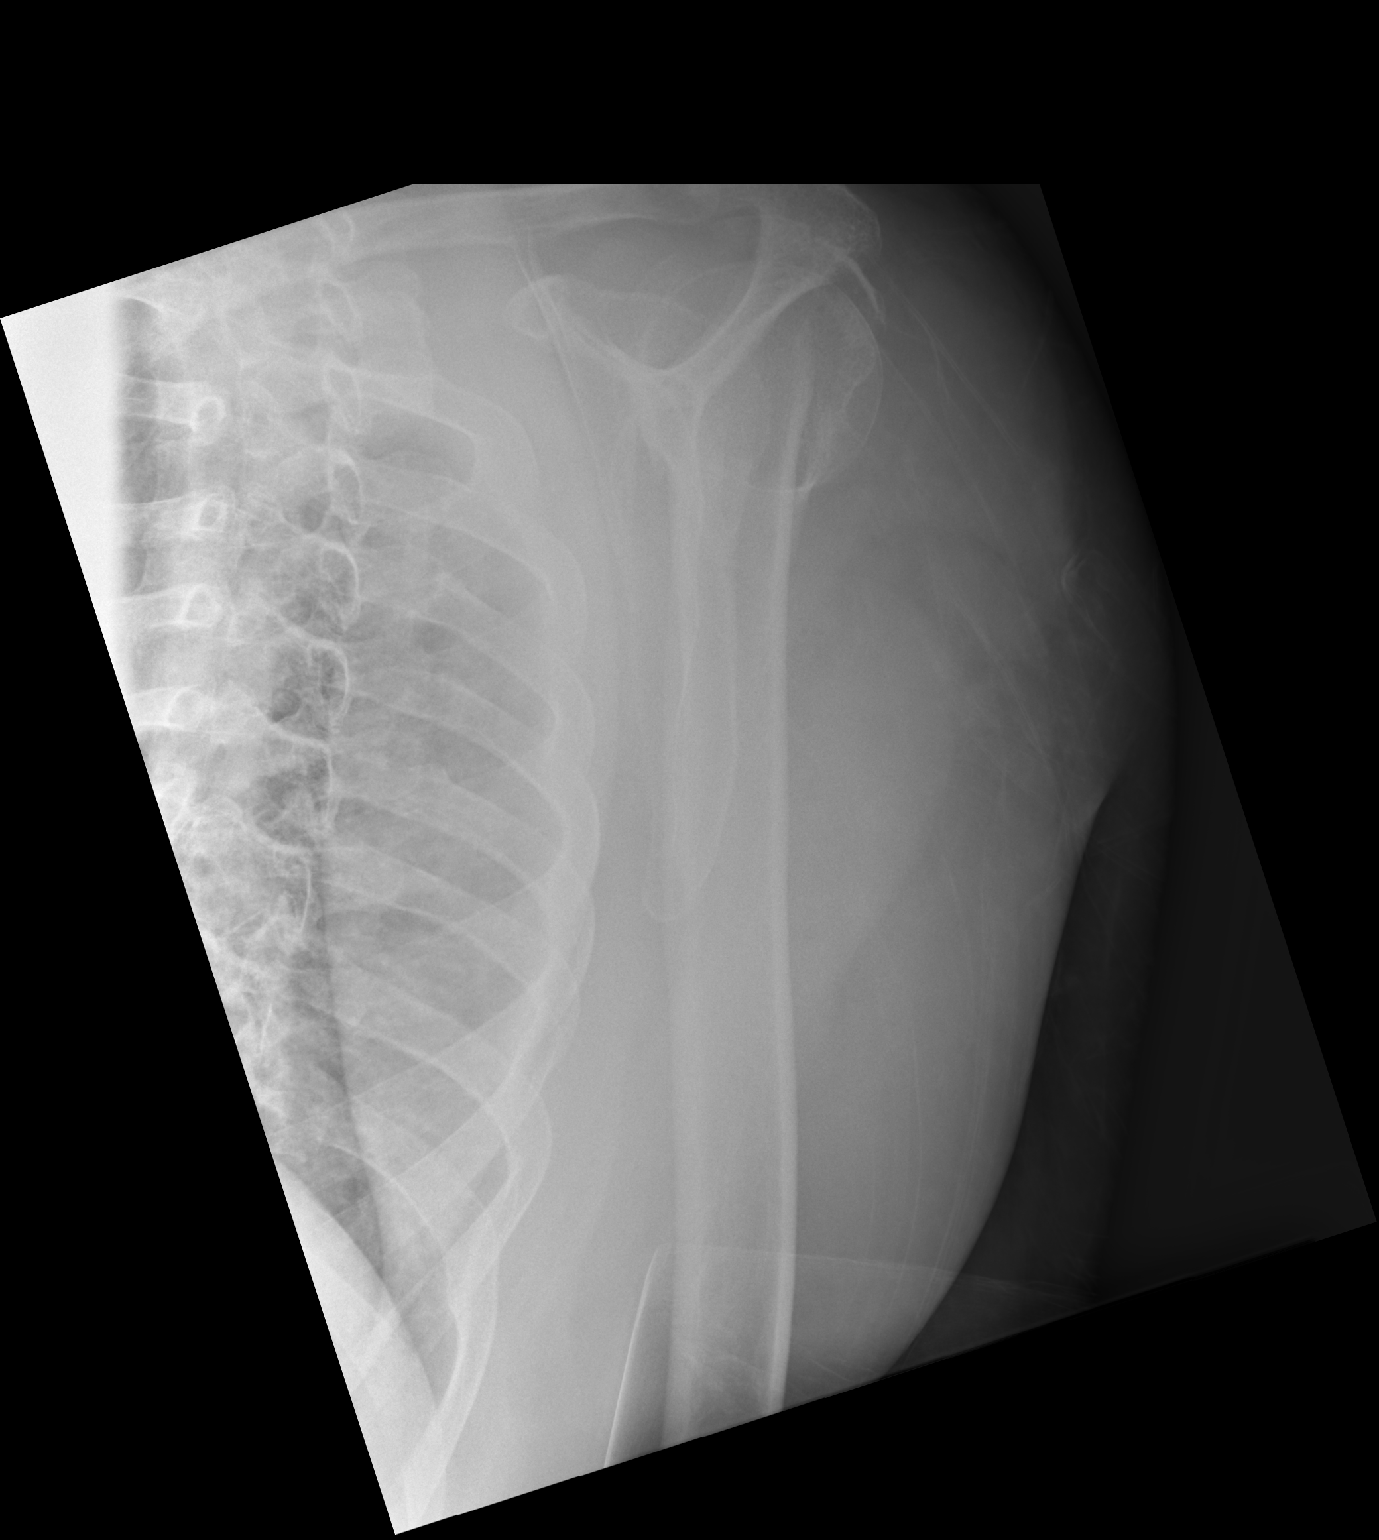

[2 of 2 positions shown; findings below may reference images not displayed]

FINDINGS: There is a comminuted intra-articular fracture of the left humeral
head with extension to the surgical neck. The fracture is impacted
with posterior dislocation of the posterior fracture fragment. The
humeral head is inferiorly and posteriorly subluxed, which may be
due to intra-articular hematoma.
IMPRESSION: Comminuted impacted intra-articular fracture of the left humeral
head/surgical neck with mild posterior inferior subluxation, which
may be due to intracapsular hematoma.

## 2018-01-22 ENCOUNTER — Other Ambulatory Visit: Payer: Self-pay | Admitting: Internal Medicine

## 2018-01-22 DIAGNOSIS — N644 Mastodynia: Secondary | ICD-10-CM

## 2018-03-16 ENCOUNTER — Observation Stay (HOSPITAL_COMMUNITY)
Admission: EM | Admit: 2018-03-16 | Discharge: 2018-03-17 | Disposition: A | Discharge status: Home / Self Care | Payer: Medicare Other | Attending: Emergency Medicine | Admitting: Emergency Medicine

## 2018-03-16 ENCOUNTER — Encounter (HOSPITAL_COMMUNITY): Payer: Self-pay

## 2018-03-16 ENCOUNTER — Other Ambulatory Visit: Payer: Self-pay

## 2018-03-16 DIAGNOSIS — I10 Essential (primary) hypertension: Secondary | ICD-10-CM | POA: Insufficient documentation

## 2018-03-16 DIAGNOSIS — J449 Chronic obstructive pulmonary disease, unspecified: Secondary | ICD-10-CM | POA: Insufficient documentation

## 2018-03-16 DIAGNOSIS — Z87891 Personal history of nicotine dependence: Secondary | ICD-10-CM | POA: Insufficient documentation

## 2018-03-16 DIAGNOSIS — I252 Old myocardial infarction: Secondary | ICD-10-CM | POA: Insufficient documentation

## 2018-03-16 DIAGNOSIS — F250 Schizoaffective disorder, bipolar type: Principal | ICD-10-CM | POA: Insufficient documentation

## 2018-03-16 DIAGNOSIS — K219 Gastro-esophageal reflux disease without esophagitis: Secondary | ICD-10-CM | POA: Insufficient documentation

## 2018-03-16 DIAGNOSIS — B372 Candidiasis of skin and nail: Secondary | ICD-10-CM | POA: Diagnosis not present

## 2018-03-16 DIAGNOSIS — Z7982 Long term (current) use of aspirin: Secondary | ICD-10-CM | POA: Diagnosis not present

## 2018-03-16 DIAGNOSIS — E785 Hyperlipidemia, unspecified: Secondary | ICD-10-CM | POA: Insufficient documentation

## 2018-03-16 DIAGNOSIS — Z79899 Other long term (current) drug therapy: Secondary | ICD-10-CM | POA: Diagnosis not present

## 2018-03-16 DIAGNOSIS — I2510 Atherosclerotic heart disease of native coronary artery without angina pectoris: Secondary | ICD-10-CM | POA: Insufficient documentation

## 2018-03-16 DIAGNOSIS — R4182 Altered mental status, unspecified: Secondary | ICD-10-CM | POA: Diagnosis present

## 2018-03-16 DIAGNOSIS — N63 Unspecified lump in unspecified breast: Secondary | ICD-10-CM | POA: Insufficient documentation

## 2018-03-16 DIAGNOSIS — F23 Brief psychotic disorder: Secondary | ICD-10-CM

## 2018-03-16 LAB — VALPROIC ACID LEVEL: Valproic Acid Lvl: <10 ug/mL — ABNORMAL LOW (ref 50.0–100.0)

## 2018-03-16 LAB — CBC
HCT: 42.1 % (ref 36.0–46.0)
Hemoglobin: 13 g/dL (ref 12.0–15.0)
MCH: 29.1 pg (ref 26.0–34.0)
MCHC: 30.9 g/dL (ref 30.0–36.0)
MCV: 94.2 fL (ref 80.0–100.0)
PLATELETS: 342 10*3/uL (ref 150–400)
RBC: 4.47 MIL/uL (ref 3.87–5.11)
RDW: 13.7 % (ref 11.5–15.5)
WBC: 13.3 10*3/uL — ABNORMAL HIGH (ref 4.0–10.5)
nRBC: 0 % (ref 0.0–0.2)

## 2018-03-16 LAB — COMPREHENSIVE METABOLIC PANEL
ALT: 24 U/L (ref 0–44)
AST: 30 U/L (ref 15–41)
Albumin: 4 g/dL (ref 3.5–5.0)
Alkaline Phosphatase: 67 U/L (ref 38–126)
Anion gap: 12 (ref 5–15)
BUN: 22 mg/dL (ref 8–23)
CALCIUM: 9.2 mg/dL (ref 8.9–10.3)
CO2: 27 mmol/L (ref 22–32)
Chloride: 104 mmol/L (ref 98–111)
Creatinine, Ser: 1.21 mg/dL — ABNORMAL HIGH (ref 0.44–1.00)
GFR calc Af Amer: 53 mL/min — ABNORMAL LOW (ref >60)
GFR calc non Af Amer: 46 mL/min — ABNORMAL LOW (ref >60)
Glucose, Bld: 99 mg/dL (ref 70–99)
Potassium: 3.9 mmol/L (ref 3.5–5.1)
Sodium: 143 mmol/L (ref 135–145)
Total Bilirubin: 0.6 mg/dL (ref 0.3–1.2)
Total Protein: 7.3 g/dL (ref 6.5–8.1)

## 2018-03-16 LAB — TSH: TSH: 15.643 u[IU]/mL — ABNORMAL HIGH (ref 0.350–4.500)

## 2018-03-16 LAB — RAPID URINE DRUG SCREEN, HOSP PERFORMED
Amphetamines: NOT DETECTED
Barbiturates: NOT DETECTED
Benzodiazepines: POSITIVE — AB
Cocaine: NOT DETECTED
Opiates: NOT DETECTED
TETRAHYDROCANNABINOL: NOT DETECTED

## 2018-03-16 LAB — ETHANOL: Alcohol, Ethyl (B): <10 mg/dL (ref <10)

## 2018-03-16 LAB — CBG MONITORING, ED: Glucose-Capillary: 96 mg/dL (ref 70–99)

## 2018-03-16 MED ORDER — FOLIC ACID 1 MG PO TABS
1.0000 mg | ORAL_TABLET | Freq: Every day | ORAL | Status: DC
  Administered 2018-03-16 – 2018-03-17 (×2): 1 mg
  Filled 2018-03-16 (×2): qty 1

## 2018-03-16 MED ORDER — BENZTROPINE MESYLATE 1 MG PO TABS
2.0000 mg | ORAL_TABLET | Freq: Three times a day (TID) | ORAL | Status: DC
  Administered 2018-03-16 – 2018-03-17 (×2): 2 mg via ORAL
  Filled 2018-03-16 (×2): qty 2

## 2018-03-16 MED ORDER — DIVALPROEX SODIUM 250 MG PO DR TAB
250.0000 mg | DELAYED_RELEASE_TABLET | Freq: Three times a day (TID) | ORAL | Status: DC
  Administered 2018-03-16 – 2018-03-17 (×2): 250 mg via ORAL
  Filled 2018-03-16 (×2): qty 1

## 2018-03-16 MED ORDER — ASPIRIN EC 81 MG PO TBEC
81.0000 mg | DELAYED_RELEASE_TABLET | Freq: Every day | ORAL | Status: DC
  Administered 2018-03-16 – 2018-03-17 (×2): 81 mg via ORAL
  Filled 2018-03-16 (×2): qty 1

## 2018-03-16 MED ORDER — PANTOPRAZOLE SODIUM 40 MG PO TBEC
40.0000 mg | DELAYED_RELEASE_TABLET | Freq: Every day | ORAL | Status: DC
  Administered 2018-03-16 – 2018-03-17 (×2): 40 mg via ORAL
  Filled 2018-03-16 (×2): qty 1

## 2018-03-16 MED ORDER — LEVOTHYROXINE SODIUM 112 MCG PO TABS
112.0000 ug | ORAL_TABLET | Freq: Every day | ORAL | Status: DC
  Filled 2018-03-16: qty 1

## 2018-03-16 MED ORDER — NYSTATIN 100000 UNIT/GM EX POWD
Freq: Once | CUTANEOUS | Status: DC
  Filled 2018-03-16: qty 15

## 2018-03-16 MED ORDER — QUETIAPINE FUMARATE 25 MG PO TABS: 25.0000 mg | ORAL_TABLET | Freq: Every evening | ORAL | Status: DC | PRN

## 2018-03-16 MED ORDER — ONDANSETRON HCL 4 MG PO TABS: 8.0000 mg | ORAL_TABLET | Freq: Three times a day (TID) | ORAL | Status: DC | PRN

## 2018-03-16 MED ORDER — VENLAFAXINE HCL ER 150 MG PO CP24
150.0000 mg | ORAL_CAPSULE | Freq: Every day | ORAL | Status: DC
  Filled 2018-03-16: qty 1

## 2018-03-16 MED ORDER — LORAZEPAM 1 MG PO TABS
1.0000 mg | ORAL_TABLET | Freq: Every evening | ORAL | Status: DC | PRN
  Administered 2018-03-16: 1 mg via ORAL
  Filled 2018-03-16: qty 1

## 2018-03-16 MED ORDER — LEVETIRACETAM 500 MG PO TABS
1000.0000 mg | ORAL_TABLET | Freq: Two times a day (BID) | ORAL | Status: DC
  Administered 2018-03-16 – 2018-03-17 (×2): 1000 mg via ORAL
  Filled 2018-03-16 (×3): qty 2

## 2018-03-16 MED ORDER — IPRATROPIUM-ALBUTEROL 0.5-2.5 (3) MG/3ML IN SOLN: 3.0000 mL | Freq: Two times a day (BID) | RESPIRATORY_TRACT | Status: DC

## 2018-03-16 MED ORDER — BACLOFEN 10 MG PO TABS
10.0000 mg | ORAL_TABLET | Freq: Three times a day (TID) | ORAL | Status: DC | PRN
  Administered 2018-03-17: 10 mg via ORAL
  Filled 2018-03-16: qty 1

## 2018-03-16 MED ORDER — ARIPIPRAZOLE 10 MG PO TABS
10.0000 mg | ORAL_TABLET | Freq: Two times a day (BID) | ORAL | Status: DC
  Administered 2018-03-17: 10 mg via ORAL
  Filled 2018-03-16 (×2): qty 1

## 2018-03-16 NOTE — ED Triage Notes (Signed)
Pt from home with ems for reports of vaginal and rectal bleeding for 9 days. Pt seems to be responding to auditory and visual hallucinations. Pt alert and oriented x4, answering questions appropriately. Hx of schizophrenia and psychosis.

## 2018-03-16 NOTE — ED Notes (Signed)
Pt noted to have a red rash under right breast as well as a hard lump in the same breast. Pt expressing concern over both issues.

## 2018-03-16 NOTE — ED Provider Notes (Signed)
[facility] EMERGENCY DEPARTMENT Provider Note   CSN: [id number] Arrival date & time: 03/16/18  1417     History   Chief Complaint Chief Complaint  Patient presents with  . Altered Mental Status    HPI Jessiah Wojnar is a 67 y.o. female.  HPI  The patient is a 67 year old female, she has known history of COPD, epilepsy as well as psychosis and schizophrenia, she has had a prior history of seizures, myocardial infarction and a reported history of leukemia.  She presents to the hospital today with a complaint of altered mental status the patient is not able to give me very much in the way of information but at the age of 4 today she tells me that she thought she was having a baby and miscarrying earlier today because of vaginal bleeding.  She is evidently coming by paramedics from a home, she states that she has had both vaginal and rectal bleeding for approximately 9 days, according to the paramedic she was apparently responding to both auditory and visual hallucinations.  When I asked the patient what is the complaint she states that she hurts all over and is having vaginal bleeding, when I try to clarify what hurts she says you know that thing, when I asked specifically if things hurt she endorses pain everywhere I asked.  She denies trauma.  Level 5 caveat applies secondary to psychosis  Past Medical History:  Diagnosis Date  . Arm pain   . Chronic pain   . COPD (chronic obstructive pulmonary disease) ([place])   . Coronary artery disease   . Depression   . Dystonia   . Epilepsy ([place])   . High cholesterol   . Humerus fracture   . Hypothyroid   . Insomnia   . Leukemia ([place])   . Myocardial infarct ([place])   . Psychosis ([place])   . Schizophrenia ([place])   . Seizure [facility])     Patient Active Problem List   Diagnosis Date Noted  . Hypothyroidism due to acquired atrophy of thyroid 03/21/2017  . Dystonia 03/21/2017  . Dyslipidemia 03/21/2017  . COPD (chronic  obstructive pulmonary disease) ([place]) 03/21/2017  . 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 MRSA pneumonia ([place]) 03/11/2017  . Acute respiratory failure with hypoxia ([place])   . Acute encephalopathy 02/22/2017  . Gastroesophageal reflux disease without esophagitis 09/11/2016  . Insomnia 08/26/2016  . Seizure disorder ([place]) 08/26/2016  . Humerus fracture 08/18/2016  . Acute metabolic encephalopathy 24/40/1027  . Acute lower UTI 08/03/2016  . Hypokalemia 08/03/2016  . Leukocytosis 08/03/2016  . Benign essential HTN 08/03/2016  . Schizoaffective disorder, bipolar type ([place]) 07/08/2016  . Chronic constipation 09/02/2010    Past Surgical History:  Procedure Laterality Date  . BALLOON DILATION N/A 03/14/2017   Procedure: BALLOON DILATION;  Surgeon: Helayne Seminole, MD;  Location: [place];  Service: ENT;  Laterality: N/A;  . EXTERNAL EAR SURGERY    . HUMERUS FRACTURE SURGERY    . KENALOG INJECTION N/A 03/14/2017   Procedure: KENALOG INJECTION;  Surgeon: Helayne Seminole, MD;  Location: [place];  Service: ENT;  Laterality: N/A;  . MICROLARYNGOSCOPY WITH LASER N/A 03/14/2017   Procedure: MICROLARYNGOSCOPY WITH LASER;  Surgeon: Helayne Seminole, MD;  Location: [place];  Service: ENT;  Laterality: N/A;  . SHOULDER SURGERY       OB History   None      Home Medications    Prior to Admission medications   Medication Sig Start Date  End Date Taking? Authorizing Provider  ARIPiprazole (ABILIFY) 10 MG tablet Take 10 mg by mouth 2 (two) times daily.  06/29/17   [provider]  ARIPiprazole (ABILIFY) 20 MG tablet Place 1 tablet (20 mg total) into feeding tube daily. 03/18/17   Raiford Noble Latif, DO  aspirin EC 81 MG tablet Take 81 mg by mouth daily.    [provider]  baclofen (LIORESAL) 10 MG tablet Take 10 mg by mouth 3 (three) times daily as needed for muscle spasms.    [provider]  benztropine (COGENTIN) 2 MG tablet Take 1 tablet (2 mg total) by mouth 3  (three) times daily. 01/26/17   [name], MD  divalproex (DEPAKOTE) 250 MG DR tablet Take 1 tablet (250 mg total) by mouth every 8 (eight) hours. Patient not taking: Reported on 07/15/2017 03/17/17   Raiford Noble Latif, DO  folic acid (FOLVITE) 1 MG tablet Place 1 tablet (1 mg total) into feeding tube daily. Patient not taking: Reported on 07/15/2017 03/18/17   Raiford Noble Latif, DO  ipratropium-albuterol (DUONEB) 0.5-2.5 (3) MG/3ML SOLN Take 3 mLs by nebulization 2 (two) times daily. Patient not taking: Reported on 07/15/2017 03/17/17   Raiford Noble Latif, DO  levETIRAcetam (KEPPRA) 1000 MG tablet Take 1 tablet (1,000 mg total) by mouth 2 (two) times daily. 03/17/17   Raiford Noble Latif, DO  levothyroxine (SYNTHROID, LEVOTHROID) 100 MCG tablet Take 1 tablet (100 mcg total) by mouth daily before breakfast. 03/18/17   Raiford Noble Latif, DO  LORazepam (ATIVAN) 1 MG tablet Take 1 mg by mouth at bedtime as needed for anxiety.  06/09/17   [provider]  omeprazole (PRILOSEC) 20 MG capsule Take 20 mg by mouth daily.  06/09/17   [provider]  ondansetron (ZOFRAN) 8 MG tablet Take 1 tablet (8 mg total) by mouth every 8 (eight) hours as needed for nausea or vomiting. Patient not taking: Reported on 07/15/2017 01/26/17   [name], MD  QUEtiapine (SEROQUEL) 25 MG tablet Take 25 mg by mouth at bedtime as needed (Sleep).  05/12/17   [provider]  venlafaxine XR (EFFEXOR-XR) 150 MG 24 hr capsule Take 150 mg by mouth 2 (two) times daily.     [provider]    Family History Family History  Problem Relation Age of Onset  . Cancer Mother   . Cancer Father     Social History Social History   Tobacco Use  . Smoking status: Former Smoker    Packs/day: 2.00    Years: 41.00    Pack years: 82.00    Types: Cigarettes  . Smokeless tobacco: Never Used  Substance Use Topics  . Alcohol use: No  . Drug use: No     Allergies   Amoxicillin; Penicillins;  Phenytoin; Keflex [cephalexin]; Dilaudid [hydromorphone hcl]; Haldol [haloperidol]; and Morphine and related   Review of Systems Review of Systems  Unable to perform ROS: Psychiatric disorder     Physical Exam Updated Vital Signs BP 140/85   Pulse (!) 110   Temp 98.5 F (36.9 C) (Oral)   Resp 16   SpO2 97%   Physical Exam  Constitutional: She appears well-developed and well-nourished. No distress.  HENT:  Head: Normocephalic and atraumatic.  Mouth/Throat: Oropharynx is clear and moist. No oropharyngeal exudate.  Eyes: Pupils are equal, round, and reactive to light. Conjunctivae and EOM are normal. Right eye exhibits no discharge. Left eye exhibits no discharge. No scleral icterus.  Neck: Normal range of  motion. Neck supple. No JVD present. No thyromegaly present.  Cardiovascular: Normal rate, regular rhythm, normal heart sounds and intact distal pulses. Exam reveals no gallop and no friction rub.  No murmur heard. Pulmonary/Chest: Effort normal and breath sounds normal. No respiratory distress. She has no wheezes. She has no rales.  Abdominal: Soft. Bowel sounds are normal. She exhibits no distension and no mass. There is no tenderness.  Musculoskeletal: Normal range of motion. She exhibits no edema or tenderness.  Lymphadenopathy:    She has no cervical adenopathy.  Neurological: She is alert. Coordination normal.  The pt can sit up with assistance, there is no tremor, she has normal strength in all 4 extremities, she can straight leg raise bilaterally, there is no pronator drift, her coordination is normal, there is no facial droop, her speech is clear.  The pupils are equal round and reactive to light and the extraocular movements are intact.  She has peripheral visual fields which are normal  Skin: Skin is warm and dry. No rash noted. No erythema.  Psychiatric: She has a normal mood and affect. Her behavior is normal.  The patient is calm, interactive, she does not appear to  be in distress, she does state that she is depressed because of the holiday coming up, she denies suicidality, she endorses feeling like she is having a miscarriage because of vaginal bleeding and states that she was upset by this.  She is not talking to anybody else in the room on the initial exam.  Nursing note and vitals reviewed.    ED Treatments / Results  Labs (all labs ordered are listed, but only abnormal results are displayed) Labs Reviewed  COMPREHENSIVE METABOLIC PANEL - Abnormal; Notable for the following components:      Result Value   Creatinine, Ser 1.21 (*)    GFR calc non Af Amer 46 (*)    GFR calc Af Amer 53 (*)    All other components within normal limits  CBC - Abnormal; Notable for the following components:   WBC 13.3 (*)    All other components within normal limits  CBG MONITORING, ED    EKG None  Radiology No results found.  Procedures Procedures (including critical care time)  Medications Ordered in ED Medications - No data to display   Initial Impression / Assessment and Plan / ED Course  I have reviewed the triage vital signs and the nursing notes.  Pertinent labs & imaging results that were available during my care of the patient were reviewed by me and considered in my medical decision making (see chart for details).  Clinical Course as of Mar 17 6  Fri Mar 16, 2018  1946 Depakote level is undetectable which gives further credence to the fact that the patient has not taken her medications.  The TSH is actually much improved from what it was 6 months ago.  She has been restarted on her home medications here this evening.  Psychiatry has seen the patient, awaiting placement   [BM]    Clinical Course User Index [BM] Noemi Chapel, MD   Review of the medical record shows that the patient last had an evaluation by behavioral health in August 2019, she had prior visits for similar in December 2018, November 2018, and April 2018.  There is no  corroborating information or family members at the bedside.  I was able to talk to the patient's sister on the phone, she states that she is unable to come to  the hospital because she does not have a ride and is disabled, she reports that the patient lives by herself, has schizophrenia, frequently runs out of her medications and does not go get her refills at which time she becomes worsened with her psychosis.  She reports they have not spoken in several months because of a falling out due to the patient's decompensated psychosis at that time.  She reports that the patient has had a couple of admissions to psychiatric care in the last year because of the same issue.  Depakote level, TSH added to the patient's work-up, at the minimum the patient appears to have a decompensated psychosis, potentially there are some medical issues as well though her hemoglobin is 13.  Will perform pelvic exam  Chaperone present for pelvic exam, the patient has a normal-appearing external genitalia, internal exam was deferred as the patient is not anemic and there was no sign of blood in her undergarments or at the vaginal introitus.  Breast exam was also performed, the patient has intertrigo below her bilateral breasts, the right side is worse than the left side with a wet read mucoid discharge.  She has a large hard lump in the right breast.  No obvious lymphadenopathy was palpated in the axilla.   At this time I feel comfortable consulting the psychiatry team, the patient is now talking to herself in the room and talking about her family members who are across the room, they are clearly not there.  Labs show no significant abnormalities, the patient is medically cleared to talk to psychiatry.  I have asked nursing to communicate to the accepting nurse at the patient's next facility regarding the patient's breast mass so that this can be followed up when she is psychiatrically stabilized.  Final Clinical Impressions(s)  / ED Diagnoses   Final diagnoses:  Acute psychosis ([place])  Candidal intertrigo  Breast mass in female    ED Discharge Orders    None       Noemi Chapel, MD 03/17/18 0008

## 2018-03-16 NOTE — ED Notes (Signed)
  Patient has been trying to climb out of the bed and has been pulling off her gown while in the hallway.  Patient has been redirected several times but seems agitated.  Staffing was called about a sitter but none were available.  Charge RN [place] notified.

## 2018-03-16 NOTE — Progress Notes (Addendum)
Pt accepted to [facility], main campus.  Dr. Jonelle Sports is the attending provider.   Call report to [phone] Mitzi Hansen @ [facility] ED notified.    Pt is voluntary and can be transported by [facility].  Pt is scheduled to arrive at [facility] at 8:30am on 03/17/18  Olive Bass, [place] Disposition [facility] BHH/TTS [phone]

## 2018-03-16 NOTE — BH Assessment (Addendum)
Tele Assessment Note   Patient Name: Dominique Acosta MRN: [id number] Referring Physician: Noemi Chapel, MD Location of Patient: [facility] Location of Provider: [place] Department  Dominique Acosta is a 67 y.o. female who presents voluntarily to [facility] via paramedics. Pt has a history of Schizoaffective Disorder.  Currently pt presents with multiple somatic complaints, including vaginal bleeding, abdominal pain, and concern she may be miscarrying a baby. Pt cooperative and gentle for assessment. She spoke softly, rambling and mumbled- it was often difficult to hear & understand her. Pt sometimes addressed this Probation officer when she spoke, but she also turned her head to both sides of her bed to engage in conversation with people who were not there. She appeared to make eye contact with them and muttered back and forth. Pt denies current SI. She admits recent feelings of Depression. Pt denied AVH, then looked at this writer & said "but I don't know"  before drifting into rambling. Pt's chart states current stressors includes a rift with her sister. Pt states she lives alone. Pt has limited insight and judgment. ?  Pt unable to provide her OP tx hx. When asked if she had a psychiatrist, she stated "No, but I need one"  IP history includes admission at [facility] in 2017, 2018, & 2019. Last admission was 07/19/2017 at [facility]. ? MSE: Pt is dressed in scrubs, quiet-awake, oriented x2  with normal speech and normal motor behavior. Eye contact is fair. Pt's mood is depressed, pleasant & anxious and affect is depressed and anxious. Affect is congruent with mood. Thought process is coherent and relevant. Pt was cooperative throughout assessment. Many questions could not be answered due to limitations related to pt's psychosis.   Disposition: Dominique Pickles, NP recommends psychiatric hospitalization   Diagnosis: F25.1 Schizoaffective disorder, depressive type  Past Medical History:  Past Medical History:  Diagnosis  Date  . Arm pain   . Chronic pain   . COPD (chronic obstructive pulmonary disease) ([place])   . Coronary artery disease   . Depression   . Dystonia   . Epilepsy ([place])   . High cholesterol   . Humerus fracture   . Hypothyroid   . Insomnia   . Leukemia ([place])   . Myocardial infarct ([place])   . Psychosis ([place])   . Schizophrenia ([place])   . Seizure [facility])     Past Surgical History:  Procedure Laterality Date  . BALLOON DILATION N/A 03/14/2017   Procedure: BALLOON DILATION;  Surgeon: Helayne Seminole, MD;  Location: [place];  Service: ENT;  Laterality: N/A;  . EXTERNAL EAR SURGERY    . HUMERUS FRACTURE SURGERY    . KENALOG INJECTION N/A 03/14/2017   Procedure: KENALOG INJECTION;  Surgeon: Helayne Seminole, MD;  Location: [place];  Service: ENT;  Laterality: N/A;  . MICROLARYNGOSCOPY WITH LASER N/A 03/14/2017   Procedure: MICROLARYNGOSCOPY WITH LASER;  Surgeon: Helayne Seminole, MD;  [facility];  Service: ENT;  Laterality: N/A;  . SHOULDER SURGERY      Family History:  Family History  Problem Relation Age of Onset  . Cancer Mother   . Cancer Father     Social History:  reports that she has quit smoking. Her smoking use included cigarettes. She has a 82.00 pack-year smoking history. She has never used smokeless tobacco. She reports that she does not drink alcohol or use drugs.  Additional Social History:  Alcohol / Drug Use Pain Medications: See MAR Prescriptions: See MAR Over the  Counter: See MAR History of alcohol / drug use?: No history of alcohol / drug abuse  CIWA: CIWA-Ar BP: (!) 161/78 Pulse Rate: 98 COWS:    Allergies:  Allergies  Allergen Reactions  . Amoxicillin Anaphylaxis and Other (See Comments)    Childhood allergy >> tolerated cephalosporins  Has patient had a PCN reaction causing immediate rash, facial/tongue/throat swelling, SOB or lightheadedness with hypotension: Yes Has patient had a PCN reaction causing severe rash involving mucus membranes or  skin necrosis: No Has patient had a PCN reaction that required hospitalization No Has patient had a PCN reaction occurring within the last 10 years: No If all of the above answers are "NO", then may proceed with Cephalosporin use.  Marland Kitchen Penicillins Anaphylaxis and Other (See Comments)    Childhood allergy Has patient had a PCN reaction causing immediate rash, facial/tongue/throat swelling, SOB or lightheadedness with hypotension: Yes Has patient had a PCN reaction causing severe rash involving mucus membranes or skin necrosis: No Has patient had a PCN reaction that required hospitalization No Has patient had a PCN reaction occurring within the last 10 years: No If all of the above answers are "NO", then may proceed with Cephalosporin use.   Marland Kitchen Phenytoin Other (See Comments)    Reaction:  CNS disorder   . Keflex [Cephalexin] Diarrhea  . Dilaudid [Hydromorphone Hcl] Other (See Comments)    Reaction:  Psychosis   . Haldol [Haloperidol] Other (See Comments)    Reaction:  Psychosis   . Morphine And Related Other (See Comments)    Reaction:  Psychosis     Home Medications:  (Not in a hospital admission)  OB/GYN Status:  No LMP recorded. Patient is postmenopausal.  General Assessment Data Location of Assessment: [facility] ED TTS Assessment: In system Is this a Tele or Face-to-Face Assessment?: Tele Assessment Is this an Initial Assessment or a Re-assessment for this encounter?: Initial Assessment Patient Accompanied by:: N/A Language Other than English: No Living Arrangements: Other (Comment) What gender do you identify as?: Female Marital status: Widowed(UTA) Pregnancy Status: No Living Arrangements: Alone Can pt return to current living arrangement?: Yes Admission Status: Voluntary Is patient capable of signing voluntary admission?: Yes Referral Source: MD Insurance type: united health care     Crisis Care Plan Living Arrangements: Alone Name of Psychiatrist: unknown(pt states "but  I need one") Name of Therapist: none reported  Education Status Is patient currently in school?: No  Risk to self with the past 6 months Suicidal Ideation: No Has patient been a risk to self within the past 6 months prior to admission? : (UTA) Suicidal Intent: (UTA) Has patient had any suicidal intent within the past 6 months prior to admission? : (UTA) Is patient at risk for suicide?: Yes Has patient had any suicidal plan within the past 6 months prior to admission? : Other (comment)(UTA) What has been your use of drugs/alcohol within the last 12 months?: UTA Previous Attempts/Gestures: No Triggers for Past Attempts: (UTA) Intentional Self Injurious Behavior: (UTA) Family Suicide History: Unable to assess Recent stressful life event(s): (UTA) Persecutory voices/beliefs?: No Depression: Yes Depression Symptoms: Despondent, Isolating(difficult to assess) Substance abuse history and/or treatment for substance abuse?: (UTA) Suicide prevention information given to non-admitted patients: Not applicable  Risk to Others within the past 6 months Homicidal Ideation: No Does patient have any lifetime risk of violence toward others beyond the six months prior to admission? : No  Psychosis Hallucinations: Auditory, Visual Delusions: Somatic     Cognitive Functioning Concentration: Decreased  Memory: Unable to Assess Is patient IDD: No Insight: Poor Impulse Control: Unable to Assess Appetite: Fair Have you had any weight changes? : (UTA) Sleep: Unable to Assess Vegetative Symptoms: Decreased grooming, Unable to Assess     Prior Inpatient Therapy Prior Inpatient Therapy: Yes Prior Therapy Dates: 2017, 2018, 2019 Prior Therapy Facilty/Provider(s): [facility] Reason for Treatment: Depression, psychosis  Prior Outpatient Therapy Prior Outpatient Therapy: Yes Prior Therapy Dates: (UTA) Prior Therapy Facilty/Provider(s): (UTA) Reason for Treatment: med management Does patient  have an ACCT team?: No Does patient have Intensive In-House Services?  : No Does patient have [facility] services? : Unknown Does patient have P4CC services?: No    Mental Status Report Appearance/Hygiene: Disheveled, In scrubs Eye Contact: Poor Motor Activity: Restlessness Speech: Logical/coherent, Incoherent, Soft Level of Consciousness: Quiet/awake Mood: Depressed Affect: Anxious, Constricted, Depressed Anxiety Level: Moderate Thought Processes: Irrelevant, Relevant Judgement: Impaired Orientation: Person, Place Obsessive Compulsive Thoughts/Behaviors: None             Advance Directives (For Healthcare) Does Patient Have a Medical Advance Directive?: No Would patient like information on creating a medical advance directive?: No - Patient declined          Disposition: Dominique Pickles, NP recommends psychiatric hospitalization Disposition Initial Assessment Completed for this Encounter: Yes Disposition of Patient: Admit  This service was provided via telemedicine using a 2-way, interactive audio and video technology.  Names of all persons participating in this telemedicine service and their role in this encounter.   Promise Bushong H Randle Shatzer 03/16/2018 6:14 PM

## 2018-03-16 NOTE — ED Notes (Signed)
TTS at bedside. 

## 2018-03-16 NOTE — Progress Notes (Signed)
Pt meets inpatient criteria per Marvia Pickles, NP. Referral information has been sent to the following hospitals for review: [facility]  [place]  [facility]  [facility]   Disposition will continue to assist with inpatient placement needs.   Audree Camel, LCSW, [place] Disposition [facility] BHH/TTS [phone]

## 2018-03-17 NOTE — ED Notes (Signed)
Patient sitting up in bed, eating breakfast. Has no complaints at this time. Informed her of plan of care - awaiting [facility] for transport.

## 2018-03-17 NOTE — ED Notes (Addendum)
[facility] Admissions staff notified of [facility] transport arrival for pt. This RN asked that message be relayed to Rod Holler, Therapist, sports. Pt placed in paper scrubs prior to transport.

## 2018-03-17 NOTE — ED Notes (Addendum)
Voicemail left for [employer] to transport patient to [facility].

## 2018-03-17 NOTE — ED Notes (Addendum)
RN confirmed with EDP that Transfer paperwork was completed in full.

## 2018-03-17 NOTE — ED Notes (Signed)
Ordered diet tray for pt  

## 2018-03-17 NOTE — Discharge Instructions (Addendum)
Please be sure to proceed to [facility], as discussed. In addition, please be sure to follow-up with your physician.

## 2018-04-17 ENCOUNTER — Other Ambulatory Visit: Payer: Self-pay | Admitting: Family

## 2018-04-17 ENCOUNTER — Other Ambulatory Visit: Payer: Self-pay | Admitting: Internal Medicine

## 2018-04-17 DIAGNOSIS — N644 Mastodynia: Secondary | ICD-10-CM

## 2018-04-17 DIAGNOSIS — N6310 Unspecified lump in the right breast, unspecified quadrant: Secondary | ICD-10-CM

## 2018-04-20 ENCOUNTER — Ambulatory Visit (HOSPITAL_COMMUNITY)
Admission: EM | Admit: 2018-04-20 | Discharge: 2018-04-20 | Disposition: A | Discharge status: Home / Self Care | Payer: Medicare Other | Attending: Family Medicine | Admitting: Family Medicine

## 2018-04-20 ENCOUNTER — Encounter (HOSPITAL_COMMUNITY): Payer: Self-pay | Admitting: Emergency Medicine

## 2018-04-20 DIAGNOSIS — B029 Zoster without complications: Secondary | ICD-10-CM

## 2018-04-20 DIAGNOSIS — B356 Tinea cruris: Secondary | ICD-10-CM

## 2018-04-20 MED ORDER — KETOCONAZOLE 2 % EX CREA: 1.0000 "application " | TOPICAL_CREAM | Freq: Two times a day (BID) | CUTANEOUS | 1 refills | Status: AC

## 2018-04-20 MED ORDER — VALACYCLOVIR HCL 1 G PO TABS: 1000.0000 mg | ORAL_TABLET | Freq: Three times a day (TID) | ORAL | 0 refills | Status: AC

## 2018-04-20 NOTE — ED Triage Notes (Signed)
Pt c/o lump in her R breast, states its been there for a year and recently started hurting, states she has a mammogram in a week. Pt also c/o rash on her back, worried about shingles.

## 2018-04-20 NOTE — ED Provider Notes (Signed)
[facility]    CSN: [id number] Arrival date & time: 04/20/18  1119     History   Chief Complaint Chief Complaint  Patient presents with  . Breast Pain  . Rash    HPI Dominique Acosta is a 68 y.o. female.   This is a 68 year old established patient at [facility] urgent care.  Pt c/o lump in her R breast, states its been there for a year and recently started hurting, states she has a mammogram in a week. Pt also c/o rash on her back, worried about shingles.    Note from prior visit to ED in December 2019: The patient is a 68 year old female, she has known history of COPD, epilepsy as well as psychosis and schizophrenia, she has had a prior history of seizures, myocardial infarction and a reported history of leukemia.  She presents to the hospital today with a complaint of altered mental status the patient is not able to give me very much in the way of information but at the age of 16 today she tells me that she thought she was having a baby and miscarrying earlier today because of vaginal bleeding.  She is evidently coming by paramedics from a home, she states that she has had both vaginal and rectal bleeding for approximately 9 days, according to the paramedic she was apparently responding to both auditory and visual hallucinations.  When I asked the patient what is the complaint she states that she hurts all over and is having vaginal bleeding, when I try to clarify what hurts she says you know that thing, when I asked specifically if things hurt she endorses pain everywhere I asked.  She denies trauma.  Level 5 caveat applies secondary to psychosis     Past Medical History:  Diagnosis Date  . Arm pain   . Chronic pain   . COPD (chronic obstructive pulmonary disease) ([place])   . Coronary artery disease   . Depression   . Dystonia   . Epilepsy ([place])   . High cholesterol   . Humerus fracture   . Hypothyroid   . Insomnia   . Leukemia ([place])   . Myocardial infarct ([place])   .  Psychosis ([place])   . Schizophrenia ([place])   . Seizure [facility])     Patient Active Problem List   Diagnosis Date Noted  . Hypothyroidism due to acquired atrophy of thyroid 03/21/2017  . Dystonia 03/21/2017  . Dyslipidemia 03/21/2017  . COPD (chronic obstructive pulmonary disease) ([place]) 03/21/2017  . 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 MRSA pneumonia ([place]) 03/11/2017  . Acute respiratory failure with hypoxia ([place])   . Acute encephalopathy 02/22/2017  . Gastroesophageal reflux disease without esophagitis 09/11/2016  . Insomnia 08/26/2016  . Seizure disorder ([place]) 08/26/2016  . Humerus fracture 08/18/2016  . Acute metabolic encephalopathy 56/21/3086  . Acute lower UTI 08/03/2016  . Hypokalemia 08/03/2016  . Leukocytosis 08/03/2016  . Benign essential HTN 08/03/2016  . Schizoaffective disorder, bipolar type ([place]) 07/08/2016  . Chronic constipation 09/02/2010    Past Surgical History:  Procedure Laterality Date  . BALLOON DILATION N/A 03/14/2017   Procedure: BALLOON DILATION;  Surgeon: Helayne Seminole, MD;  Location: [place];  Service: ENT;  Laterality: N/A;  . EXTERNAL EAR SURGERY    . HUMERUS FRACTURE SURGERY    . KENALOG INJECTION N/A 03/14/2017   Procedure: KENALOG INJECTION;  Surgeon: Helayne Seminole, MD;  Location: [place];  Service: ENT;  Laterality: N/A;  . MICROLARYNGOSCOPY WITH  LASER N/A 03/14/2017   Procedure: MICROLARYNGOSCOPY WITH LASER;  Surgeon: Helayne Seminole, MD;  Location: [place];  Service: ENT;  Laterality: N/A;  . SHOULDER SURGERY      OB History   No obstetric history on file.      Home Medications    Prior to Admission medications   Medication Sig Start Date End Date Taking? Authorizing Provider  ARIPiprazole (ABILIFY) 10 MG tablet Take 10 mg by mouth 2 (two) times daily.  06/29/17   [provider]  ARIPiprazole (ABILIFY) 20 MG tablet Place 1 tablet (20 mg total) into feeding tube daily. 03/18/17   Raiford Noble Latif, DO  aspirin EC 81 MG  tablet Take 81 mg by mouth daily.    [provider]  benztropine (COGENTIN) 2 MG tablet Take 1 tablet (2 mg total) by mouth 3 (three) times daily. 01/26/17   [name], MD  divalproex (DEPAKOTE) 250 MG DR tablet Take 1 tablet (250 mg total) by mouth every 8 (eight) hours. 03/17/17   Raiford Noble Latif, DO  folic acid (FOLVITE) 1 MG tablet Place 1 tablet (1 mg total) into feeding tube daily. 03/18/17   Sheikh, Omair Latif, DO  ipratropium-albuterol (DUONEB) 0.5-2.5 (3) MG/3ML SOLN Take 3 mLs by nebulization 2 (two) times daily. 03/17/17   Raiford Noble Latif, DO  ketoconazole (NIZORAL) 2 % cream Apply 1 application topically 2 (two) times daily. 04/20/18   Robyn Haber, MD  levETIRAcetam (KEPPRA) 1000 MG tablet Take 1 tablet (1,000 mg total) by mouth 2 (two) times daily. 03/17/17   Raiford Noble Latif, DO  levothyroxine (SYNTHROID, LEVOTHROID) 100 MCG tablet Take 1 tablet (100 mcg total) by mouth daily before breakfast. 03/18/17   Raiford Noble Latif, DO  omeprazole (PRILOSEC) 20 MG capsule Take 20 mg by mouth daily.  06/09/17   [provider]  QUEtiapine (SEROQUEL) 25 MG tablet Take 25 mg by mouth at bedtime as needed (Sleep).  05/12/17   [provider]  valACYclovir (VALTREX) 1000 MG tablet Take 1 tablet (1,000 mg total) by mouth 3 (three) times daily for 14 days. 04/20/18 05/04/18  Robyn Haber, MD  venlafaxine XR (EFFEXOR-XR) 150 MG 24 hr capsule Take 150 mg by mouth 2 (two) times daily.     [provider]    Family History Family History  Problem Relation Age of Onset  . Cancer Mother   . Cancer Father     Social History Social History   Tobacco Use  . Smoking status: Former Smoker    Packs/day: 2.00    Years: 41.00    Pack years: 82.00    Types: Cigarettes  . Smokeless tobacco: Never Used  Substance Use Topics  . Alcohol use: No  . Drug use: No     Allergies   Amoxicillin; Penicillins; Phenytoin; Keflex [cephalexin]; Dilaudid  [hydromorphone hcl]; Haldol [haloperidol]; and Morphine and related   Review of Systems Review of Systems   Physical Exam Triage Vital Signs ED Triage Vitals [04/20/18 1203]  Enc Vitals Group     BP (!) 122/56     Pulse Rate (!) 111     Resp 18     Temp 98 F (36.7 C)     Temp src      SpO2 100 %     Weight      Height      Head Circumference      Peak Flow      Pain Score 8  Pain Loc      Pain Edu?      Excl. in [place]?    No data found.  Updated Vital Signs BP (!) 122/56   Pulse (!) 111   Temp 98 F (36.7 C)   Resp 18   SpO2 100%    Physical Exam Vitals signs and nursing note reviewed.  Constitutional:      General: She is not in acute distress.    Appearance: Normal appearance. She is obese. She is not ill-appearing or toxic-appearing.  HENT:     Head: Normocephalic.     Mouth/Throat:     Mouth: Mucous membranes are moist.     Pharynx: Oropharynx is clear.  Eyes:     Conjunctiva/sclera: Conjunctivae normal.  Neck:     Musculoskeletal: Normal range of motion and neck supple.  Cardiovascular:     Rate and Rhythm: Tachycardia present.  Pulmonary:     Effort: Pulmonary effort is normal.  Musculoskeletal: Normal range of motion.  Neurological:     Mental Status: She is alert.    Vesicular rash over right back.  Macerated inframammary erythema that is confluent and well demarcated.    UC Treatments / Results  Labs (all labs ordered are listed, but only abnormal results are displayed) Labs Reviewed - No data to display  EKG None  Radiology No results found.  Procedures Procedures (including critical care time)  Medications Ordered in UC Medications - No data to display  Initial Impression / Assessment and Plan / UC Course  I have reviewed the triage vital signs and the nursing notes.  Pertinent labs & imaging results that were available during my care of the patient were reviewed by me and considered in my medical decision making (see  chart for details).    Final Clinical Impressions(s) / UC Diagnoses   Final diagnoses:  Tinea cruris  Herpes zoster without complication   Discharge Instructions   None    ED Prescriptions    Medication Sig Dispense Auth. Provider   valACYclovir (VALTREX) 1000 MG tablet Take 1 tablet (1,000 mg total) by mouth 3 (three) times daily for 14 days. 21 tablet Robyn Haber, MD   ketoconazole (NIZORAL) 2 % cream Apply 1 application topically 2 (two) times daily. 60 g Robyn Haber, MD     Controlled Substance Prescriptions [place] Controlled Substance Registry consulted? No   Robyn Haber, MD 04/20/18 1223

## 2018-04-24 ENCOUNTER — Other Ambulatory Visit: Payer: Self-pay

## 2018-06-07 ENCOUNTER — Emergency Department (HOSPITAL_COMMUNITY)
Admission: EM | Admit: 2018-06-07 | Discharge: 2018-06-07 | Disposition: A | Discharge status: Home / Self Care | Payer: Medicare Other | Attending: Emergency Medicine | Admitting: Emergency Medicine

## 2018-06-07 ENCOUNTER — Encounter (HOSPITAL_COMMUNITY): Payer: Self-pay | Admitting: Obstetrics and Gynecology

## 2018-06-07 ENCOUNTER — Other Ambulatory Visit: Payer: Self-pay

## 2018-06-07 ENCOUNTER — Emergency Department (HOSPITAL_COMMUNITY): Payer: Medicare Other

## 2018-06-07 DIAGNOSIS — J449 Chronic obstructive pulmonary disease, unspecified: Secondary | ICD-10-CM | POA: Diagnosis not present

## 2018-06-07 DIAGNOSIS — Z79899 Other long term (current) drug therapy: Secondary | ICD-10-CM | POA: Insufficient documentation

## 2018-06-07 DIAGNOSIS — Z87891 Personal history of nicotine dependence: Secondary | ICD-10-CM | POA: Diagnosis not present

## 2018-06-07 DIAGNOSIS — R110 Nausea: Secondary | ICD-10-CM | POA: Insufficient documentation

## 2018-06-07 DIAGNOSIS — F329 Major depressive disorder, single episode, unspecified: Secondary | ICD-10-CM | POA: Diagnosis not present

## 2018-06-07 DIAGNOSIS — I252 Old myocardial infarction: Secondary | ICD-10-CM | POA: Diagnosis not present

## 2018-06-07 DIAGNOSIS — Z7982 Long term (current) use of aspirin: Secondary | ICD-10-CM | POA: Insufficient documentation

## 2018-06-07 DIAGNOSIS — E039 Hypothyroidism, unspecified: Secondary | ICD-10-CM | POA: Diagnosis not present

## 2018-06-07 DIAGNOSIS — F209 Schizophrenia, unspecified: Secondary | ICD-10-CM | POA: Diagnosis not present

## 2018-06-07 DIAGNOSIS — I10 Essential (primary) hypertension: Secondary | ICD-10-CM | POA: Insufficient documentation

## 2018-06-07 DIAGNOSIS — I2510 Atherosclerotic heart disease of native coronary artery without angina pectoris: Secondary | ICD-10-CM | POA: Insufficient documentation

## 2018-06-07 DIAGNOSIS — R112 Nausea with vomiting, unspecified: Secondary | ICD-10-CM | POA: Diagnosis present

## 2018-06-07 LAB — COMPREHENSIVE METABOLIC PANEL
ALBUMIN: 4.2 g/dL (ref 3.5–5.0)
ALT: 16 U/L (ref 0–44)
AST: 21 U/L (ref 15–41)
Alkaline Phosphatase: 66 U/L (ref 38–126)
Anion gap: 9 (ref 5–15)
BUN: 17 mg/dL (ref 8–23)
CALCIUM: 9 mg/dL (ref 8.9–10.3)
CO2: 30 mmol/L (ref 22–32)
Chloride: 98 mmol/L (ref 98–111)
Creatinine, Ser: 0.8 mg/dL (ref 0.44–1.00)
GFR calc Af Amer: >60 mL/min (ref >60)
GFR calc non Af Amer: >60 mL/min (ref >60)
Glucose, Bld: 96 mg/dL (ref 70–99)
Potassium: 3.4 mmol/L — ABNORMAL LOW (ref 3.5–5.1)
SODIUM: 137 mmol/L (ref 135–145)
Total Bilirubin: 0.7 mg/dL (ref 0.3–1.2)
Total Protein: 7.4 g/dL (ref 6.5–8.1)

## 2018-06-07 LAB — CBC
HCT: 46.5 % — ABNORMAL HIGH (ref 36.0–46.0)
Hemoglobin: 14.3 g/dL (ref 12.0–15.0)
MCH: 30.6 pg (ref 26.0–34.0)
MCHC: 30.8 g/dL (ref 30.0–36.0)
MCV: 99.6 fL (ref 80.0–100.0)
PLATELETS: 255 10*3/uL (ref 150–400)
RBC: 4.67 MIL/uL (ref 3.87–5.11)
RDW: 14.1 % (ref 11.5–15.5)
WBC: 11.8 10*3/uL — ABNORMAL HIGH (ref 4.0–10.5)
nRBC: 0 % (ref 0.0–0.2)

## 2018-06-07 LAB — LIPASE, BLOOD: LIPASE: 32 U/L (ref 11–51)

## 2018-06-07 MED ORDER — PROMETHAZINE HCL 25 MG PO TABS: 25.0000 mg | ORAL_TABLET | Freq: Four times a day (QID) | ORAL | 0 refills | Status: AC | PRN

## 2018-06-07 MED ORDER — PROMETHAZINE HCL 25 MG PO TABS
25.0000 mg | ORAL_TABLET | Freq: Once | ORAL | Status: AC
  Administered 2018-06-07: 25 mg via ORAL
  Filled 2018-06-07: qty 1

## 2018-06-07 MED ORDER — SODIUM CHLORIDE 0.9% FLUSH: 3.0000 mL | Freq: Once | INTRAVENOUS | Status: DC

## 2018-06-07 MED ORDER — ONDANSETRON 4 MG PO TBDP
4.0000 mg | ORAL_TABLET | Freq: Once | ORAL | Status: AC
  Administered 2018-06-07: 4 mg via ORAL
  Filled 2018-06-07: qty 1

## 2018-06-07 MED ORDER — ONDANSETRON HCL 4 MG/2ML IJ SOLN
4.0000 mg | Freq: Once | INTRAMUSCULAR | Status: DC
  Filled 2018-06-07: qty 2

## 2018-06-07 MED ORDER — SODIUM CHLORIDE 0.9 % IV SOLN
1000.0000 mL | INTRAVENOUS | Status: DC
  Administered 2018-06-07: 1000 mL via INTRAVENOUS

## 2018-06-07 MED ORDER — SODIUM CHLORIDE 0.9 % IV BOLUS (SEPSIS): 1000.0000 mL | Freq: Once | INTRAVENOUS | Status: DC

## 2018-06-07 NOTE — Discharge Instructions (Signed)
Follow-up with a primary care doctor, consider seeing a GI doctor for further evaluation, take the medications as needed for nausea

## 2018-06-07 NOTE — ED Notes (Signed)
ED Provider at bedside. 

## 2018-06-07 NOTE — ED Triage Notes (Signed)
Patient is coming from home with multiple complaints. Pt reports she has had abdominal pain, Nausea, emesis, back pain, and a lump in her breast. Pt reports she "quit the doctor" because she was "not getting the help she was begging for" Pt reports she "was in a coma for 3 months" and needs help.

## 2018-06-07 NOTE — ED Notes (Signed)
RN has stuck patient x4 without successful IV.

## 2018-06-07 NOTE — ED Notes (Signed)
Patient given a cab voucher by [employer] [place] and assisted into taxi by [employer]

## 2018-06-07 NOTE — ED Notes (Signed)
Patient is complaining to RN about her nausea and stating that the Zofran made her more nauseas and she believes she is allergic. RN explained to patient that she does not think patient is allergic to Zofran. Patient has asked RN to throw away multiple emesis bags that were completely empty. Patient became frustrated with RN and began verbalizing that her arm was broken. RN explained to patient that her arm was not broken, but has been broken in the past.  Patient arguing with this RN about "technicality"

## 2018-06-07 NOTE — ED Provider Notes (Signed)
[facility] DEPT Provider Note   CSN: [id number] Arrival date & time: 06/07/18  1237    History   Chief Complaint Chief Complaint  Patient presents with  . Nausea  . Emesis  . Breast Mass  . Back Pain  . Abdominal Pain    HPI Dominique Acosta is a 68 y.o. female.     HPI Pt presents to the ED with several complaints but most of these are chronic.  She has chronic arm pain associated with an old injury.  She has a breast mass and is supposed to get a mammogram.  She was recently diagnosed with shingles and had to cancel her mammogram. Pt also states her prescriptions are running out.  In the last couple of weeks though she developed nausea and vomiting.  Pt states she has been vomiting off and on.   This morning she vomited but still feels nauseated.  No diarrhea. SHe does feel constipated.   She is having diffuse abdominal cramping.  No trouble urinating.   Past Medical History:  Diagnosis Date  . Arm pain   . Chronic pain   . COPD (chronic obstructive pulmonary disease) ([place])   . Coronary artery disease   . Depression   . Dystonia   . Epilepsy ([place])   . High cholesterol   . Humerus fracture   . Hypothyroid   . Insomnia   . Leukemia ([place])   . Myocardial infarct ([place])   . Psychosis ([place])   . Schizophrenia ([place])   . Seizure [facility])     Patient Active Problem List   Diagnosis Date Noted  . Hypothyroidism due to acquired atrophy of thyroid 03/21/2017  . Dystonia 03/21/2017  . Dyslipidemia 03/21/2017  . COPD (chronic obstructive pulmonary disease) ([place]) 03/21/2017  . 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 MRSA pneumonia ([place]) 03/11/2017  . Acute respiratory failure with hypoxia ([place])   . Acute encephalopathy 02/22/2017  . Gastroesophageal reflux disease without esophagitis 09/11/2016  . Insomnia 08/26/2016  . Seizure disorder ([place]) 08/26/2016  . Humerus fracture 08/18/2016  . Acute metabolic encephalopathy 19/62/2297  . Acute lower UTI  08/03/2016  . Hypokalemia 08/03/2016  . Leukocytosis 08/03/2016  . Benign essential HTN 08/03/2016  . Schizoaffective disorder, bipolar type ([place]) 07/08/2016  . Chronic constipation 09/02/2010    Past Surgical History:  Procedure Laterality Date  . BALLOON DILATION N/A 03/14/2017   Procedure: BALLOON DILATION;  Surgeon: Helayne Seminole, MD;  Location: [place];  Service: ENT;  Laterality: N/A;  . EXTERNAL EAR SURGERY    . HUMERUS FRACTURE SURGERY    . KENALOG INJECTION N/A 03/14/2017   Procedure: KENALOG INJECTION;  Surgeon: Helayne Seminole, MD;  Location: [place];  Service: ENT;  Laterality: N/A;  . MICROLARYNGOSCOPY WITH LASER N/A 03/14/2017   Procedure: MICROLARYNGOSCOPY WITH LASER;  Surgeon: Helayne Seminole, MD;  Location: [place];  Service: ENT;  Laterality: N/A;  . SHOULDER SURGERY       OB History   No obstetric history on file.      Home Medications    Prior to Admission medications   Medication Sig Start Date End Date Taking? Authorizing Provider  baclofen (LIORESAL) 10 MG tablet Take 10 mg by mouth 3 (three) times daily.   Yes [provider]  divalproex (DEPAKOTE) 250 MG DR tablet Take 1 tablet (250 mg total) by mouth every 8 (eight) hours. 03/17/17  Yes Sheikh, Omair Latif, DO  Melatonin 5 MG TABS Take 5 mg  by mouth at bedtime as needed (sleep).   Yes [provider]  ARIPiprazole (ABILIFY) 20 MG tablet Place 1 tablet (20 mg total) into feeding tube daily. 03/18/17   Raiford Noble Latif, DO  aspirin EC 81 MG tablet Take 81 mg by mouth daily.    [provider]  benztropine (COGENTIN) 2 MG tablet Take 1 tablet (2 mg total) by mouth 3 (three) times daily. Patient not taking: Reported on 06/07/2018 01/26/17   [name], MD  folic acid (FOLVITE) 1 MG tablet Place 1 tablet (1 mg total) into feeding tube daily. 03/18/17   Sheikh, Omair Latif, DO  ipratropium-albuterol (DUONEB) 0.5-2.5 (3) MG/3ML SOLN Take 3 mLs by nebulization 2 (two)  times daily. 03/17/17   Raiford Noble Latif, DO  ketoconazole (NIZORAL) 2 % cream Apply 1 application topically 2 (two) times daily. 04/20/18   Robyn Haber, MD  levETIRAcetam (KEPPRA) 1000 MG tablet Take 1 tablet (1,000 mg total) by mouth 2 (two) times daily. 03/17/17   Raiford Noble Latif, DO  levothyroxine (SYNTHROID, LEVOTHROID) 100 MCG tablet Take 1 tablet (100 mcg total) by mouth daily before breakfast. 03/18/17   Raiford Noble Latif, DO  omeprazole (PRILOSEC) 20 MG capsule Take 20 mg by mouth daily.  06/09/17   [provider]  promethazine (PHENERGAN) 25 MG tablet Take 1 tablet (25 mg total) by mouth every 6 (six) hours as needed for up to 12 doses for nausea or vomiting. 06/07/18   Dorie Rank, MD  venlafaxine XR (EFFEXOR-XR) 150 MG 24 hr capsule Take 150 mg by mouth 2 (two) times daily.     [provider]    Family History Family History  Problem Relation Age of Onset  . Cancer Mother   . Cancer Father     Social History Social History   Tobacco Use  . Smoking status: Former Smoker    Packs/day: 2.00    Years: 41.00    Pack years: 82.00    Types: Cigarettes  . Smokeless tobacco: Never Used  Substance Use Topics  . Alcohol use: No  . Drug use: No     Allergies   Amoxicillin; Penicillins; Phenytoin; Keflex [cephalexin]; Dilaudid [hydromorphone hcl]; Haldol [haloperidol]; and Morphine and related   Review of Systems Review of Systems  All other systems reviewed and are negative.    Physical Exam Updated Vital Signs BP 124/64 (BP Location: Right Arm)   Pulse 81   Temp 98 F (36.7 C) (Oral)   Resp 18   SpO2 97%   Physical Exam Vitals signs and nursing note reviewed.  Constitutional:      General: She is not in acute distress.    Appearance: She is well-developed. She is not ill-appearing.  HENT:     Head: Normocephalic and atraumatic.     Right Ear: External ear normal.     Left Ear: External ear normal.  Eyes:     General: No scleral  icterus.       Right eye: No discharge.        Left eye: No discharge.     Conjunctiva/sclera: Conjunctivae normal.  Neck:     Musculoskeletal: Neck supple.     Trachea: No tracheal deviation.  Cardiovascular:     Rate and Rhythm: Normal rate and regular rhythm.  Pulmonary:     Effort: Pulmonary effort is normal. No respiratory distress.     Breath sounds: Normal breath sounds. No stridor. No wheezing or rales.  Abdominal:  General: Bowel sounds are normal. There is no distension.     Palpations: Abdomen is soft.     Tenderness: There is generalized abdominal tenderness. There is no guarding or rebound.     Hernia: No hernia is present.     Comments: Pt indicated she had to vomit, dry heaved, ?self induced retching, no emesis noted  Musculoskeletal:        General: No tenderness.  Skin:    General: Skin is warm and dry.     Findings: No rash.     Comments: Slight rash on midthoracic back, no vesicles, ulcer or scabs, improved compared to prior images in her electronic medical record  Neurological:     Mental Status: She is alert.     Cranial Nerves: No cranial nerve deficit (no facial droop, extraocular movements intact, no slurred speech).     Sensory: No sensory deficit.     Motor: No abnormal muscle tone or seizure activity.     Coordination: Coordination normal.      ED Treatments / Results  Labs (all labs ordered are listed, but only abnormal results are displayed) Labs Reviewed  COMPREHENSIVE METABOLIC PANEL - Abnormal; Notable for the following components:      Result Value   Potassium 3.4 (*)    All other components within normal limits  CBC - Abnormal; Notable for the following components:   WBC 11.8 (*)    HCT 46.5 (*)    All other components within normal limits  LIPASE, BLOOD  URINALYSIS, ROUTINE W REFLEX MICROSCOPIC    EKG None  Radiology Dg Abdomen Acute W/chest  Result Date: 06/07/2018 CLINICAL DATA:  Abdominal pain, vomiting. EXAM: DG ABDOMEN  ACUTE W/ 1V CHEST COMPARISON:  Radiographs of February 22, 2017. FINDINGS: There is no evidence of dilated bowel loops or free intraperitoneal air. No radiopaque calculi or other significant radiographic abnormality is seen. Heart size and mediastinal contours are within normal limits. Both lungs are clear. IMPRESSION: No evidence of bowel obstruction or ileus. No acute cardiopulmonary disease. Electronically Signed   By: Marijo Conception, M.D.   On: 06/07/2018 15:23    Procedures Procedures (including critical care time)  Medications Ordered in ED Medications  sodium chloride flush (NS) 0.9 % injection 3 mL (3 mLs Intravenous Not Given 06/07/18 1446)  sodium chloride 0.9 % bolus 1,000 mL (0 mLs Intravenous Hold 06/07/18 1447)    Followed by  0.9 %  sodium chloride infusion (1,000 mLs Intravenous New Bag/Given 06/07/18 1512)  promethazine (PHENERGAN) tablet 25 mg (has no administration in time range)  ondansetron (ZOFRAN-ODT) disintegrating tablet 4 mg (4 mg Oral Given 06/07/18 1446)     Initial Impression / Assessment and Plan / ED Course  I have reviewed the triage vital signs and the nursing notes.  Pertinent labs & imaging results that were available during my care of the patient were reviewed by me and considered in my medical decision making (see chart for details).  Clinical Course as of Jun 07 1601  Thu Jun 07, 2018  1602 Labs reviewed.  Slight increase in white blood cell count but I doubt this is clinically significant.  Electrolyte panel does not show any signs of significant dehydration   [JK]    Clinical Course User Index [JK] Dorie Rank, MD      Patient presented to ED for evaluation of several complaints.  Most of these are chronic including chronic arm pain associated with an old fracture as well as  a breast mass that she has been scheduled for outpatient imaging.  Patient does not have any signs of acute infection on exam.  Her abdominal exam is benign.  Patient has had  intermittent retching here in the ED but has not had any actual emesis.  She does not appear dehydrated.  I will give her prescription for an antiemetic.  Recommend outpatient follow-up.  Final Clinical Impressions(s) / ED Diagnoses   Final diagnoses:  Nausea    ED Discharge Orders         Ordered    promethazine (PHENERGAN) 25 MG tablet  Every 6 hours PRN     06/07/18 1544           Dorie Rank, MD 06/07/18 1607

## 2018-06-07 NOTE — ED Notes (Signed)
RN attempted to call patient's emergency contacts x3

## 2018-06-07 NOTE — ED Triage Notes (Signed)
Per EMS-coming from home-N/V for 2 weeks-has not seen a PCP-chills-flu like symptoms-no vomiting with EMS

## 2018-06-15 ENCOUNTER — Other Ambulatory Visit: Payer: Self-pay

## 2018-06-20 ENCOUNTER — Ambulatory Visit
Admission: RE | Admit: 2018-06-20 | Discharge: 2018-06-20 | Disposition: A | Discharge status: Home / Self Care | Payer: Medicare Other | Source: Ambulatory Visit | Attending: Family | Admitting: Family

## 2018-06-20 ENCOUNTER — Other Ambulatory Visit: Payer: Self-pay

## 2018-06-20 DIAGNOSIS — N6310 Unspecified lump in the right breast, unspecified quadrant: Secondary | ICD-10-CM

## 2018-06-20 DIAGNOSIS — N644 Mastodynia: Secondary | ICD-10-CM

## 2018-06-25 ENCOUNTER — Other Ambulatory Visit: Payer: Self-pay | Admitting: Surgery

## 2018-06-28 ENCOUNTER — Telehealth: Payer: Self-pay | Admitting: *Deleted

## 2018-06-28 NOTE — Telephone Encounter (Signed)
Referral fax placed in scheduling box, [facility] Coralie Keens, MD

## 2018-07-05 IMAGING — DX DG CHEST 1V PORT
1 series · 1 of 1 positions shown · non-contrast
Comparison: 03/02/2017.  03/01/2017.

CLINICAL DATA: Shortness of breath.

EXAM:
PORTABLE CHEST 1 VIEW

[chest]
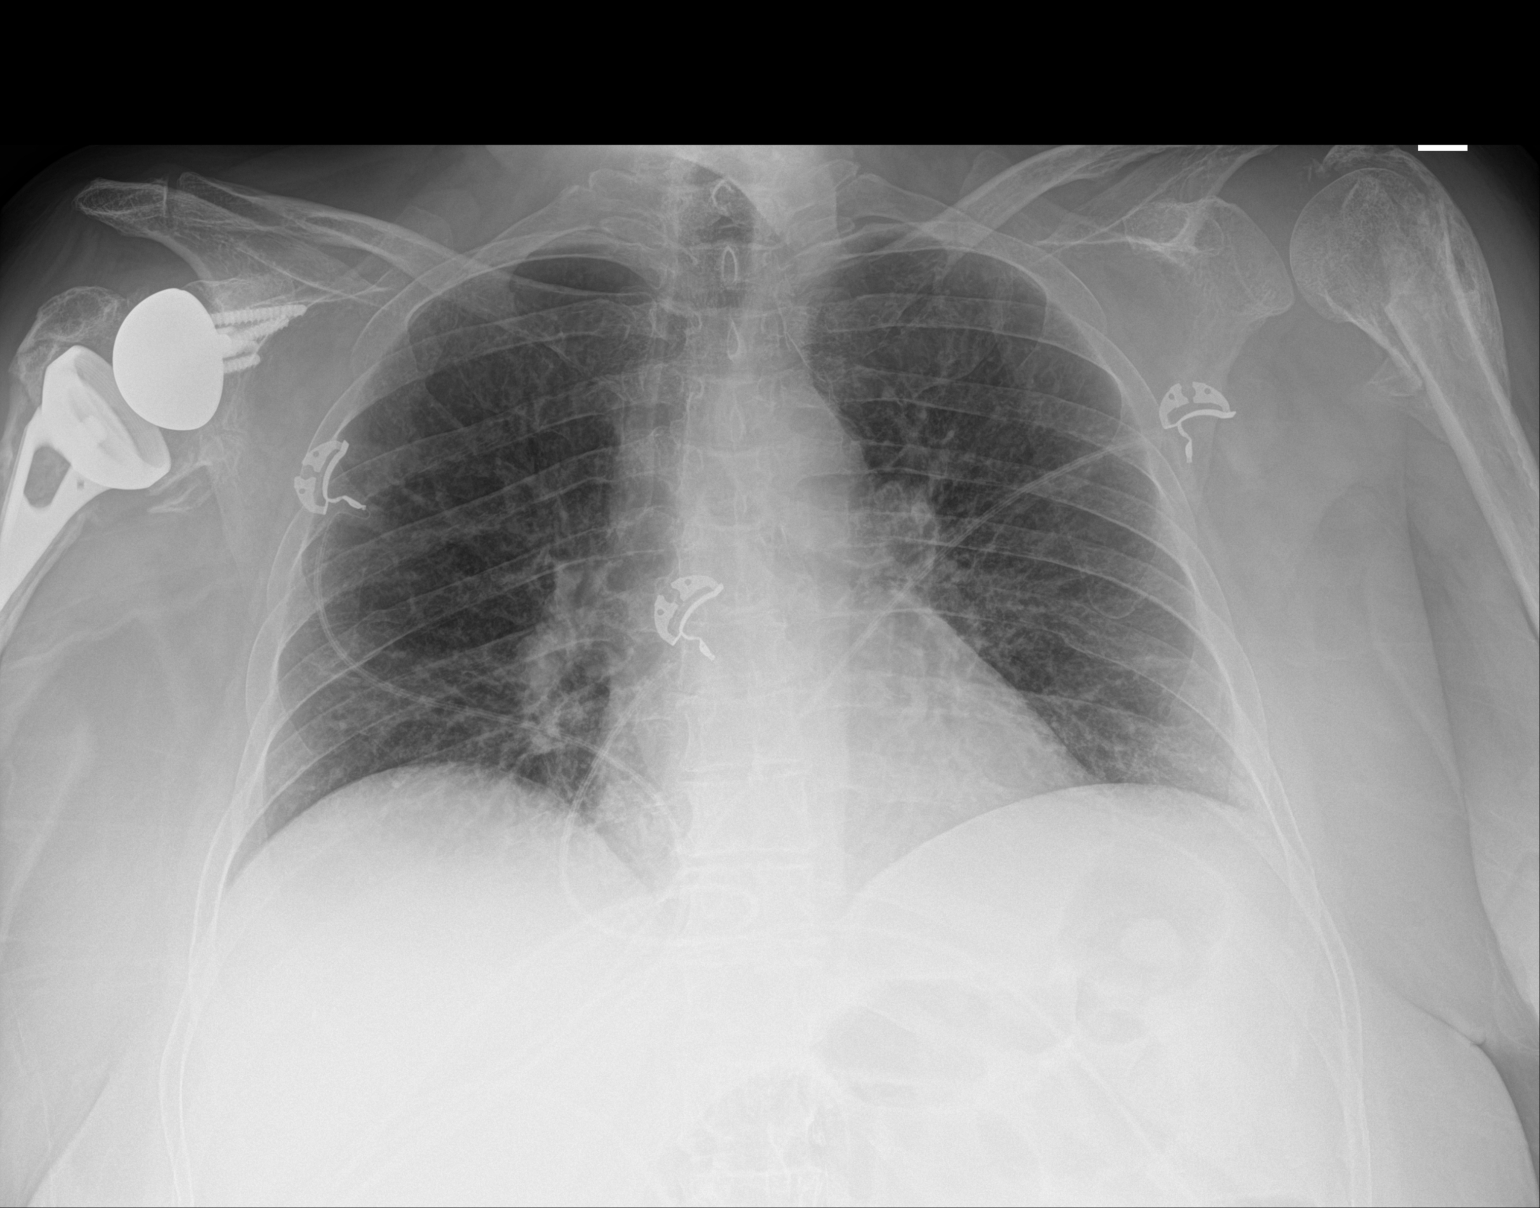

[1 of 1 positions shown; findings below may reference images not displayed]

FINDINGS: Interim removal of lines and tubes. Mediastinum and hilar structures
normal. Heart size normal. Mild basilar atelectasis. No pleural
effusion or pneumothorax. Prior right shoulder replacement .
IMPRESSION: 1.  Interim removal of lines and tubes.

2. Low lung volumes with mild basilar atelectasis, chest is
otherwise unremarkable .

## 2018-07-30 ENCOUNTER — Other Ambulatory Visit: Payer: Self-pay | Admitting: Family

## 2018-07-30 DIAGNOSIS — N6310 Unspecified lump in the right breast, unspecified quadrant: Secondary | ICD-10-CM

## 2018-08-10 ENCOUNTER — Inpatient Hospital Stay: Admission: RE | Admit: 2018-08-10 | Payer: Self-pay | Source: Ambulatory Visit

## 2018-08-14 ENCOUNTER — Encounter (HOSPITAL_COMMUNITY): Payer: Self-pay

## 2018-08-14 ENCOUNTER — Emergency Department (HOSPITAL_COMMUNITY)
Admission: EM | Admit: 2018-08-14 | Discharge: 2018-08-15 | Disposition: A | Discharge status: Home / Self Care | Payer: Medicare Other | Attending: Emergency Medicine | Admitting: Emergency Medicine

## 2018-08-14 ENCOUNTER — Emergency Department (HOSPITAL_COMMUNITY): Payer: Medicare Other

## 2018-08-14 ENCOUNTER — Other Ambulatory Visit: Payer: Self-pay

## 2018-08-14 DIAGNOSIS — E039 Hypothyroidism, unspecified: Secondary | ICD-10-CM | POA: Insufficient documentation

## 2018-08-14 DIAGNOSIS — G8929 Other chronic pain: Secondary | ICD-10-CM

## 2018-08-14 DIAGNOSIS — I10 Essential (primary) hypertension: Secondary | ICD-10-CM | POA: Diagnosis not present

## 2018-08-14 DIAGNOSIS — Z79899 Other long term (current) drug therapy: Secondary | ICD-10-CM | POA: Insufficient documentation

## 2018-08-14 DIAGNOSIS — G249 Dystonia, unspecified: Secondary | ICD-10-CM | POA: Insufficient documentation

## 2018-08-14 DIAGNOSIS — Z87891 Personal history of nicotine dependence: Secondary | ICD-10-CM | POA: Insufficient documentation

## 2018-08-14 DIAGNOSIS — Z7982 Long term (current) use of aspirin: Secondary | ICD-10-CM | POA: Diagnosis not present

## 2018-08-14 DIAGNOSIS — J449 Chronic obstructive pulmonary disease, unspecified: Secondary | ICD-10-CM | POA: Diagnosis not present

## 2018-08-14 DIAGNOSIS — M25512 Pain in left shoulder: Secondary | ICD-10-CM

## 2018-08-14 LAB — CBC WITH DIFFERENTIAL/PLATELET
Abs Immature Granulocytes: 0.19 10*3/uL — ABNORMAL HIGH (ref 0.00–0.07)
Basophils Absolute: 0.1 10*3/uL (ref 0.0–0.1)
Basophils Relative: 1 %
Eosinophils Absolute: 0.2 10*3/uL (ref 0.0–0.5)
Eosinophils Relative: 1 %
HCT: 40.6 % (ref 36.0–46.0)
Hemoglobin: 12.2 g/dL (ref 12.0–15.0)
Immature Granulocytes: 1 %
Lymphocytes Relative: 17 %
Lymphs Abs: 3.1 10*3/uL (ref 0.7–4.0)
MCH: 29.9 pg (ref 26.0–34.0)
MCHC: 30 g/dL (ref 30.0–36.0)
MCV: 99.5 fL (ref 80.0–100.0)
Monocytes Absolute: 1.2 10*3/uL — ABNORMAL HIGH (ref 0.1–1.0)
Monocytes Relative: 7 %
Neutro Abs: 13.1 10*3/uL — ABNORMAL HIGH (ref 1.7–7.7)
Neutrophils Relative %: 73 %
Platelets: 356 10*3/uL (ref 150–400)
RBC: 4.08 MIL/uL (ref 3.87–5.11)
RDW: 13 % (ref 11.5–15.5)
WBC: 17.9 10*3/uL — ABNORMAL HIGH (ref 4.0–10.5)
nRBC: 0 % (ref 0.0–0.2)

## 2018-08-14 LAB — URINALYSIS, ROUTINE W REFLEX MICROSCOPIC
Bilirubin Urine: NEGATIVE
Glucose, UA: NEGATIVE mg/dL
Hgb urine dipstick: NEGATIVE
Ketones, ur: 5 mg/dL — AB
Leukocytes,Ua: NEGATIVE
Nitrite: NEGATIVE
Protein, ur: NEGATIVE mg/dL
Specific Gravity, Urine: 1.019 (ref 1.005–1.030)
pH: 7 (ref 5.0–8.0)

## 2018-08-14 LAB — COMPREHENSIVE METABOLIC PANEL
ALT: 23 U/L (ref 0–44)
AST: 27 U/L (ref 15–41)
Albumin: 3.7 g/dL (ref 3.5–5.0)
Alkaline Phosphatase: 78 U/L (ref 38–126)
Anion gap: 12 (ref 5–15)
BUN: 13 mg/dL (ref 8–23)
CO2: 26 mmol/L (ref 22–32)
Calcium: 9 mg/dL (ref 8.9–10.3)
Chloride: 106 mmol/L (ref 98–111)
Creatinine, Ser: 0.85 mg/dL (ref 0.44–1.00)
GFR calc Af Amer: >60 mL/min (ref >60)
GFR calc non Af Amer: >60 mL/min (ref >60)
Glucose, Bld: 104 mg/dL — ABNORMAL HIGH (ref 70–99)
Potassium: 4 mmol/L (ref 3.5–5.1)
Sodium: 144 mmol/L (ref 135–145)
Total Bilirubin: 0.4 mg/dL (ref 0.3–1.2)
Total Protein: 7 g/dL (ref 6.5–8.1)

## 2018-08-14 MED ORDER — DIPHENHYDRAMINE HCL 25 MG PO CAPS
25.0000 mg | ORAL_CAPSULE | Freq: Once | ORAL | Status: AC
  Administered 2018-08-14: 17:00:00 25 mg via ORAL
  Filled 2018-08-14: qty 1

## 2018-08-14 MED ORDER — HYDROCODONE-ACETAMINOPHEN 5-325 MG PO TABS
1.0000 | ORAL_TABLET | Freq: Once | ORAL | Status: AC
  Administered 2018-08-14: 1 via ORAL
  Filled 2018-08-14: qty 1

## 2018-08-14 NOTE — ED Notes (Signed)
Bed: NE14 Expected date:  Expected time:  Means of arrival:  Comments: EMS 68 yo shoulder pain

## 2018-08-14 NOTE — Progress Notes (Signed)
CSW received a call from the [facility] stating pt lives in [place] [facility] living [facility] and needs, per EPD, a referral for a psychiatrist which the [facility] CM will send to the CSW to provide to the pt.  Pt also needs a HH in-home referral also and the [facility] RN CM will send the CSW the paperwork to provide to the pt.  CSW will continue to follow for D/C needs.  Alphonse Guild. Orvis Stann, LCSW, LCAS, CSI Transitions of Care Clinical Social Worker Care Coordination Department Ph: [phone]

## 2018-08-14 NOTE — Progress Notes (Signed)
CSW met with the pt to update her, per [facility] RN CM that [facility] would be calling her on 08/15/18 to set up [facility] services with RN/Aide and PT.  Pt voiced understanding.  Pt provided her correct phone # at ph: [phone].  CSW provided the information for [facility] due to pt's request for a new psychiatric referral.  Pt was appreciative and thanked the CSW.  Please reconsult if future social work needs arise.  CSW signing off, as social work intervention is no longer needed.  Alphonse Guild. Raeann Offner, LCSW, LCAS, CSI Transitions of Care Clinical Social Worker Care Coordination Department Ph: [phone]

## 2018-08-14 NOTE — Care Management (Signed)
ED CM reviewed patient's records, noted that patient has had several ED visits in the past couple of months patient has hx of schizophrenia.  No PCP noted patient has [facility] health insurance. Patient presented to the ED today with c/o of shoulder pain which she states started over a year ago.  CM can make referral for PCP with CM services. CM will discuss with EDP and place f/u information on AVS.  CM will follow with update on transitional care plan.

## 2018-08-14 NOTE — ED Triage Notes (Signed)
Patient presented to ed with c/o shoulder pain which started about a year and half ago and today the pain is worse.

## 2018-08-14 NOTE — Care Management (Signed)
Patient has also reported that she needs a referral for [facility], EDP recommending PT for dystonia. CM can place referral for [facility] with [place] with Dr. Darleene Cleaver. Patient is at [place] contacted ED CSW to assist with providing information for arranging [facility] services. CM will send referral to the preferred [facility] Agencies to confirm which agency can accept [facility] for [facility] services. ED CM will follow up tomorrow.

## 2018-08-14 NOTE — ED Provider Notes (Addendum)
[facility] DEPT Provider Note   CSN: [id number] Arrival date & time: 08/14/18  1614    History   Chief Complaint No chief complaint on file.   HPI Dominique Acosta is a 68 y.o. female.     HPI  68 year old female comes in a chief complaint of shoulder pain and dystonia.  Patient has history of COPD, CAD, psychosis.  Patient states that she had a fall from her couch and she landed on her left shoulder.  She has been having worsening pain to her shoulder since then.  Patient also reports that her dystonia has gotten worse, making it more difficult for her to move around right now.  She states that she no longer sees a psychiatrist who was managing her conditions, and is unsure if she is taking any medications for dystonia.  Patient resides at her own house and has 2 aides that assist her and she states that the building also has a Education officer, museum.  Past Medical History:  Diagnosis Date  . Arm pain   . Chronic pain   . COPD (chronic obstructive pulmonary disease) ([place])   . Coronary artery disease   . Depression   . Dystonia   . Epilepsy ([place])   . High cholesterol   . Humerus fracture   . Hypothyroid   . Insomnia   . Leukemia ([place])   . Myocardial infarct ([place])   . Psychosis ([place])   . Schizophrenia ([place])   . Seizure [facility])     Patient Active Problem List   Diagnosis Date Noted  . Hypothyroidism due to acquired atrophy of thyroid 03/21/2017  . Dystonia 03/21/2017  . Dyslipidemia 03/21/2017  . COPD (chronic obstructive pulmonary disease) ([place]) 03/21/2017  . 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 MRSA pneumonia ([place]) 03/11/2017  . Acute respiratory failure with hypoxia ([place])   . Acute encephalopathy 02/22/2017  . Gastroesophageal reflux disease without esophagitis 09/11/2016  . Insomnia 08/26/2016  . Seizure disorder ([place]) 08/26/2016  . Humerus fracture 08/18/2016  . Acute metabolic encephalopathy 47/12/6281  . Acute lower UTI 08/03/2016  .  Hypokalemia 08/03/2016  . Leukocytosis 08/03/2016  . Benign essential HTN 08/03/2016  . Schizoaffective disorder, bipolar type ([place]) 07/08/2016  . Chronic constipation 09/02/2010    Past Surgical History:  Procedure Laterality Date  . BALLOON DILATION N/A 03/14/2017   Procedure: BALLOON DILATION;  Surgeon: Helayne Seminole, MD;  Location: [place];  Service: ENT;  Laterality: N/A;  . EXTERNAL EAR SURGERY    . HUMERUS FRACTURE SURGERY    . KENALOG INJECTION N/A 03/14/2017   Procedure: KENALOG INJECTION;  Surgeon: Helayne Seminole, MD;  Location: [place];  Service: ENT;  Laterality: N/A;  . MICROLARYNGOSCOPY WITH LASER N/A 03/14/2017   Procedure: MICROLARYNGOSCOPY WITH LASER;  Surgeon: Helayne Seminole, MD;  Location: [place];  Service: ENT;  Laterality: N/A;  . SHOULDER SURGERY       OB History   No obstetric history on file.      Home Medications    Prior to Admission medications   Medication Sig Start Date End Date Taking? Authorizing Provider  ARIPiprazole (ABILIFY) 20 MG tablet Place 1 tablet (20 mg total) into feeding tube daily. 03/18/17   Raiford Noble Latif, DO  aspirin EC 81 MG tablet Take 81 mg by mouth daily.    [provider]  baclofen (LIORESAL) 10 MG tablet Take 10 mg by mouth 3 (three) times daily.    [provider]  benztropine (  COGENTIN) 2 MG tablet Take 1 tablet (2 mg total) by mouth 3 (three) times daily. Patient not taking: Reported on 06/07/2018 01/26/17   [name], MD  divalproex (DEPAKOTE) 250 MG DR tablet Take 1 tablet (250 mg total) by mouth every 8 (eight) hours. 03/17/17   Raiford Noble Latif, DO  folic acid (FOLVITE) 1 MG tablet Place 1 tablet (1 mg total) into feeding tube daily. 03/18/17   Sheikh, Omair Latif, DO  ipratropium-albuterol (DUONEB) 0.5-2.5 (3) MG/3ML SOLN Take 3 mLs by nebulization 2 (two) times daily. 03/17/17   Raiford Noble Latif, DO  ketoconazole (NIZORAL) 2 % cream Apply 1 application topically 2 (two) times  daily. 04/20/18   Robyn Haber, MD  levETIRAcetam (KEPPRA) 1000 MG tablet Take 1 tablet (1,000 mg total) by mouth 2 (two) times daily. 03/17/17   Raiford Noble Latif, DO  levothyroxine (SYNTHROID, LEVOTHROID) 100 MCG tablet Take 1 tablet (100 mcg total) by mouth daily before breakfast. 03/18/17   Raiford Noble Latif, DO  Melatonin 5 MG TABS Take 5 mg by mouth at bedtime as needed (sleep).    [provider]  omeprazole (PRILOSEC) 20 MG capsule Take 20 mg by mouth daily.  06/09/17   [provider]  promethazine (PHENERGAN) 25 MG tablet Take 1 tablet (25 mg total) by mouth every 6 (six) hours as needed for up to 12 doses for nausea or vomiting. 06/07/18   Dorie Rank, MD  venlafaxine XR (EFFEXOR-XR) 150 MG 24 hr capsule Take 150 mg by mouth 2 (two) times daily.     [provider]    Family History Family History  Problem Relation Age of Onset  . Cancer Mother   . Breast cancer Mother   . Cancer Father   . Breast cancer Sister   . Breast cancer Maternal Grandmother     Social History Social History   Tobacco Use  . Smoking status: Former Smoker    Packs/day: 2.00    Years: 41.00    Pack years: 82.00    Types: Cigarettes  . Smokeless tobacco: Never Used  Substance Use Topics  . Alcohol use: No  . Drug use: No     Allergies   Amoxicillin; Penicillins; Phenytoin; Keflex [cephalexin]; Dilaudid [hydromorphone hcl]; Haldol [haloperidol]; and Morphine and related   Review of Systems Review of Systems  Constitutional: Positive for activity change.  Respiratory: Negative for shortness of breath.   Cardiovascular: Negative for chest pain.  Gastrointestinal: Negative for abdominal pain.  Musculoskeletal: Positive for arthralgias and myalgias.  All other systems reviewed and are negative.    Physical Exam Updated Vital Signs BP 118/63 (BP Location: Right Arm)   Pulse (!) 116   Temp 98.8 F (37.1 C) (Oral)   Resp 18   Ht 5' (1.524 m)   Wt 63.5 kg    SpO2 94%   BMI 27.34 kg/m   Physical Exam Vitals signs and nursing note reviewed.  Constitutional:      Appearance: She is well-developed.  HENT:     Head: Atraumatic.  Neck:     Musculoskeletal: Normal range of motion and neck supple.  Cardiovascular:     Rate and Rhythm: Normal rate.  Pulmonary:     Effort: Pulmonary effort is normal.  Abdominal:     General: Bowel sounds are normal.  Musculoskeletal:        General: Tenderness present. No swelling or deformity.  Skin:    General: Skin is warm and dry.  Neurological:  Mental Status: She is alert and oriented to person, place, and time.     ED Treatments / Results  Labs (all labs ordered are listed, but only abnormal results are displayed) Labs Reviewed  CBC WITH DIFFERENTIAL/PLATELET - Abnormal; Notable for the following components:      Result Value   WBC 17.9 (*)    Neutro Abs 13.1 (*)    Monocytes Absolute 1.2 (*)    Abs Immature Granulocytes 0.19 (*)    All other components within normal limits  COMPREHENSIVE METABOLIC PANEL - Abnormal; Notable for the following components:   Glucose, Bld 104 (*)    All other components within normal limits  URINALYSIS, ROUTINE W REFLEX MICROSCOPIC - Abnormal; Notable for the following components:   Ketones, ur 5 (*)    All other components within normal limits    EKG EKG Interpretation  Date/Time:  Tuesday Aug 14 2018 17:04:59 EDT Ventricular Rate:  111 PR Interval:    QRS Duration: 95 QT Interval:  332 QTC Calculation: 452 R Axis:   38 Text Interpretation:  Sinus tachycardia No acute changes No significant change since last tracing Confirmed by Varney Biles [phone]) on 08/14/2018 5:54:35 PM   Radiology Dg Forearm Left  Result Date: 08/14/2018 CLINICAL DATA:  Arm pain EXAM: LEFT FOREARM - 2 VIEW COMPARISON:  None. FINDINGS: No fracture or malalignment.  Mild soft tissue edema. IMPRESSION: No acute osseous abnormality. Electronically Signed   By: Donavan Foil  M.D.   On: 08/14/2018 17:57   Dg Humerus Left  Result Date: 08/14/2018 CLINICAL DATA:  Upper arm pain EXAM: LEFT HUMERUS - 2+ VIEW COMPARISON:  Shoulder radiograph 08/18/2016 FINDINGS: Mild AC joint degenerative change. Old fracture deformity of the proximal left humerus. No dislocation. IMPRESSION: No acute osseous abnormality. Old fracture deformity of the proximal left humerus Electronically Signed   By: Donavan Foil M.D.   On: 08/14/2018 17:55    Procedures Procedures (including critical care time)  Medications Ordered in ED Medications  HYDROcodone-acetaminophen (NORCO/VICODIN) 5-325 MG per tablet 1 tablet (1 tablet Oral Given 08/14/18 1702)  diphenhydrAMINE (BENADRYL) capsule 25 mg (25 mg Oral Given 08/14/18 1702)     Initial Impression / Assessment and Plan / ED Course  I have reviewed the triage vital signs and the nursing notes.  Pertinent labs & imaging results that were available during my care of the patient were reviewed by me and considered in my medical decision making (see chart for details).        68 year old female comes in a chief complaint of shoulder pain and left arm pain.  She reports she had a fall from a couch earlier today and has been having worsening pain on that side.  She has chronic pain to the same site.  She is also reporting that she has her dystonia getting worse than usual.  Unfortunately, she no longer sees a psychiatrist who is managing her medications.   I called case management to see if we can ensure patient gets a close follow-up with the psychiatry. Additionally will see if she needs PT, OT or any other home assistance.  She already has a Music therapist x2 at the moment.  11:48 PM Case management and social work consult completed. Appropriate home health consult orders placed. They will assist with psych f/u - she has insurance which helps. Stable for discharge.  Final Clinical Impressions(s) / ED Diagnoses   Final diagnoses:  Dystonia   Chronic left shoulder pain  ED Discharge Orders         [place]     08/14/18 2253    Face-to-face encounter (required for Medicare/Medicaid patients)    Comments:  I Tashira Torre certify that this patient is under my care and that I, or a nurse practitioner or physician's assistant working with me, had a face-to-face encounter that meets the physician face-to-face encounter requirements with this patient on 08/14/2018. The encounter with the patient was in whole, or in part for the following medical condition(s) which is the primary reason for home health care (List medical condition): difficulty walking   08/14/18 2253           Varney Biles, MD 08/14/18 [place], [place], MD 08/14/18 2348

## 2018-08-17 ENCOUNTER — Telehealth: Payer: Self-pay | Admitting: Cardiology

## 2018-08-17 NOTE — Telephone Encounter (Signed)
Called and spoke to [place] at [facility]. Made her aware that Dr. Marlou Porch said that the patient needs to establish with PCP to sign orders for PT/OT. Rudi Coco the number to [facility] [phone]) to give to the patient to call and establish PCP.

## 2018-08-17 NOTE — Telephone Encounter (Signed)
° ° °  [place] would like to know if Dr Marlou Porch will be responsible for signing [place] orders going forward. Please call [phone]

## 2018-08-17 NOTE — Telephone Encounter (Signed)
Returned call to [place] at  [facility]. She is requesting that Dr. Marlou Porch be responsible for signing her PT/OT HH orders going forward. She states that the patient is a psych patient who has COPD and has had a recent fall and will need PT/OT. Made Mary aware that we typically do not sign for PT/OT orders that these are usually managed by PCP. She states that the patient does not have a PCP. Made her aware that Dr. Marlou Porch has not seen this patient yet and she is on as a new patient next week. Will send message for review.

## 2018-08-17 NOTE — Telephone Encounter (Signed)
Sorry. Needs to establish a PCP to sign the [facility] PT OT orders.  Candee Furbish, MD

## 2018-08-20 ENCOUNTER — Telehealth: Payer: Self-pay

## 2018-08-20 ENCOUNTER — Other Ambulatory Visit: Payer: Self-pay

## 2018-08-20 ENCOUNTER — Encounter: Payer: Self-pay | Admitting: Cardiology

## 2018-08-20 ENCOUNTER — Telehealth (INDEPENDENT_AMBULATORY_CARE_PROVIDER_SITE_OTHER): Payer: Medicare Other | Admitting: Cardiology

## 2018-08-20 VITALS — Ht 60.0 in

## 2018-08-20 DIAGNOSIS — Z01810 Encounter for preprocedural cardiovascular examination: Secondary | ICD-10-CM

## 2018-08-20 DIAGNOSIS — N63 Unspecified lump in unspecified breast: Secondary | ICD-10-CM

## 2018-08-20 DIAGNOSIS — Z720 Tobacco use: Secondary | ICD-10-CM

## 2018-08-20 DIAGNOSIS — F250 Schizoaffective disorder, bipolar type: Secondary | ICD-10-CM

## 2018-08-20 NOTE — Progress Notes (Signed)
Virtual Visit via Video Note   This visit type was conducted due to national recommendations for restrictions regarding the COVID-19 Pandemic (e.g. social distancing) in an effort to limit this patient's exposure and mitigate transmission in our community.  Due to her co-morbid illnesses, this patient is at least at moderate risk for complications without adequate follow up.  This format is felt to be most appropriate for this patient at this time.  All issues noted in this document were discussed and addressed.  A limited physical exam was performed with this format.  Please refer to the patient's chart for her consent to telehealth for [facility].   Date:  08/20/2018   ID:  Dominique Acosta, DOB 20-May-1950, MRN [id number]  Patient Location: Home Provider Location: Home  PCP:  System, Provider Not In  Cardiologist:  Candee Furbish, MD  Electrophysiologist:  None   Evaluation Performed:  New Patient Evaluation  Chief Complaint: Right breast mass, preoperative cardiac risk ratification prior to general anesthesia/surgery, Dr. Ninfa Linden referral.  History of Present Illness:    Dominique Acosta is a 68 y.o. female with COPD and recent fall (trying to establish with PCP to sign PT/OT Home health orders, Dr. Kathrynn Acosta initiated these orders) who was in the emergency department on 08/14/2018 after falling from her couch and landing on her left shoulder.  She has a history of CAD and psychosis.  Dystonia had gotten worse making it more difficult for her to move around according to ER note.  She no longer sees a psychiatrist.  She was unsure if she was taking any medications for dystonia.  She has 2 aides that assist her at her [facility] and also has a Education officer, museum.  EKG personally reviewed showed sinus tachycardia heart rate 111 with no significant changes. There was no mention of any cardiovascular complaints during that ED encounter.  She was originally referred by Dominique Acosta of [place]  surgery back on 06/28/2018 secondary to breast mass.  In review of Dr. Trevor Acosta note she had a mammogram that showed a large greater than 4 cm mass in the right breast worrisome for malignancy.  She refused a stereo guided biopsy and reports that she needs to be put to sleep for any surgery.  No pain in the breast, no nipple discharge.  She had flight of ideas doing that visit..  Coronary artery disease as listed on her past medical history, I have reviewed care everywhere as well, do not see any explanation behind this.    I was able to find a echocardiogram report from 12/22/2009 which under past medical history shows prior MI.  It showed the following:  - Left ventricle: The cavity size was normal. Wall thickness was  normal. Systolic function was normal. The estimated ejection  fraction was in the range of 55% to 60%. Wall motion was normal;  there were no regional wall motion abnormalities. Left ventricular  diastolic function parameters were normal. - Mitral valve: Trace to mild regurgitation. - Inferior vena cava: The vessel was normal in size; the  respirophasic diameter changes were in the normal range (= 50%);  findings are consistent with normal central venous pressure.  Reassuring.  Recent BNP was normal 12.1, troponin was normal at 0.  Creatinine 0.83.  LDL 56.  Excellent.  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 7 MI's past several years. Last MI ws 4 years ago. However 9 years ago 2011,  ECHO was normal.  She does not describe any chest discomfort.  She continues to smoke.  She uses a walker because of her dystonia.  She states that she would like to sit down in a walker if possible.  Unable to fully climb stairs.  Tends to get heart palpitations. Takes potassium. No real pain. Has dystonia. Sick of being sick. Uses walker.  Mother and grandmother died of breast cancer.  Dominique Acosta drives her. M, W, F.   Tobacco use  ongoing.     The patient does not have symptoms concerning for COVID-19 infection (fever, chills, cough, or new shortness of breath).    Past Medical History:  Diagnosis Date  . Arm pain   . Chronic pain   . COPD (chronic obstructive pulmonary disease) ([place])   . Coronary artery disease   . Depression   . Dystonia   . Epilepsy ([place])   . High cholesterol   . Humerus fracture   . Hypothyroid   . Insomnia   . Leukemia ([place])   . Myocardial infarct ([place])   . Psychosis ([place])   . Schizophrenia ([place])   . Seizure [facility])    Past Surgical History:  Procedure Laterality Date  . BALLOON DILATION N/A 03/14/2017   Procedure: BALLOON DILATION;  Surgeon: Helayne Seminole, MD;  Location: [place];  Service: ENT;  Laterality: N/A;  . EXTERNAL EAR SURGERY    . HUMERUS FRACTURE SURGERY    . KENALOG INJECTION N/A 03/14/2017   Procedure: KENALOG INJECTION;  Surgeon: Helayne Seminole, MD;  Location: [place];  Service: ENT;  Laterality: N/A;  . MICROLARYNGOSCOPY WITH LASER N/A 03/14/2017   Procedure: MICROLARYNGOSCOPY WITH LASER;  Surgeon: Helayne Seminole, MD;  Location: [place];  Service: ENT;  Laterality: N/A;  . SHOULDER SURGERY       Current Meds  Medication Sig  . ARIPiprazole (ABILIFY) 20 MG tablet Place 1 tablet (20 mg total) into feeding tube daily.  Marland Kitchen aspirin EC 81 MG tablet Take 81 mg by mouth daily.  . baclofen (LIORESAL) 10 MG tablet Take 10 mg by mouth 3 (three) times daily.  . benztropine (COGENTIN) 2 MG tablet Take 1 tablet (2 mg total) by mouth 3 (three) times daily.  . divalproex (DEPAKOTE) 250 MG DR tablet Take 1 tablet (250 mg total) by mouth every 8 (eight) hours.  . folic acid (FOLVITE) 1 MG tablet Place 1 tablet (1 mg total) into feeding tube daily.  Marland Kitchen ipratropium-albuterol (DUONEB) 0.5-2.5 (3) MG/3ML SOLN Take 3 mLs by nebulization 2 (two) times daily.  Marland Kitchen ketoconazole (NIZORAL) 2 % cream Apply 1 application topically 2 (two) times daily.  Marland Kitchen levETIRAcetam (KEPPRA) 1000  MG tablet Take 1 tablet (1,000 mg total) by mouth 2 (two) times daily.  . Melatonin 5 MG TABS Take 5 mg by mouth at bedtime as needed (sleep).  Marland Kitchen omeprazole (PRILOSEC) 20 MG capsule Take 20 mg by mouth daily.   . promethazine (PHENERGAN) 25 MG tablet Take 1 tablet (25 mg total) by mouth every 6 (six) hours as needed for up to 12 doses for nausea or vomiting.  Marland Kitchen QUEtiapine (SEROQUEL) 100 MG tablet Take 25 mg by mouth at bedtime.  Marland Kitchen venlafaxine XR (EFFEXOR-XR) 150 MG 24 hr capsule Take 150 mg by mouth 2 (two) times daily.      Allergies:   Amoxicillin; Penicillins; Phenytoin; Keflex [cephalexin]; Dilaudid [hydromorphone hcl]; Haldol [haloperidol]; and Morphine and related   Social History   Tobacco Use  .  Smoking status: Former Smoker    Packs/day: 2.00    Years: 41.00    Pack years: 82.00    Types: Cigarettes  . Smokeless tobacco: Never Used  Substance Use Topics  . Alcohol use: No  . Drug use: No     Family Hx: The patient's family history includes Breast cancer in her maternal grandmother, mother, and sister; Cancer in her father and mother.  ROS:   Please see the history of present illness.    No fevers chills nausea vomiting syncope bleeding. All other systems reviewed and are negative.   Prior CV studies:   The following studies were reviewed today:  Echo 2011-normal  Labs/Other Tests and Data Reviewed:    EKG:  As above in HPI personally reviewed  Recent Labs: 03/16/2018: TSH 15.643 08/14/2018: ALT 23; BUN 13; Creatinine, Ser 0.85; Hemoglobin 12.2; Platelets 356; Potassium 4.0; Sodium 144   Recent Lipid Panel Lab Results  Component Value Date/Time   CHOL 120 03/17/2017 03:10 AM   TRIG 130 03/17/2017 03:10 AM   HDL 38 (L) 03/17/2017 03:10 AM   CHOLHDL 3.2 03/17/2017 03:10 AM   LDLCALC 56 03/17/2017 03:10 AM    Wt Readings from Last 3 Encounters:  08/14/18 140 lb (63.5 kg)  07/14/17 180 lb (81.6 kg)  03/23/17 180 lb 8 oz (81.9 kg)     Objective:     Vital Signs:  Ht 5' (1.524 m)   BMI 27.34 kg/m    VITAL SIGNS:  reviewed, tangential on phone.  Alert.  Pleasant otherwise.  No respiratory distress noted.  ASSESSMENT & PLAN:    Preoperative risk stratification for possible breast malignancy, general anesthesia at the request of Dr. Ninfa Linden - She reports that she has had multiple heart attacks, possibly 10 however a prior echocardiogram in our system in 2011 showed normal EF.  No signs of prior damage.  I am unable to perform a nuclear stress test because of her tardive dyskinesia.  She is not having any debilitating chest pain or other anginal symptoms but it is hard for me to get a clear history.  She continues to smoke.  Certainly given her age and risk factors she could have underlying coronary artery disease.  Nonetheless, I think would be helpful for [place] to proceed with echocardiogram since it is been 9 years to make sure that she does not have any evidence of significant cardiomyopathy.  This will be helpful in our preoperative or stratification for her.  I do not think that she needs invasive therapy at this time.  Continue to encourage secondary risk factor prevention.  Continue to encourage her obtaining a primary care physician again.  Schizoaffective disorder - Continue to encourage psychiatric counseling and care.  Tobacco use -Tobacco cessation discussed.  COVID-19 Education: The signs and symptoms of COVID-19 were discussed with the patient and how to seek care for testing (follow up with PCP or arrange E-visit).  The importance of social distancing was discussed today.  Time:   Today, I have spent 20 minutes with the patient with telehealth technology discussing the above problems.     Medication Adjustments/Labs and Tests Ordered: Current medicines are reviewed at length with the patient today.  Concerns regarding medicines are outlined above.   Tests Ordered: Orders Placed This Encounter  Procedures  . ECHOCARDIOGRAM  COMPLETE    Medication Changes: No orders of the defined types were placed in this encounter.   Disposition:  Follow up prn, we will follow-up with  echocardiogram  Signed, Candee Furbish, MD  08/20/2018 11:03 AM    [facility]

## 2018-08-20 NOTE — Telephone Encounter (Signed)
Follow up   Dominique Acosta is calling from [facility] to see is Dr. Marlou Acosta is willing to sign Home Health Orders. She is following up the previous message.

## 2018-08-20 NOTE — Patient Instructions (Addendum)
Medication Instructions:  Your physician recommends that you continue on your current medications as directed. Please refer to the Current Medication list given to you today.  If you need a refill on your cardiac medications before your next appointment, please call your pharmacy.   Lab work: None Ordered  If you have labs (blood work) drawn today and your tests are completely normal, you will receive your results only by: Marland Kitchen MyChart Message (if you have MyChart) OR . A paper copy in the mail If you have any lab test that is abnormal or we need to change your treatment, we will call you to review the results.  Testing/Procedures: Your physician has requested that you have an echocardiogram on 09/12/18 at 10:30 AM. Echocardiography is a painless test that uses sound waves to create images of your heart. It provides your doctor with information about the size and shape of your heart and how well your heart's chambers and valves are working. This procedure takes approximately one hour. There are no restrictions for this procedure.  Follow-Up: . AS NEEDED  Any Other Special Instructions Will Be Listed Below (If Applicable).

## 2018-08-20 NOTE — Telephone Encounter (Signed)
Spoke with Colletta Maryland with [facility] and advised that Dr. Marlou Porch said patient needs PCP to sign PT/OT orders. Colletta Maryland verbalized understanding and thanked me for the call.

## 2018-08-21 NOTE — Telephone Encounter (Signed)
Called [facility] to notify them this answer was addressed by Dr Marlou Porch 4 days ago and orders where given to [facility] by Drue Novel, RN that pt will need to est with PCP to sign orders for PT/OT.

## 2018-09-10 ENCOUNTER — Telehealth: Payer: Self-pay | Admitting: Cardiology

## 2018-09-10 NOTE — Telephone Encounter (Signed)
Called Mel Almond with Home Health back about patient and her message. Informed her that Dr. Marlou Porch has only seen patient once and does not prescribe any BP medications for patient. Patient saw Dr. Marlou Porch for surgical clearance and was to follow up as needed. Informed her that she can give patient something to drink and a little salty snack to see if that helps. Informed her to call patient's PCP for further advisement. Baily verbalized understanding and will call patient's PCP. Will send message to Dr. Marlou Porch and his nurse so they are aware.

## 2018-09-10 NOTE — Telephone Encounter (Signed)
New Message:   [facility] Nurse asking for Triage Nurse. She is at the pt's home. Pt's Blood Pressure is low, it is 84/56 and heart rate is 101.;

## 2018-09-11 ENCOUNTER — Telehealth (HOSPITAL_COMMUNITY): Payer: Self-pay | Admitting: Cardiology

## 2018-09-11 NOTE — Telephone Encounter (Signed)
No answer

## 2018-09-11 NOTE — Telephone Encounter (Signed)

## 2018-09-12 ENCOUNTER — Other Ambulatory Visit (HOSPITAL_COMMUNITY): Payer: Medicare Other

## 2018-09-21 ENCOUNTER — Telehealth: Payer: Self-pay | Admitting: Cardiology

## 2018-09-21 NOTE — Telephone Encounter (Signed)
Attempted to call [name] with [facility] back and the incorrect number was taken down. Number provided is someone's private cell phone, and they replied "this is not [name]."  Attempted x 2 and same person answered with same reply. Was calling to obtain more information for Dr. Marlou Porch and RN.  Will be sending this message to Dr. Marlou Porch RN as an Juluis Rainier, and to the operator, to see if obtaining the correct contact number is possible.

## 2018-09-21 NOTE — Telephone Encounter (Signed)
New message  Per [facility] states that did not get to have a visit with patient today. She states that she will see patient next week per [name].

## 2018-09-25 NOTE — Telephone Encounter (Signed)
Pt's PCP is following/signing orders for home health.

## 2018-09-27 ENCOUNTER — Telehealth: Payer: Self-pay | Admitting: Cardiology

## 2018-09-27 NOTE — Telephone Encounter (Signed)
New Message:     Randell Patient  the Education officer, museum from [employer]. She wanted to let you know pt missed her visit today. She was unable to reach the patient.

## 2018-09-27 NOTE — Telephone Encounter (Signed)
Noted - Dr Marlou Porch is not following her home health however.

## 2018-09-30 ENCOUNTER — Emergency Department (HOSPITAL_COMMUNITY): Payer: Medicare Other

## 2018-09-30 ENCOUNTER — Emergency Department (HOSPITAL_COMMUNITY)
Admission: EM | Admit: 2018-09-30 | Discharge: 2018-10-05 | Disposition: A | Discharge status: Other Acute Inpatient Hospital | Payer: Medicare Other | Attending: Emergency Medicine | Admitting: Emergency Medicine

## 2018-09-30 DIAGNOSIS — F250 Schizoaffective disorder, bipolar type: Secondary | ICD-10-CM | POA: Diagnosis not present

## 2018-09-30 DIAGNOSIS — R441 Visual hallucinations: Secondary | ICD-10-CM | POA: Diagnosis not present

## 2018-09-30 DIAGNOSIS — F29 Unspecified psychosis not due to a substance or known physiological condition: Secondary | ICD-10-CM | POA: Diagnosis not present

## 2018-09-30 DIAGNOSIS — R4182 Altered mental status, unspecified: Secondary | ICD-10-CM | POA: Diagnosis not present

## 2018-09-30 DIAGNOSIS — F209 Schizophrenia, unspecified: Secondary | ICD-10-CM | POA: Diagnosis not present

## 2018-09-30 DIAGNOSIS — I2510 Atherosclerotic heart disease of native coronary artery without angina pectoris: Secondary | ICD-10-CM | POA: Insufficient documentation

## 2018-09-30 DIAGNOSIS — B372 Candidiasis of skin and nail: Secondary | ICD-10-CM

## 2018-09-30 DIAGNOSIS — R410 Disorientation, unspecified: Secondary | ICD-10-CM

## 2018-09-30 DIAGNOSIS — E039 Hypothyroidism, unspecified: Secondary | ICD-10-CM | POA: Insufficient documentation

## 2018-09-30 DIAGNOSIS — R440 Auditory hallucinations: Secondary | ICD-10-CM | POA: Diagnosis not present

## 2018-09-30 DIAGNOSIS — I252 Old myocardial infarction: Secondary | ICD-10-CM | POA: Insufficient documentation

## 2018-09-30 DIAGNOSIS — Z79899 Other long term (current) drug therapy: Secondary | ICD-10-CM | POA: Diagnosis not present

## 2018-09-30 DIAGNOSIS — Z7982 Long term (current) use of aspirin: Secondary | ICD-10-CM | POA: Diagnosis not present

## 2018-09-30 DIAGNOSIS — Z03818 Encounter for observation for suspected exposure to other biological agents ruled out: Secondary | ICD-10-CM | POA: Insufficient documentation

## 2018-09-30 DIAGNOSIS — Z008 Encounter for other general examination: Secondary | ICD-10-CM | POA: Diagnosis not present

## 2018-09-30 DIAGNOSIS — R451 Restlessness and agitation: Secondary | ICD-10-CM | POA: Diagnosis not present

## 2018-09-30 DIAGNOSIS — Z87891 Personal history of nicotine dependence: Secondary | ICD-10-CM | POA: Diagnosis not present

## 2018-09-30 DIAGNOSIS — J449 Chronic obstructive pulmonary disease, unspecified: Secondary | ICD-10-CM | POA: Insufficient documentation

## 2018-09-30 LAB — URINALYSIS, ROUTINE W REFLEX MICROSCOPIC
Bacteria, UA: NONE SEEN
Bilirubin Urine: NEGATIVE
Glucose, UA: NEGATIVE mg/dL
Hgb urine dipstick: NEGATIVE
Ketones, ur: 20 mg/dL — AB
Leukocytes,Ua: NEGATIVE
Nitrite: NEGATIVE
Protein, ur: 30 mg/dL — AB
Specific Gravity, Urine: 1.028 (ref 1.005–1.030)
pH: 6 (ref 5.0–8.0)

## 2018-09-30 LAB — RAPID URINE DRUG SCREEN, HOSP PERFORMED
Amphetamines: NOT DETECTED
Barbiturates: NOT DETECTED
Benzodiazepines: NOT DETECTED
Cocaine: NOT DETECTED
Opiates: NOT DETECTED
Tetrahydrocannabinol: NOT DETECTED

## 2018-09-30 LAB — CBC WITH DIFFERENTIAL/PLATELET
Abs Immature Granulocytes: 0.13 10*3/uL — ABNORMAL HIGH (ref 0.00–0.07)
Basophils Absolute: 0.1 10*3/uL (ref 0.0–0.1)
Basophils Relative: 0 %
Eosinophils Absolute: 0 10*3/uL (ref 0.0–0.5)
Eosinophils Relative: 0 %
HCT: 40.7 % (ref 36.0–46.0)
Hemoglobin: 12.7 g/dL (ref 12.0–15.0)
Immature Granulocytes: 1 %
Lymphocytes Relative: 14 %
Lymphs Abs: 2.4 10*3/uL (ref 0.7–4.0)
MCH: 30 pg (ref 26.0–34.0)
MCHC: 31.2 g/dL (ref 30.0–36.0)
MCV: 96 fL (ref 80.0–100.0)
Monocytes Absolute: 1.3 10*3/uL — ABNORMAL HIGH (ref 0.1–1.0)
Monocytes Relative: 7 %
Neutro Abs: 13.5 10*3/uL — ABNORMAL HIGH (ref 1.7–7.7)
Neutrophils Relative %: 78 %
Platelets: 312 10*3/uL (ref 150–400)
RBC: 4.24 MIL/uL (ref 3.87–5.11)
RDW: 13.2 % (ref 11.5–15.5)
WBC: 17.4 10*3/uL — ABNORMAL HIGH (ref 4.0–10.5)
nRBC: 0 % (ref 0.0–0.2)

## 2018-09-30 LAB — COMPREHENSIVE METABOLIC PANEL
ALT: 18 U/L (ref 0–44)
AST: 17 U/L (ref 15–41)
Albumin: 3.7 g/dL (ref 3.5–5.0)
Alkaline Phosphatase: 70 U/L (ref 38–126)
Anion gap: 10 (ref 5–15)
BUN: 17 mg/dL (ref 8–23)
CO2: 27 mmol/L (ref 22–32)
Calcium: 8.8 mg/dL — ABNORMAL LOW (ref 8.9–10.3)
Chloride: 103 mmol/L (ref 98–111)
Creatinine, Ser: 0.71 mg/dL (ref 0.44–1.00)
GFR calc Af Amer: >60 mL/min (ref >60)
GFR calc non Af Amer: >60 mL/min (ref >60)
Glucose, Bld: 113 mg/dL — ABNORMAL HIGH (ref 70–99)
Potassium: 3.7 mmol/L (ref 3.5–5.1)
Sodium: 140 mmol/L (ref 135–145)
Total Bilirubin: 0.5 mg/dL (ref 0.3–1.2)
Total Protein: 6.8 g/dL (ref 6.5–8.1)

## 2018-09-30 LAB — ETHANOL: Alcohol, Ethyl (B): <10 mg/dL (ref <10)

## 2018-09-30 LAB — PROTIME-INR
INR: 1 (ref 0.8–1.2)
Prothrombin Time: 12.9 seconds (ref 11.4–15.2)

## 2018-09-30 LAB — SARS CORONAVIRUS 2 BY RT PCR (HOSPITAL ORDER, PERFORMED IN ~~LOC~~ HOSPITAL LAB): SARS Coronavirus 2: NEGATIVE

## 2018-09-30 LAB — CBG MONITORING, ED: Glucose-Capillary: 105 mg/dL — ABNORMAL HIGH (ref 70–99)

## 2018-09-30 LAB — AMMONIA: Ammonia: 13 umol/L (ref 9–35)

## 2018-09-30 LAB — VALPROIC ACID LEVEL: Valproic Acid Lvl: <10 ug/mL — ABNORMAL LOW (ref 50.0–100.0)

## 2018-09-30 MED ORDER — SODIUM CHLORIDE 0.9 % IV BOLUS
1000.0000 mL | Freq: Once | INTRAVENOUS | Status: AC
  Administered 2018-09-30: 1000 mL via INTRAVENOUS

## 2018-09-30 MED ORDER — LORAZEPAM 2 MG/ML IJ SOLN
1.0000 mg | Freq: Once | INTRAMUSCULAR | Status: AC
  Administered 2018-09-30: 20:00:00 1 mg via INTRAMUSCULAR

## 2018-09-30 MED ORDER — NYSTATIN 100000 UNIT/GM EX POWD
Freq: Two times a day (BID) | CUTANEOUS | Status: DC
  Administered 2018-10-01 – 2018-10-05 (×8): via TOPICAL
  Filled 2018-09-30 (×2): qty 15

## 2018-09-30 MED ORDER — LORAZEPAM 2 MG/ML IJ SOLN
1.0000 mg | Freq: Once | INTRAMUSCULAR | Status: DC
  Filled 2018-09-30: qty 1

## 2018-09-30 NOTE — BHH Counselor (Signed)
Clinician called spoke to Joellen Jersey, RN to gather information on pt and set up cart. Clinician called TTS cart to no answer. Clinician to call back.    Vertell Novak, [place], [facility], [facility] Triage Specialist [phone]

## 2018-09-30 NOTE — ED Provider Notes (Signed)
[place] DEPT Provider Note   CSN: [id number] Arrival date & time: 09/30/18  1851     History   Chief Complaint Chief Complaint  Patient presents with  . Altered Mental Status    HPI Dominique Acosta is a 68 y.o. female.  Level 5 caveat secondary to altered mental status.  68 year old female with prior history of psychiatric disease seizure disorder.  She was noted by tenants in the apartment complex that she lives in to be knocking on doors and not making sense.  Please were called and ultimately EMS was involved and brought her to the emergency department.  She possibly has been vomiting and has some dried green on her shirt front.  She is unable to answer any questions but just has some repetitive and confusing speech.     The history is provided by the EMS personnel.  Altered Mental Status Presenting symptoms: behavior changes and disorientation   Severity:  Unable to specify Most recent episode:  Today Timing:  Constant Progression:  Unchanged Chronicity:  New   Past Medical History:  Diagnosis Date  . Arm pain   . Chronic pain   . COPD (chronic obstructive pulmonary disease) ([place])   . Coronary artery disease   . Depression   . Dystonia   . Epilepsy ([place])   . High cholesterol   . Humerus fracture   . Hypothyroid   . Insomnia   . Leukemia ([place])   . Myocardial infarct ([place])   . Psychosis ([place])   . Schizophrenia ([place])   . Seizure [facility])     Patient Active Problem List   Diagnosis Date Noted  . Hypothyroidism due to acquired atrophy of thyroid 03/21/2017  . Dystonia 03/21/2017  . Dyslipidemia 03/21/2017  . COPD (chronic obstructive pulmonary disease) ([place]) 03/21/2017  . 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 MRSA pneumonia ([place]) 03/11/2017  . Acute respiratory failure with hypoxia ([place])   . Acute encephalopathy 02/22/2017  . Gastroesophageal reflux disease without esophagitis 09/11/2016  . Insomnia 08/26/2016  . Seizure disorder  ([place]) 08/26/2016  . Humerus fracture 08/18/2016  . Acute metabolic encephalopathy 11/94/1740  . Acute lower UTI 08/03/2016  . Hypokalemia 08/03/2016  . Leukocytosis 08/03/2016  . Benign essential HTN 08/03/2016  . Schizoaffective disorder, bipolar type ([place]) 07/08/2016  . Chronic constipation 09/02/2010    Past Surgical History:  Procedure Laterality Date  . BALLOON DILATION N/A 03/14/2017   Procedure: BALLOON DILATION;  Surgeon: Helayne Seminole, MD;  Location: [place];  Service: ENT;  Laterality: N/A;  . EXTERNAL EAR SURGERY    . HUMERUS FRACTURE SURGERY    . KENALOG INJECTION N/A 03/14/2017   Procedure: KENALOG INJECTION;  Surgeon: Helayne Seminole, MD;  Location: [place];  Service: ENT;  Laterality: N/A;  . MICROLARYNGOSCOPY WITH LASER N/A 03/14/2017   Procedure: MICROLARYNGOSCOPY WITH LASER;  Surgeon: Helayne Seminole, MD;  Location: [place];  Service: ENT;  Laterality: N/A;  . SHOULDER SURGERY       OB History   No obstetric history on file.      Home Medications    Prior to Admission medications   Medication Sig Start Date End Date Taking? Authorizing Provider  ARIPiprazole (ABILIFY) 20 MG tablet Place 1 tablet (20 mg total) into feeding tube daily. 03/18/17   Raiford Noble Latif, DO  aspirin EC 81 MG tablet Take 81 mg by mouth daily.    [provider]  baclofen (LIORESAL) 10 MG tablet Take 10 mg  by mouth 3 (three) times daily.    [provider]  benztropine (COGENTIN) 2 MG tablet Take 1 tablet (2 mg total) by mouth 3 (three) times daily. 01/26/17   [name], MD  divalproex (DEPAKOTE) 250 MG DR tablet Take 1 tablet (250 mg total) by mouth every 8 (eight) hours. 03/17/17   Raiford Noble Latif, DO  folic acid (FOLVITE) 1 MG tablet Place 1 tablet (1 mg total) into feeding tube daily. 03/18/17   Sheikh, Omair Latif, DO  ipratropium-albuterol (DUONEB) 0.5-2.5 (3) MG/3ML SOLN Take 3 mLs by nebulization 2 (two) times daily. 03/17/17   Raiford Noble  Latif, DO  ketoconazole (NIZORAL) 2 % cream Apply 1 application topically 2 (two) times daily. 04/20/18   Robyn Haber, MD  levETIRAcetam (KEPPRA) 1000 MG tablet Take 1 tablet (1,000 mg total) by mouth 2 (two) times daily. 03/17/17   Raiford Noble Latif, DO  levothyroxine (SYNTHROID) 112 MCG tablet Take 112 mcg by mouth daily. 08/02/18   [provider]  Melatonin 5 MG TABS Take 5 mg by mouth at bedtime as needed (sleep).    [provider]  omeprazole (PRILOSEC) 20 MG capsule Take 20 mg by mouth daily.  06/09/17   [provider]  promethazine (PHENERGAN) 25 MG tablet Take 1 tablet (25 mg total) by mouth every 6 (six) hours as needed for up to 12 doses for nausea or vomiting. 06/07/18   Dorie Rank, MD  QUEtiapine (SEROQUEL) 100 MG tablet Take 25 mg by mouth at bedtime. 06/12/18   [provider]  venlafaxine XR (EFFEXOR-XR) 150 MG 24 hr capsule Take 150 mg by mouth 2 (two) times daily.     [provider]    Family History Family History  Problem Relation Age of Onset  . Cancer Mother   . Breast cancer Mother   . Cancer Father   . Breast cancer Sister   . Breast cancer Maternal Grandmother     Social History Social History   Tobacco Use  . Smoking status: Former Smoker    Packs/day: 2.00    Years: 41.00    Pack years: 82.00    Types: Cigarettes  . Smokeless tobacco: Never Used  Substance Use Topics  . Alcohol use: No  . Drug use: No     Allergies   Amoxicillin, Penicillins, Phenytoin, Keflex [cephalexin], Dilaudid [hydromorphone hcl], Haldol [haloperidol], and Morphine and related   Review of Systems Review of Systems  Unable to perform ROS: Mental status change     Physical Exam Updated Vital Signs BP (!) 151/75   Pulse 77   Temp 100.3 F (37.9 C) (Rectal)   Resp 20   SpO2 93%   Physical Exam Vitals signs and nursing note reviewed.  Constitutional:      General: She is not in acute distress.    Appearance: She is  well-developed.     Comments: She is alert but unkempt appearing.  She has an odor of urine.  She has some green dried material on the front of her shirt likely some cold vomit.  HENT:     Head: Normocephalic and atraumatic.  Eyes:     Conjunctiva/sclera: Conjunctivae normal.  Neck:     Musculoskeletal: Neck supple.  Cardiovascular:     Rate and Rhythm: Normal rate and regular rhythm.     Heart sounds: No murmur.  Pulmonary:     Effort: Pulmonary effort is normal. No respiratory distress.     Breath sounds: Normal breath  sounds.  Abdominal:     Palpations: Abdomen is soft.     Tenderness: There is no abdominal tenderness.  Musculoskeletal: Normal range of motion.        General: No deformity or signs of injury.  Skin:    General: Skin is warm and dry.     Capillary Refill: Capillary refill takes less than 2 seconds.  Neurological:     General: No focal deficit present.     Mental Status: She is alert.     Comments: She is awake and alert.  She follows commands somewhat.  She will answer questions sometimes appropriately and sometimes with some repetitive nonsensical words.  She is very restless in the bed and rocking from side to side and moves her legs up and down.  She does not have any focal findings.  Psychiatric:        Attention and Perception: She perceives auditory and visual hallucinations.        Mood and Affect: Affect is labile.        Speech: Speech is tangential.        Behavior: Behavior is agitated.        Thought Content: Thought content is paranoid and delusional.      ED Treatments / Results  Labs (all labs ordered are listed, but only abnormal results are displayed) Labs Reviewed  CBC WITH DIFFERENTIAL/PLATELET - Abnormal; Notable for the following components:      Result Value   WBC 17.4 (*)    Neutro Abs 13.5 (*)    Monocytes Absolute 1.3 (*)    Abs Immature Granulocytes 0.13 (*)    All other components within normal limits  COMPREHENSIVE METABOLIC  PANEL - Abnormal; Notable for the following components:   Glucose, Bld 113 (*)    Calcium 8.8 (*)    All other components within normal limits  URINALYSIS, ROUTINE W REFLEX MICROSCOPIC - Abnormal; Notable for the following components:   APPearance HAZY (*)    Ketones, ur 20 (*)    Protein, ur 30 (*)    All other components within normal limits  VALPROIC ACID LEVEL - Abnormal; Notable for the following components:   Valproic Acid Lvl <10 (*)    All other components within normal limits  CBG MONITORING, ED - Abnormal; Notable for the following components:   Glucose-Capillary 105 (*)    All other components within normal limits  SARS CORONAVIRUS 2 (HOSPITAL ORDER, [facility])  AMMONIA  PROTIME-INR  RAPID URINE DRUG SCREEN, HOSP PERFORMED  ETHANOL    EKG EKG Interpretation  Date/Time:  Sunday September 30 2018 19:35:04 EDT Ventricular Rate:  80 PR Interval:    QRS Duration: 105 QT Interval:  391 QTC Calculation: 451 R Axis:   50 Text Interpretation:  Sinus rhythm similar to prior 5/20 Confirmed by Aletta Edouard [id number]) on 09/30/2018 7:43:55 PM   Radiology Ct Head Wo Contrast  Result Date: 09/30/2018 CLINICAL DATA:  Altered LOC EXAM: CT HEAD WITHOUT CONTRAST TECHNIQUE: Contiguous axial images were obtained from the base of the skull through the vertex without intravenous contrast. COMPARISON:  07/14/2017 FINDINGS: Brain: No acute territorial infarction, hemorrhage, or intracranial mass. Mild atrophy. Minimal small vessel ischemic changes of the white matter. Stable ventricle size. Vascular: No hyperdense vessels.  Carotid vascular calcification. Skull: No fracture Sinuses/Orbits: Mucosal thickening in the sphenoid sinus. Other: None IMPRESSION: 1. No CT evidence for acute intracranial abnormality. 2. Atrophy and minimal small vessel ischemic changes  of the white matter. Electronically Signed   By: Donavan Foil M.D.   On: 09/30/2018 22:41    Procedures  Procedures (including critical care time)  Medications Ordered in ED Medications  nystatin (MYCOSTATIN/NYSTOP) topical powder ( Topical Given 10/01/18 0045)  LORazepam (ATIVAN) injection 1 mg (1 mg Intramuscular Given 09/30/18 2000)  sodium chloride 0.9 % bolus 1,000 mL (0 mLs Intravenous Stopped 09/30/18 2300)     Initial Impression / Assessment and Plan / ED Course  I have reviewed the triage vital signs and the nursing notes.  Pertinent labs & imaging results that were available during my care of the patient were reviewed by me and considered in my medical decision making (see chart for details).  Clinical Course as of Sep 30 832  Sun Sep 30, 2018  2048 Differential diagnosis includes medication noncompliance, stroke, bleed, intoxication, seizure.  On review of her prior visit she was hospitalized for what sounds like a similar episode of confusion/bizarre behavior which ultimately was felt to be psychiatric.   [MB]  2103 Patient seems more awake alert and interactive.  She still has a visual delusion that there is somebody else in the room with [place] but she is responding appropriately to me.  She is quite argumentative and states that she has been here for a few days and will not be redirected that she just got her couple hours ago.   [MB]  2246 Reevaluated patient.  She is a large likely yeast rash under her right breast.  Have ordered her some nystatin.  Patient's head CT and lab work other than a elevation in her WBC count has been unremarkable.  Tox screen negative.  She is clearly more interactive and is now more argumentative and hallucinating.  This seems not to be infectious but I think more likely to be psychiatric.  She has not been taking her psych meds as best as we can tell.  We will have TTS evaluate her.  Currently she is voluntary and does not seem an imminent threat to herself or others and was only brought here because she was disruptive.  I do not feel like she needs to be  IVC at the moment.   [MB]    Clinical Course User Index [MB] Hayden Rasmussen, MD         Final Clinical Impressions(s) / ED Diagnoses   Final diagnoses:  Confusion  Psychosis, unspecified psychosis type [facility])  Yeast dermatitis    ED Discharge Orders    None       Hayden Rasmussen, MD 10/01/18 [phone]

## 2018-09-30 NOTE — BHH Counselor (Signed)
Clinician call pt's nurse twice however phone continue to ring and was on hold. Clinician to call back.    Vertell Novak, [place], [facility], [facility] Triage Specialist [phone]

## 2018-09-30 NOTE — ED Notes (Signed)
Bed: AF79 Expected date:  Expected time:  Means of arrival:  Comments: EMS altered mental status

## 2018-09-30 NOTE — ED Notes (Signed)
Pt is experiencing auditory and visual hallucinations. Pt is talking to a bird as well as asking [place] if she sees the horse. Pt sentence structure is incomprehensible, pt is moving around in the bed. Seizure pads were placed on bed rails for pt safety.

## 2018-09-30 NOTE — ED Notes (Signed)
Pt continued visual and auditory hallucinations. Carrying on a conversation while looking off into the corner of the room. Pt states. "what do you mean? [facility], what? Ok, I hear you. You are so dumb. Preventative, preventative, preventative."

## 2018-09-30 NOTE — ED Notes (Signed)
Sent pharmacy a message to verify and send down Nystatin.

## 2018-09-30 NOTE — ED Notes (Signed)
Pt transported to CT ?

## 2018-09-30 NOTE — ED Triage Notes (Signed)
EMS reports that they were met [place]. She was seen by co-tenants to be wandering about knocking on various people's doors and "not making much sense". A bystander also mentioned that pt. "has vomited a little yesterday and today". She arrives awake, alert and confused. She uses frequent religious phraseology. She is in no distress.

## 2018-09-30 NOTE — ED Notes (Signed)
Assistance was needed to obtain COVID swab. Writer of this note and two NT assisted.

## 2018-10-01 ENCOUNTER — Emergency Department (HOSPITAL_COMMUNITY): Payer: Medicare Other

## 2018-10-01 DIAGNOSIS — F209 Schizophrenia, unspecified: Secondary | ICD-10-CM | POA: Diagnosis not present

## 2018-10-01 MED ORDER — QUETIAPINE FUMARATE 25 MG PO TABS
25.0000 mg | ORAL_TABLET | Freq: Every day | ORAL | Status: DC
  Administered 2018-10-01 – 2018-10-02 (×2): 25 mg via ORAL
  Filled 2018-10-01 (×2): qty 1

## 2018-10-01 MED ORDER — BENZTROPINE MESYLATE 1 MG PO TABS
2.0000 mg | ORAL_TABLET | Freq: Three times a day (TID) | ORAL | Status: DC
  Administered 2018-10-01 – 2018-10-04 (×9): 2 mg via ORAL
  Filled 2018-10-01 (×9): qty 2

## 2018-10-01 MED ORDER — ARIPIPRAZOLE 10 MG PO TABS
20.0000 mg | ORAL_TABLET | Freq: Every day | ORAL | Status: DC
  Administered 2018-10-01 – 2018-10-04 (×4): 20 mg
  Filled 2018-10-01 (×4): qty 2

## 2018-10-01 MED ORDER — LEVOTHYROXINE SODIUM 112 MCG PO TABS
112.0000 ug | ORAL_TABLET | Freq: Every day | ORAL | Status: DC
  Administered 2018-10-02 – 2018-10-05 (×4): 112 ug via ORAL
  Filled 2018-10-01 (×4): qty 1

## 2018-10-01 MED ORDER — MELATONIN 5 MG PO TABS
5.0000 mg | ORAL_TABLET | Freq: Every evening | ORAL | Status: DC | PRN
  Administered 2018-10-01 – 2018-10-03 (×2): 5 mg via ORAL
  Filled 2018-10-01 (×4): qty 1

## 2018-10-01 MED ORDER — VENLAFAXINE HCL ER 75 MG PO CP24
150.0000 mg | ORAL_CAPSULE | Freq: Every day | ORAL | Status: DC
  Administered 2018-10-01 – 2018-10-05 (×6): 150 mg via ORAL
  Filled 2018-10-01 (×5): qty 2

## 2018-10-01 MED ORDER — BACLOFEN 10 MG PO TABS
10.0000 mg | ORAL_TABLET | Freq: Three times a day (TID) | ORAL | Status: DC
  Administered 2018-10-01 – 2018-10-04 (×11): 10 mg via ORAL
  Filled 2018-10-01 (×11): qty 1

## 2018-10-01 MED ORDER — PANTOPRAZOLE SODIUM 40 MG PO TBEC
40.0000 mg | DELAYED_RELEASE_TABLET | Freq: Every day | ORAL | Status: DC
  Administered 2018-10-01 – 2018-10-05 (×5): 40 mg via ORAL
  Filled 2018-10-01 (×5): qty 1

## 2018-10-01 MED ORDER — ASPIRIN EC 81 MG PO TBEC
81.0000 mg | DELAYED_RELEASE_TABLET | Freq: Every day | ORAL | Status: DC
  Administered 2018-10-01 – 2018-10-05 (×5): 81 mg via ORAL
  Filled 2018-10-01 (×5): qty 1

## 2018-10-01 MED ORDER — DIVALPROEX SODIUM 250 MG PO DR TAB
250.0000 mg | DELAYED_RELEASE_TABLET | Freq: Three times a day (TID) | ORAL | Status: DC
  Administered 2018-10-01 – 2018-10-05 (×12): 250 mg via ORAL
  Filled 2018-10-01 (×12): qty 1

## 2018-10-01 MED ORDER — LORAZEPAM 1 MG PO TABS
1.0000 mg | ORAL_TABLET | Freq: Two times a day (BID) | ORAL | Status: DC | PRN
  Administered 2018-10-01 – 2018-10-04 (×5): 1 mg via ORAL
  Filled 2018-10-01 (×6): qty 1

## 2018-10-01 MED ORDER — LEVETIRACETAM 500 MG PO TABS
1000.0000 mg | ORAL_TABLET | Freq: Two times a day (BID) | ORAL | Status: DC
  Administered 2018-10-01 – 2018-10-05 (×9): 1000 mg via ORAL
  Filled 2018-10-01 (×9): qty 2

## 2018-10-01 NOTE — Consult Note (Signed)
[facility] Face-to-Face Psychiatry Consult   Reason for Consult:  Hallucinations and schioaffective  Referring Physician:  EDP Patient Identification: Dominique Acosta MRN:  [id number] Principal Diagnosis: Schizoaffective disorder, bipolar type ([place]) Diagnosis:  Principal Problem:   Schizoaffective disorder, bipolar type ([place])   Total Time spent with patient: 30 minutes  Subjective:   Dominique Acosta is a 68 y.o. female patient is speaking with "people" in the room.  She looks at the wall to the right of her and states would not you make yourself visible so that they can see you.  Patient has several other phrases that are not logical or linked together.  Conversation with patient is disorganized and irrelevant.  Objective: Patient presents sitting in the bed in the room.  Patient is apparently having hallucinations and is having conversations with people that she states are in the room and then laughs about it.  Patient makes various comments to the reported hallucinations that against the wall telling him to make themselves visible and find out they talk to [place].  Very difficult to have a conversation with this patient as most of her comments are directed at the reported hallucinations.  The conversation is irrelevant and she rambles about various objects and subjects.  HPI:  68 y.o. female, who presents voluntary and unaccompanied to [facility]. Pt was a very poor historian and answered one question during the assessment. Clinician asked the pt her name. Pt reported, "its in the chart." Pt reported, "got back home, family plagiarized book of books." Pt reported, she had an illness someone injected in her and she's dying. Pt then reported, she's already dead. Pt reported, she was sick throwing up, today. Pt reported, talking to family but they (saying while whispering) whisper, but she can still hear them. Pt talked about someone name Sam, when clinician asked who was Sam, she switched to another topic. Pt told clinician  to talk to "the man" while pointing, to the monitor (no one was there.) Pt reported, she lives with Jesus. Pt started to yell at clinician because she thought she was being asked the same questions. Pt reported, she only wanted to be asked "yes/no," questions. Clinician asked the pt "yes/no" questions she still did not answer. Pt denies, SI.  Clinician was unable to assess: if pt has legal guardian, linked to OPT resources, if taking medications from PCP, HI, stressors, family supports, history of abuse, sleep, appetite, symptoms of depression, vegetative symptoms, previous inpatient admissions, access to weapons, self-injurious behaviors, ADLs, history of violence, DSS involvement, family history of suicide would sign-in voluntary in inpatient treatment was recommended, has family/friend supports. Pt's UDS is negative.  Per [name], RN when pt first came to [facility] she was talking about Jesus, stating letters Lemmie Evens, B, J, K), asked the nurse if she was dumb, actively hallucinating. Pt's nurse reported, the pt came to the ED with feces on her feet, dirty nails, bruises ina few places and a yeast infection under her right breast. Per nurse, the pt was knocking on her neighbors doors in her apartment complex, when someone answered the door she made no sense.   Pt presents quiet, awake in hospital gown with slurred speech. Pt's eye contact was poor. Pt's mood, affect was irritable. Pt's thought process was a flight of ideas. Pt's judgement was impaired. Pt was not oriented. Pt's concentration, insight and impulse control was poor. Pt is currently responding to internal stimuli or experiencing delusional thought content.   Past Psychiatric History: Schizoaffective, Seizure disorder  Risk to Self: Suicidal Ideation: No(Pt denies. ) Suicidal Intent: No Is patient at risk for suicide?: No Suicidal Plan?: No Access to Means: (UTA) What has been your use of drugs/alcohol within the last 12 months?: UDS is negative.   Other Self Harm Risks: UTA Intentional Self Injurious Behavior: (UTA) Risk to Others: Homicidal Ideation: (UTA) Thoughts of Harm to Others: (UTA) Current Homicidal Intent: (UTA) Current Homicidal Plan: (UTA.) Access to Homicidal Means: (UTA) Identified Victim: UTA History of harm to others?: (UTA) Assessment of Violence: (UTA) Violent Behavior Description: UTA Does patient have access to weapons?: (UTA) Criminal Charges Pending?: (UTA) Does patient have a court date: (UTA) Prior Inpatient Therapy: Prior Inpatient Therapy: (UTA) Prior Outpatient Therapy: Prior Outpatient Therapy: (UTA)  Past Medical History:  Past Medical History:  Diagnosis Date  . Arm pain   . Chronic pain   . COPD (chronic obstructive pulmonary disease) ([place])   . Coronary artery disease   . Depression   . Dystonia   . Epilepsy ([place])   . High cholesterol   . Humerus fracture   . Hypothyroid   . Insomnia   . Leukemia ([place])   . Myocardial infarct ([place])   . Psychosis ([place])   . Schizophrenia ([place])   . Seizure [facility])     Past Surgical History:  Procedure Laterality Date  . BALLOON DILATION N/A 03/14/2017   Procedure: BALLOON DILATION;  Surgeon: Helayne Seminole, MD;  Location: [place];  Service: ENT;  Laterality: N/A;  . EXTERNAL EAR SURGERY    . HUMERUS FRACTURE SURGERY    . KENALOG INJECTION N/A 03/14/2017   Procedure: KENALOG INJECTION;  Surgeon: Helayne Seminole, MD;  Location: [facility];  Service: ENT;  Laterality: N/A;  . MICROLARYNGOSCOPY WITH LASER N/A 03/14/2017   Procedure: MICROLARYNGOSCOPY WITH LASER;  Surgeon: Helayne Seminole, MD;  Location[facility];  Service: ENT;  Laterality: N/A;  . SHOULDER SURGERY     Family History:  Family History  Problem Relation Age of Onset  . Cancer Mother   . Breast cancer Mother   . Cancer Father   . Breast cancer Sister   . Breast cancer Maternal Grandmother    Family Psychiatric  History: Unknown Social History:  Social History   Substance and  Sexual Activity  Alcohol Use No     Social History   Substance and Sexual Activity  Drug Use No    Social History   Socioeconomic History  . Marital status: Single    Spouse name: Not on file  . Number of children: Not on file  . Years of education: Not on file  . Highest education level: Not on file  Occupational History  . Not on file  Social Needs  . Financial resource strain: Not on file  . Food insecurity    Worry: Not on file    Inability: Not on file  . Transportation needs    Medical: Not on file    Non-medical: Not on file  Tobacco Use  . Smoking status: Former Smoker    Packs/day: 2.00    Years: 41.00    Pack years: 82.00    Types: Cigarettes  . Smokeless tobacco: Never Used  Substance and Sexual Activity  . Alcohol use: No  . Drug use: No  . Sexual activity: Never  Lifestyle  . Physical activity    Days per week: Not on file    Minutes per session: Not on file  . Stress: Not on file  Relationships  . Social Herbalist on phone: Not on file    Gets together: Not on file    Attends religious service: Not on file    Active member of club or organization: Not on file    Attends meetings of clubs or organizations: Not on file    Relationship status: Not on file  Other Topics Concern  . Not on file  Social History Narrative   Admitted to [facility] daily   Alcohol none   No Advanced Directive.      Additional Social History:    Allergies:   Allergies  Allergen Reactions  . Amoxicillin Anaphylaxis and Other (See Comments)    Childhood allergy >> tolerated cephalosporins  Has patient had a PCN reaction causing immediate rash, facial/tongue/throat swelling, SOB or lightheadedness with hypotension: Yes Has patient had a PCN reaction causing severe rash involving mucus membranes or skin necrosis: No Has patient had a PCN reaction that required hospitalization No Has patient had a PCN reaction occurring  within the last 10 years: No If all of the above answers are "NO", then may proceed with Cephalosporin use.  Marland Kitchen Penicillins Anaphylaxis and Other (See Comments)    Childhood allergy Has patient had a PCN reaction causing immediate rash, facial/tongue/throat swelling, SOB or lightheadedness with hypotension: Yes Has patient had a PCN reaction causing severe rash involving mucus membranes or skin necrosis: No Has patient had a PCN reaction that required hospitalization No Has patient had a PCN reaction occurring within the last 10 years: No If all of the above answers are "NO", then may proceed with Cephalosporin use.   Marland Kitchen Phenytoin Other (See Comments)    = CNS disorder   . Keflex [Cephalexin] Diarrhea  . Dilaudid [Hydromorphone Hcl] Other (See Comments)    Reaction:  Psychosis   . Haldol [Haloperidol] Other (See Comments)    Reaction:  Psychosis   . Morphine And Related Other (See Comments)    Reaction:  Psychosis     Labs:  Results for orders placed or performed during the hospital encounter of 09/30/18 (from the past 48 hour(s))  CBC with Differential     Status: Abnormal   Collection Time: 09/30/18  8:25 PM  Result Value Ref Range   WBC 17.4 (H) 4.0 - 10.5 K/uL   RBC 4.24 3.87 - 5.11 MIL/uL   Hemoglobin 12.7 12.0 - 15.0 g/dL   HCT 40.7 36.0 - 46.0 %   MCV 96.0 80.0 - 100.0 fL   MCH 30.0 26.0 - 34.0 pg   MCHC 31.2 30.0 - 36.0 g/dL   RDW 13.2 11.5 - 15.5 %   Platelets 312 150 - 400 K/uL   nRBC 0.0 0.0 - 0.2 %   Neutrophils Relative % 78 %   Neutro Abs 13.5 (H) 1.7 - 7.7 K/uL   Lymphocytes Relative 14 %   Lymphs Abs 2.4 0.7 - 4.0 K/uL   Monocytes Relative 7 %   Monocytes Absolute 1.3 (H) 0.1 - 1.0 K/uL   Eosinophils Relative 0 %   Eosinophils Absolute 0.0 0.0 - 0.5 K/uL   Basophils Relative 0 %   Basophils Absolute 0.1 0.0 - 0.1 K/uL   Immature Granulocytes 1 %   Abs Immature Granulocytes 0.13 (H) 0.00 - 0.07 K/uL    Comment: Performed at [facility],  [place] [address] [place], [place] [address]  Comprehensive metabolic panel     Status:  Abnormal   Collection Time: 09/30/18  8:25 PM  Result Value Ref Range   Sodium 140 135 - 145 mmol/L   Potassium 3.7 3.5 - 5.1 mmol/L   Chloride 103 98 - 111 mmol/L   CO2 27 22 - 32 mmol/L   Glucose, Bld 113 (H) 70 - 99 mg/dL   BUN 17 8 - 23 mg/dL   Creatinine, Ser 0.71 0.44 - 1.00 mg/dL   Calcium 8.8 (L) 8.9 - 10.3 mg/dL   Total Protein 6.8 6.5 - 8.1 g/dL   Albumin 3.7 3.5 - 5.0 g/dL   AST 17 15 - 41 U/L   ALT 18 0 - 44 U/L   Alkaline Phosphatase 70 38 - 126 U/L   Total Bilirubin 0.5 0.3 - 1.2 mg/dL   GFR calc non Af Amer >60 >60 mL/min   GFR calc Af Amer >60 >60 mL/min   Anion gap 10 5 - 15    Comment: Performed at [facility], [place] [address], [place], [place] [address]  Urinalysis, Routine w reflex microscopic     Status: Abnormal   Collection Time: 09/30/18  8:25 PM  Result Value Ref Range   Color, Urine YELLOW YELLOW   APPearance HAZY (A) CLEAR   Specific Gravity, Urine 1.028 1.005 - 1.030   pH 6.0 5.0 - 8.0   Glucose, UA NEGATIVE NEGATIVE mg/dL   Hgb urine dipstick NEGATIVE NEGATIVE   Bilirubin Urine NEGATIVE NEGATIVE   Ketones, ur 20 (A) NEGATIVE mg/dL   Protein, ur 30 (A) NEGATIVE mg/dL   Nitrite NEGATIVE NEGATIVE   Leukocytes,Ua NEGATIVE NEGATIVE   RBC / HPF 0-5 0 - 5 RBC/hpf   WBC, UA 6-10 0 - 5 WBC/hpf   Bacteria, UA NONE SEEN NONE SEEN   Squamous Epithelial / LPF 0-5 0 - 5   Mucus PRESENT    Hyaline Casts, UA PRESENT     Comment: Performed at [facility], [place] [address] [place], [place] [address]  Ammonia     Status: None   Collection Time: 09/30/18  8:25 PM  Result Value Ref Range   Ammonia 13 9 - 35 umol/L    Comment: Performed at [facility], [place] [address], [place], [place] [address]  Protime-INR     Status: None   Collection Time: 09/30/18  8:25 PM  Result Value Ref Range   Prothrombin Time 12.9 11.4 -  15.2 seconds   INR 1.0 0.8 - 1.2    Comment: (NOTE) INR goal varies based on device and disease states. Performed at [facility], [place] [address] [place], [place] [address]   Valproic acid level     Status: Abnormal   Collection Time: 09/30/18  8:25 PM  Result Value Ref Range   Valproic Acid Lvl <10 (L) 50.0 - 100.0 ug/mL    Comment: RESULTS CONFIRMED BY MANUAL DILUTION Performed at [place] [address], [place], [place] [address]   Urine rapid drug screen (hosp performed)     Status: None   Collection Time: 09/30/18  8:25 PM  Result Value Ref Range   Opiates NONE DETECTED NONE DETECTED   Cocaine NONE DETECTED NONE DETECTED   Benzodiazepines NONE DETECTED NONE DETECTED   Amphetamines NONE DETECTED NONE DETECTED   Tetrahydrocannabinol NONE DETECTED NONE DETECTED   Barbiturates NONE DETECTED NONE DETECTED    Comment: (NOTE) DRUG SCREEN FOR MEDICAL PURPOSES ONLY.  IF CONFIRMATION IS NEEDED FOR ANY PURPOSE, NOTIFY LAB  WITHIN 5 DAYS. LOWEST DETECTABLE LIMITS FOR URINE DRUG SCREEN Drug Class                     Cutoff (ng/mL) Amphetamine and metabolites    1000 Barbiturate and metabolites    200 Benzodiazepine                 010 Tricyclics and metabolites     300 Opiates and metabolites        300 Cocaine and metabolites        300 THC                            50 Performed at [facility], [place] [address], [place], [place] [address]   Ethanol     Status: None   Collection Time: 09/30/18  8:25 PM  Result Value Ref Range   Alcohol, Ethyl (B) <10 <10 mg/dL    Comment: (NOTE) Lowest detectable limit for serum alcohol is 10 mg/dL. For medical purposes only. Performed at [facility], [place] [address], [place], [place] [address]   CBG monitoring, ED     Status: Abnormal   Collection Time: 09/30/18  8:31 PM  Result Value Ref Range   Glucose-Capillary 105 (H) 70 - 99 mg/dL  SARS Coronavirus 2  (CEPHEID - Performed in [place] hospital lab), Hosp Order     Status: None   Collection Time: 09/30/18  9:56 PM   Specimen: Nasopharyngeal Swab  Result Value Ref Range   SARS Coronavirus 2 NEGATIVE NEGATIVE    Comment: (NOTE) If result is NEGATIVE SARS-CoV-2 target nucleic acids are NOT DETECTED. The SARS-CoV-2 RNA is generally detectable in upper and lower  respiratory specimens during the acute phase of infection. The lowest  concentration of SARS-CoV-2 viral copies this assay can detect is 250  copies / mL. A negative result does not preclude SARS-CoV-2 infection  and should not be used as the sole basis for treatment or other  patient management decisions.  A negative result may occur with  improper specimen collection / handling, submission of specimen other  than nasopharyngeal swab, presence of viral mutation(s) within the  areas targeted by this assay, and inadequate number of viral copies  (<250 copies / mL). A negative result must be combined with clinical  observations, patient history, and epidemiological information. If result is POSITIVE SARS-CoV-2 target nucleic acids are DETECTED. The SARS-CoV-2 RNA is generally detectable in upper and lower  respiratory specimens dur ing the acute phase of infection.  Positive  results are indicative of active infection with SARS-CoV-2.  Clinical  correlation with patient history and other diagnostic information is  necessary to determine patient infection status.  Positive results do  not rule out bacterial infection or co-infection with other viruses. If result is PRESUMPTIVE POSTIVE SARS-CoV-2 nucleic acids MAY BE PRESENT.   A presumptive positive result was obtained on the submitted specimen  and confirmed on repeat testing.  While 2019 novel coronavirus  (SARS-CoV-2) nucleic acids may be present in the submitted sample  additional confirmatory testing may be necessary for epidemiological  and / or clinical management  purposes  to differentiate between  SARS-CoV-2 and other Sarbecovirus currently known to infect humans.  If clinically indicated additional testing with an alternate test  methodology [phone]) is advised. The SARS-CoV-2 RNA is generally  detectable in upper and lower respiratory sp ecimens during the acute  phase of infection. The expected result is Negative. Fact Sheet for Patients:  StrictlyIdeas.no Fact Sheet for Healthcare Providers: BankingDealers.co.za This test is not yet approved or cleared by the [place] FDA and has been authorized for detection and/or diagnosis of SARS-CoV-2 by FDA under an Emergency Use Authorization (EUA).  This EUA will remain in effect (meaning this test can be used) for the duration of the COVID-19 declaration under Section 564(b)(1) of the Act, 21 U.S.C. section 360bbb-3(b)(1), unless the authorization is terminated or revoked sooner. Performed at [facility], [place] [address] [place], [place] [address]     Current Facility-Administered Medications  Medication Dose Route Frequency Provider Last Rate Last Dose  . nystatin (MYCOSTATIN/NYSTOP) topical powder   Topical BID Hayden Rasmussen, MD       Current Outpatient Medications  Medication Sig Dispense Refill  . ARIPiprazole (ABILIFY) 20 MG tablet Place 1 tablet (20 mg total) into feeding tube daily. 30 tablet 0  . ARIPiprazole ER (ABILIFY MAINTENA) 400 MG PRSY prefilled syringe Inject 400 mg into the muscle every 28 (twenty-eight) days.    Marland Kitchen aspirin EC 81 MG tablet Take 81 mg by mouth daily.    . baclofen (LIORESAL) 10 MG tablet Take 10 mg by mouth 3 (three) times daily.    . benztropine (COGENTIN) 2 MG tablet Take 1 tablet (2 mg total) by mouth 3 (three) times daily. 90 tablet 0  . divalproex (DEPAKOTE) 250 MG DR tablet Take 1 tablet (250 mg total) by mouth every 8 (eight) hours. 90 tablet 0  . ipratropium-albuterol (DUONEB)  0.5-2.5 (3) MG/3ML SOLN Take 3 mLs by nebulization 2 (two) times daily. (Patient taking differently: Take 3 mLs by nebulization every 6 (six) hours as needed (SOB, wheezing). ) 360 mL 0  . ketoconazole (NIZORAL) 2 % cream Apply 1 application topically 2 (two) times daily. (Patient taking differently: Apply 1 application topically 2 (two) times daily as needed for irritation. ) 60 g 1  . levETIRAcetam (KEPPRA) 1000 MG tablet Take 1 tablet (1,000 mg total) by mouth 2 (two) times daily. 60 tablet 0  . levothyroxine (SYNTHROID) 112 MCG tablet Take 112 mcg by mouth daily.    Marland Kitchen LORazepam (ATIVAN) 1 MG tablet Take 1 mg by mouth 2 (two) times daily as needed for anxiety.    . Melatonin 5 MG TABS Take 5 mg by mouth at bedtime as needed (sleep).    . meloxicam (MOBIC) 7.5 MG tablet Take 15 mg by mouth daily as needed for pain.    Marland Kitchen nystatin (NYSTATIN) powder Apply 4 g topically 2 (two) times daily as needed (yeast).    Marland Kitchen omeprazole (PRILOSEC) 20 MG capsule Take 20 mg by mouth daily.     . ondansetron (ZOFRAN-ODT) 4 MG disintegrating tablet Take 4 mg by mouth every 8 (eight) hours as needed for nausea or vomiting.    Marland Kitchen QUEtiapine (SEROQUEL) 25 MG tablet Take 25 mg by mouth at bedtime.    . traMADol (ULTRAM) 50 MG tablet Take 50 mg by mouth every 12 (twelve) hours as needed for moderate pain or severe pain.    . valACYclovir (VALTREX) 1000 MG tablet Take 1,000 mg by mouth 2 (two) times daily as needed (outbreak).    . venlafaxine XR (EFFEXOR-XR) 150 MG 24 hr capsule Take 150 mg by mouth daily.     . folic acid (FOLVITE) 1 MG tablet Place 1 tablet (1 mg total) into feeding tube daily. (Patient not taking: Reported on  10/01/2018) 30 tablet 0  . promethazine (PHENERGAN) 25 MG tablet Take 1 tablet (25 mg total) by mouth every 6 (six) hours as needed for up to 12 doses for nausea or vomiting. (Patient not taking: Reported on 10/01/2018) 12 tablet 0    Musculoskeletal: Strength & Muscle Tone: decreased Gait &  Station: unsteady Patient leans: N/A  Psychiatric Specialty Exam: Physical Exam  Nursing note and vitals reviewed. Constitutional: She is oriented to person, place, and time. She appears well-developed and well-nourished.  Cardiovascular: Normal rate.  Respiratory: Effort normal.  Musculoskeletal: Normal range of motion.  Neurological: She is alert and oriented to person, place, and time.  Skin: Skin is warm.    Review of Systems  Constitutional: Negative.   HENT: Negative.   Eyes: Negative.   Respiratory: Negative.   Cardiovascular: Negative.   Gastrointestinal: Negative.   Genitourinary: Negative.   Musculoskeletal: Negative.   Skin: Negative.   Neurological: Negative.   Endo/Heme/Allergies: Negative.   Psychiatric/Behavioral: Positive for hallucinations.    Blood pressure 109/89, pulse 76, temperature 100.3 F (37.9 C), temperature source Rectal, resp. rate (!) 24, SpO2 100 %.There is no height or weight on file to calculate BMI.  General Appearance: Disheveled  Eye Contact:  Good  Speech:  Pressured  Volume:  Normal  Mood:  Euthymic  Affect:  Congruent  Thought Process:  Disorganized, Irrelevant and Descriptions of Associations: Circumstantial  Orientation:  Other:  Not oriented  Thought Content:  Illogical and Hallucinations: Auditory Visual  Suicidal Thoughts:  No  Homicidal Thoughts:  No  Memory:  Immediate;   Poor Recent;   Poor Remote;   Poor  Judgement:  Impaired  Insight:  Lacking  Psychomotor Activity:  Normal  Concentration:  Concentration: Poor and Attention Span: Poor  Recall:  Poor  Fund of Knowledge:  Poor  Language:  Fair  Akathisia:  No  Handed:  Right  AIMS (if indicated):     Assets:  Financial Resources/Insurance Housing Resilience  ADL's:  Impaired  Cognition:  Impaired,  Moderate  Sleep:        Treatment Plan Summary: Daily contact with patient to assess and evaluate symptoms and progress in treatment and Medication  management  Disposition: Recommend gero-psychiatric Inpatient admission when medically cleared.  [place], FNP 10/01/2018 12:15 PM

## 2018-10-01 NOTE — BH Assessment (Signed)
[facility] Assessment Progress Note  Per Hampton Abbot, MD, this pt requires psychiatric hospitalization at this time.  EDP Francine Graven, DO, concurs with this decision and also finds that pt meets criteria for IVC, which she has initiated.  The following facilities have been contacted to seek placement for this pt, with results as noted:  Beds available, information sent, decision pending:  [facility]   At capacity:  [facility]   Unable to reach:  Elly Modena [place], [place] Coordinator [phone]

## 2018-10-01 NOTE — ED Notes (Signed)
TTS at bedside. 

## 2018-10-01 NOTE — ED Notes (Signed)
Son called her after 2100 but allowed her to use the phone to speak with him. She has napped on and off this shift. When awake she is calm but delusional and talking to her self making statements ie "God is not real". No behavior issues. Sitter at her doorway to monitor for safety.

## 2018-10-01 NOTE — ED Notes (Signed)
Patient provided with lunch tray. Patient denies other needs at this time.

## 2018-10-01 NOTE — ED Notes (Signed)
Patient provided with breakfast tray. Patient resting quietly. Denies needs at this time.

## 2018-10-01 NOTE — ED Notes (Signed)
Responding to internal stimuli, having conversations. Writer offered to turn her TV on for, she accepted and asked writer to turn the volume up "so they can hear" and moved her head to indicate people to her left when no one but myself and her in the room. Sitter at door for her safety.

## 2018-10-01 NOTE — ED Notes (Signed)
Per TTS, pt meets criteria for placement. Pt needs to be medically cleared before a disposition can be given.

## 2018-10-01 NOTE — ED Notes (Signed)
Patient yelling at the wall, seemingly arguing with someone called "Timothy." Patient throwing items off of her tray at the wall and growling at the wall. Patient cannot be redirected at this time. Patient not violent or angry at staff, but verbalizes aggression toward "Timothy."

## 2018-10-01 NOTE — ED Notes (Signed)
Patient arrived to room 32.  Belongings placed in locker 54.  Patient calm and cooperative when she arrived.  Assessed patient's level of orientation.  Patient knows her name, that she lives in [place] and was able to state the month as being June.  Patient appears to be oriented at times but then will start rambling with delusional, incoherent thoughts.  Patient asked for water and something to eat upon admission.  Reassured patient and her needs of food and fluids were met.

## 2018-10-01 NOTE — BH Assessment (Addendum)
Tele Assessment Note   Patient Name: Dominique Acosta MRN: [id number] Referring Physician: Dr. Aletta Edouard. Location of Patient: [facility] Q[facility]. Location of Provider: [place]  Dominique Acosta is an 68 y.o. female, who presents voluntary and unaccompanied to [facility]. Pt was a very poor historian and answered one question during the assessment. Clinician asked the pt her name. Pt reported, "its in the chart." Pt reported, "got back home, family plagiarized book of books." Pt reported, she had an illness someone injected in her and she's dying. Pt then reported, she's already dead. Pt reported, she was sick throwing up, today. Pt reported, talking to family but they (saying while whispering) whisper, but she can still hear them. Pt talked about someone name Sam, when clinician asked who was Sam, she switched to another topic. Pt told clinician to talk to "the man" while pointing, to the monitor (no one was there.) Pt reported, she lives with Jesus. Pt started to yell at clinician because she thought she was being asked the same questions. Pt reported, she only wanted to be asked "yes/no," questions. Clinician asked the pt "yes/no" questions she still did not answer. Pt denies, SI.   Clinician was unable to assess: if pt has legal guardian, linked to OPT resources, if taking medications from PCP, HI, stressors, family supports, history of abuse, sleep, appetite, symptoms of depression, vegetative symptoms, previous inpatient admissions, access to weapons, self-injurious behaviors, ADLs, history of violence, DSS involvement, family history of suicide would sign-in voluntary in inpatient treatment was recommended, has family/friend supports. Pt's UDS is negative.   Per [name], RN when pt first came to [facility] she was talking about Jesus, stating letters Lemmie Evens, B, J, K), asked the nurse if she was dumb, actively hallucinating. Pt's nurse reported, the pt came to the ED with feces on her feet,  dirty nails, bruises ina few places and a yeast infection under her right breast. Per nurse, the pt was knocking on her neighbors doors in her apartment complex, when someone answered the door she made no sense.    Pt presents quiet, awake in hospital gown with slurred speech. Pt's eye contact was poor. Pt's mood, affect was irritable. Pt's thought process was a flight of ideas. Pt's judgement was impaired. Pt was not oriented. Pt's concentration, insight and impulse control was poor. Pt is currently responding to internal stimuli or experiencing delusional thought content.   Diagnosis: Schizophrenia.  Past Medical History:  Past Medical History:  Diagnosis Date  . Arm pain   . Chronic pain   . COPD (chronic obstructive pulmonary disease) ([place])   . Coronary artery disease   . Depression   . Dystonia   . Epilepsy ([place])   . High cholesterol   . Humerus fracture   . Hypothyroid   . Insomnia   . Leukemia ([place])   . Myocardial infarct ([place])   . Psychosis ([place])   . Schizophrenia ([place])   . Seizure [facility])     Past Surgical History:  Procedure Laterality Date  . BALLOON DILATION N/A 03/14/2017   Procedure: BALLOON DILATION;  Surgeon: Helayne Seminole, MD;  Location: [place];  Service: ENT;  Laterality: N/A;  . EXTERNAL EAR SURGERY    . HUMERUS FRACTURE SURGERY    . KENALOG INJECTION N/A 03/14/2017   Procedure: KENALOG INJECTION;  Surgeon: Helayne Seminole, MD;  Location: [place];  Service: ENT;  Laterality: N/A;  . MICROLARYNGOSCOPY WITH LASER N/A 03/14/2017   Procedure: MICROLARYNGOSCOPY  WITH LASER;  Surgeon: Helayne Seminole, MD;  Location: [facility] OR;  Service: ENT;  Laterality: N/A;  . SHOULDER SURGERY      Family History:  Family History  Problem Relation Age of Onset  . Cancer Mother   . Breast cancer Mother   . Cancer Father   . Breast cancer Sister   . Breast cancer Maternal Grandmother     Social History:  reports that she has quit smoking. Her smoking use included  cigarettes. She has a 82.00 pack-year smoking history. She has never used smokeless tobacco. She reports that she does not drink alcohol or use drugs.  Additional Social History:  Alcohol / Drug Use Pain Medications: See MAR Prescriptions: See MAR Over the Counter: See MAR History of alcohol / drug use?: No history of alcohol / drug abuse  CIWA: CIWA-Ar BP: (!) 126/92 Pulse Rate: 77 COWS:    Allergies:  Allergies  Allergen Reactions  . Amoxicillin Anaphylaxis and Other (See Comments)    Childhood allergy >> tolerated cephalosporins  Has patient had a PCN reaction causing immediate rash, facial/tongue/throat swelling, SOB or lightheadedness with hypotension: Yes Has patient had a PCN reaction causing severe rash involving mucus membranes or skin necrosis: No Has patient had a PCN reaction that required hospitalization No Has patient had a PCN reaction occurring within the last 10 years: No If all of the above answers are "NO", then may proceed with Cephalosporin use.  Marland Kitchen Penicillins Anaphylaxis and Other (See Comments)    Childhood allergy Has patient had a PCN reaction causing immediate rash, facial/tongue/throat swelling, SOB or lightheadedness with hypotension: Yes Has patient had a PCN reaction causing severe rash involving mucus membranes or skin necrosis: No Has patient had a PCN reaction that required hospitalization No Has patient had a PCN reaction occurring within the last 10 years: No If all of the above answers are "NO", then may proceed with Cephalosporin use.   Marland Kitchen Phenytoin Other (See Comments)    = CNS disorder   . Keflex [Cephalexin] Diarrhea  . Dilaudid [Hydromorphone Hcl] Other (See Comments)    Reaction:  Psychosis   . Haldol [Haloperidol] Other (See Comments)    Reaction:  Psychosis   . Morphine And Related Other (See Comments)    Reaction:  Psychosis     Home Medications: (Not in a hospital admission)   OB/GYN Status:  No LMP recorded. Patient is  postmenopausal.  General Assessment Data Assessment unable to be completed: Yes Reason for not completing assessment: Clinician call pt's nurse twice however phone continue to ring and was on hold. Clinician to call back [facility] of Assessment: [facility] TTS Assessment: In system Is this a Tele or Face-to-Face Assessment?: Tele Assessment Is this an Initial Assessment or a Re-assessment for this encounter?: Initial Assessment Patient Accompanied by:: N/A Language Other than English: No Living Arrangements: Other (Comment)(Alone. ) Marital status: (UTA) Living Arrangements: Alone Can pt return to current living arrangement?: Yes Admission Status: Voluntary Is patient capable of signing voluntary admission?: Yes Referral Source: Self/Family/Friend Insurance type: [employer].      Crisis Care Plan Living Arrangements: Alone Legal Guardian: (UTA) Name of Psychiatrist: [place] Name of Therapist: UTA  Education Status Is patient currently in school?: No Is the patient employed, unemployed or receiving disability?: (UTA)  Risk to self with the past 6 months Suicidal Ideation: No(Pt denies. ) Has patient been a risk to self within the past 6 months prior to admission? : No Suicidal  Intent: No Has patient had any suicidal intent within the past 6 months prior to admission? : No Is patient at risk for suicide?: No Suicidal Plan?: No Has patient had any suicidal plan within the past 6 months prior to admission? : No Access to Means: (UTA) What has been your use of drugs/alcohol within the last 12 months?: UDS is negative.  Previous Attempts/Gestures: (UTA) Other Self Harm Risks: UTA Intentional Self Injurious Behavior: (UTA) Family Suicide History: Unable to assess Recent stressful life event(s): (UTA) Persecutory voices/beliefs?: Pincus Badder) Depression: (UTA) Substance abuse history and/or treatment for substance abuse?: (UTA) Suicide prevention information given to  non-admitted patients: Not applicable  Risk to Others within the past 6 months Homicidal Ideation: (UTA) Does patient have any lifetime risk of violence toward others beyond the six months prior to admission? : (UTA) Thoughts of Harm to Others: (UTA) Current Homicidal Intent: (UTA) Current Homicidal Plan: (UTA.) Access to Homicidal Means: (UTA) Identified Victim: UTA History of harm to others?: (UTA) Assessment of Violence: (UTA) Violent Behavior Description: UTA Does patient have access to weapons?: (UTA) Criminal Charges Pending?: (UTA) Does patient have a court date: (UTA) Is patient on probation?: (UTA)  Psychosis Hallucinations: Auditory, Visual Delusions: Unspecified  Mental Status Report Appearance/Hygiene: In hospital gown Eye Contact: Poor Motor Activity: Unremarkable Speech: Slurred Level of Consciousness: Quiet/awake Mood: Irritable Affect: Irritable Anxiety Level: None Thought Processes: Flight of Ideas Judgement: Impaired Orientation: Not oriented Obsessive Compulsive Thoughts/Behaviors: None  Cognitive Functioning Concentration: Poor Memory: Recent Impaired, Remote Impaired Is patient IDD: No Insight: Poor Impulse Control: Poor Appetite: (UTA) Sleep: Unable to Assess Vegetative Symptoms: Unable to Assess  ADLScreening [facility]) Patient's cognitive ability adequate to safely complete daily activities?: Yes Patient able to express need for assistance with ADLs?: No Independently performs ADLs?: (UTA)  Prior Inpatient Therapy Prior Inpatient Therapy: (UTA)  Prior Outpatient Therapy Prior Outpatient Therapy: (UTA)  ADL Screening (condition at time of admission) Patient's cognitive ability adequate to safely complete daily activities?: Yes Is the patient deaf or have difficulty hearing?: No Does the patient have difficulty seeing, even when wearing glasses/contacts?: (UTA) Does the patient have difficulty concentrating,  remembering, or making decisions?: Yes Patient able to express need for assistance with ADLs?: No Does the patient have difficulty dressing or bathing?: (UTA) Independently performs ADLs?: (UTA) Does the patient have difficulty walking or climbing stairs?: (UTA) Weakness of Legs: (UTA) Weakness of Arms/Hands: (UTA)  Home Assistive Devices/Equipment Home Assistive Devices/Equipment: (UTA)    Abuse/Neglect Assessment (Assessment to be complete while patient is alone) Abuse/Neglect Assessment Can Be Completed: Unable to assess, patient is non-responsive or altered mental status     Advance Directives (For Healthcare) Does Patient Have a Medical Advance Directive?: Unable to assess, patient is non-responsive or altered mental status          Disposition: Darlyne Russian, PA recommends inpatient treatment once pt is medically cleared. Per Shana Chute, RN no appropriate beds available. Discussed with [name], RN.  RN to discuss with ED provider. TTS to seek placement.    Disposition Initial Assessment Completed for this Encounter: Yes  This service was provided via telemedicine using a 2-way, interactive audio and video technology.  Names of all persons participating in this telemedicine service and their role in this encounter. Name: Shala Baumbach. Role: Patient.   Name: Vertell Novak, MS, [facility], [place]. Role: Counselor.          Vertell Novak 10/01/2018 12:52 AM    Vertell Novak,  [place], [facility], [place], [place] Triage Specialist [phone]

## 2018-10-01 NOTE — ED Notes (Addendum)
Patient is alert and oriented x3 but is hallucinating almost constantly with conversation directed at unseen persons.  Patient sometimes yells at these voices also.  She will break away and converse with nurse and express her needs then continue her conversations.  Patient reported to nurse that it is difficult to meet her needs of food and medication.  She lives alone.

## 2018-10-01 NOTE — ED Notes (Signed)
Went in to check on the pt. Pt awoke while in the room. Pt requested TV to be turned on and change it to the [employer] channel.

## 2018-10-01 NOTE — ED Notes (Signed)
Pt is actively experiencing auditory and visual hallucinations. Pt states, "I am dying. Jimmy did you hear that? You are retched. Will you move closer, I need to hear you. Retched, retched, retched, retched, retched, retched, L, K, P, H, retched, retched, retched, retched."

## 2018-10-01 NOTE — ED Notes (Signed)
Ativan prn given as ordered for her ongoing talking to self, restlessness and inability to relax so she can sleep.

## 2018-10-02 DIAGNOSIS — F209 Schizophrenia, unspecified: Secondary | ICD-10-CM | POA: Diagnosis not present

## 2018-10-02 DIAGNOSIS — F29 Unspecified psychosis not due to a substance or known physiological condition: Secondary | ICD-10-CM

## 2018-10-02 DIAGNOSIS — F250 Schizoaffective disorder, bipolar type: Secondary | ICD-10-CM

## 2018-10-02 DIAGNOSIS — R4182 Altered mental status, unspecified: Secondary | ICD-10-CM

## 2018-10-02 MED ORDER — STERILE WATER FOR INJECTION IJ SOLN
INTRAMUSCULAR | Status: AC
  Administered 2018-10-02: 04:00:00
  Filled 2018-10-02: qty 10

## 2018-10-02 MED ORDER — ZIPRASIDONE MESYLATE 20 MG IM SOLR
10.0000 mg | Freq: Once | INTRAMUSCULAR | Status: AC
  Administered 2018-10-02: 04:00:00 10 mg via INTRAMUSCULAR
  Filled 2018-10-02: qty 20

## 2018-10-02 MED ORDER — STERILE WATER FOR INJECTION IJ SOLN
INTRAMUSCULAR | Status: AC
  Administered 2018-10-02: 13:00:00
  Filled 2018-10-02: qty 10

## 2018-10-02 MED ORDER — ZIPRASIDONE MESYLATE 20 MG IM SOLR
10.0000 mg | Freq: Once | INTRAMUSCULAR | Status: AC
  Administered 2018-10-02: 13:00:00 10 mg via INTRAMUSCULAR
  Filled 2018-10-02: qty 20

## 2018-10-02 MED ORDER — OLANZAPINE 5 MG PO TBDP
5.0000 mg | ORAL_TABLET | Freq: Once | ORAL | Status: AC
  Administered 2018-10-02: 5 mg via ORAL
  Filled 2018-10-02: qty 1

## 2018-10-02 NOTE — ED Notes (Signed)
Pt sleep unable to assess at this time. Pt sitter noted outside pt door. Pt safe will continue to monitor.

## 2018-10-02 NOTE — ED Notes (Signed)
Physician aware of elevated WBC.  It appears chronic with no other infection concerns per EDP.

## 2018-10-02 NOTE — BH Assessment (Signed)
[place] Assessment Progress Note  Per Myles Lipps, MD, this pt continues to require psychiatric hospitalization at a facility providing specialty geriatric care.  Pt remains under IVC initiated by EDP Francine Graven, DO.  The following facilities have been contacted to seek placement for this pt, with results as noted:  Beds available, information sent, decision pending:  [facility] (remains under review) [facility] (remains under review)   At capacity:  [place] to reach:  [place]   [place], [place] Coordinator [phone]

## 2018-10-02 NOTE — ED Notes (Signed)
Consulted Dr for a prn for her psychosis and agitation. Zyprexa given as ordered.

## 2018-10-02 NOTE — ED Notes (Signed)
Pt alert with periods of confusion. Pt denies any si or hi. Pt c/o of hallucinations she reports her family telling her to do things. Pt speaking to voices and needs constant redirection. Pt sitter outside of the room. Pt safe will continue to monitor.

## 2018-10-02 NOTE — ED Notes (Signed)
[facility] request a repeat WBC to consider accepting pt.

## 2018-10-02 NOTE — ED Notes (Signed)
Awake and loud, cursing staff, demanding breakfast, insisting her breakfast is here. Continues to respond to stimuli unseen by staff. Unable to redirect. Consulted with Dr Leonides Schanz, order given of IM geodon which she took without difficulty. Gave her a snack of graham crackers but it did not immediately calm her. Continues to be loud and irritable.

## 2018-10-02 NOTE — ED Provider Notes (Signed)
3:40 AM  Pt increasingly agitated and responding to internal stimuli.  Patient has oral Ativan ordered as needed.  Nursing staff felt like this was actually making patient's symptoms worse.  She was given ODT Zyprexa initially and this seemed to help significantly but now more awake, combative, cursing.  She is allergic to Haldol.  Will give dose of IM Geodon.  She is awaiting Geri psychiatric placement.   Ward, Delice Bison, DO 10/02/18 [phone]

## 2018-10-02 NOTE — ED Notes (Signed)
Pt given 10 mg IM  of Geodon, medication compliant.

## 2018-10-02 NOTE — ED Notes (Signed)
Pt restless, agitated, pt delusional, pt upset with people being around her, pt talking to ppl not there. Pt uncooperative with redirection at times. Pt mediation compliant.

## 2018-10-02 NOTE — Consult Note (Signed)
Telepsych Consultation   Reason for Consult:  psychotic Referring Physician:  ED Location of Patient:  Location of Provider: [facility]  Patient Identification: Dominique Acosta MRN:  [id number] Principal Diagnosis: Schizoaffective disorder, bipolar type ([place]) Diagnosis:  Principal Problem:   Schizoaffective disorder, bipolar type ([place])   Total Time spent with patient: 30 minutes  Subjective:   Dominique Acosta is a 68 y.o. female patient admitted with PPHx significant for schizoaffective disorder.  HPI:  Patient is seen and examined. Patient is a 68 YO female with a PPHx seen on consultation in [place]. Patient was brought to ED by herself. History provided by patient is not fully understandable secondary to her disorganzied state. During the interval she displays evidence of paranoia, auditory hallucinations and hyperreligious. Her compliance with her medications is also suspect. The EMR revealed her last psychiatric admission was in December 2019. At that time she had been non-compliant with medications and was in a similar condition. It was also noted that she has had several psychiatric admissions in the past and had been diagnosed with schizophrenia vs schizoaffective disorder. That list of meds was Abilify, cogentin, depakote, folic acid, keppra, levothyroxine, lorazepam and seroquel. She did re4ceive some seroquel while she is in the ED.  Past Psychiatric History: several previous psychiatric hospitalizations with the last in our EMR was December of 2019.Marland Kitchen She has been diagnosed with schizophrenia vs schizoaffective disorder.  Risk to Self: Suicidal Ideation: No(Pt denies. ) Suicidal Intent: No Is patient at risk for suicide?: No Suicidal Plan?: No Access to Means: (UTA) What has been your use of drugs/alcohol within the last 12 months?: UDS is negative.  Other Self Harm Risks: UTA Intentional Self Injurious Behavior: (UTA) Risk to Others: Homicidal Ideation: (UTA) Thoughts  of Harm to Others: (UTA) Current Homicidal Intent: (UTA) Current Homicidal Plan: (UTA.) Access to Homicidal Means: (UTA) Identified Victim: UTA History of harm to others?: (UTA) Assessment of Violence: (UTA) Violent Behavior Description: UTA Does patient have access to weapons?: (UTA) Criminal Charges Pending?: (UTA) Does patient have a court date: (UTA) Prior Inpatient Therapy: Prior Inpatient Therapy: (UTA) Prior Outpatient Therapy: Prior Outpatient Therapy: (UTA)  Past Medical History:  Past Medical History:  Diagnosis Date  . Arm pain   . Chronic pain   . COPD (chronic obstructive pulmonary disease) ([place])   . Coronary artery disease   . Depression   . Dystonia   . Epilepsy ([place])   . High cholesterol   . Humerus fracture   . Hypothyroid   . Insomnia   . Leukemia ([place])   . Myocardial infarct ([place])   . Psychosis ([place])   . Schizophrenia ([place])   . Seizure [facility])     Past Surgical History:  Procedure Laterality Date  . BALLOON DILATION N/A 03/14/2017   Procedure: BALLOON DILATION;  Surgeon: Helayne Seminole, MD;  Location: [facility];  Service: ENT;  Laterality: N/A;  . EXTERNAL EAR SURGERY    . HUMERUS FRACTURE SURGERY    . KENALOG INJECTION N/A 03/14/2017   Procedure: KENALOG INJECTION;  Surgeon: Helayne Seminole, MD;  Location: [facility];  Service: ENT;  Laterality: N/A;  . MICROLARYNGOSCOPY WITH LASER N/A 03/14/2017   Procedure: MICROLARYNGOSCOPY WITH LASER;  Surgeon: Helayne Seminole, MD;  [facility];  Service: ENT;  Laterality: N/A;  . SHOULDER SURGERY     Family History:  Family History  Problem Relation Age of Onset  . Cancer Mother   . Breast cancer Mother   .  Cancer Father   . Breast cancer Sister   . Breast cancer Maternal Grandmother    Family Psychiatric  History: non-contributory Social History:  Social History   Substance and Sexual Activity  Alcohol Use No     Social History   Substance and Sexual Activity  Drug Use No    Social  History   Socioeconomic History  . Marital status: Single    Spouse name: Not on file  . Number of children: Not on file  . Years of education: Not on file  . Highest education level: Not on file  Occupational History  . Not on file  Social Needs  . Financial resource strain: Not on file  . Food insecurity    Worry: Not on file    Inability: Not on file  . Transportation needs    Medical: Not on file    Non-medical: Not on file  Tobacco Use  . Smoking status: Former Smoker    Packs/day: 2.00    Years: 41.00    Pack years: 82.00    Types: Cigarettes  . Smokeless tobacco: Never Used  Substance and Sexual Activity  . Alcohol use: No  . Drug use: No  . Sexual activity: Never  Lifestyle  . Physical activity    Days per week: Not on file    Minutes per session: Not on file  . Stress: Not on file  Relationships  . Social Herbalist on phone: Not on file    Gets together: Not on file    Attends religious service: Not on file    Active member of club or organization: Not on file    Attends meetings of clubs or organizations: Not on file    Relationship status: Not on file  Other Topics Concern  . Not on file  Social History Narrative   Admitted to [facility] daily   Alcohol none   No Advanced Directive.      Additional Social History:    Allergies:   Allergies  Allergen Reactions  . Amoxicillin Anaphylaxis and Other (See Comments)    Childhood allergy >> tolerated cephalosporins  Has patient had a PCN reaction causing immediate rash, facial/tongue/throat swelling, SOB or lightheadedness with hypotension: Yes Has patient had a PCN reaction causing severe rash involving mucus membranes or skin necrosis: No Has patient had a PCN reaction that required hospitalization No Has patient had a PCN reaction occurring within the last 10 years: No If all of the above answers are "NO", then may proceed with Cephalosporin use.  Marland Kitchen  Penicillins Anaphylaxis and Other (See Comments)    Childhood allergy Has patient had a PCN reaction causing immediate rash, facial/tongue/throat swelling, SOB or lightheadedness with hypotension: Yes Has patient had a PCN reaction causing severe rash involving mucus membranes or skin necrosis: No Has patient had a PCN reaction that required hospitalization No Has patient had a PCN reaction occurring within the last 10 years: No If all of the above answers are "NO", then may proceed with Cephalosporin use.   Marland Kitchen Phenytoin Other (See Comments)    = CNS disorder   . Keflex [Cephalexin] Diarrhea  . Dilaudid [Hydromorphone Hcl] Other (See Comments)    Reaction:  Psychosis   . Haldol [Haloperidol] Other (See Comments)    Reaction:  Psychosis   . Morphine And Related Other (See Comments)    Reaction:  Psychosis     Labs:  Results for orders placed or performed during the hospital encounter of 09/30/18 (from the past 48 hour(s))  CBC with Differential     Status: Abnormal   Collection Time: 09/30/18  8:25 PM  Result Value Ref Range   WBC 17.4 (H) 4.0 - 10.5 K/uL   RBC 4.24 3.87 - 5.11 MIL/uL   Hemoglobin 12.7 12.0 - 15.0 g/dL   HCT 40.7 36.0 - 46.0 %   MCV 96.0 80.0 - 100.0 fL   MCH 30.0 26.0 - 34.0 pg   MCHC 31.2 30.0 - 36.0 g/dL   RDW 13.2 11.5 - 15.5 %   Platelets 312 150 - 400 K/uL   nRBC 0.0 0.0 - 0.2 %   Neutrophils Relative % 78 %   Neutro Abs 13.5 (H) 1.7 - 7.7 K/uL   Lymphocytes Relative 14 %   Lymphs Abs 2.4 0.7 - 4.0 K/uL   Monocytes Relative 7 %   Monocytes Absolute 1.3 (H) 0.1 - 1.0 K/uL   Eosinophils Relative 0 %   Eosinophils Absolute 0.0 0.0 - 0.5 K/uL   Basophils Relative 0 %   Basophils Absolute 0.1 0.0 - 0.1 K/uL   Immature Granulocytes 1 %   Abs Immature Granulocytes 0.13 (H) 0.00 - 0.07 K/uL    Comment: Performed at [facility], [place] [address], [place], [place] [address]  Comprehensive metabolic panel     Status: Abnormal   Collection  Time: 09/30/18  8:25 PM  Result Value Ref Range   Sodium 140 135 - 145 mmol/L   Potassium 3.7 3.5 - 5.1 mmol/L   Chloride 103 98 - 111 mmol/L   CO2 27 22 - 32 mmol/L   Glucose, Bld 113 (H) 70 - 99 mg/dL   BUN 17 8 - 23 mg/dL   Creatinine, Ser 0.71 0.44 - 1.00 mg/dL   Calcium 8.8 (L) 8.9 - 10.3 mg/dL   Total Protein 6.8 6.5 - 8.1 g/dL   Albumin 3.7 3.5 - 5.0 g/dL   AST 17 15 - 41 U/L   ALT 18 0 - 44 U/L   Alkaline Phosphatase 70 38 - 126 U/L   Total Bilirubin 0.5 0.3 - 1.2 mg/dL   GFR calc non Af Amer >60 >60 mL/min   GFR calc Af Amer >60 >60 mL/min   Anion gap 10 5 - 15    Comment: Performed at [facility], [place] [address] [place], [place] [address]  Urinalysis, Routine w reflex microscopic     Status: Abnormal   Collection Time: 09/30/18  8:25 PM  Result Value Ref Range   Color, Urine YELLOW YELLOW   APPearance HAZY (A) CLEAR   Specific Gravity, Urine 1.028 1.005 - 1.030   pH 6.0 5.0 - 8.0   Glucose, UA NEGATIVE NEGATIVE mg/dL   Hgb urine dipstick NEGATIVE NEGATIVE   Bilirubin Urine NEGATIVE NEGATIVE   Ketones, ur 20 (A) NEGATIVE mg/dL   Protein, ur 30 (A) NEGATIVE mg/dL   Nitrite NEGATIVE NEGATIVE   Leukocytes,Ua NEGATIVE NEGATIVE   RBC / HPF 0-5 0 - 5 RBC/hpf   WBC, UA 6-10 0 - 5 WBC/hpf   Bacteria, UA NONE SEEN NONE SEEN   Squamous Epithelial / LPF 0-5 0 - 5   Mucus PRESENT    Hyaline Casts, UA PRESENT     Comment: Performed at [facility], [place] [address] [place], [place] [address]  Ammonia     Status: None   Collection Time: 09/30/18  8:25  PM  Result Value Ref Range   Ammonia 13 9 - 35 umol/L    Comment: Performed at [facility], [place] [address] [place], [place] [address]  Protime-INR     Status: None   Collection Time: 09/30/18  8:25 PM  Result Value Ref Range   Prothrombin Time 12.9 11.4 - 15.2 seconds   INR 1.0 0.8 - 1.2    Comment: (NOTE) INR goal varies based on device and disease  states. Performed at [facility], [place] [address] [place], [place] [address]   Valproic acid level     Status: Abnormal   Collection Time: 09/30/18  8:25 PM  Result Value Ref Range   Valproic Acid Lvl <10 (L) 50.0 - 100.0 ug/mL    Comment: RESULTS CONFIRMED BY MANUAL DILUTION Performed at [place] [address], [place], [place] [address]   Urine rapid drug screen (hosp performed)     Status: None   Collection Time: 09/30/18  8:25 PM  Result Value Ref Range   Opiates NONE DETECTED NONE DETECTED   Cocaine NONE DETECTED NONE DETECTED   Benzodiazepines NONE DETECTED NONE DETECTED   Amphetamines NONE DETECTED NONE DETECTED   Tetrahydrocannabinol NONE DETECTED NONE DETECTED   Barbiturates NONE DETECTED NONE DETECTED    Comment: (NOTE) DRUG SCREEN FOR MEDICAL PURPOSES ONLY.  IF CONFIRMATION IS NEEDED FOR ANY PURPOSE, NOTIFY LAB WITHIN 5 DAYS. LOWEST DETECTABLE LIMITS FOR URINE DRUG SCREEN Drug Class                     Cutoff (ng/mL) Amphetamine and metabolites    1000 Barbiturate and metabolites    200 Benzodiazepine                 601 Tricyclics and metabolites     300 Opiates and metabolites        300 Cocaine and metabolites        300 THC                            50 Performed at [facility], [place] [address] [place], [place] [address]   Ethanol     Status: None   Collection Time: 09/30/18  8:25 PM  Result Value Ref Range   Alcohol, Ethyl (B) <10 <10 mg/dL    Comment: (NOTE) Lowest detectable limit for serum alcohol is 10 mg/dL. For medical purposes only. Performed at [facility], [place] [address], [place], [place] [address]   CBG monitoring, ED     Status: Abnormal   Collection Time: 09/30/18  8:31 PM  Result Value Ref Range   Glucose-Capillary 105 (H) 70 - 99 mg/dL  SARS Coronavirus 2 (CEPHEID - Performed in [facility] hospital lab), Hosp Order     Status: None   Collection Time:  09/30/18  9:56 PM   Specimen: Nasopharyngeal Swab  Result Value Ref Range   SARS Coronavirus 2 NEGATIVE NEGATIVE    Comment: (NOTE) If result is NEGATIVE SARS-CoV-2 target nucleic acids are NOT DETECTED. The SARS-CoV-2 RNA is generally detectable in upper and lower  respiratory specimens during the acute phase of infection. The lowest  concentration of SARS-CoV-2 viral copies this assay can detect is 250  copies / mL. A negative result does not preclude SARS-CoV-2 infection  and should not be used as the sole basis for treatment or other  patient management decisions.  A negative result may occur with  improper specimen collection / handling, submission of specimen other  than nasopharyngeal swab, presence of viral mutation(s) within the  areas targeted by this assay, and inadequate number of viral copies  (<250 copies / mL). A negative result must be combined with clinical  observations, patient history, and epidemiological information. If result is POSITIVE SARS-CoV-2 target nucleic acids are DETECTED. The SARS-CoV-2 RNA is generally detectable in upper and lower  respiratory specimens dur ing the acute phase of infection.  Positive  results are indicative of active infection with SARS-CoV-2.  Clinical  correlation with patient history and other diagnostic information is  necessary to determine patient infection status.  Positive results do  not rule out bacterial infection or co-infection with other viruses. If result is PRESUMPTIVE POSTIVE SARS-CoV-2 nucleic acids MAY BE PRESENT.   A presumptive positive result was obtained on the submitted specimen  and confirmed on repeat testing.  While 2019 novel coronavirus  (SARS-CoV-2) nucleic acids may be present in the submitted sample  additional confirmatory testing may be necessary for epidemiological  and / or clinical management purposes  to differentiate between  SARS-CoV-2 and other Sarbecovirus currently known to infect humans.   If clinically indicated additional testing with an alternate test  methodology [phone]) is advised. The SARS-CoV-2 RNA is generally  detectable in upper and lower respiratory sp ecimens during the acute  phase of infection. The expected result is Negative. Fact Sheet for Patients:  StrictlyIdeas.no Fact Sheet for Healthcare Providers: BankingDealers.co.za This test is not yet approved or cleared by the [place] FDA and has been authorized for detection and/or diagnosis of SARS-CoV-2 by FDA under an Emergency Use Authorization (EUA).  This EUA will remain in effect (meaning this test can be used) for the duration of the COVID-19 declaration under Section 564(b)(1) of the Act, 21 U.S.C. section 360bbb-3(b)(1), unless the authorization is terminated or revoked sooner. Performed at [facility], [place] [address], [place], [place] [address]     Medications:  Current Facility-Administered Medications  Medication Dose Route Frequency Provider Last Rate Last Dose  . ARIPiprazole (ABILIFY) tablet 20 mg  20 mg Per Tube Daily Francine Graven, DO   20 mg at 10/02/18 0919  . aspirin EC tablet 81 mg  81 mg Oral Daily Francine Graven, DO   81 mg at 10/02/18 0919  . baclofen (LIORESAL) tablet 10 mg  10 mg Oral TID Francine Graven, DO   10 mg at 10/02/18 0919  . benztropine (COGENTIN) tablet 2 mg  2 mg Oral TID Francine Graven, DO   2 mg at 10/02/18 0919  . divalproex (DEPAKOTE) DR tablet 250 mg  250 mg Oral Q8H Francine Graven, DO   250 mg at 10/02/18 0511  . levETIRAcetam (KEPPRA) tablet 1,000 mg  1,000 mg Oral BID Francine Graven, DO   1,000 mg at 10/02/18 0919  . levothyroxine (SYNTHROID) tablet 112 mcg  112 mcg Oral Q0600 Francine Graven, DO   112 mcg at 10/02/18 0511  . LORazepam (ATIVAN) tablet 1 mg  1 mg Oral BID PRN Francine Graven, DO   1 mg at 10/01/18 2328  . Melatonin TABS 5 mg  5 mg Oral QHS PRN  Francine Graven, DO   5 mg at 10/01/18 2216  . nystatin (MYCOSTATIN/NYSTOP) topical powder   Topical BID Hayden Rasmussen, MD      . pantoprazole (PROTONIX) EC tablet 40 mg  40 mg Oral Daily Francine Graven, DO  40 mg at 10/02/18 0919  . QUEtiapine (SEROQUEL) tablet 25 mg  25 mg Oral QHS Francine Graven, DO   25 mg at 10/01/18 2109  . venlafaxine XR (EFFEXOR-XR) 24 hr capsule 150 mg  150 mg Oral Daily Francine Graven, DO   150 mg at 10/02/18 4540   Current Outpatient Medications  Medication Sig Dispense Refill  . ARIPiprazole (ABILIFY) 20 MG tablet Place 1 tablet (20 mg total) into feeding tube daily. 30 tablet 0  . ARIPiprazole ER (ABILIFY MAINTENA) 400 MG PRSY prefilled syringe Inject 400 mg into the muscle every 28 (twenty-eight) days.    Marland Kitchen aspirin EC 81 MG tablet Take 81 mg by mouth daily.    . baclofen (LIORESAL) 10 MG tablet Take 10 mg by mouth 3 (three) times daily.    . benztropine (COGENTIN) 2 MG tablet Take 1 tablet (2 mg total) by mouth 3 (three) times daily. 90 tablet 0  . divalproex (DEPAKOTE) 250 MG DR tablet Take 1 tablet (250 mg total) by mouth every 8 (eight) hours. 90 tablet 0  . ipratropium-albuterol (DUONEB) 0.5-2.5 (3) MG/3ML SOLN Take 3 mLs by nebulization 2 (two) times daily. (Patient taking differently: Take 3 mLs by nebulization every 6 (six) hours as needed (SOB, wheezing). ) 360 mL 0  . ketoconazole (NIZORAL) 2 % cream Apply 1 application topically 2 (two) times daily. (Patient taking differently: Apply 1 application topically 2 (two) times daily as needed for irritation. ) 60 g 1  . levETIRAcetam (KEPPRA) 1000 MG tablet Take 1 tablet (1,000 mg total) by mouth 2 (two) times daily. 60 tablet 0  . levothyroxine (SYNTHROID) 112 MCG tablet Take 112 mcg by mouth daily.    Marland Kitchen LORazepam (ATIVAN) 1 MG tablet Take 1 mg by mouth 2 (two) times daily as needed for anxiety.    . Melatonin 5 MG TABS Take 5 mg by mouth at bedtime as needed (sleep).    . meloxicam (MOBIC) 7.5  MG tablet Take 15 mg by mouth daily as needed for pain.    Marland Kitchen nystatin (NYSTATIN) powder Apply 4 g topically 2 (two) times daily as needed (yeast).    Marland Kitchen omeprazole (PRILOSEC) 20 MG capsule Take 20 mg by mouth daily.     . ondansetron (ZOFRAN-ODT) 4 MG disintegrating tablet Take 4 mg by mouth every 8 (eight) hours as needed for nausea or vomiting.    Marland Kitchen QUEtiapine (SEROQUEL) 25 MG tablet Take 25 mg by mouth at bedtime.    . traMADol (ULTRAM) 50 MG tablet Take 50 mg by mouth every 12 (twelve) hours as needed for moderate pain or severe pain.    . valACYclovir (VALTREX) 1000 MG tablet Take 1,000 mg by mouth 2 (two) times daily as needed (outbreak).    . venlafaxine XR (EFFEXOR-XR) 150 MG 24 hr capsule Take 150 mg by mouth daily.     . folic acid (FOLVITE) 1 MG tablet Place 1 tablet (1 mg total) into feeding tube daily. (Patient not taking: Reported on 10/01/2018) 30 tablet 0  . promethazine (PHENERGAN) 25 MG tablet Take 1 tablet (25 mg total) by mouth every 6 (six) hours as needed for up to 12 doses for nausea or vomiting. (Patient not taking: Reported on 10/01/2018) 12 tablet 0    Musculoskeletal: Strength & Muscle Tone: within normal limits Gait & Station: NA Patient leans: lying in bed  Psychiatric Specialty Exam: Physical Exam  Nursing note and vitals reviewed. Constitutional: She appears well-developed and well-nourished.  HENT:  Head:  Normocephalic and atraumatic.  Respiratory: Effort normal.  Neurological: She is alert.    ROS  Blood pressure 139/78, pulse 90, temperature 98.8 F (37.1 C), temperature source Oral, resp. rate 18, SpO2 94 %.There is no height or weight on file to calculate BMI.  General Appearance: Disheveled  Eye Contact:  Fair  Speech:  Normal Rate  Volume:  Increased  Mood:  Anxious, Dysphoric and Irritable  Affect:  Congruent  Thought Process:  Disorganized and Descriptions of Associations: Loose  Orientation:  Full (Time, Place, and Person)  Thought Content:   Delusions, Hallucinations: Auditory and Paranoid Ideation  Suicidal Thoughts:  No  Homicidal Thoughts:  No  Memory:  Immediate;   Poor Recent;   Poor Remote;   Poor  Judgement:  Impaired  Insight:  Lacking  Psychomotor Activity:  Increased  Concentration:  Concentration: Fair and Attention Span: Fair  Recall:  [employer] of Knowledge:  Fair  Language:  Fair  Akathisia:  Negative  Handed:  Right  AIMS (if indicated):     Assets:  Desire for Improvement Resilience  ADL's:  Intact  Cognition:  WNL  Sleep:        Treatment Plan Summary: Daily contact with patient to assess and evaluate symptoms and progress in treatment, Medication management and Plan : Patient is seen and examined. Patient is a 68 YO female with the above stated PPHx who is seen in the ED  Disposition: Recommend psychiatric Inpatient admission when medically cleared.   Patient with history of schizophrenia vs schizoaffective disorder who is currently psychotic with evidence of paranoia and at least auditory hallucinations. I am going to change some of her dosing around and make some recommendations. I do believe that she requires admission and if necessary place under involuntary commitment.  1) increase seroquel to 100 mg po q hs 2) seroquel 25 mg po now x 1 3) depakote level <10 so clearly non-compliant so continue at 250 mg po q 8 hours. 4) Needs TSH 5) repeat UA 6) IVC if necessary 7) geodon 10 mg IM q 12 hours prn severe agtiation  This service was provided via telemedicine using a 2-way, interactive audio and video technology.  Names of all persons participating in this telemedicine service and their role in this encounter. Name: Myles Lipps MD Role: Psychiatrist/Consultant             Sharma Covert, MD 10/02/2018 11:04 AM

## 2018-10-03 DIAGNOSIS — F209 Schizophrenia, unspecified: Secondary | ICD-10-CM | POA: Diagnosis not present

## 2018-10-03 DIAGNOSIS — R451 Restlessness and agitation: Secondary | ICD-10-CM

## 2018-10-03 DIAGNOSIS — R441 Visual hallucinations: Secondary | ICD-10-CM

## 2018-10-03 DIAGNOSIS — R440 Auditory hallucinations: Secondary | ICD-10-CM

## 2018-10-03 LAB — TSH: TSH: 11.085 u[IU]/mL — ABNORMAL HIGH (ref 0.350–4.500)

## 2018-10-03 LAB — CBC WITH DIFFERENTIAL/PLATELET
Abs Immature Granulocytes: 0.08 10*3/uL — ABNORMAL HIGH (ref 0.00–0.07)
Basophils Absolute: 0.1 10*3/uL (ref 0.0–0.1)
Basophils Relative: 1 %
Eosinophils Absolute: 0.2 10*3/uL (ref 0.0–0.5)
Eosinophils Relative: 2 %
HCT: 39.9 % (ref 36.0–46.0)
Hemoglobin: 12.2 g/dL (ref 12.0–15.0)
Immature Granulocytes: 1 %
Lymphocytes Relative: 31 %
Lymphs Abs: 3 10*3/uL (ref 0.7–4.0)
MCH: 29.3 pg (ref 26.0–34.0)
MCHC: 30.6 g/dL (ref 30.0–36.0)
MCV: 95.7 fL (ref 80.0–100.0)
Monocytes Absolute: 0.7 10*3/uL (ref 0.1–1.0)
Monocytes Relative: 7 %
Neutro Abs: 5.9 10*3/uL (ref 1.7–7.7)
Neutrophils Relative %: 58 %
Platelets: 251 10*3/uL (ref 150–400)
RBC: 4.17 MIL/uL (ref 3.87–5.11)
RDW: 13 % (ref 11.5–15.5)
WBC: 10 10*3/uL (ref 4.0–10.5)
nRBC: 0 % (ref 0.0–0.2)

## 2018-10-03 MED ORDER — OLANZAPINE 5 MG PO TBDP
5.0000 mg | ORAL_TABLET | Freq: Every day | ORAL | Status: AC
  Administered 2018-10-03: 5 mg via ORAL
  Filled 2018-10-03: qty 1

## 2018-10-03 MED ORDER — QUETIAPINE FUMARATE 100 MG PO TABS
100.0000 mg | ORAL_TABLET | Freq: Every day | ORAL | Status: DC
  Administered 2018-10-03: 100 mg via ORAL
  Filled 2018-10-03: qty 1

## 2018-10-03 MED ORDER — OLANZAPINE 2.5 MG PO TABS
2.5000 mg | ORAL_TABLET | Freq: Three times a day (TID) | ORAL | Status: DC | PRN
  Administered 2018-10-03 – 2018-10-05 (×2): 2.5 mg via ORAL
  Filled 2018-10-03 (×2): qty 1

## 2018-10-03 NOTE — ED Notes (Signed)
Pt alert with disorganized thoughts. Pt denies any pain or discomfort. Pt paranoid and easily redirectable. Pt denies any si or hi. Pt c/o visual and auditory hallucinations. Pt safe will continue to monitor.

## 2018-10-03 NOTE — ED Notes (Signed)
Pt labile, delusional, talking to ppl not there,  Yelling, uncooperative with redirection PRN Zyprexa 2.5 mg PO given.

## 2018-10-03 NOTE — Progress Notes (Signed)
Patient ID: Dominique Acosta, female   DOB: 01-12-1951, 68 y.o.   MRN: [id number] Patient is seen and examined. Patient with history of schizophrenia vs schizoaffective disorder. Patient is slightly better today with regards to behavioral disturbance. She remains with auditory and visual hallucinations. She did state that she slept better last night. She is A&O x 2-3 this am. BP is mildly elevated this am, WBC count down to 10 K this am.   1) schizophrenia vs schizoaffective disorder - still with hallucinations and intermittant agitation. We will increase her seroquel at q hs to 100 mg po. Continue abilify and depakote at current dosages. No change in ativan but attempt to keep at lowest dosage to avoid disinhibition.  2) TSH and UA    Myles Lipps MD

## 2018-10-03 NOTE — ED Notes (Signed)
Patient actively hallucinating pulling an unseen object out of her ear and saying "I've got pins in my ears."  Becoming more agitated speaking in a loud voice and being irritable towards staff.  Yelling now with paranoid delusions.  [name] notifed.  5mg  Zyprexa ordered po.

## 2018-10-03 NOTE — ED Notes (Signed)
Patient slightly less agitated however her agitation seems to still come and go.  She will rest for a few minutes then begin hallucinating again and talking to voices.

## 2018-10-03 NOTE — ED Notes (Signed)
Patient sleeping peacefully now after being highly agitated earlier.

## 2018-10-03 NOTE — BH Assessment (Addendum)
[facility] Assessment Progress Note  Per Myles Lipps, MD, this pt continues to require psychiatric hospitalization at a facility providing specialty geriatric care.  Pt remains under IVC initiated by EDP Francine Graven, DO.  The following facilities have been contacted to seek placement for this pt, with results as noted:  Beds available, information sent, decision pending:  [facility]    Declined:  [facility] (due to pt acuity) [facility] (due to pt acuity) [facility] (due to wrong insurance panel)   At capacity:  [place] [facility] [place], [place] Coordinator [phone]

## 2018-10-04 DIAGNOSIS — F209 Schizophrenia, unspecified: Secondary | ICD-10-CM | POA: Diagnosis not present

## 2018-10-04 LAB — CBC WITH DIFFERENTIAL/PLATELET
Abs Immature Granulocytes: 0.06 10*3/uL (ref 0.00–0.07)
Basophils Absolute: 0.1 10*3/uL (ref 0.0–0.1)
Basophils Relative: 1 %
Eosinophils Absolute: 0.2 10*3/uL (ref 0.0–0.5)
Eosinophils Relative: 2 %
HCT: 40.2 % (ref 36.0–46.0)
Hemoglobin: 12.6 g/dL (ref 12.0–15.0)
Immature Granulocytes: 1 %
Lymphocytes Relative: 24 %
Lymphs Abs: 2.2 10*3/uL (ref 0.7–4.0)
MCH: 30.1 pg (ref 26.0–34.0)
MCHC: 31.3 g/dL (ref 30.0–36.0)
MCV: 96.2 fL (ref 80.0–100.0)
Monocytes Absolute: 0.7 10*3/uL (ref 0.1–1.0)
Monocytes Relative: 8 %
Neutro Abs: 5.8 10*3/uL (ref 1.7–7.7)
Neutrophils Relative %: 64 %
Platelets: 265 10*3/uL (ref 150–400)
RBC: 4.18 MIL/uL (ref 3.87–5.11)
RDW: 13.2 % (ref 11.5–15.5)
WBC: 9.1 10*3/uL (ref 4.0–10.5)
nRBC: 0 % (ref 0.0–0.2)

## 2018-10-04 MED ORDER — OLANZAPINE 5 MG PO TBDP
5.0000 mg | ORAL_TABLET | Freq: Every day | ORAL | Status: DC
  Administered 2018-10-04: 5 mg via ORAL
  Filled 2018-10-04: qty 1

## 2018-10-04 MED ORDER — LORAZEPAM 1 MG PO TABS
2.0000 mg | ORAL_TABLET | Freq: Two times a day (BID) | ORAL | Status: DC | PRN
  Administered 2018-10-04 – 2018-10-05 (×2): 2 mg via ORAL
  Filled 2018-10-04 (×2): qty 2

## 2018-10-04 MED ORDER — OLANZAPINE 5 MG PO TBDP
15.0000 mg | ORAL_TABLET | Freq: Every day | ORAL | Status: DC
  Administered 2018-10-04: 15 mg via ORAL
  Filled 2018-10-04: qty 1

## 2018-10-04 MED ORDER — ZIPRASIDONE MESYLATE 20 MG IM SOLR
20.0000 mg | Freq: Once | INTRAMUSCULAR | Status: AC
  Administered 2018-10-04: 02:00:00 20 mg via INTRAMUSCULAR
  Filled 2018-10-04: qty 20

## 2018-10-04 MED ORDER — OLANZAPINE 10 MG IM SOLR
5.0000 mg | Freq: Three times a day (TID) | INTRAMUSCULAR | Status: DC | PRN
  Administered 2018-10-05: 08:00:00 5 mg via INTRAMUSCULAR
  Filled 2018-10-04: qty 10

## 2018-10-04 MED ORDER — STERILE WATER FOR INJECTION IJ SOLN
INTRAMUSCULAR | Status: AC
  Administered 2018-10-04: 02:00:00 1.2 mL
  Filled 2018-10-04: qty 10

## 2018-10-04 NOTE — BH Assessment (Signed)
[place] Assessment Progress Note  Per Myles Lipps, MD, this pt continues to requires psychiatric hospitalization at a geriatric specialty facility.  Pt remains under IVC.  At 16:29 Rondi calls from [facility].  Pt has been declined due to pt acuity.  At 16:32 this Probation officer returned a call to Angelica at [facility].  She reports that they will be willing to accept pt tomorrow.  She advises me to call tomorrow morning, at which time she will provide the name of the accepting physician and the phone number for report.  Pt's nurse, Eustaquio Maize, has been notified.  Jalene Mullet, [place] Coordinator [phone]

## 2018-10-04 NOTE — ED Notes (Signed)
Awake and agitated. Talking loudly and rapidly, hallucinating, referencing people in the room that aren't there. She is insisting tech gives her certain food and when told that specific food wasn't available, insisted it was and her family had it upstairs. Then said she is rescinding her family. Agitated and angry,. Gave her a large snack due to her insistence we feed her dinner and then after eating it all she spit up, but not vomit, spit her food out. Took her HS meds but told writer I was giving her a heart attack by giving her Risperdal which I didn't give her its not ordered, I gave her Zyprexa as ordered along with her other scheduled meds. Sitter is present with her and helped her clean her self up. Writer put Nystatin under both breast which are red and irritated and smells foul.  Will continue to monitor her for safety and needs.

## 2018-10-04 NOTE — ED Notes (Signed)
Pt delusional. Pt talking to people not there.  Pt given Ativan 2 mg at 1338, effective.

## 2018-10-04 NOTE — ED Notes (Signed)
Pt is hallucinating.  She is irritable and very loud.

## 2018-10-04 NOTE — ED Notes (Signed)
Pt yelling and verbally assaulting staff towards staff. Pt responding to internal stimuli. Pt refused to stay in room. Pt disorganized and agitated. Dr. Wyvonnia Dusky made aware, Geodon 20 mg given in right deltoid.

## 2018-10-04 NOTE — Progress Notes (Signed)
CSW called on behalf of Disposition Staff person, Tonette Bihari, and spoke to [place], [place].  She was able to access all documents sent to her earlier and is reviewing.  She will call back to either [facility] Disposition Office or [facility] Disposition CSW.  Areatha Keas. Judi Cong, MSW, [facility] Clinical Social Work [phone] (cell) [phone] (office)

## 2018-10-04 NOTE — Progress Notes (Signed)
Patient ID: Dominique Acosta, female   DOB: Mar 05, 1951, 68 y.o.   MRN: [id number] Patient is seen and examined. Patient is a 68 YO female with PPHx significant for schizophrenia vs schizoaffective disorder. Patient remains with psychosis and periods of significant agitation. She required IM geodon early this am. She has periods of yelling and disruptive behavior.  She remains paranoid and as well admitted to auditory as well as visual hallucinations. She will be switched from seroquel to zyprexa and see if stabilizes her mood and psychotic symptoms more effectively.  A/P 1) schizophrenia - Stop Seroquel 2) zyprexa 5 mg po q day and 15 mg po q hs 3) Zyprexa 2.5 mg po q 8 hours prn agitation 4) zyprexa 5 mg IM q 8 hours prn agiation 5) stop cogentin 6) given TSH at 11 continue synthroid at current dosage 7) no change in depakote at this point 8) Continue search for gero-psych

## 2018-10-05 DIAGNOSIS — F209 Schizophrenia, unspecified: Secondary | ICD-10-CM | POA: Diagnosis not present

## 2018-10-05 MED ORDER — LORAZEPAM 1 MG PO TABS: 2.0000 mg | ORAL_TABLET | ORAL | Status: DC | PRN

## 2018-10-05 MED ORDER — OLANZAPINE 10 MG PO TBDP: 20.0000 mg | ORAL_TABLET | Freq: Every day | ORAL | Status: DC

## 2018-10-05 MED ORDER — OLANZAPINE 10 MG PO TBDP
10.0000 mg | ORAL_TABLET | Freq: Every day | ORAL | Status: DC
  Administered 2018-10-05: 12:00:00 10 mg via ORAL
  Filled 2018-10-05: qty 1

## 2018-10-05 MED ORDER — STERILE WATER FOR INJECTION IJ SOLN
INTRAMUSCULAR | Status: AC
  Filled 2018-10-05: qty 10

## 2018-10-05 MED ORDER — DIVALPROEX SODIUM 500 MG PO DR TAB: 500.0000 mg | DELAYED_RELEASE_TABLET | Freq: Two times a day (BID) | ORAL | Status: DC

## 2018-10-05 MED ORDER — ZIPRASIDONE MESYLATE 20 MG IM SOLR
20.0000 mg | Freq: Four times a day (QID) | INTRAMUSCULAR | Status: DC | PRN
  Administered 2018-10-05: 09:00:00 20 mg via INTRAMUSCULAR
  Filled 2018-10-05: qty 20

## 2018-10-05 NOTE — ED Notes (Signed)
No marked change after giving her 2.5mg  of Zyprexa for agitation. She continues to respond to internal stimuli, referring to people in the room as she talks though no one present. At times becomes very loud when talking to hallucinations and then will quiet down but never stops talking. Talking about witch craft at times this am. She is med compliant. Sitter outside of doorway monitoring for safety.

## 2018-10-05 NOTE — ED Notes (Signed)
Sheriff reported it would be several hours before they could transport her.

## 2018-10-05 NOTE — ED Notes (Signed)
Report called to [place] RN.  Sheriff called for transport.

## 2018-10-05 NOTE — ED Notes (Addendum)
Patient extremely psychotic this morning.  Patient yelling at staff, threatening staff, throwing food and her styrofoam dishes.  Threw a cup of water on this writer, but then she did put the ativan in her mouth and took it.  Previously patient was willing to take medication but was uncooperative with injection today.  Jerked away when nurse tried to administer it.  Administration successful after several attempts.  Security on standby.  Patient actively hallucinating and delusional

## 2018-10-05 NOTE — ED Notes (Signed)
Woke up talking to self loudly and agitated. Writer closed her door and she said not to I would go to hell and she would open it. She did open it and said she would be quiet and that I knew what was going on and her dad was bribing me with money. Will give her prn for agitation.

## 2018-10-05 NOTE — Progress Notes (Signed)
Patient ID: Dominique Acosta, female   DOB: Apr 11, 1951, 69 y.o.   MRN: [id number] Patient is seen and examined. Patient is still psychotic and agitated. Still not sleeping. Has been accepted at [facility]. Will increase zyprexa and give geodon to decrease agitation prior to transport.

## 2018-10-05 NOTE — ED Notes (Signed)
Pt off unit to [facility] per provider. DC information given to sheriff for [facility]. Pt belongings given to sheriff for [facility]. Pt off unit in w/c. Pt transported by sheriff.

## 2018-10-05 NOTE — ED Notes (Signed)
Patient continues to yell at tech.  Will not rest in bed but for short periods of time, then she gets up to the chair and nods out in the chair.  Breast excoriation worse for day before yesterday when this writer treated her with nystatin powder.  Patient did lie down and let nurse apply nystatin powder today under both breasts.

## 2018-10-05 NOTE — ED Notes (Signed)
Patient waiting to be transferred to [facility] geri-psych unit.

## 2018-10-05 NOTE — BH Assessment (Addendum)
[place] Assessment Progress Note  At 52:47 Angelica at [facility] reports that pt has been accepted to their geriatric psychiatry unit, Rm 9450 bed 2, by Dr Shawnee Knapp.  Pt may arrive after 11:00.  Please call report to [phone].  Pt is under IVC and would need to be transported by [facility].  These details will be discussed with Myles Lipps, MD, after which this writer will notify pt's nurse.  Jalene Mullet, [place] [facility] Coordinator [phone]   Addendum:  Dr Mallie Darting concurs with this decision.  Pt's nurse, Caren Griffins, has been notified.  Jalene Mullet, [place] Coordinator [phone]

## 2018-12-20 ENCOUNTER — Encounter (HOSPITAL_COMMUNITY): Payer: Self-pay | Admitting: Cardiology

## 2018-12-28 ENCOUNTER — Telehealth (HOSPITAL_COMMUNITY): Payer: Self-pay

## 2018-12-28 NOTE — Telephone Encounter (Signed)
New message    Just an FYI. We have made several attempts to contact this patient including sending a letter to schedule or reschedule their echocardiogram. We will be removing the patient from the echo WQ.   9.10.20 mail reminder letter Dominique Acosta  8.10.20 @ 3:08pm lm on home vm - Dominique Acosta 7.23.20 @ 12:37pm lm on home vm - Dominique Acosta

## 2018-12-31 NOTE — Telephone Encounter (Signed)
LM for pt to call back re: having her Echo

## 2019-01-08 NOTE — Telephone Encounter (Signed)
Many messages have been left for pt to c/b to schedule echo.  She has not done so at this time.  Will close this encounter and await return communication from her.

## 2019-01-31 ENCOUNTER — Telehealth: Payer: Self-pay | Admitting: Cardiology

## 2019-01-31 NOTE — Telephone Encounter (Signed)
I spoke with Dominique Acosta she is calling to see if Dr Marlou Porch would sign plan of care orders from 5/7-7/5.  Pt was only seen through 6/8.  She did not follow up with primary care.  I told Dominique Acosta that several notes in chart indicate Dr Marlou Porch would not be able to sign these notes. Cheryl requests I ask Dr Marlou Porch again about signing these orders.

## 2019-01-31 NOTE — Telephone Encounter (Signed)
Message with information from Dr Marlou Porch left on [employer]

## 2019-01-31 NOTE — Telephone Encounter (Signed)
New message   Per Jenny Reichmann need to get an order signed for her plan of care order. Please call.

## 2019-01-31 NOTE — Telephone Encounter (Signed)
I did not order.  I am unable to sign these orders. Please have the ordering MD sign.  Thank you.  Candee Furbish, MD

## 2019-05-13 DEATH — deceased
